# Patient Record
Sex: Male | Born: 1940 | Race: White | Hispanic: No | Marital: Married | Smoking: Current every day smoker
Health system: Southern US, Community
[De-identification: ages and names within clinical notes are randomized; demographics above are authoritative.]

## PROBLEM LIST (undated history)

## (undated) DIAGNOSIS — I2510 Atherosclerotic heart disease of native coronary artery without angina pectoris: Secondary | ICD-10-CM

## (undated) DIAGNOSIS — M79606 Pain in leg, unspecified: Secondary | ICD-10-CM

## (undated) DIAGNOSIS — H409 Unspecified glaucoma: Secondary | ICD-10-CM

## (undated) DIAGNOSIS — Z972 Presence of dental prosthetic device (complete) (partial): Secondary | ICD-10-CM

## (undated) DIAGNOSIS — J849 Interstitial pulmonary disease, unspecified: Secondary | ICD-10-CM

## (undated) DIAGNOSIS — M1990 Unspecified osteoarthritis, unspecified site: Secondary | ICD-10-CM

## (undated) DIAGNOSIS — I771 Stricture of artery: Secondary | ICD-10-CM

## (undated) DIAGNOSIS — J189 Pneumonia, unspecified organism: Secondary | ICD-10-CM

## (undated) DIAGNOSIS — H5440 Blindness, one eye, unspecified eye: Secondary | ICD-10-CM

## (undated) DIAGNOSIS — M5126 Other intervertebral disc displacement, lumbar region: Secondary | ICD-10-CM

## (undated) DIAGNOSIS — M51369 Other intervertebral disc degeneration, lumbar region without mention of lumbar back pain or lower extremity pain: Secondary | ICD-10-CM

## (undated) DIAGNOSIS — E785 Hyperlipidemia, unspecified: Secondary | ICD-10-CM

## (undated) DIAGNOSIS — I714 Abdominal aortic aneurysm, without rupture: Secondary | ICD-10-CM

## (undated) DIAGNOSIS — R0601 Orthopnea: Secondary | ICD-10-CM

## (undated) DIAGNOSIS — J449 Chronic obstructive pulmonary disease, unspecified: Secondary | ICD-10-CM

## (undated) DIAGNOSIS — C4490 Unspecified malignant neoplasm of skin, unspecified: Secondary | ICD-10-CM

## (undated) DIAGNOSIS — F419 Anxiety disorder, unspecified: Secondary | ICD-10-CM

## (undated) DIAGNOSIS — J302 Other seasonal allergic rhinitis: Secondary | ICD-10-CM

## (undated) DIAGNOSIS — N179 Acute kidney failure, unspecified: Secondary | ICD-10-CM

## (undated) DIAGNOSIS — R0600 Dyspnea, unspecified: Secondary | ICD-10-CM

## (undated) DIAGNOSIS — Z87442 Personal history of urinary calculi: Secondary | ICD-10-CM

## (undated) DIAGNOSIS — Z8719 Personal history of other diseases of the digestive system: Secondary | ICD-10-CM

## (undated) DIAGNOSIS — K7689 Other specified diseases of liver: Secondary | ICD-10-CM

## (undated) DIAGNOSIS — N201 Calculus of ureter: Secondary | ICD-10-CM

## (undated) DIAGNOSIS — Z720 Tobacco use: Secondary | ICD-10-CM

## (undated) DIAGNOSIS — R911 Solitary pulmonary nodule: Secondary | ICD-10-CM

## (undated) DIAGNOSIS — N3289 Other specified disorders of bladder: Secondary | ICD-10-CM

## (undated) DIAGNOSIS — M5136 Other intervertebral disc degeneration, lumbar region: Secondary | ICD-10-CM

## (undated) DIAGNOSIS — Z973 Presence of spectacles and contact lenses: Secondary | ICD-10-CM

## (undated) HISTORY — DX: Pain in leg, unspecified: M79.606

## (undated) HISTORY — PX: TONSILLECTOMY: SUR1361

## (undated) HISTORY — PX: CATARACT EXTRACTION W/ INTRAOCULAR LENS  IMPLANT, BILATERAL: SHX1307

## (undated) HISTORY — DX: Stricture of artery: I77.1

## (undated) HISTORY — DX: Chronic obstructive pulmonary disease, unspecified: J44.9

## (undated) HISTORY — DX: Anxiety disorder, unspecified: F41.9

## (undated) HISTORY — PX: HERNIA REPAIR: SHX51

## (undated) HISTORY — DX: Hyperlipidemia, unspecified: E78.5

---

## 1997-10-28 ENCOUNTER — Ambulatory Visit (HOSPITAL_COMMUNITY): Admission: RE | Admit: 1997-10-28 | Discharge: 1997-10-28 | Payer: Self-pay | Admitting: *Deleted

## 1997-11-10 ENCOUNTER — Ambulatory Visit (HOSPITAL_COMMUNITY): Admission: RE | Admit: 1997-11-10 | Discharge: 1997-11-10 | Payer: Self-pay | Admitting: *Deleted

## 2000-11-08 ENCOUNTER — Encounter: Payer: Self-pay | Admitting: Family Medicine

## 2000-11-08 ENCOUNTER — Ambulatory Visit (HOSPITAL_COMMUNITY): Admission: RE | Admit: 2000-11-08 | Discharge: 2000-11-08 | Payer: Self-pay | Admitting: Family Medicine

## 2000-11-09 ENCOUNTER — Ambulatory Visit (HOSPITAL_COMMUNITY): Admission: EM | Admit: 2000-11-09 | Discharge: 2000-11-09 | Payer: Self-pay | Admitting: Emergency Medicine

## 2000-11-09 ENCOUNTER — Encounter: Payer: Self-pay | Admitting: Emergency Medicine

## 2000-11-15 ENCOUNTER — Ambulatory Visit (HOSPITAL_COMMUNITY): Admission: RE | Admit: 2000-11-15 | Discharge: 2000-11-15 | Payer: Self-pay | Admitting: Urology

## 2000-11-15 ENCOUNTER — Encounter: Payer: Self-pay | Admitting: Urology

## 2000-11-20 ENCOUNTER — Encounter: Payer: Self-pay | Admitting: Family Medicine

## 2000-11-20 ENCOUNTER — Ambulatory Visit (HOSPITAL_COMMUNITY): Admission: RE | Admit: 2000-11-20 | Discharge: 2000-11-20 | Payer: Self-pay | Admitting: Family Medicine

## 2000-11-30 ENCOUNTER — Ambulatory Visit (HOSPITAL_COMMUNITY): Admission: RE | Admit: 2000-11-30 | Discharge: 2000-11-30 | Payer: Self-pay | Admitting: Urology

## 2000-11-30 ENCOUNTER — Encounter: Payer: Self-pay | Admitting: Urology

## 2000-11-30 HISTORY — PX: OTHER SURGICAL HISTORY: SHX169

## 2000-12-06 ENCOUNTER — Ambulatory Visit (HOSPITAL_COMMUNITY): Admission: RE | Admit: 2000-12-06 | Discharge: 2000-12-06 | Payer: Self-pay | Admitting: Urology

## 2000-12-06 ENCOUNTER — Encounter: Payer: Self-pay | Admitting: Urology

## 2000-12-10 ENCOUNTER — Inpatient Hospital Stay (HOSPITAL_COMMUNITY): Admission: AD | Admit: 2000-12-10 | Discharge: 2000-12-12 | Payer: Self-pay | Admitting: Urology

## 2000-12-31 ENCOUNTER — Ambulatory Visit (HOSPITAL_BASED_OUTPATIENT_CLINIC_OR_DEPARTMENT_OTHER): Admission: RE | Admit: 2000-12-31 | Discharge: 2000-12-31 | Payer: Self-pay | Admitting: *Deleted

## 2000-12-31 HISTORY — PX: OTHER SURGICAL HISTORY: SHX169

## 2007-04-04 HISTORY — PX: URETEROLITHOTOMY: SHX71

## 2007-07-29 IMAGING — CT CT HEAD W/O CM - CT MAXILLOFACIAL W/O CM
3 series · 17 of 47 positions shown, 20 images · non-contrast
Comparison: NONE

CLINICAL DATA: Congestion.  Evaluate for sinusitis. 

CT PARANASAL SINUSES
TECHNIQUE: Axial, sagittal, and coronal images were obtained.

[Series 3: 3x3 · axial · 0.35mm/px · z∈[+664,+778]mm · 11 of 46 slices shown, 14 images]
[im 4/46  brain]
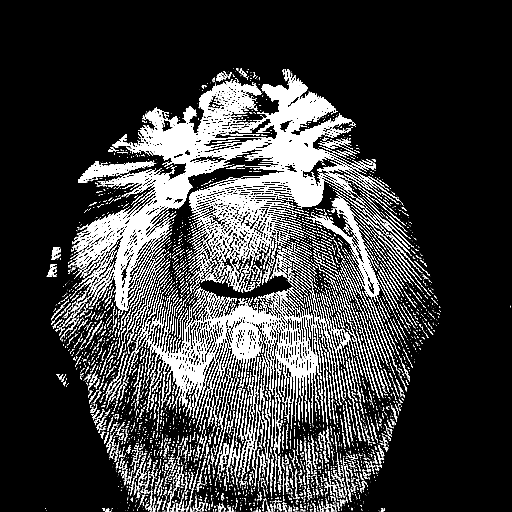
[im 4/46  bone]
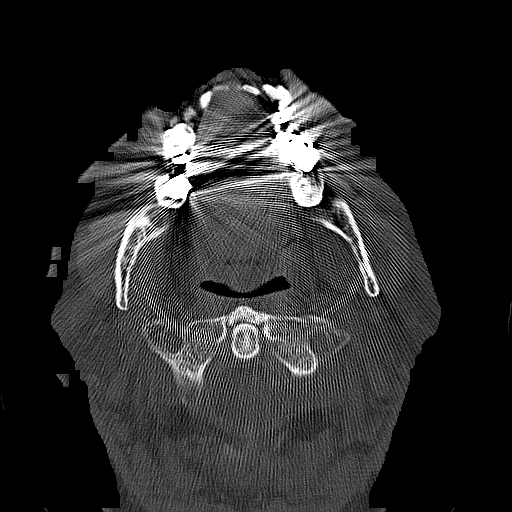
[im 7/46  bone]
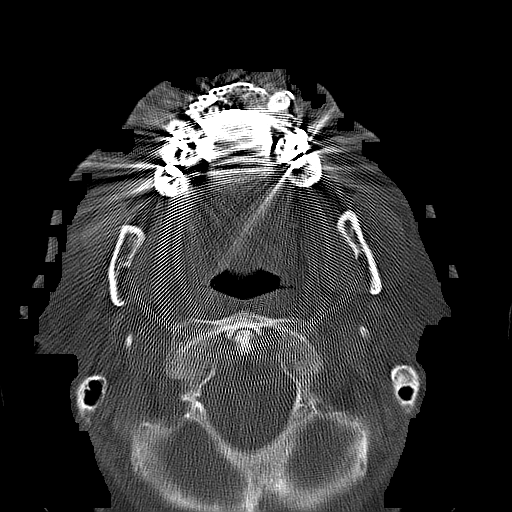
[im 11/46  bone]
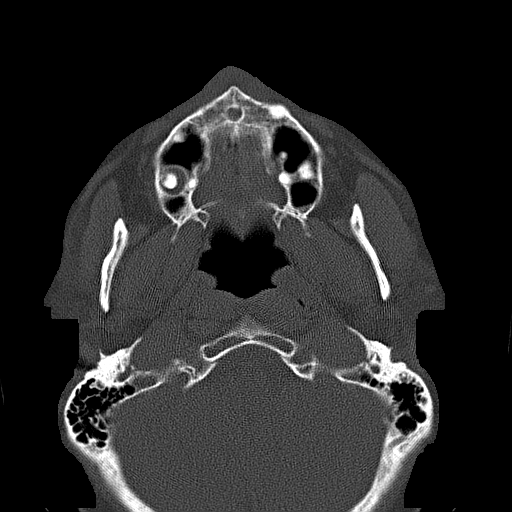
[im 14/46  bone]
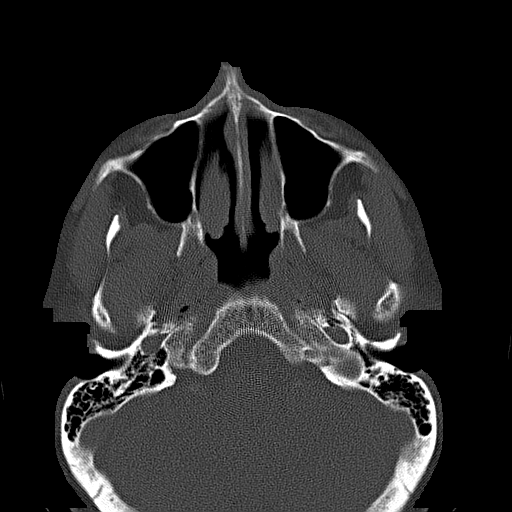
[im 19/46  brain]
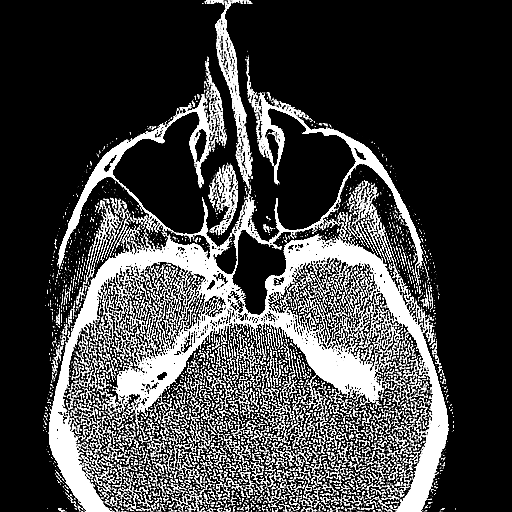
[im 19/46  bone]
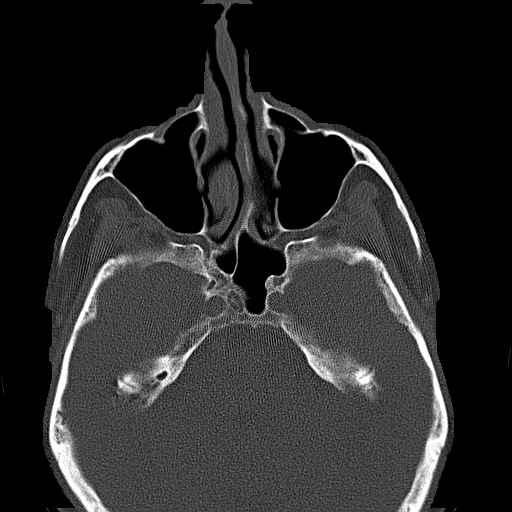
[im 22/46  bone]
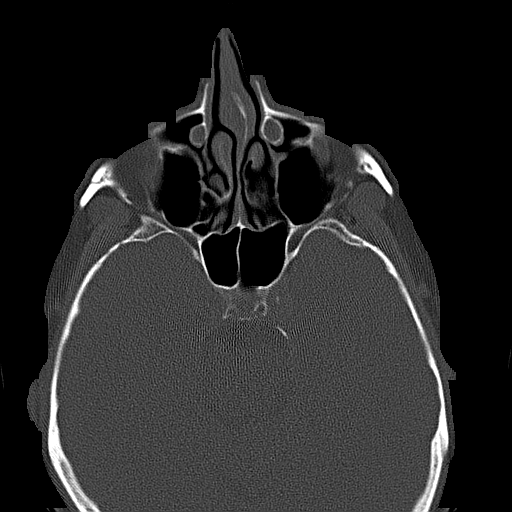
[im 25/46  bone]
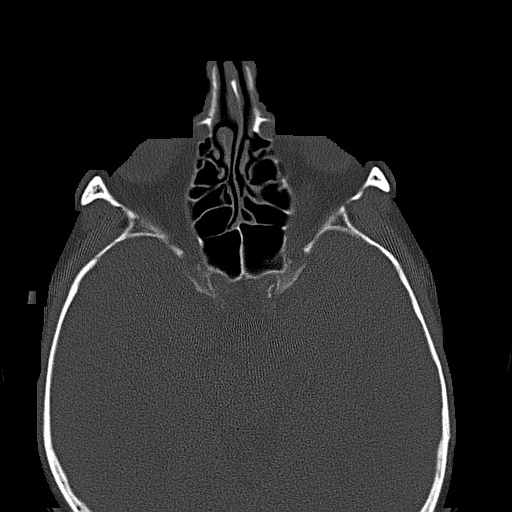
[im 30/46  bone]
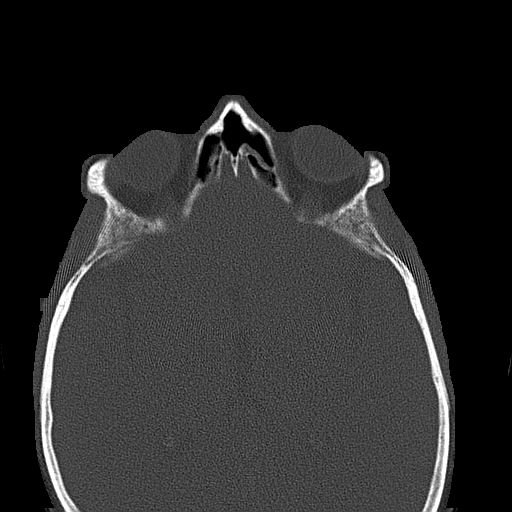
[im 33/46  brain]
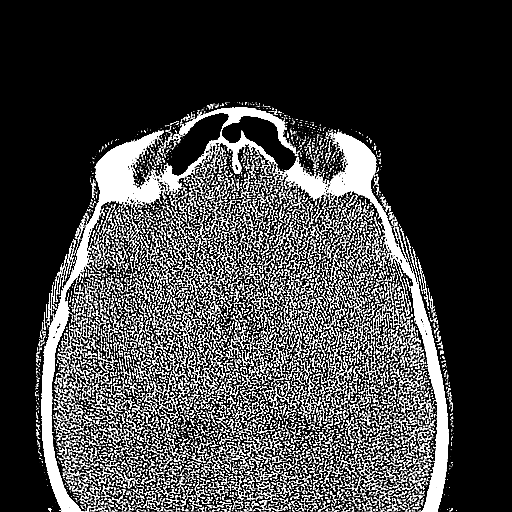
[im 33/46  bone]
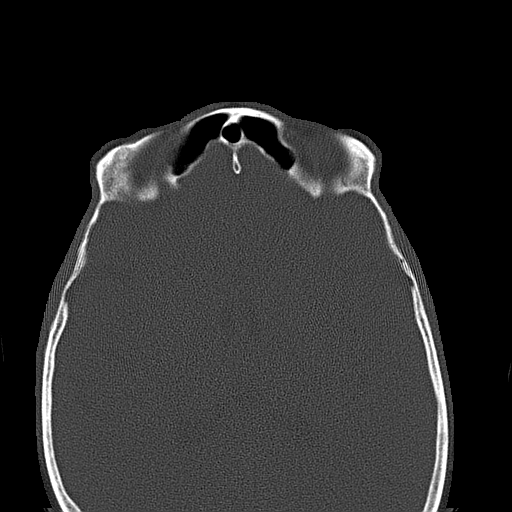
[im 38/46  bone]
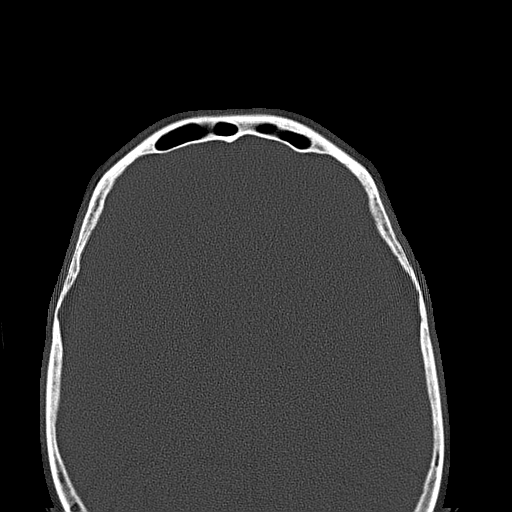
[im 42/46  bone]
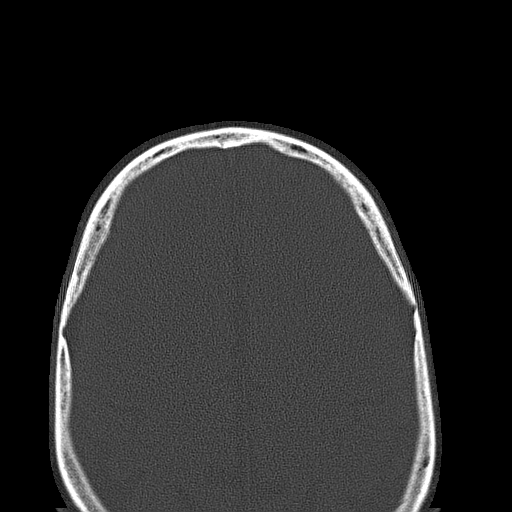

[coronals · coronal · 0.35mm/px · 3 of 28 slices shown]
[im 10/28  bone]
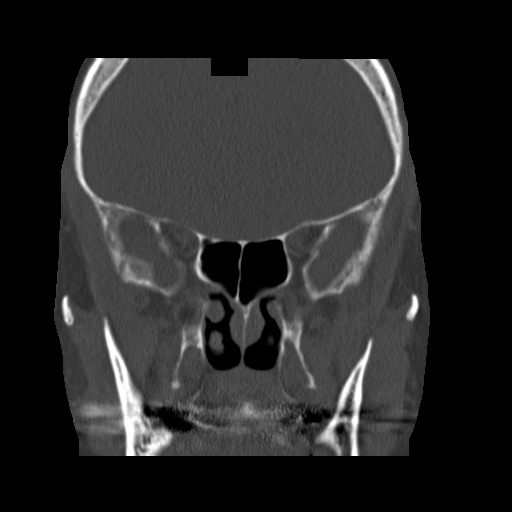
[im 13/28  bone]
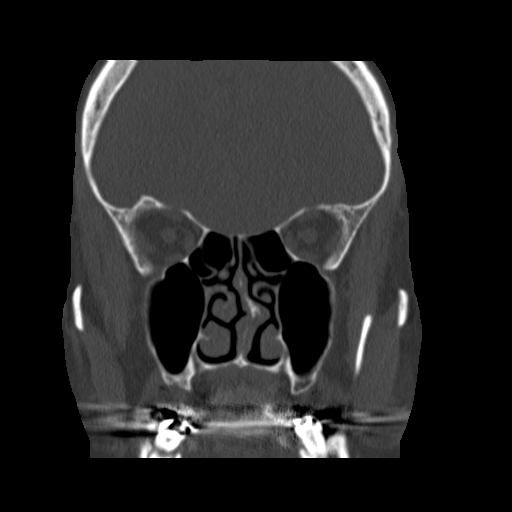
[im 16/28  bone]
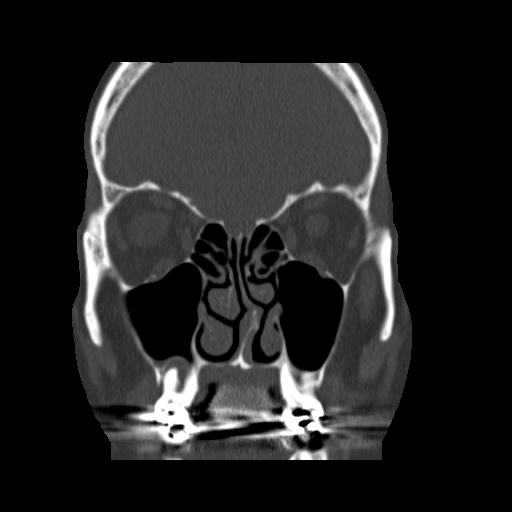

[sagittals · sagittal · 0.35mm/px · 3 of 33 slices shown]
[im 11/33  bone]
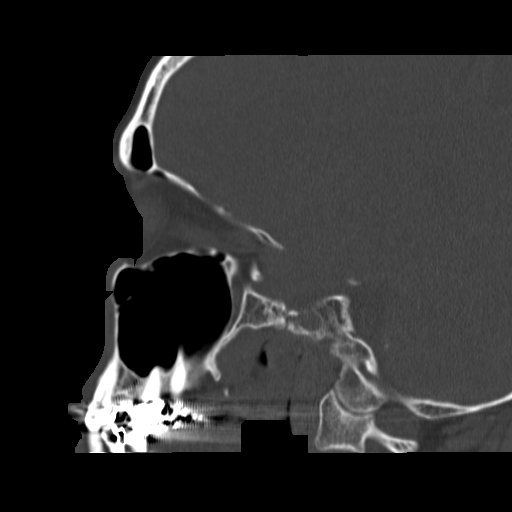
[im 17/33  bone]
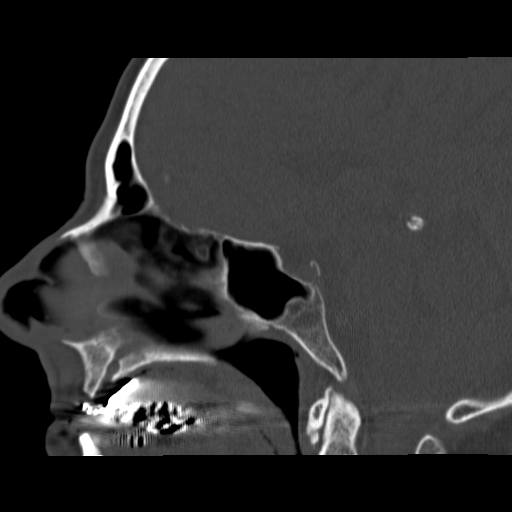
[im 22/33  bone]
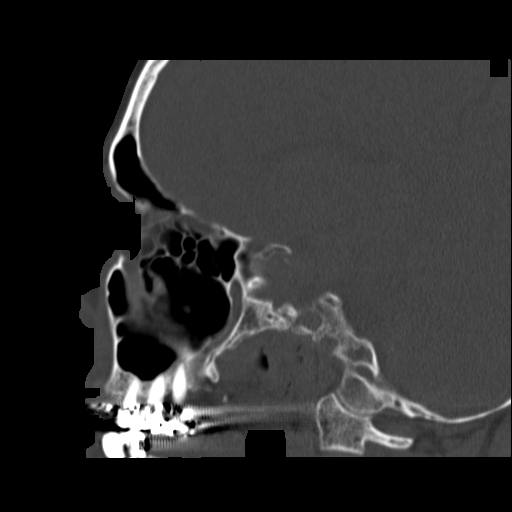

[17 of 47 positions shown; findings below may reference images not displayed]

FINDINGS: There is a small dentigerous cyst in the right 
maxillary sinus arising at the level of the 3rd molar.  This 
measures approximately 8-9 mm in size. There is no evidence of 
mucosal thickening or air-fluid level in the paranasal sinuses.  
The orbits and mastoids are unremarkable.  There is no evidence of 
nasopharyngeal mass. There is leftward nasal septal deviation 
without occlusion or polyposis.
IMPRESSION: No evidence of sinusitis.  Small dentigerous cyst on 
the right at the level of the 3rd molar. ANICA 
electronically reviewed on [DATE] Dict Date: [DATE]  Tran 
Date:  [DATE] DAS  JLM

## 2008-06-23 ENCOUNTER — Ambulatory Visit: Payer: Self-pay | Admitting: Urology

## 2008-06-23 IMAGING — CT CT ABD-PELV W/O CM
1 of 2 series · 14 of 32 positions shown, 18 images · non-contrast
Comparison: none

REASON FOR EXAM: hematuria
COMMENTS:

[Series 2: soft tissue · axial · 0.67mm/px · z∈[-1285,-898]mm · 14 of 141 slices shown, 18 images]
[im 6/141  soft-tissue]
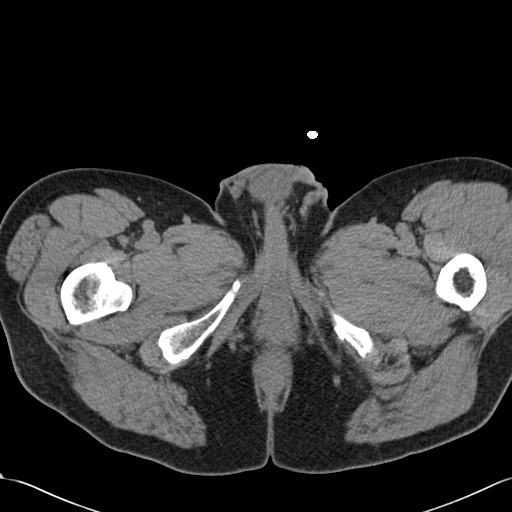
[im 6/141  bone]
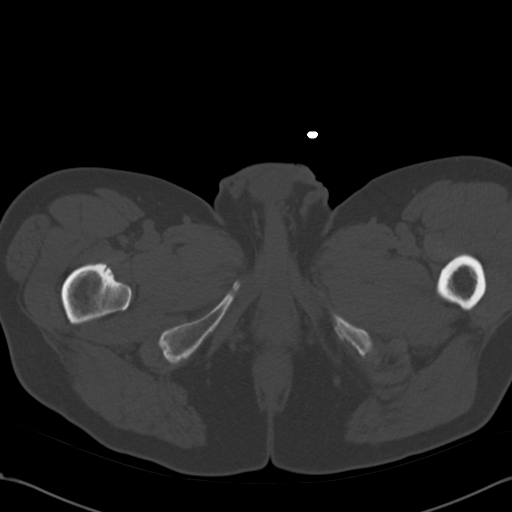
[im 18/141  soft-tissue]
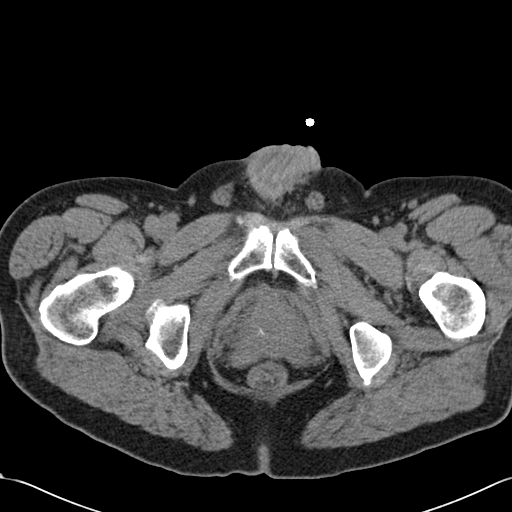
[im 30/141  soft-tissue]
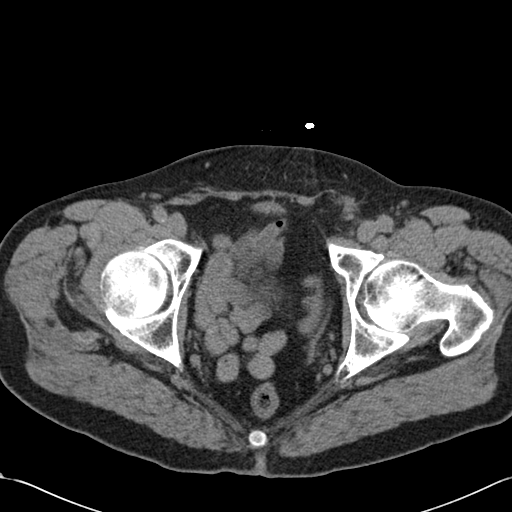
[im 41/141  soft-tissue]
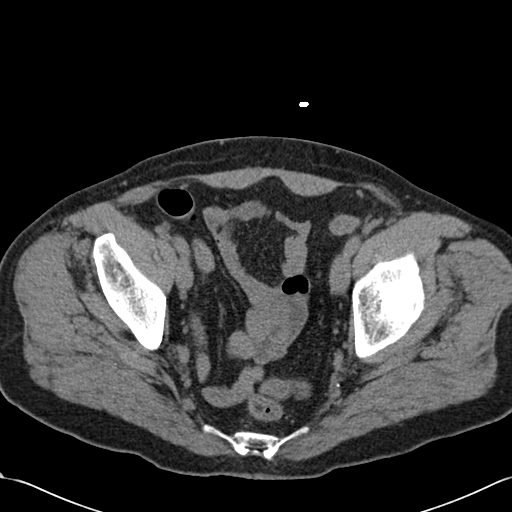
[im 53/141  soft-tissue]
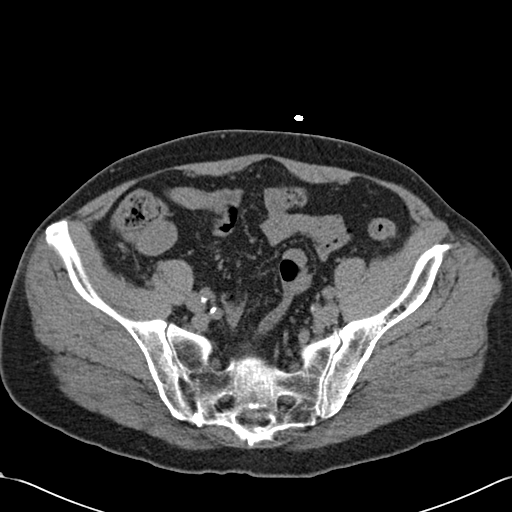
[im 65/141  soft-tissue]
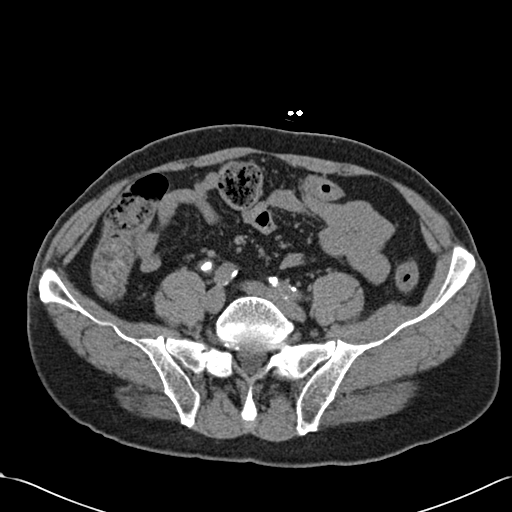
[im 76/141  soft-tissue]
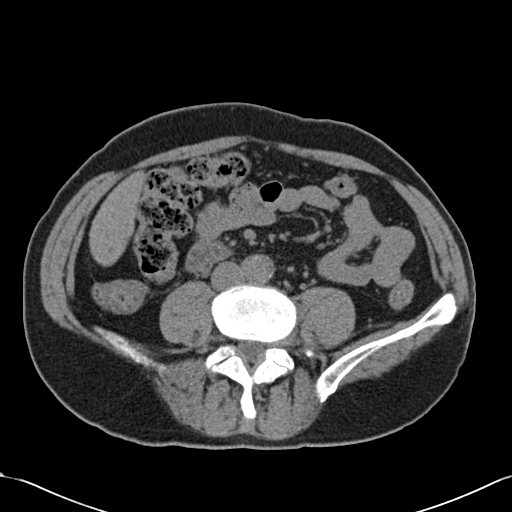
[im 88/141  soft-tissue]
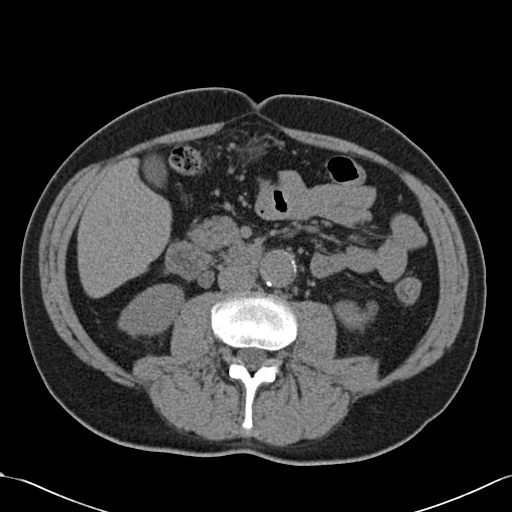
[im 100/141  soft-tissue]
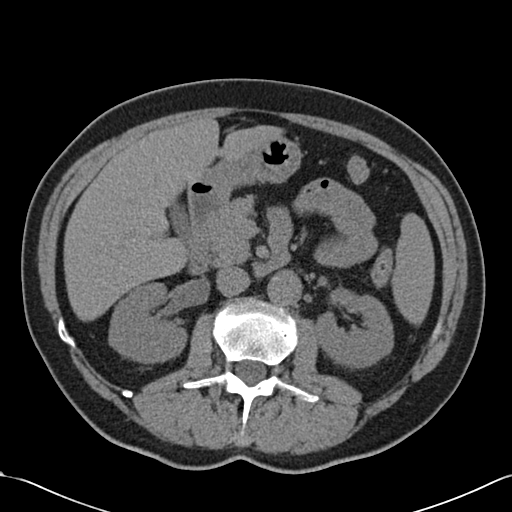
[im 100/141  bone]
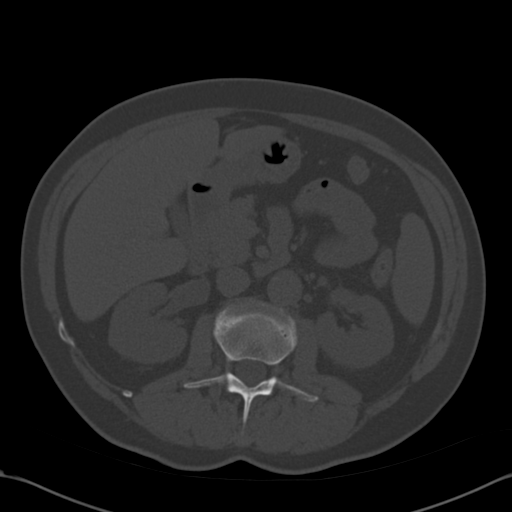
[im 111/141  soft-tissue]
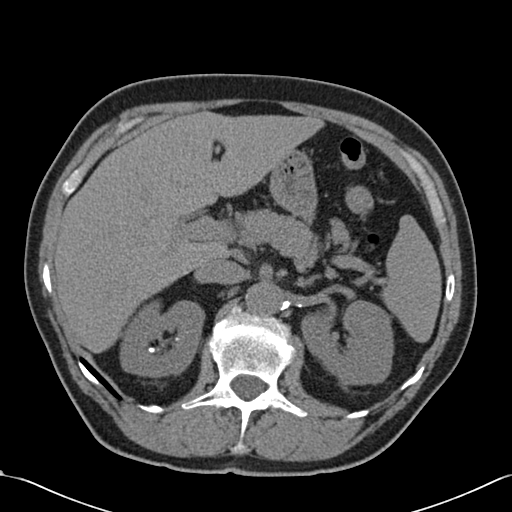
[im 117/141  lung]
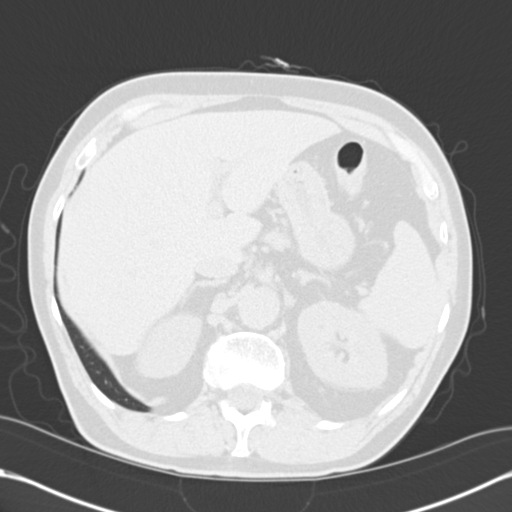
[im 123/141  soft-tissue]
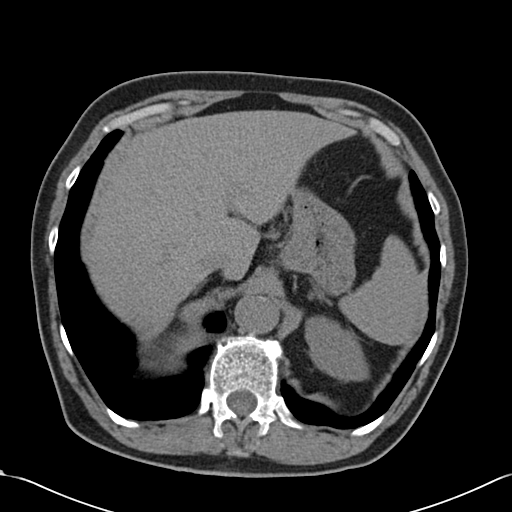
[im 123/141  lung]
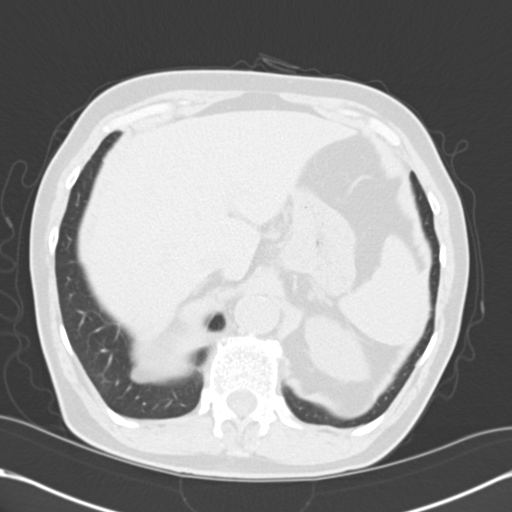
[im 129/141  lung]
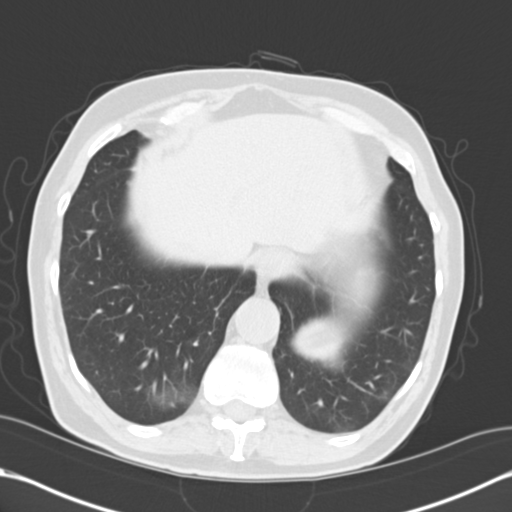
[im 135/141  soft-tissue]
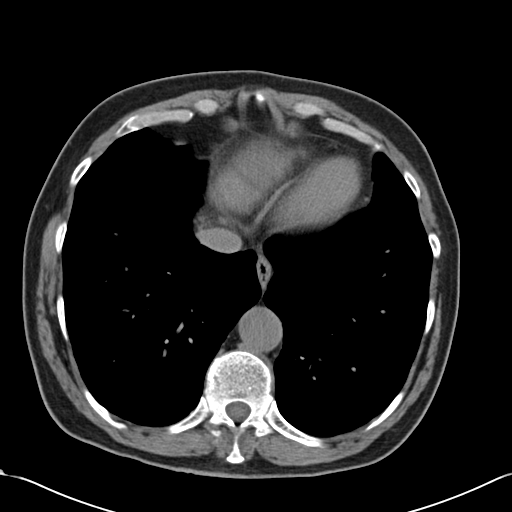
[im 135/141  lung]
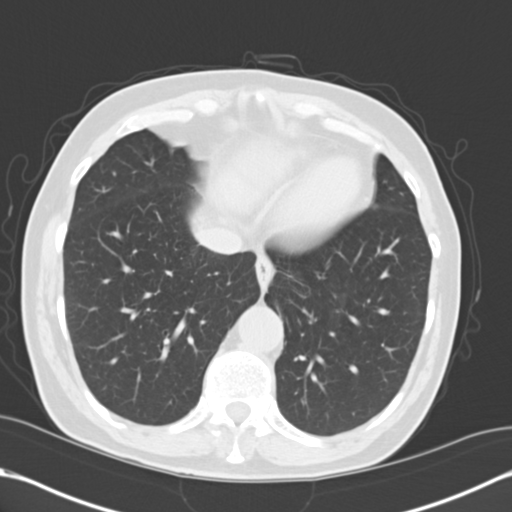

[14 of 32 positions shown; findings below may reference images not displayed]

PROCEDURE:     CT  - CT ABDOMEN AND PELVIS W[DATE] [DATE]

RESULT:     Axial CT scanning was performed through the abdomen and pelvis
at 3 mm intervals and slice thicknesses without contrast. Review of
3-dimensional reconstructed images was performed separately on the [employer]
Server monitor.

There are calcified stones within both kidneys. On the right there are 3
stones in an upper pole calyx measuring between 2 and 3 mm in diameter. In a
lower pole calyx there is an approximately 1 mm diameter stone. On the left
there are 2 calcifications in a posterior medial mid pole calyx with the
largest measuring approximately 3 mm in diameter and the smaller measuring 1
mm in diameter or less. In a lower pole calyx there are 2 stones measuring
less than 1 mm in diameter.The perinephric fat is normal in appearance.

Along the expected course of the ureters I do not see abnormal
calcifications on the left but on the right there is a midureteral stone
measuring as much is 8 mm in diameter demonstrated best on image 79. This is
not producing high-grade obstruction. I cannot absolutely exclude additional
smaller stones more inferiorly positioned but there are nearby arterial
calcifications in the internal and external iliac branches. The prostate
gland is mildly enlarged and produces a mild impression upon the urinary
bladder base.

There is a small left inguinal hernia containing only fat. The unopacified
loops of small and large bowel are normal in appearance. The liver,
gallbladder, pancreas, spleen, and adrenal glands exhibit no acute
abnormality. There is failure to taper of the caliber of the abdominal aorta
with maximal infrarenal diameter of 2.6 cm. The stomach is nondistended. The
lung bases are clear. The lumbar vertebral bodies are preserved in height.
IMPRESSION: 1. There is an 8 mm diameter stone in the mid ureter on the right not
producing significant obstruction. There are smaller stones present in both
kidneys. There is mild enlargement of the prostate gland.
2. There is mild failure to taper of the caliber of the abdominal aorta with
maximal measured infrarenal diameter of 2.6 centimeters.
3. I do not see evidence of acute hepatobiliary abnormality nor acute bowel
abnormality.

## 2008-08-12 ENCOUNTER — Ambulatory Visit: Payer: Self-pay | Admitting: Urology

## 2008-08-12 IMAGING — CR DG ABDOMEN 1V
1 series · 1 of 1 positions shown · non-contrast
Comparison: none

REASON FOR EXAM: NEPHROLITHIAISIS PT NEED FILMS
COMMENTS:

[view not recorded]
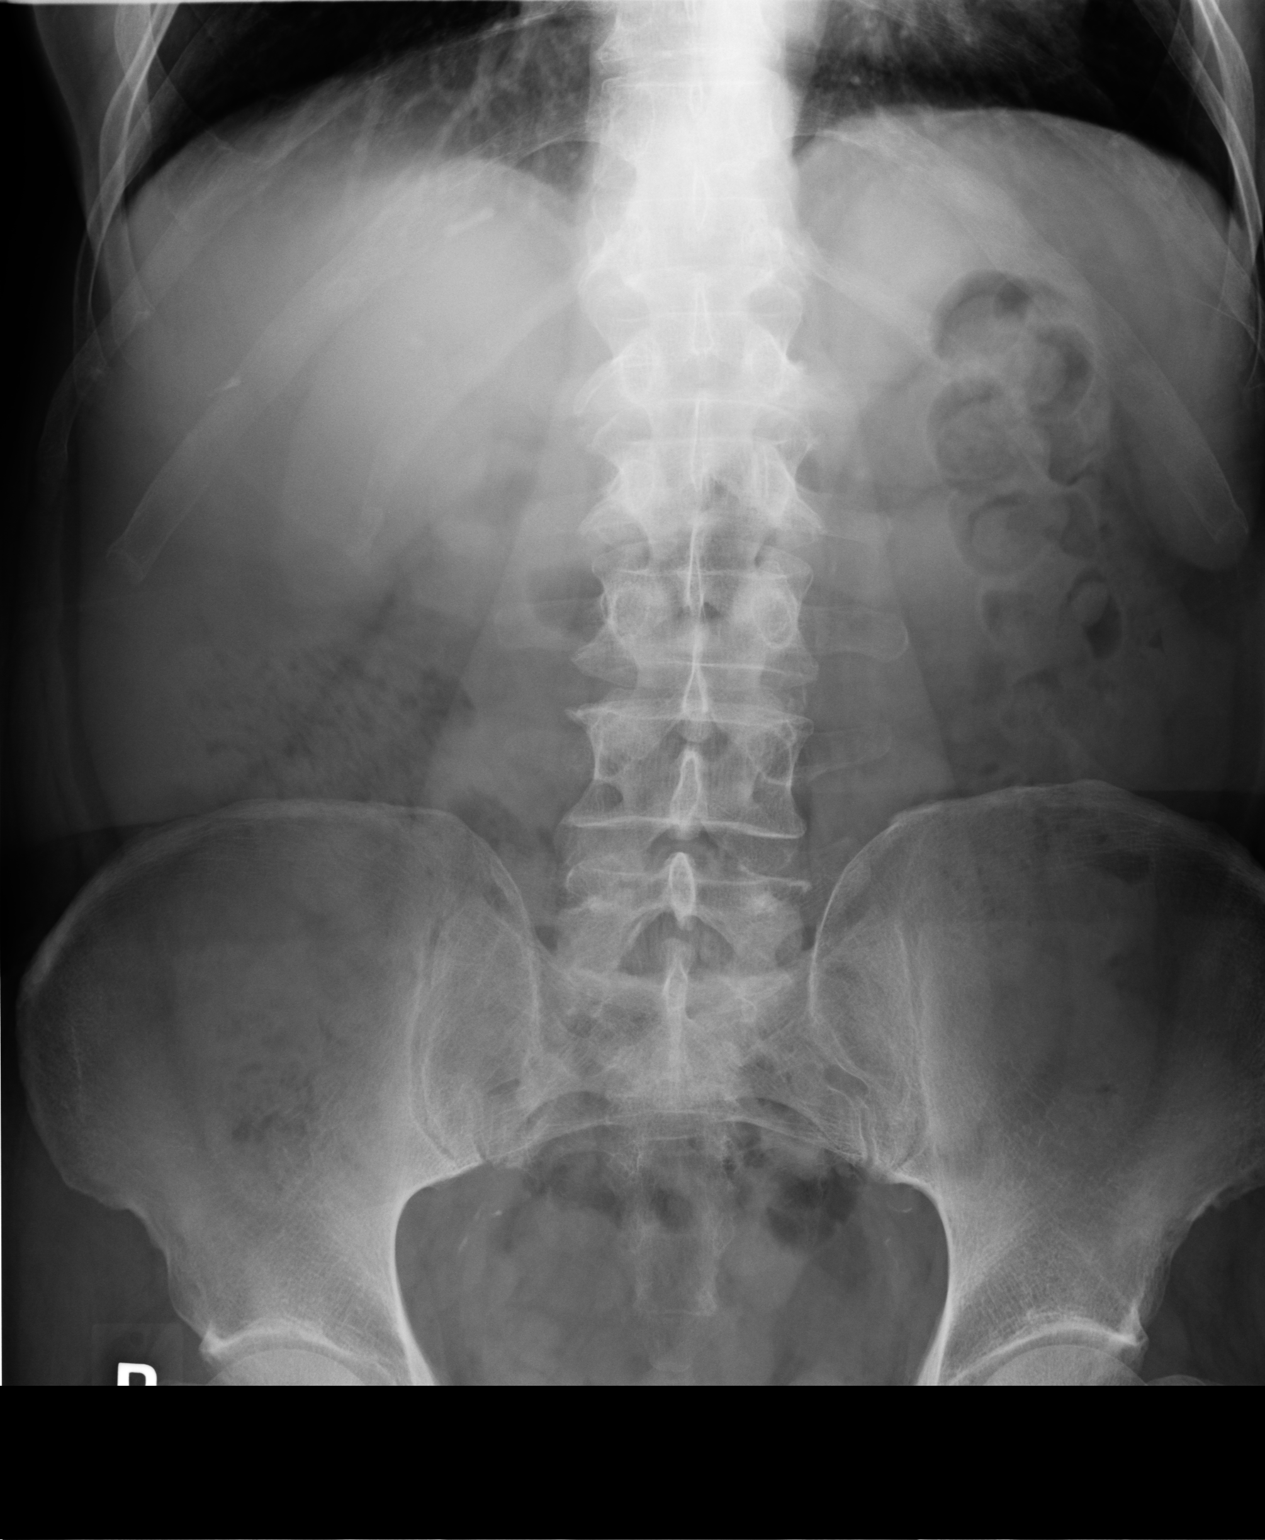

[1 of 1 positions shown; findings below may reference images not displayed]

PROCEDURE:     DXR - DXR KIDNEY URETER BLADDER  - [DATE] [DATE]

RESULT:     Frontal view of the abdomen and pelvis is obtained.

A moderate amount of stool is appreciated within the colon. A vague,
calcified density projects in the region of the mid to upper pole area of
the right renal fossa measuring approximately 4 mm in diameter. A second
area of calcified density projects within the right base of the true pelvis
laterally. No further calcified densities project in the expected course of
the right and left urinary collecting system. The visualized bony skeleton
demonstrates no evidence of fracture or dislocation.
IMPRESSION: 1.     Non-obstructive bowel gas pattern with a moderate amount of stool.
2.     Findings suspicious for right renal calculus.
3.     Phlebolith versus distal ureteral calculus on the right as described
above.

## 2008-08-19 ENCOUNTER — Ambulatory Visit: Payer: Self-pay | Admitting: Urology

## 2008-08-25 ENCOUNTER — Ambulatory Visit: Payer: Self-pay | Admitting: Urology

## 2008-08-25 IMAGING — CR DG ABDOMEN 1V
1 series · 1 of 1 positions shown · non-contrast
Comparison: none

REASON FOR EXAM: Nephrolithiasis
COMMENTS:

[view not recorded]
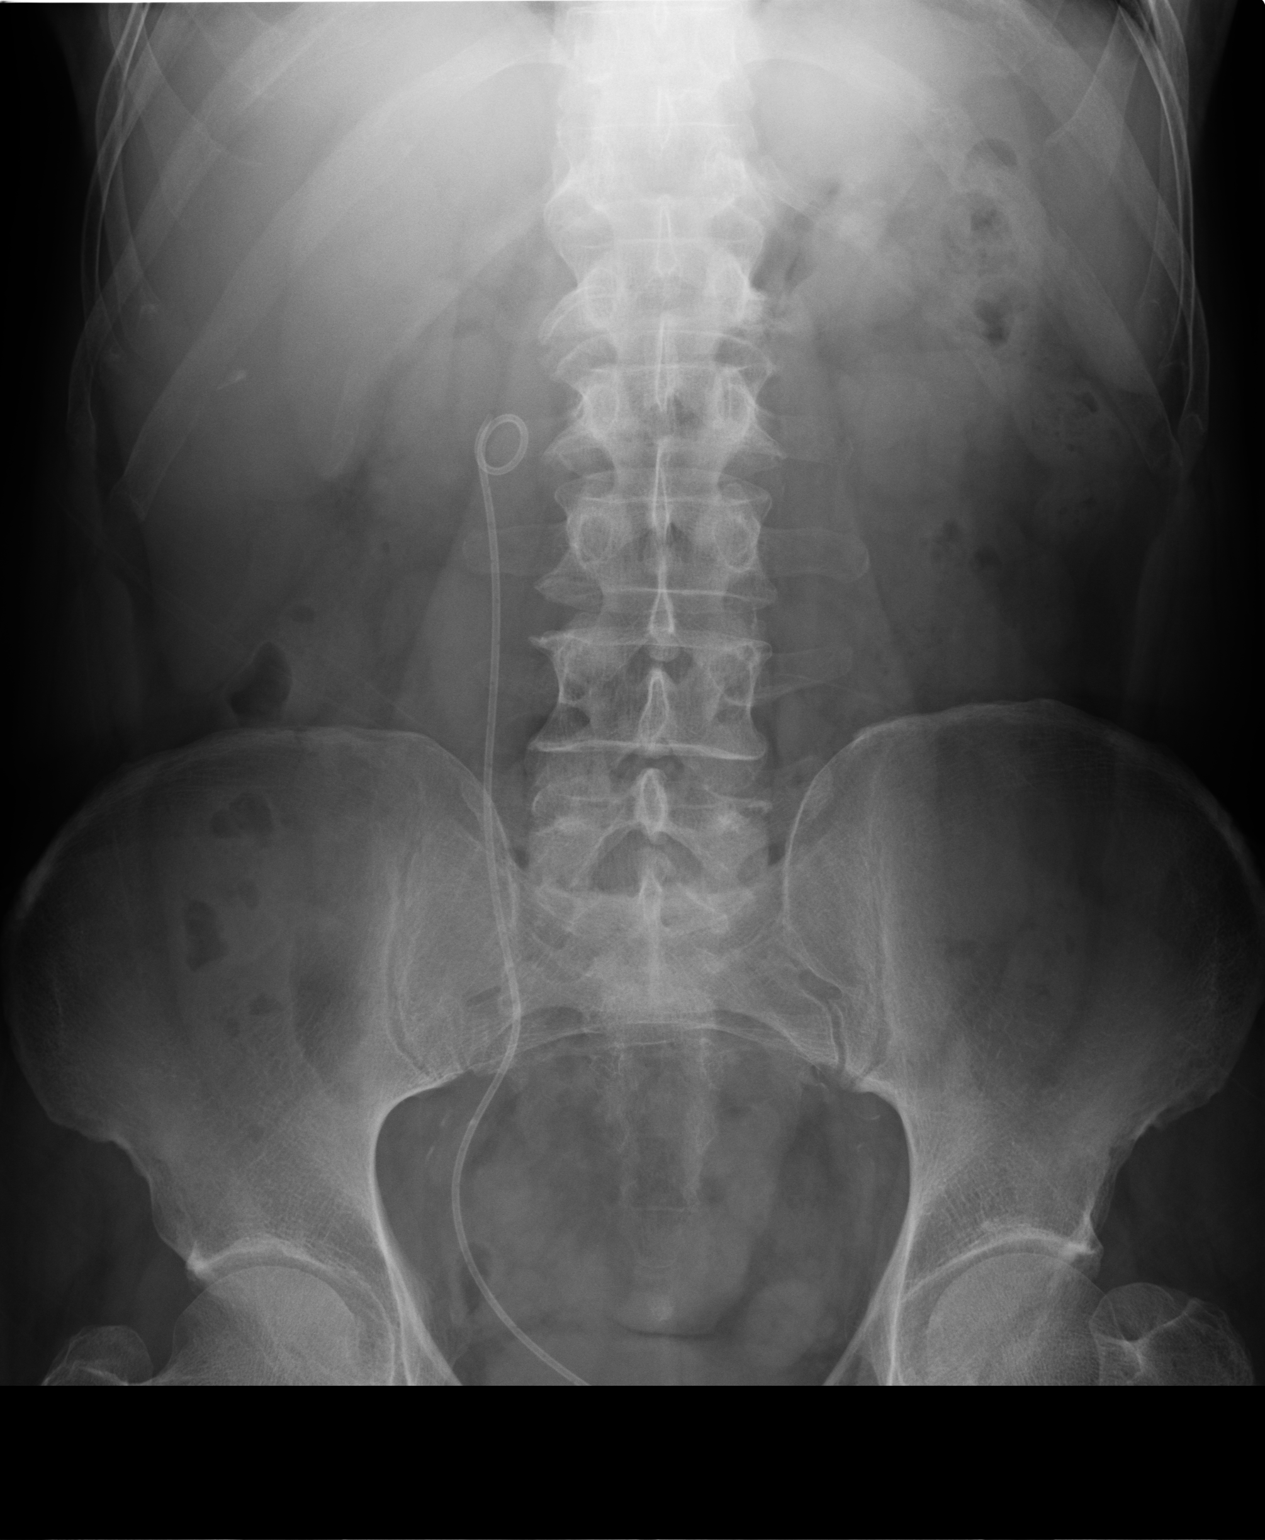

[1 of 1 positions shown; findings below may reference images not displayed]

PROCEDURE:     DXR - DXR KIDNEY URETER BLADDER  - [DATE]  [DATE]

RESULT:     Comparison is made to the prior exam of [DATE].

There is again observed a 5 mm calcification projected over the upper pole
region of the right kidney. A right ureteral stent is present. The upper
coil is projected over the region of the upper portion of the right ureter
and the distal coil is in the urinary bladder. No stone along course of the
stent is seen. The previously present calcification at the right
ureterovesical junction is no longer seen.

On the left there are a few tiny densities visualized through stool which
overlies the kidney. The findings are not definite but could represent tiny,
left renal stones.
IMPRESSION: 1.  Right nephrolithiasis.
2.  A right ureteral stent is present. As noted above, the upper loop is
somewhat low in position and is projected over the upper right ureter. No
stone along the course of the stent is identified.
3.  Possible left nephrolithiasis.

## 2008-08-26 ENCOUNTER — Inpatient Hospital Stay (HOSPITAL_COMMUNITY): Admission: EM | Admit: 2008-08-26 | Discharge: 2008-08-30 | Payer: Self-pay | Admitting: Emergency Medicine

## 2008-08-26 IMAGING — CT CT NECK W/ CM
1 series · 12 of 14 positions shown, 15 images · IV contrast (omnipaque)
Comparison: Chest x-ray of same date.

CLINICAL DATA: Shortness of breath.  Difficulty swallowing and
breathing.

CT NECK WITH CONTRAST
TECHNIQUE: Multidetector CT imaging of the neck was performed with
intravenous contrast.
Contrast: 100 Omnipaque 300

[Series 2: neck rtn st · axial · 0.39mm/px · z∈[-333,-96]mm · 12 of 95 slices shown, 15 images]
[im 8/95  soft-tissue]
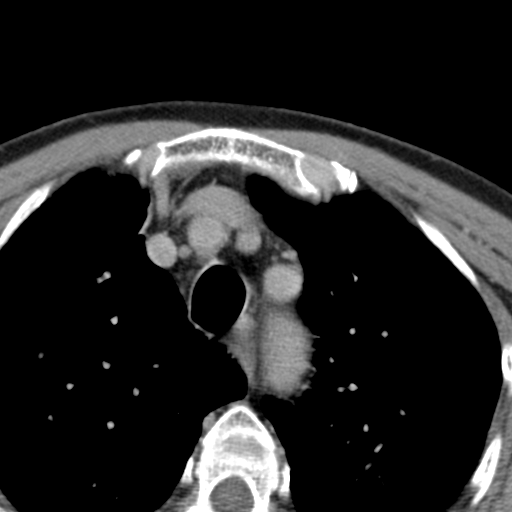
[im 8/95  bone]
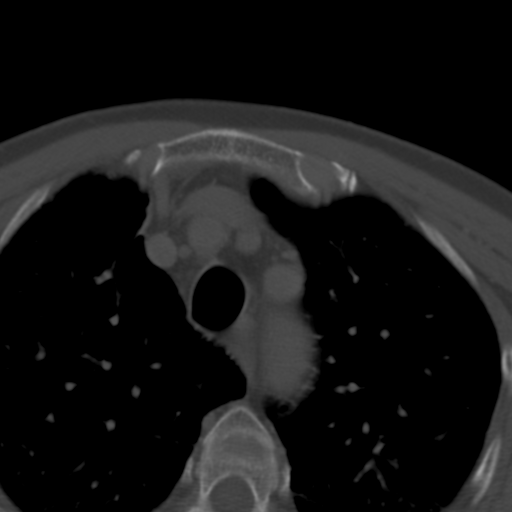
[im 15/95  bone]
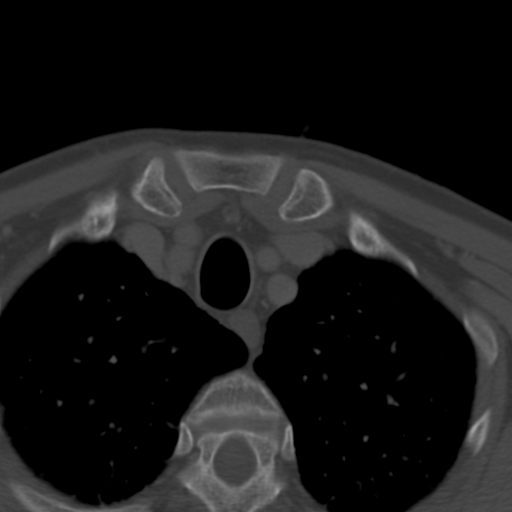
[im 22/95  bone]
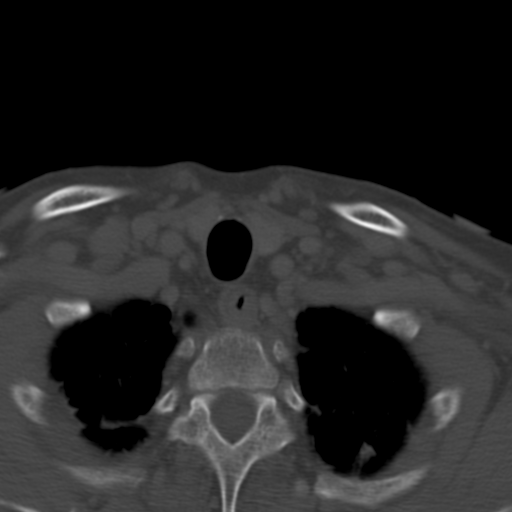
[im 29/95  bone]
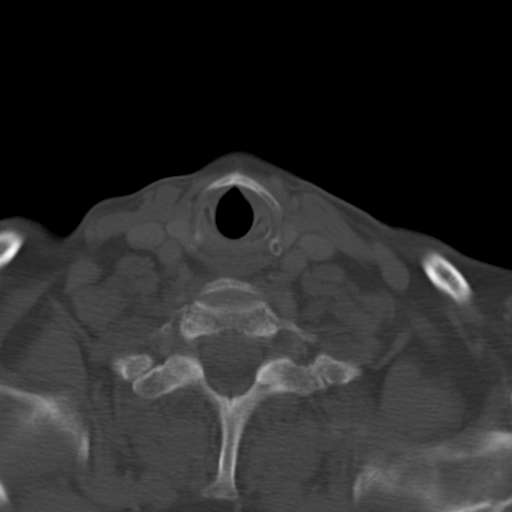
[im 37/95  soft-tissue]
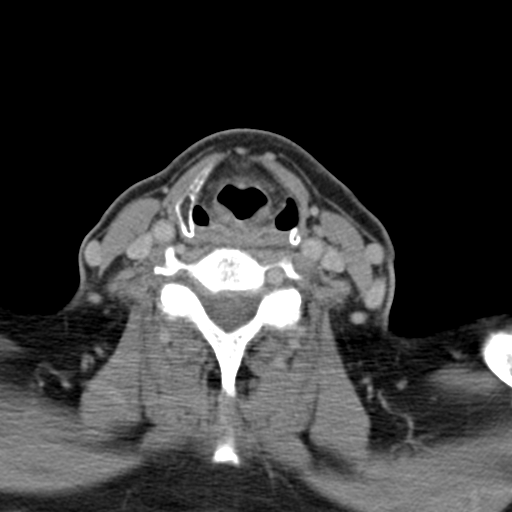
[im 37/95  bone]
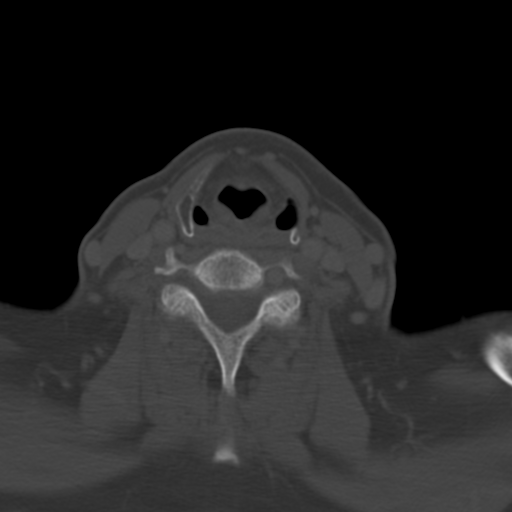
[im 44/95  bone]
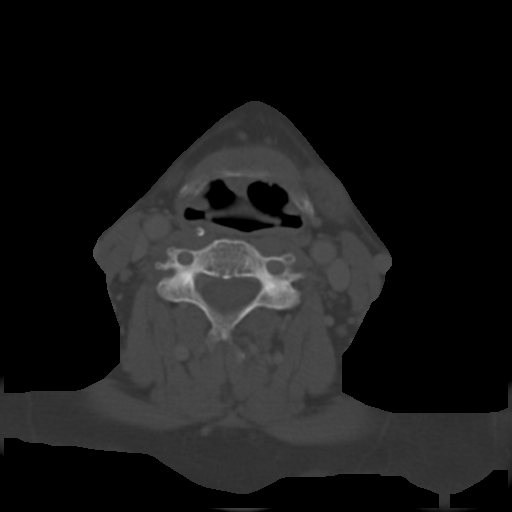
[im 51/95  bone]
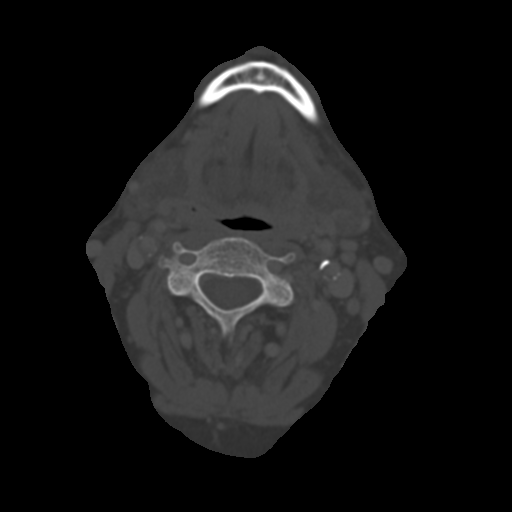
[im 58/95  bone]
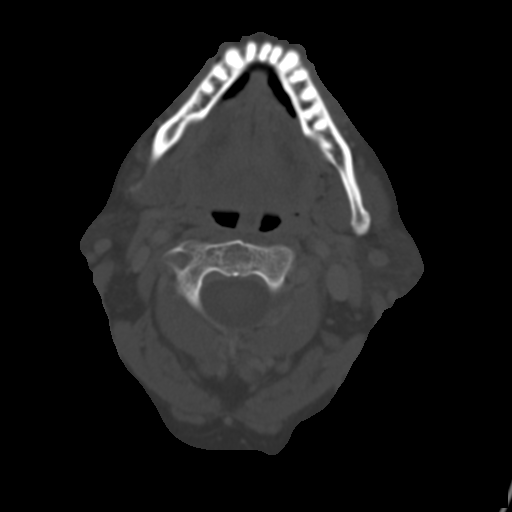
[im 66/95  soft-tissue]
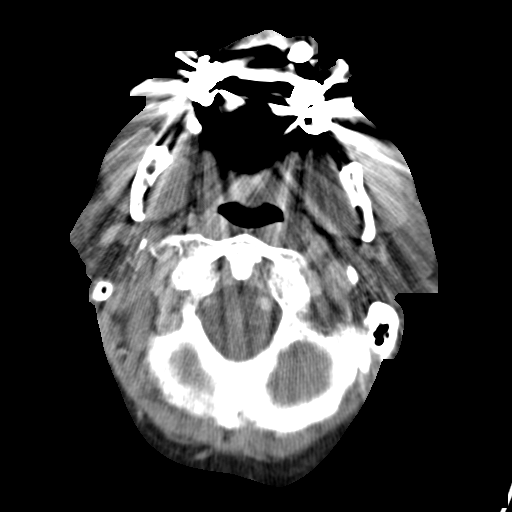
[im 66/95  bone]
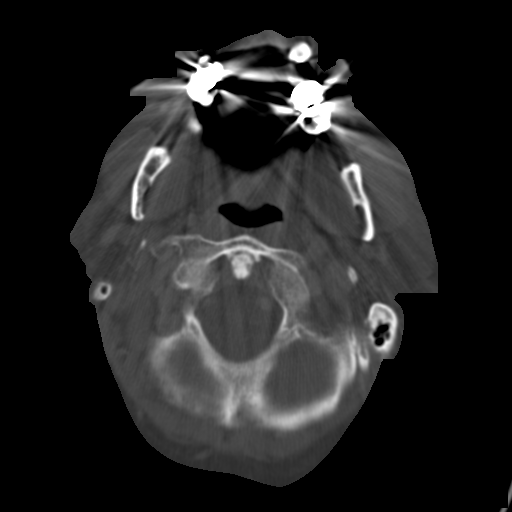
[im 73/95  bone]
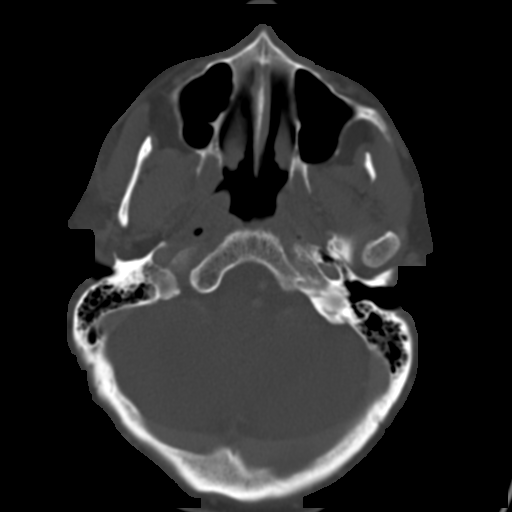
[im 80/95  bone]
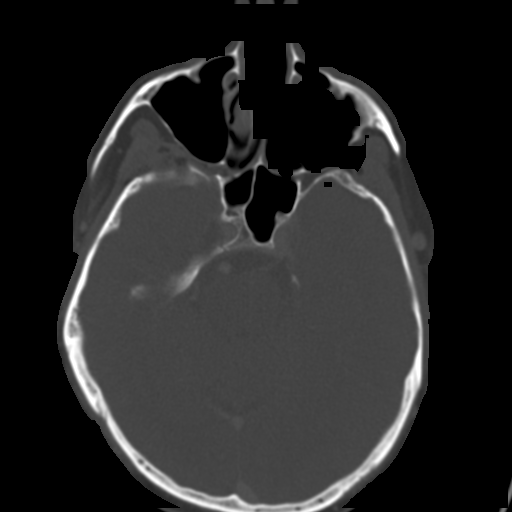
[im 87/95  bone]
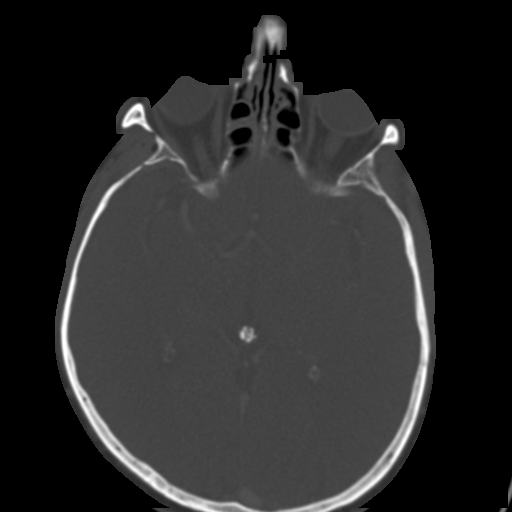

[12 of 14 positions shown; findings below may reference images not displayed]

FINDINGS: Limited imaging of the brain is unremarkable.  There are
no significant mucosal or submucosal lesions.  There is slight
asymmetry of enhancement in the left tongue base extending to the
vallecula and along the glossotonsillar sulcus.  No definite mass
is seen.  The parapharyngeal fat is clean.  This may represent
coapted mucosal tissue which is better aerated on the right side.
No other intrinsic lesions are present within the tongue.  The
vocal cords are within normal limits bilaterally.

Emphysematous changes are noted at the lung apices bilaterally.  An
11 mm nodule is present in the right upper lobe.  There is minimal
scarring at the apices bilaterally.

A right level II lymph node measures 18 x 9 mm with benign
features.  No other enlarged nodes are present.  Atherosclerotic
calcifications are noted at the carotid bifurcations bilaterally
without focal stenosis.  Bone windows are unremarkable.
IMPRESSION: 1.  No focal mucosal or submucosal lesion to explain the patient's
symptoms.
2.  Slight asymmetric enhancement at the left tongue base and
glossotonsillar sulcus is likely within normal limits.  This area
should be amendable to direct visualization.
3.  11 mm right apical lung nodule is worrisome for neoplasm.

Critical test results telephoned to NALA, PA at the time of

## 2008-08-26 IMAGING — CR DG CHEST 2V
2 series · 2 of 2 positions shown · non-contrast
Comparison: None

CLINICAL DATA: Shortness of breath, fever.

CHEST - 2 VIEW

[w chest pa]
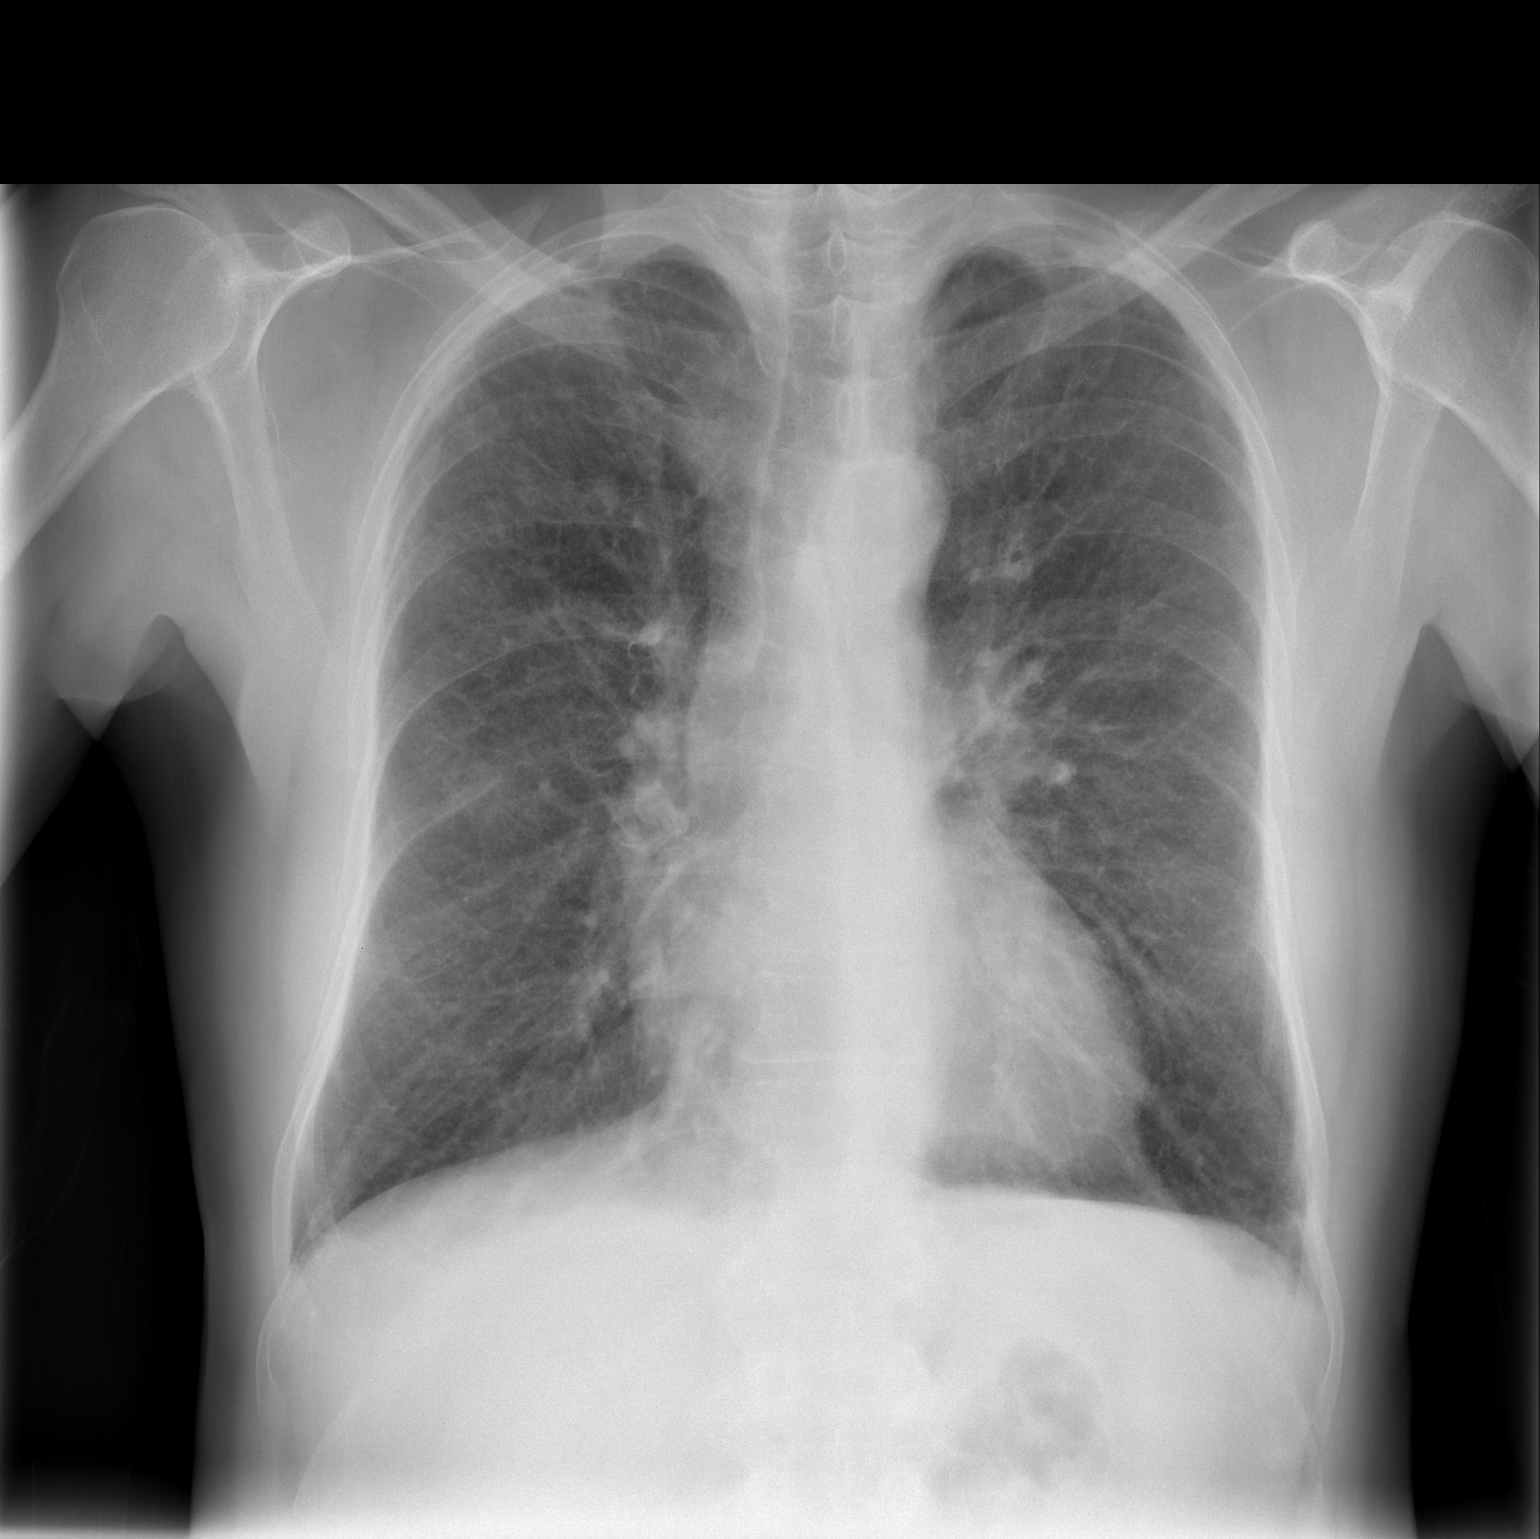

[w chest lat]
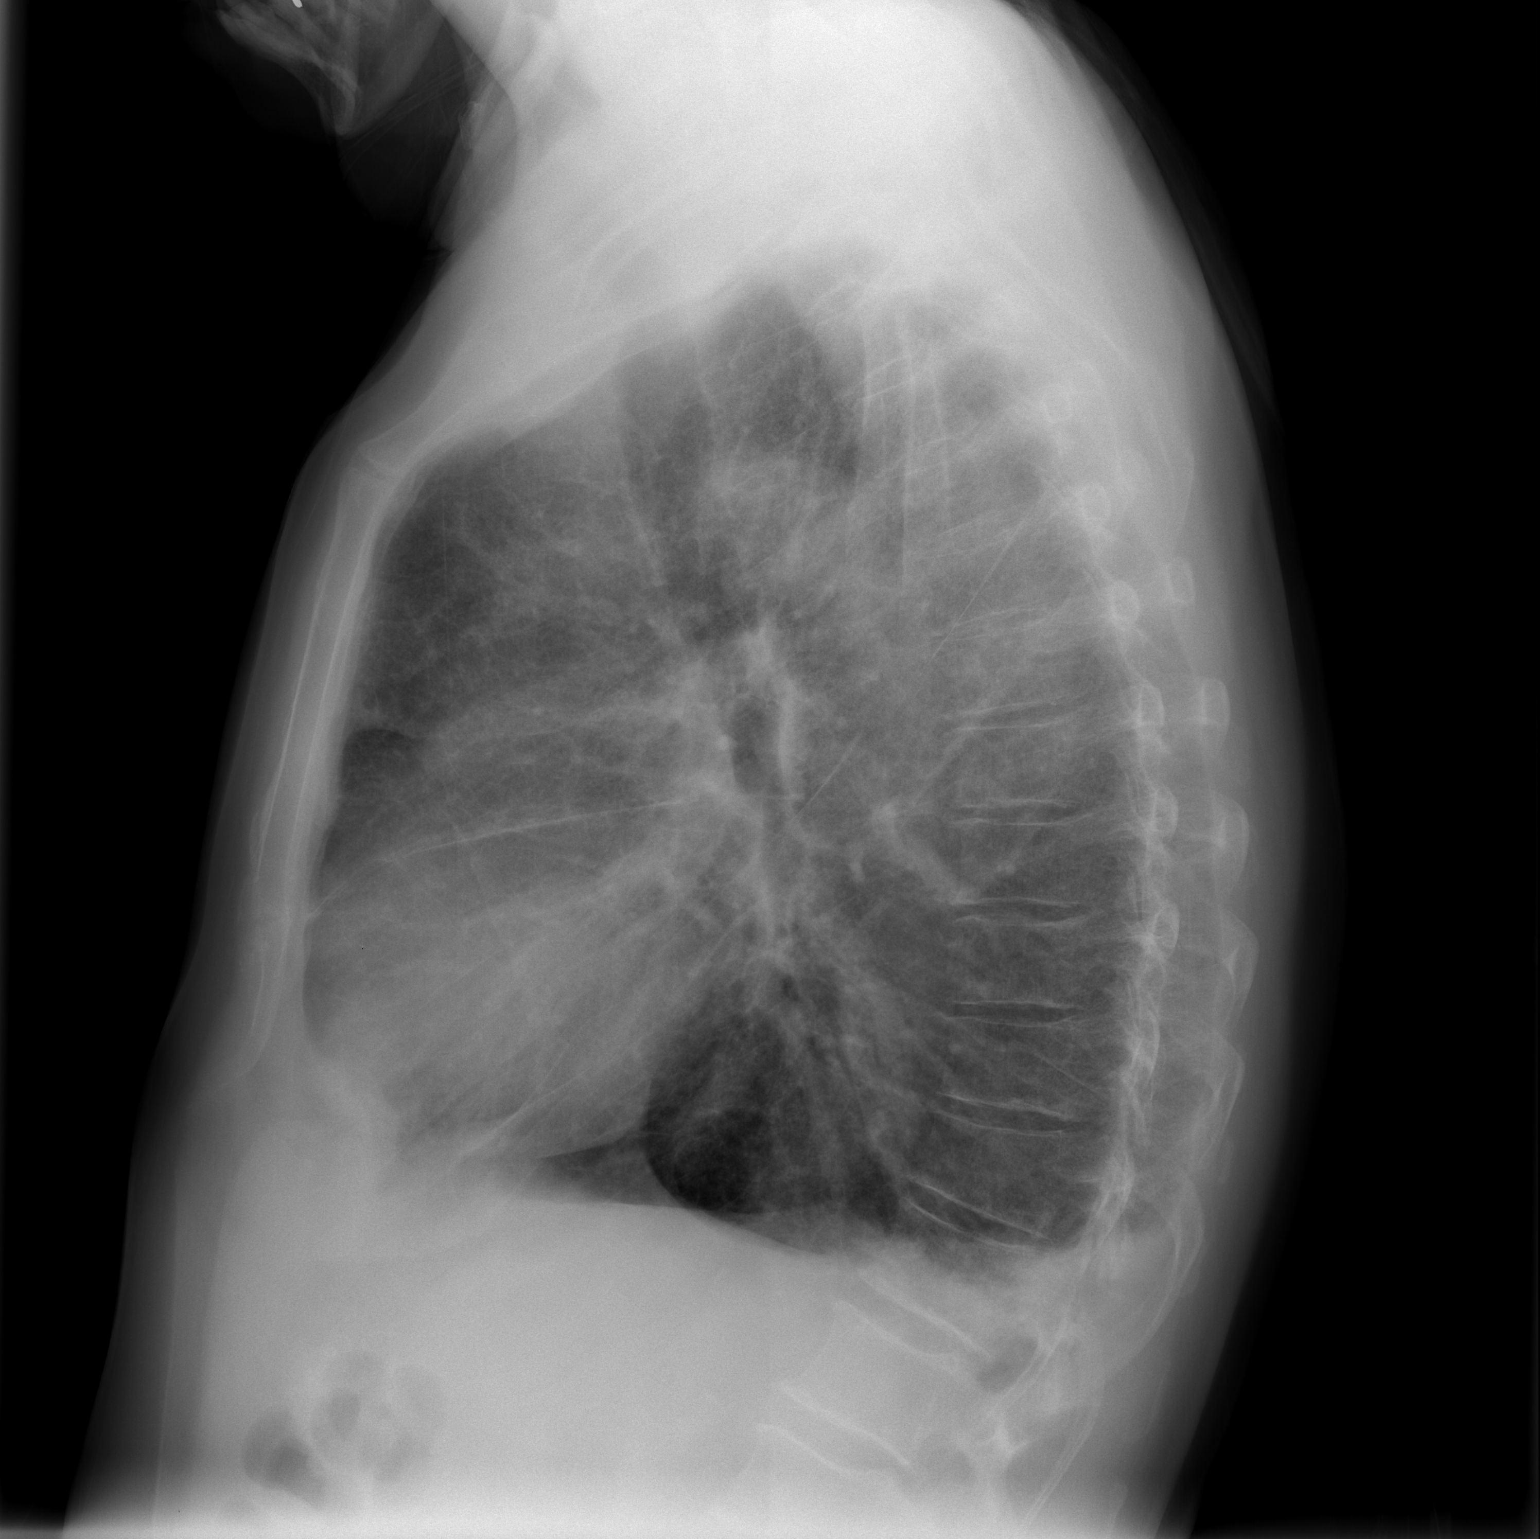

[2 of 2 positions shown; findings below may reference images not displayed]

FINDINGS: There is hyperinflation of the lungs compatible with
COPD.  Scarring or atelectasis in the bases.  There is
peribronchial thickening.  No acute infiltrates or effusions.
Heart is normal size.
IMPRESSION: COPD/chronic changes.

No evidence of pneumonia.

## 2008-08-27 IMAGING — CT CT ANGIO CHEST
3 of 7 series · 18 of 36 positions shown · IV contrast (APPLIED)
Comparison: No comparison chest CT.  Plain film examination
[DATE].

CLINICAL DATA: Lung nodule.  Elevated D-dimer.  Cough.  Shortness
breath.

CT ANGIOGRAPHY CHEST WITH CONTRAST
TECHNIQUE: Multidetector CT imaging of the chest was performed
using the standard protocol during bolus administration of
intravenous contrast. Multiplanar CT image reconstructions
including MIPs were obtained to evaluate the vascular anatomy.
Contrast: 80 ml [8T].

[Series 8: pe thins @ 1mm · axial · 0.68mm/px · z∈[-346,-81]mm · 15 of 303 slices shown]
[im 19/303  lung]
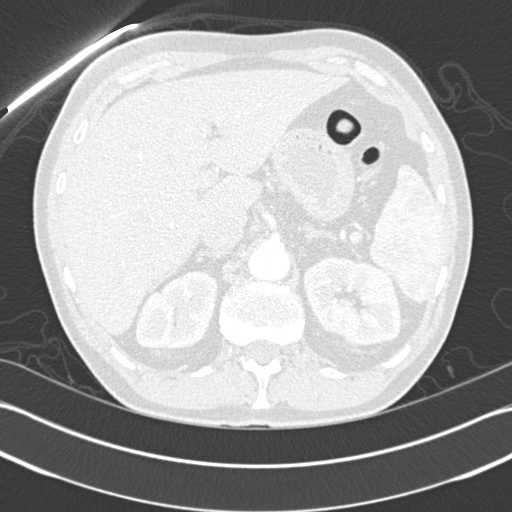
[im 38/303  mediastinal]
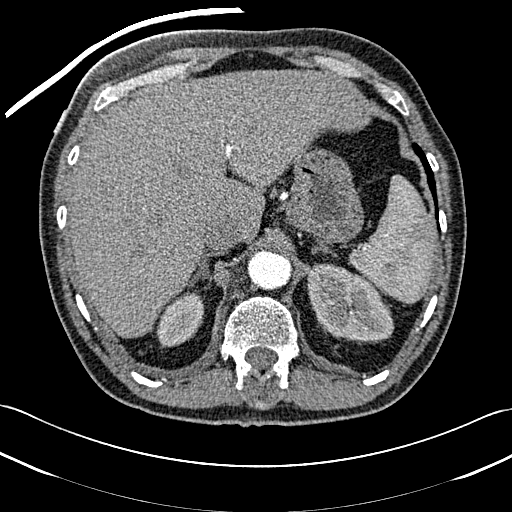
[im 57/303  lung]
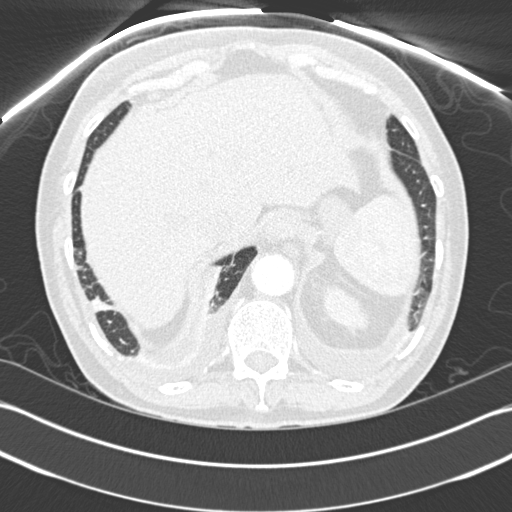
[im 76/303  mediastinal]
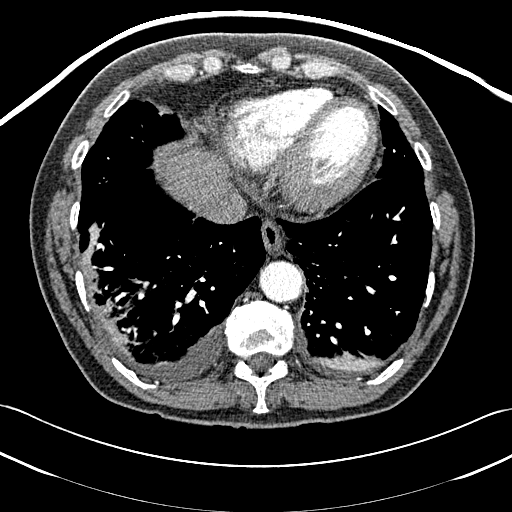
[im 95/303  lung]
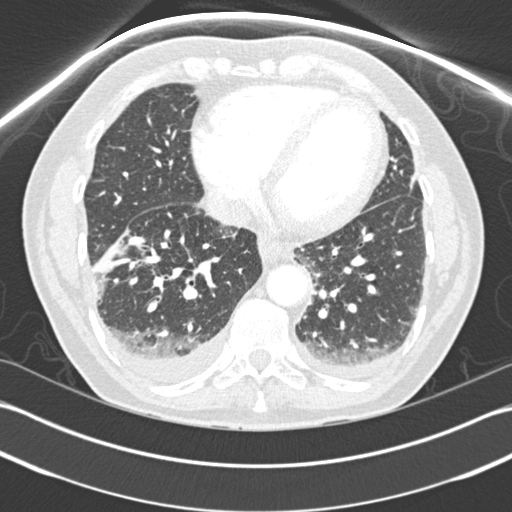
[im 114/303  mediastinal]
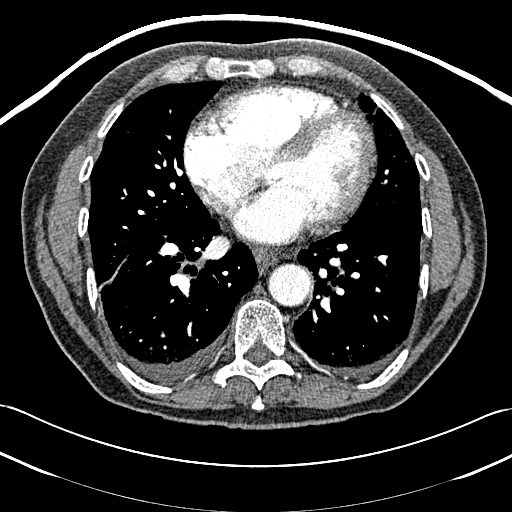
[im 133/303  lung]
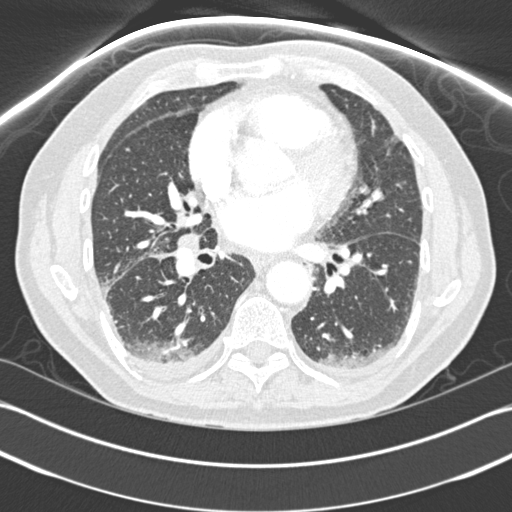
[im 152/303  mediastinal]
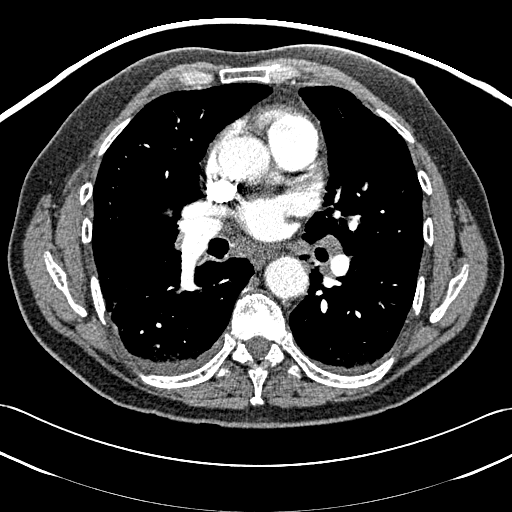
[im 170/303  lung]
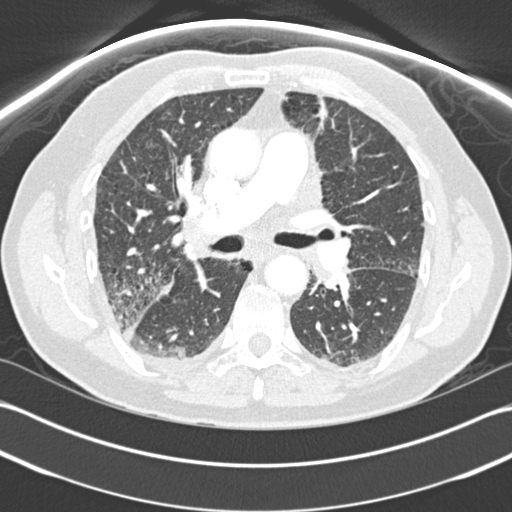
[im 189/303  mediastinal]
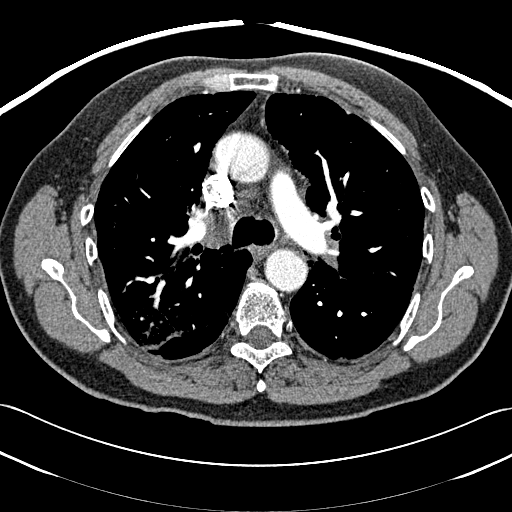
[im 208/303  lung]
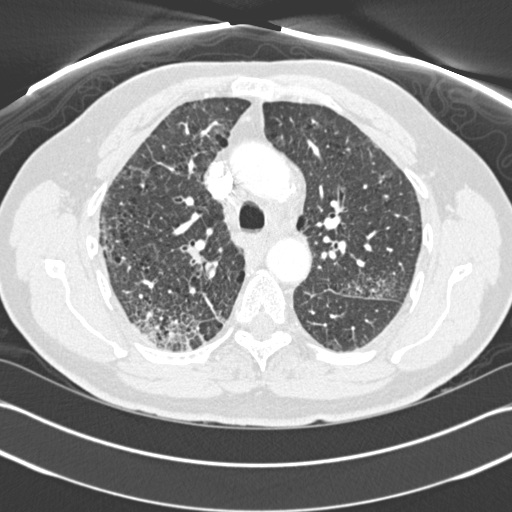
[im 227/303  mediastinal]
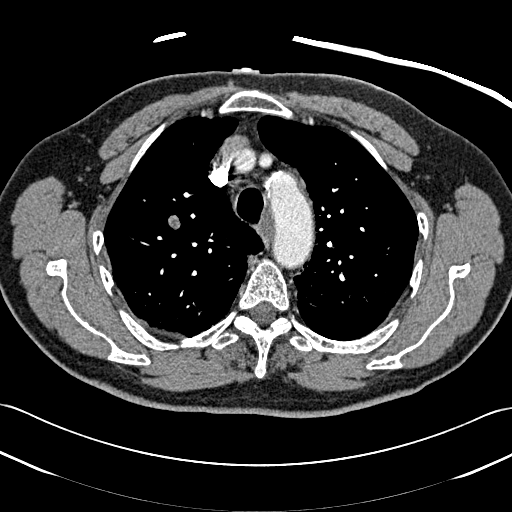
[im 246/303  lung]
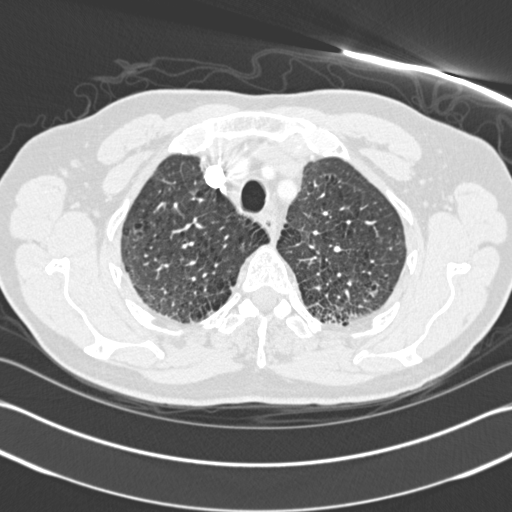
[im 265/303  mediastinal]
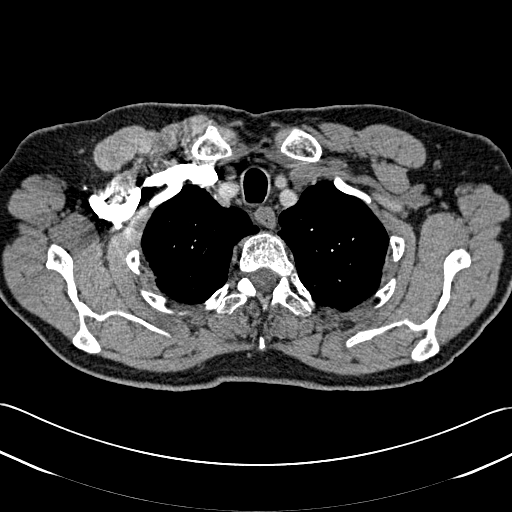
[im 284/303  lung]
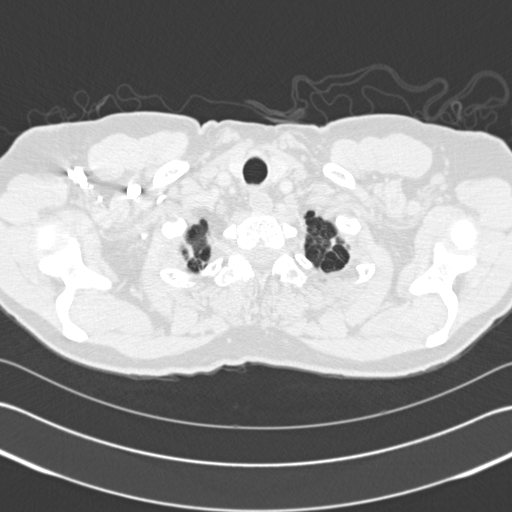

[Series 9: lung windows · axial · 0.68mm/px · z∈[-263,-197]mm · 2 of 90 slices shown]
[im 23/90  mediastinal]
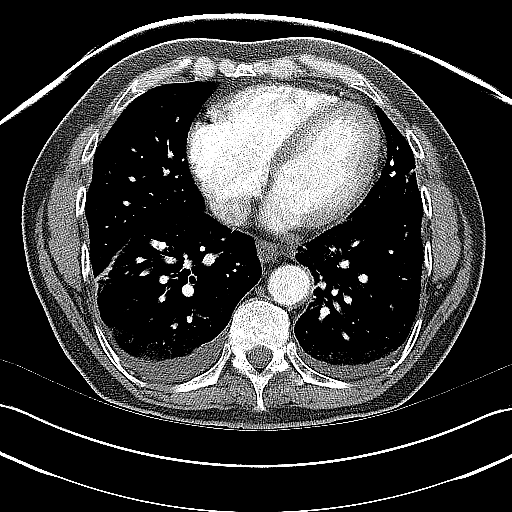
[im 45/90  mediastinal]
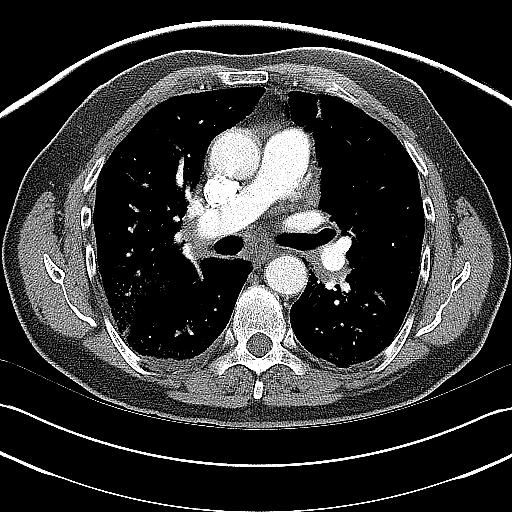

[Series 602: mpr coronal · coronal · 0.68mm/px · 1 of 121 slices shown]
[im 61/121  mediastinal]
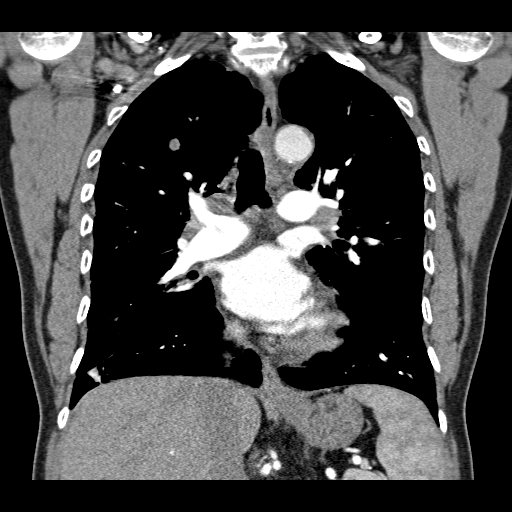

[18 of 36 positions shown; findings below may reference images not displayed]

FINDINGS: No pulmonary embolus or aortic dissection.
Atherosclerotic type changes proximal great vessels and aorta.
There may also be coronary artery calcifications.  No pericardial
effusion.  Heart top normal in size.

Right upper lobe 11.2 x 9.6 x 11.5 mm nodule (series 9 image 26 and
series 603 image 46).  Mediastinal and bilateral hilar adenopathy.
Index AP window lymph nodes versus single lymph node measures 3.8 x
1.2 cm (series 6 image 36). Lungs scarring with superimposed
pulmonary parenchymal changes most notable posterior aspect upper
lobe bilaterally and lower lobes bilaterally with small pleural
effusion greater on the right.  These findings may represent
combination of tumor and / or infectious infiltrate.  Inflammatory
process such as sarcoidosis cannot be excluded.  Clinical
correlation and follow-up PET CT recommended for further
evaluation.

Degenerative changes thoracic spine with mild kyphosis lower
thoracic region.  No bony destructive lesion.  Visualized upper
abdominal structures unremarkable.

Review of the MIP images confirms the above findings.
IMPRESSION: No pulmonary embolus or aortic dissection.

Right upper lobe 11.2 x 9.6 x 11.5 mm nodule (series 9 image 26 and
series 603 image 46).  Mediastinal and bilateral hilar adenopathy.
Index AP window lymph nodes versus single lymph node measures 3.8 x
1.2 cm (series 6 image 36). Lungs scarring with superimposed
pulmonary parenchymal changes most notable posterior aspect upper
lobe bilaterally and lower lobes bilaterally with small pleural
effusion greater on the right.  These findings may represent
combination of tumor and / or infectious infiltrate.  Inflammatory
process such as sarcoidosis cannot be excluded.  Clinical
correlation and follow-up PET CT recommended for further
evaluation.

This has been made a call report.

## 2008-09-14 ENCOUNTER — Ambulatory Visit (HOSPITAL_COMMUNITY): Admission: RE | Admit: 2008-09-14 | Discharge: 2008-09-14 | Payer: Self-pay | Admitting: Family Medicine

## 2008-09-14 IMAGING — CT NM PET TUM IMG INITIAL (PI) SKULL BASE T - THIGH
6 series · 25 of 25 positions shown · IV contrast ([ID])
Comparison: [DATE] chest CT.

CLINICAL DATA: Recent imaging demonstrates a solitary pulmonary
nodule.  PET CT requested to evaluate for possible malignancy.

NUCLEAR MEDICINE PET CT SKULL BASE TO THIGH
TECHNIQUE: 18.1 mCi F-18 FDG was injected intravenously via the
right wrist vein.  Full-ring PET imaging was performed from the
skull base through the mid-thighs 72  minutes after injection.  CT
data was obtained and used for attenuation correction and anatomic
localization only.  (This was not acquired as a diagnostic CT
examination.)
Fasting Blood Glucose:  92

[Series 1: pet ac · axial · 3.3mm · 4.69mm/px · z∈[-884,-14]mm · 5 of 267 slices shown]
[im 1/267]
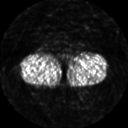
[im 67/267]
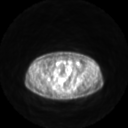
[im 134/267]
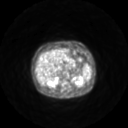
[im 200/267]
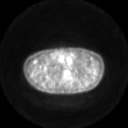
[im 267/267]
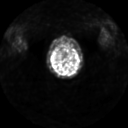

[Series 2: ct images · axial · 3.8mm · 0.98mm/px · z∈[-884,-14]mm · 5 of 267 slices shown]
[im 1/267]
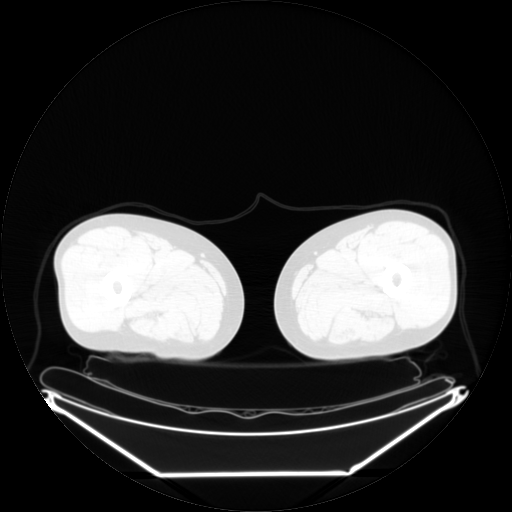
[im 67/267]
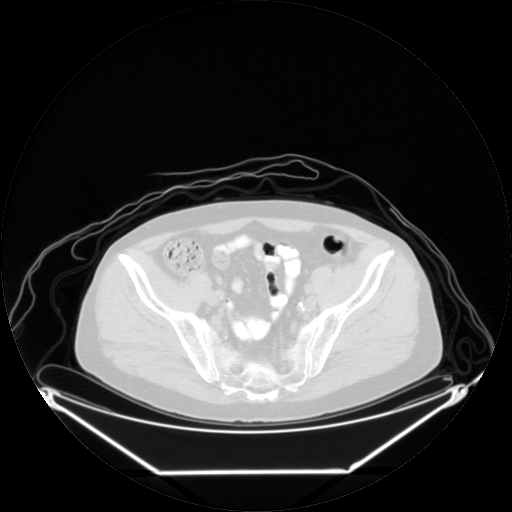
[im 134/267]
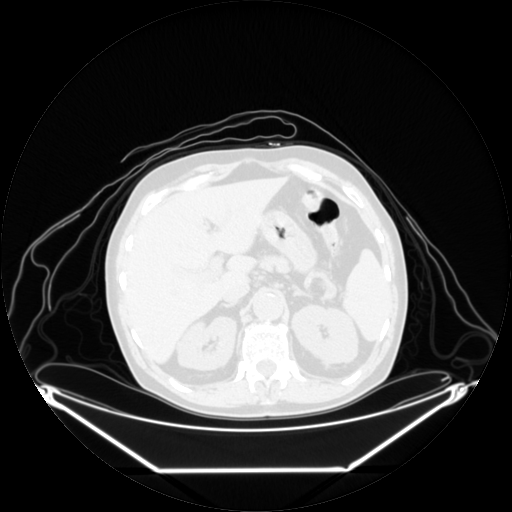
[im 200/267]
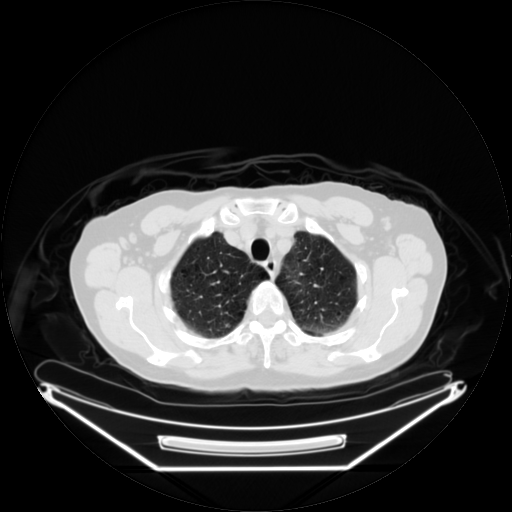
[im 267/267  brain]
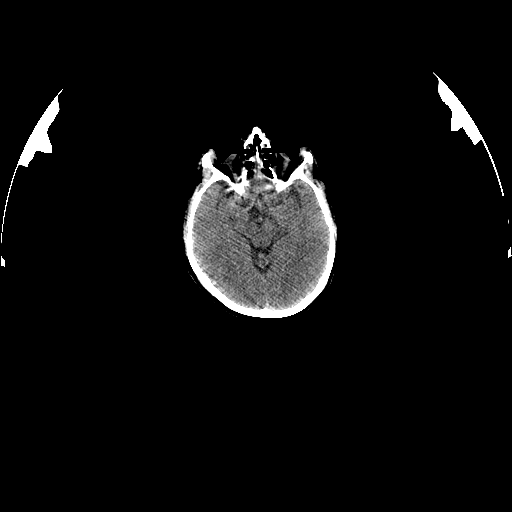

[Series 2: pet nac · axial · 3.3mm · 4.69mm/px · z∈[-884,-14]mm · 6 of 267 slices shown]
[im 1/267]
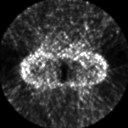
[im 54/267]
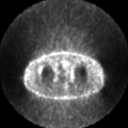
[im 107/267]
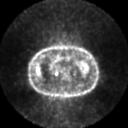
[im 160/267]
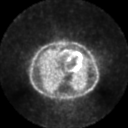
[im 213/267]
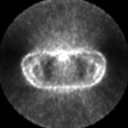
[im 267/267]
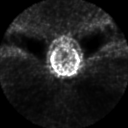

[Series 123: mip · coronal · 3.3mm · 4.69mm/px · 1 of 30 slices shown]
[im 1/30]
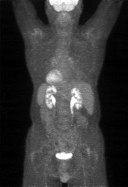

[Series 151: reformatted · axial · 3.3mm · 3.91mm/px · z∈[-884,-14]mm · 6 of 267 slices shown (1 of 2)]
[im 1/267]
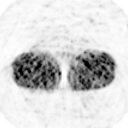
[im 54/267]
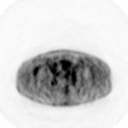
[im 107/267]
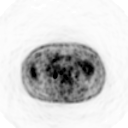
[im 160/267]
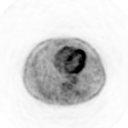
[im 213/267]
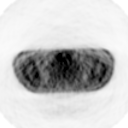
[im 267/267]
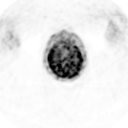

[Series 153: reformatted · coronal · 4.7mm · 6.98mm/px · 2 of 72 slices shown (2 of 2)]
[im 1/72]
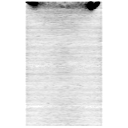
[im 72/72]
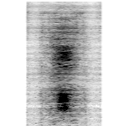

[25 of 25 positions shown; findings below may reference images not displayed]

FINDINGS: The 11 mm right upper lobe pulmonary nodule does not
exhibit increased F D G activity and has a maximum S U V of 0.9.
Shotty AP window and bilateral hilar lymph nodes exhibit mildly
increased metabolic activity with a maximum SUV of 3.6.
No other abnormal areas of increased F D G activity are identified
within the neck, chest, abdomen, or pelvis.

Review of the CT images demonstrates emphysema, interstitial
opacities which may represent fibrosis, and nonobstructing
bilateral renal calculi.
IMPRESSION: 11 mm right upper lobe pulmonary nodule which does not exhibit
increased metabolic activity.  Although this may represent a benign
nodule, a low grade neoplasm is not excluded in this patient with
emphysema and 6-month CT follow-up or sampling should be
considered.

Shotty mediastinal and bilateral hilar lymph nodes with mild
increased metabolic activity.  These are more likely to be reactive
but consider 6-month CT follow-up to ensure stability.

Emphysema and interstitial lung disease.

## 2008-09-25 DIAGNOSIS — I359 Nonrheumatic aortic valve disorder, unspecified: Secondary | ICD-10-CM

## 2008-09-25 DIAGNOSIS — F411 Generalized anxiety disorder: Secondary | ICD-10-CM | POA: Insufficient documentation

## 2008-09-28 ENCOUNTER — Ambulatory Visit: Payer: Self-pay | Admitting: Internal Medicine

## 2008-09-28 DIAGNOSIS — H409 Unspecified glaucoma: Secondary | ICD-10-CM | POA: Insufficient documentation

## 2008-09-28 DIAGNOSIS — R911 Solitary pulmonary nodule: Secondary | ICD-10-CM | POA: Insufficient documentation

## 2008-10-14 ENCOUNTER — Ambulatory Visit: Payer: Self-pay | Admitting: Urology

## 2008-10-14 IMAGING — CR DG ABDOMEN 1V
1 series · 2 of 2 positions shown · non-contrast
Comparison: none

REASON FOR EXAM: Nephrolithiasis
COMMENTS:

[Series 1: view not recorded · 0.17mm/px · 2 of 2 slices shown]
[im 1/2]
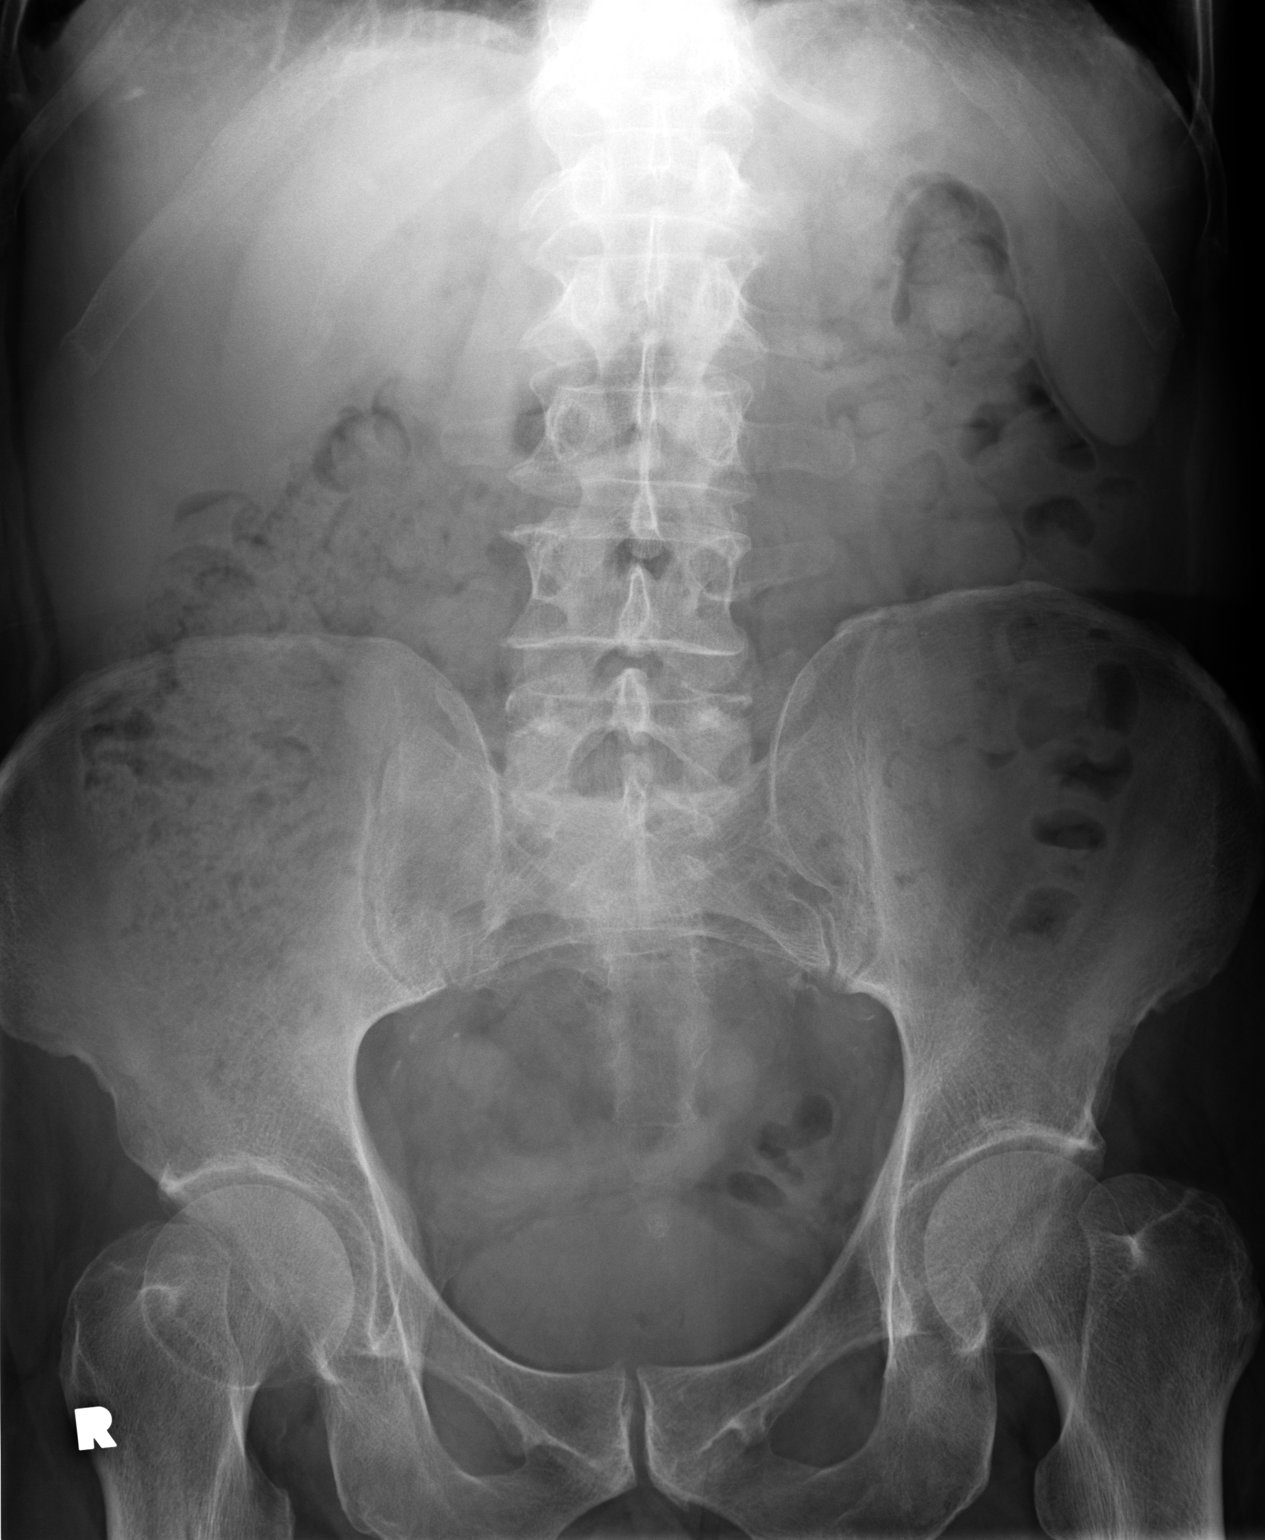
[im 2/2]
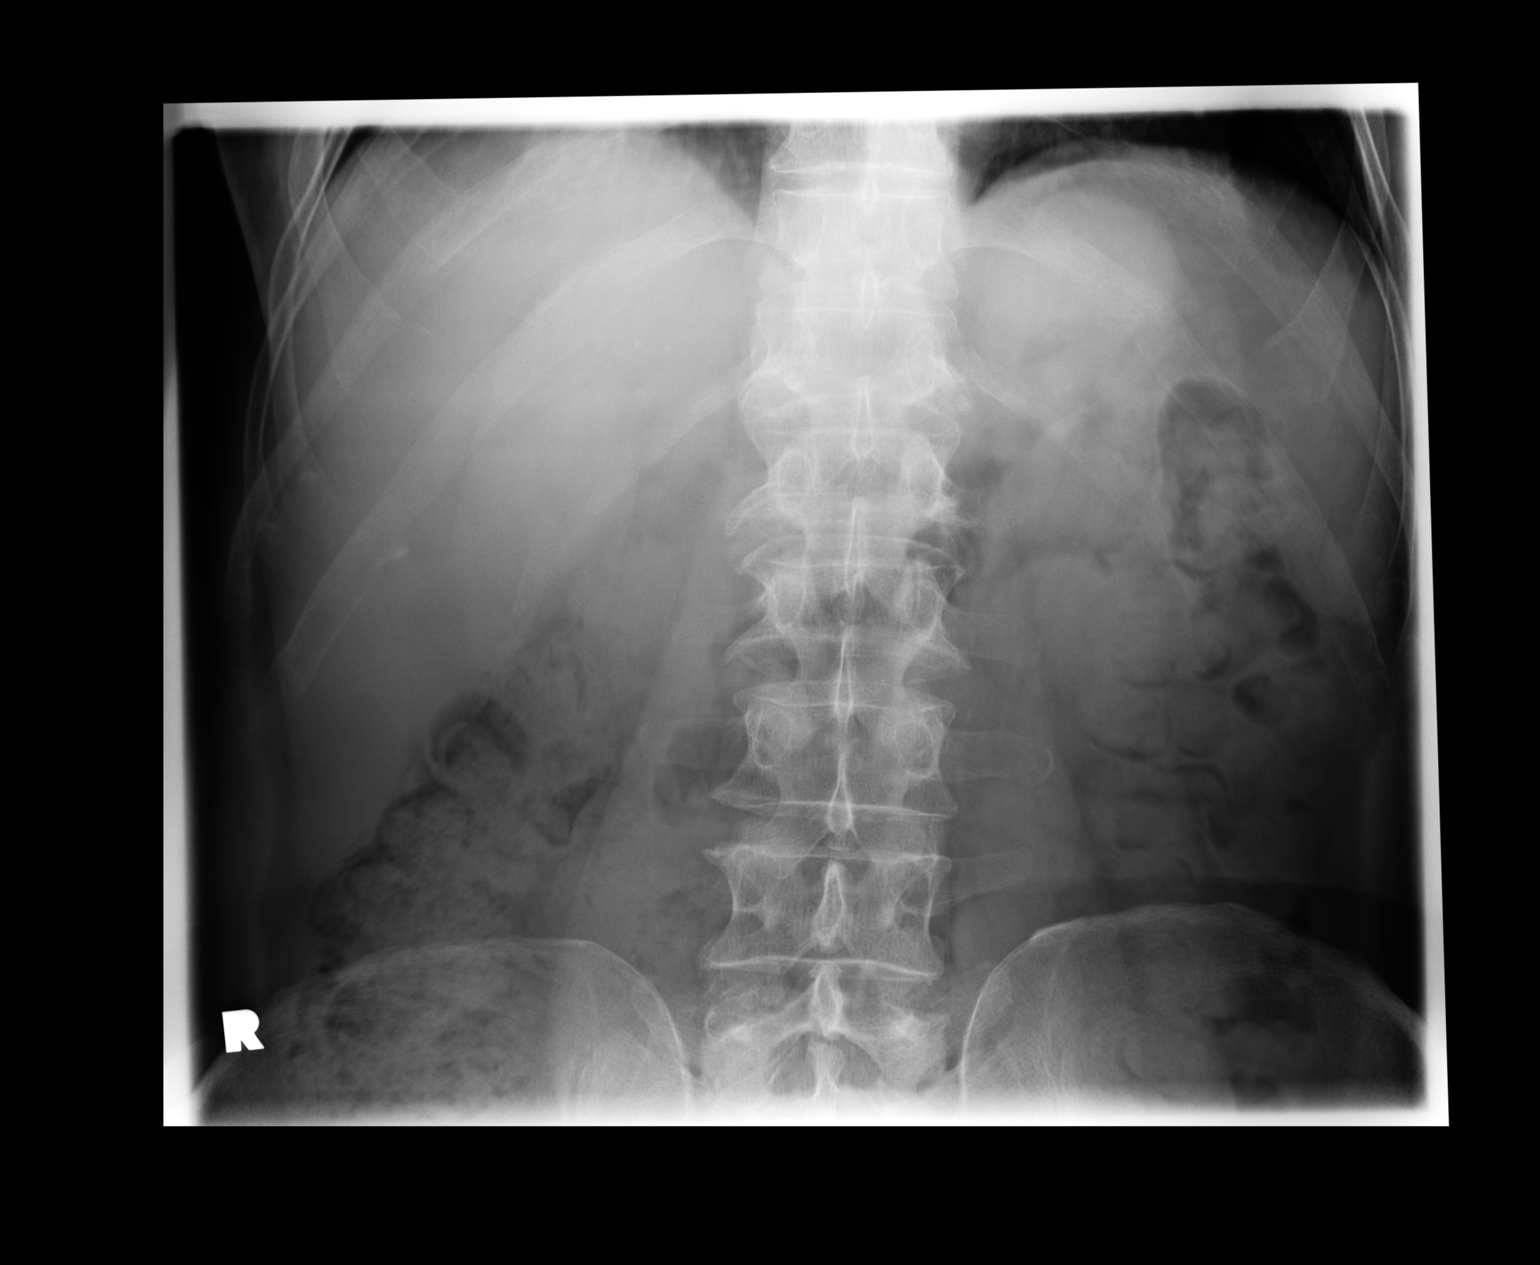

[2 of 2 positions shown; findings below may reference images not displayed]

PROCEDURE:     DXR - DXR KIDNEY URETER BLADDER  - [DATE]  [DATE]

RESULT:     Comparison is made to the exam dated [DATE]. The right
double-J ureteral stent has been removed. Tiny upper pole right renal
calculi persist.  No atherosclerotic calcification is present. No definite
radiopaque stones are appreciated. The bowel gas pattern is unremarkable.
IMPRESSION: 1.     Interval removal of the double-J ureteral stent on the right.
2.     No definite residual nephrolithiasis of significance. Some
nonobstructing upper pole stones are present.

## 2008-12-03 ENCOUNTER — Ambulatory Visit: Payer: Self-pay | Admitting: Internal Medicine

## 2009-02-15 ENCOUNTER — Telehealth (INDEPENDENT_AMBULATORY_CARE_PROVIDER_SITE_OTHER): Payer: Self-pay | Admitting: *Deleted

## 2009-02-17 ENCOUNTER — Ambulatory Visit: Payer: Self-pay | Admitting: Cardiovascular Disease

## 2009-02-17 IMAGING — CT CT CHEST W/O CM
3 of 7 series · 14 of 36 positions shown, 16 images · non-contrast
Comparison: [DATE] PET CT scan and [DATE] chest CT scan.

CLINICAL DATA: Follow-up pulmonary nodule.  Smoker for 51 years.
History of COPD.

CT CHEST WITHOUT CONTRAST
TECHNIQUE: Multidetector CT imaging of the chest was performed
following the standard protocol without IV contrast.

[Series 5: lung · axial · 0.66mm/px · z∈[-444,-204]mm · 8 of 62 slices shown, 10 images]
[im 7/62  mediastinal]
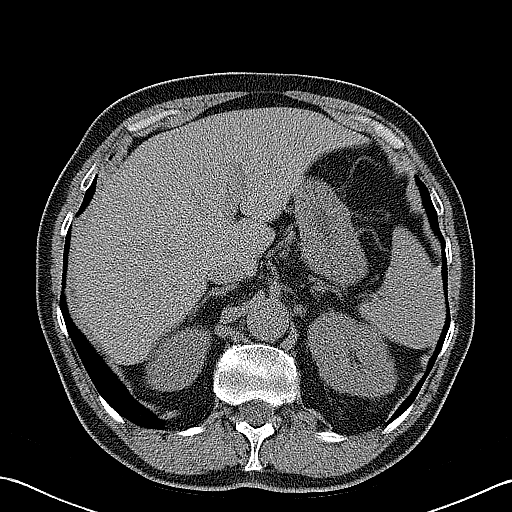
[im 7/62  lung]
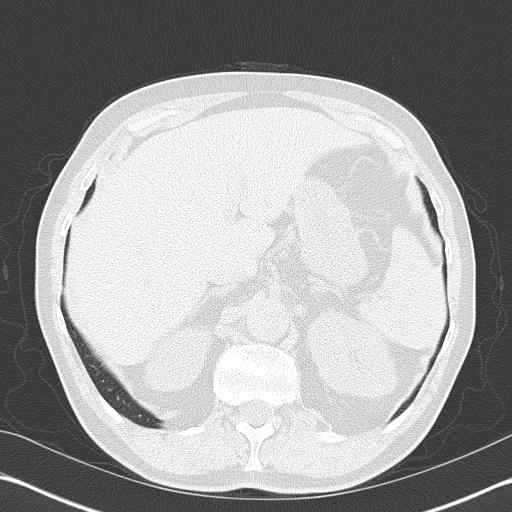
[im 14/62  lung]
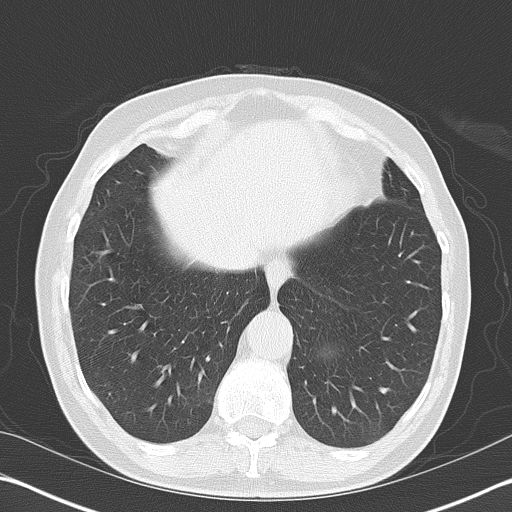
[im 21/62  lung]
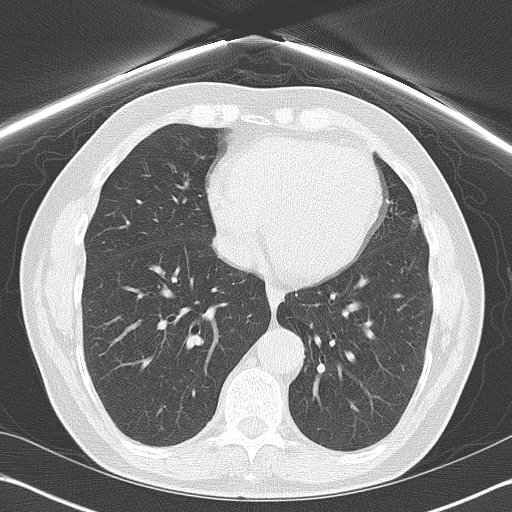
[im 28/62  lung]
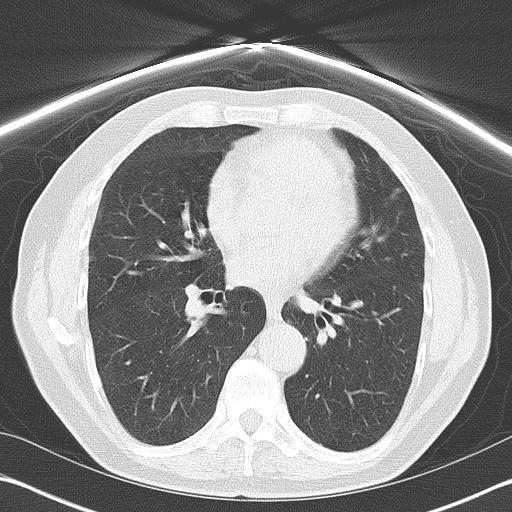
[im 34/62  mediastinal]
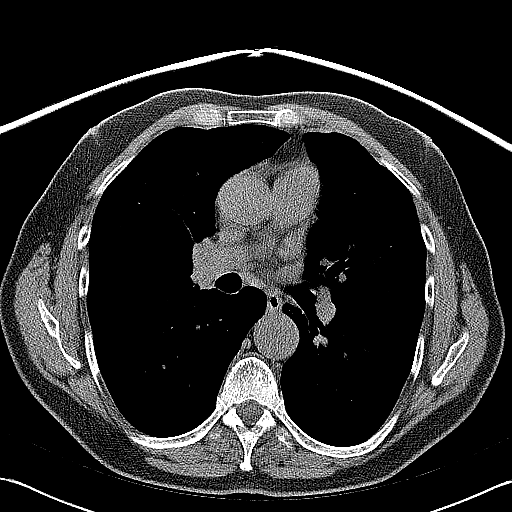
[im 34/62  lung]
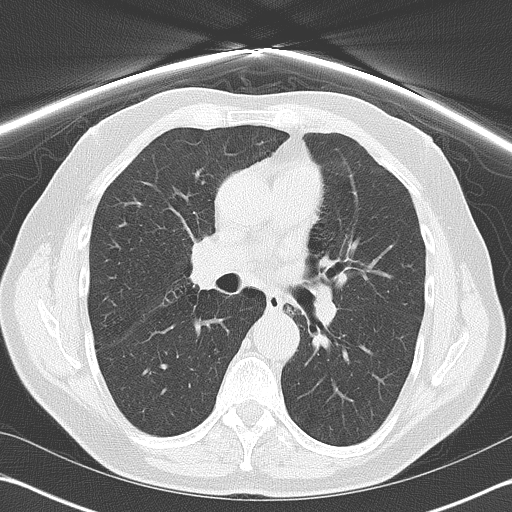
[im 41/62  lung]
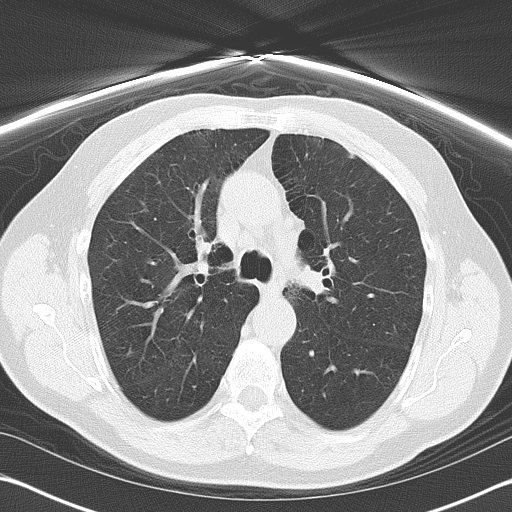
[im 48/62  lung]
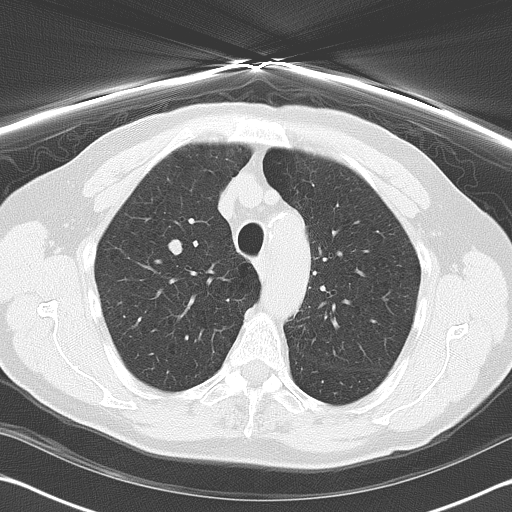
[im 55/62  lung]
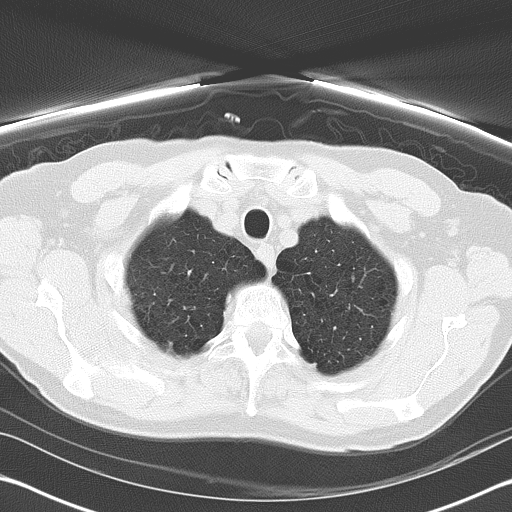

[Series 6: high res lung 1x5 · axial · 0.66mm/px · z∈[-404,-244]mm · 3 of 32 slices shown]
[im 8/32  lung]
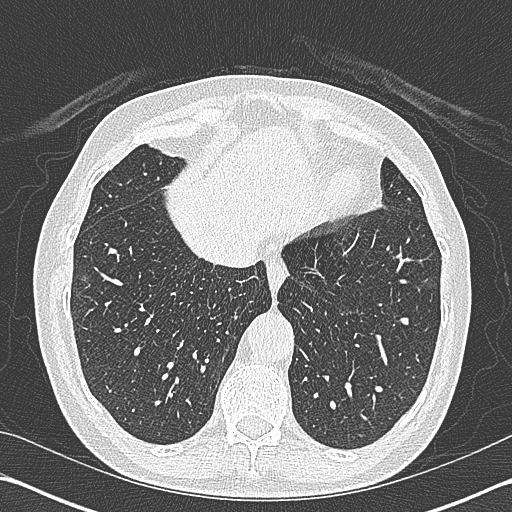
[im 16/32  lung]
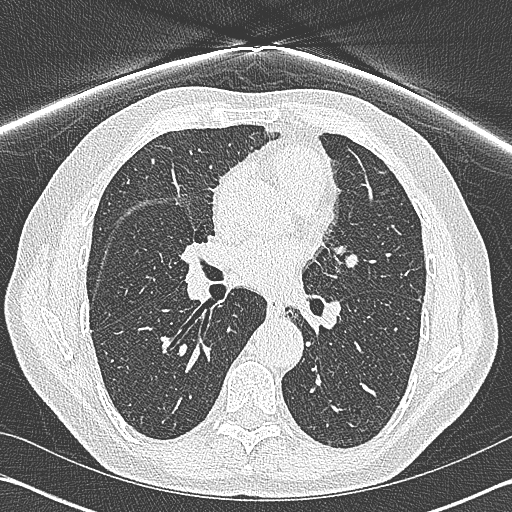
[im 24/32  lung]
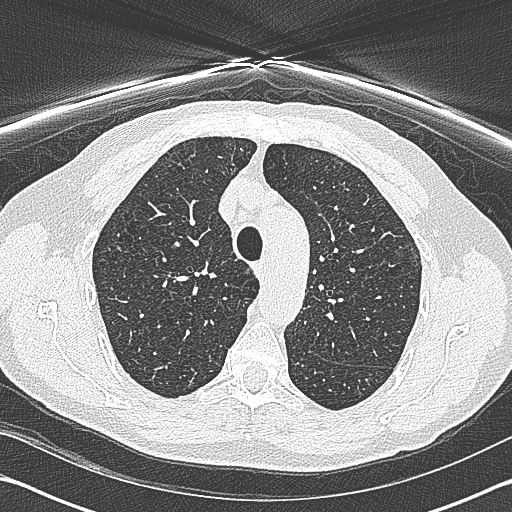

[Series 602: <mpr thick range> · coronal · 0.66mm/px · 3 of 128 slices shown]
[im 26/128  lung]
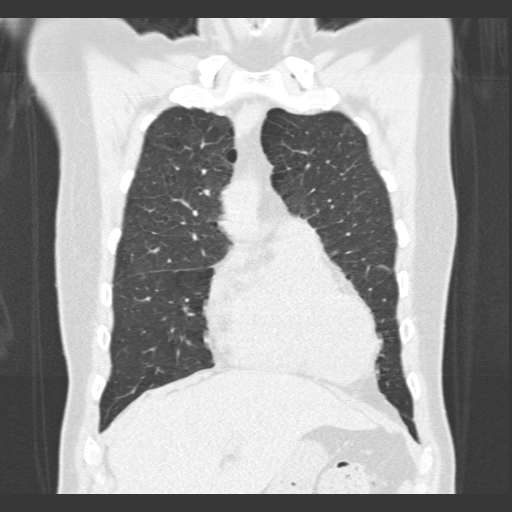
[im 51/128  lung]
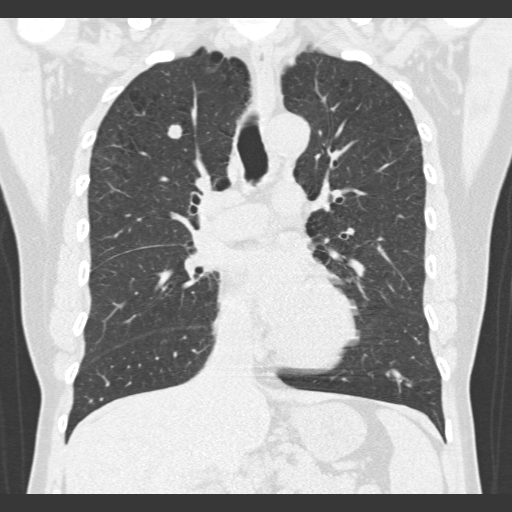
[im 77/128  lung]
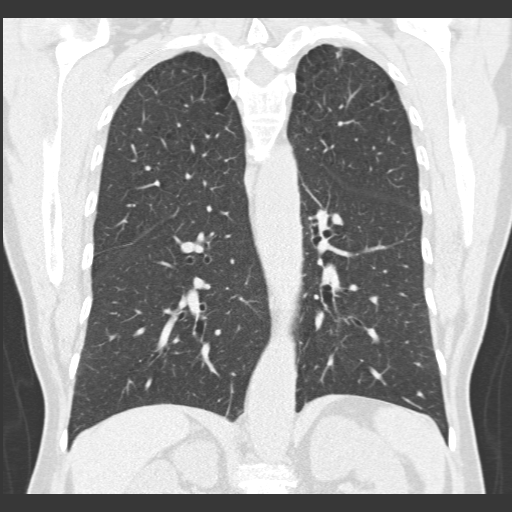

[14 of 36 positions shown; findings below may reference images not displayed]

FINDINGS: The right upper lobe pulmonary nodule is stable in size
and configuration measuring 9 x 10 x 11 mm in size (coronal image
number 50 and axial image #15). The previously seen interstitial
lung disease has significantly improved.  Also, the bilateral
pleural effusions have resolved.  In addition, the mildly enlarged
mediastinal lymph nodes appear less prominent on today's study.
The index node previously measured in the region of the
aorticopulmonary window now measures 7 x 26 mm in size (axial image
#21 series 2).  Previously this measured 12 x 38 mm in size.  There
is no new adenopathy.  The central airways are patent.
IMPRESSION: 1.  Stable right upper lobe pulmonary nodule measuring 9 x 10 x 11
mm.  Recommend follow-up chest CT scan is 6 months for a longer
period of stability.
2.  Interval improvement in the previously seen interstitial
changes and interval resolution of the bilateral pleural effusions.
3.  Interval decrease in size of previously seen enlarged
mediastinal lymph nodes.

## 2009-03-03 ENCOUNTER — Telehealth (INDEPENDENT_AMBULATORY_CARE_PROVIDER_SITE_OTHER): Payer: Self-pay | Admitting: *Deleted

## 2010-07-11 LAB — GLUCOSE, CAPILLARY: Glucose-Capillary: 92 mg/dL (ref 70–99)

## 2010-07-12 LAB — COMPREHENSIVE METABOLIC PANEL
ALT: 13 U/L (ref 0–53)
AST: 18 U/L (ref 0–37)
Albumin: 3 g/dL — ABNORMAL LOW (ref 3.5–5.2)
Alkaline Phosphatase: 50 U/L (ref 39–117)
BUN: 10 mg/dL (ref 6–23)
CO2: 25 mEq/L (ref 19–32)
Calcium: 8.4 mg/dL (ref 8.4–10.5)
Creatinine, Ser: 0.72 mg/dL (ref 0.4–1.5)
GFR calc Af Amer: >60 mL/min (ref >60)
GFR calc non Af Amer: >60 mL/min (ref >60)
Glucose, Bld: 104 mg/dL — ABNORMAL HIGH (ref 70–99)
Potassium: 4.1 mEq/L (ref 3.5–5.1)
Sodium: 138 mEq/L (ref 135–145)

## 2010-07-12 LAB — DIFFERENTIAL
Basophils Relative: 0 % (ref 0–1)
Basophils Relative: 1 % (ref 0–1)
Eosinophils Absolute: 0.4 10*3/uL (ref 0.0–0.7)
Eosinophils Absolute: 0.6 10*3/uL (ref 0.0–0.7)
Monocytes Relative: 4 % (ref 3–12)
Neutrophils Relative %: 59 % (ref 43–77)
Neutrophils Relative %: 85 % — ABNORMAL HIGH (ref 43–77)

## 2010-07-12 LAB — URINE CULTURE
Colony Count: NO GROWTH
Culture: NO GROWTH
Special Requests: NEGATIVE

## 2010-07-12 LAB — CULTURE, BLOOD (ROUTINE X 2): Culture: NO GROWTH

## 2010-07-12 LAB — CBC
HCT: 38.6 % — ABNORMAL LOW (ref 39.0–52.0)
Hemoglobin: 13.8 g/dL (ref 13.0–17.0)
Hemoglobin: 14.5 g/dL (ref 13.0–17.0)
Hemoglobin: 15 g/dL (ref 13.0–17.0)
MCHC: 32.4 g/dL (ref 30.0–36.0)
MCHC: 32.6 g/dL (ref 30.0–36.0)
MCHC: 34 g/dL (ref 30.0–36.0)
MCV: 89.4 fL (ref 78.0–100.0)
MCV: 91.1 fL (ref 78.0–100.0)
Platelets: 227 10*3/uL (ref 150–400)
Platelets: 274 10*3/uL (ref 150–400)
RBC: 4.6 MIL/uL (ref 4.22–5.81)
RBC: 4.72 MIL/uL (ref 4.22–5.81)
RBC: 4.91 MIL/uL (ref 4.22–5.81)
RBC: 4.99 MIL/uL (ref 4.22–5.81)
RDW: 13.8 % (ref 11.5–15.5)
RDW: 13.8 % (ref 11.5–15.5)
WBC: 11.1 10*3/uL — ABNORMAL HIGH (ref 4.0–10.5)
WBC: 12.4 10*3/uL — ABNORMAL HIGH (ref 4.0–10.5)

## 2010-07-12 LAB — PROTIME-INR
INR: 1.3 (ref 0.00–1.49)
Prothrombin Time: 16.2 seconds — ABNORMAL HIGH (ref 11.6–15.2)

## 2010-07-12 LAB — URINALYSIS, ROUTINE W REFLEX MICROSCOPIC
Glucose, UA: NEGATIVE mg/dL
Hgb urine dipstick: NEGATIVE
Protein, ur: NEGATIVE mg/dL
pH: 6.5 (ref 5.0–8.0)

## 2010-07-12 LAB — CK TOTAL AND CKMB (NOT AT ARMC): CK, MB: 1.5 ng/mL (ref 0.3–4.0)

## 2010-07-12 LAB — APTT: aPTT: 45 seconds — ABNORMAL HIGH (ref 24–37)

## 2010-07-12 LAB — POCT CARDIAC MARKERS: Myoglobin, poc: 65.9 ng/mL (ref 12–200)

## 2010-07-12 LAB — D-DIMER, QUANTITATIVE: D-Dimer, Quant: 1.11 ug/mL-FEU — ABNORMAL HIGH (ref 0.00–0.48)

## 2010-08-16 NOTE — Discharge Summary (Signed)
Connor Cuevas, MUTSCHLER                ACCOUNT NO.:  [id number]   MEDICAL RECORD NO.:  [id number]          PATIENT TYPE:  INP   LOCATION:  1427                         FACILITY:  [facility]   PHYSICIAN:  Hollice Espy, M.D.DATE OF BIRTH:  February 19, 1941   DATE OF ADMISSION:  08/26/2008  DATE OF DISCHARGE:                               DISCHARGE SUMMARY   PCP:  Tally Joe, MD.   DISCHARGE DIAGNOSES:  1. Chronic obstructive pulmonary disease exacerbation.  2. Lung mass.  3. History of glaucoma.  4. Recent cystoscopy with ureteral stent placement.   DISCHARGE MEDICATIONS:  For this patient, are as follows:  1. Avelox 400 mg p.o. daily x3 days.  2. Albuterol inhaler 2 puffs 4 times a day for 3 days then p.r.n.   The patient will continue all previous medicines as follows:  1. Detrol 4 mg p.o. daily.  2. Spiriva 18 mcg inhaled daily.  3. Vicodin p.r.n.  4. He previously had been on Macrodantin, this has been stopped.  5. Xalatan 0.005% one drop both eyes nightly.   HOSPITAL COURSE:  The patient is a 70 year old white male with a past  medical history of COPD and recent ureteral stent placement who  presented with a 1-day history of some shortness of breath, cough and a  temperature of 102.  He had been put on Macrodantin after his procedure.  When he came into the emergency room, he was noted to have a white blood  cell count of 21.3.  He initially was complaining of some right sided  ear pain and a CT of the neck was done because of the complaints of  shortness of breath as well.  The CT of the neck just showed some slight  asymmetry of the left tongue, but essentially within normal limits, but  concerning was CT of the neck noted an 11-mm right apical nodule.  A  chest x-ray was done which showed COPD and chronic changes but no signs  of pneumonia.  The patient was given a fluid bolus and monitored, his  lungs showed some bibasilar crackles, a D-dimer was ordered and the  hospitalists were called for admission of the patient.  When I saw the  patient the following day he was much improved after some nebulizer  treatments.  He was also put on empiric Levaquin.  His blood cultures  ended up being negative and by hospital day #2 the patient's white blood  cell count had come down from 21 down to 15.  In regards to his  shortness of breath, a CT scan of the chest was done which noted no  evidence of any pulmonary embolus.  It did comment on an 11-mm x 9-mm x  11-mm nodule in the right upper lobe with some scattered mediastinal  bilateral hilar adenopathy, lung scarring was noted as well; this was  felt to be a tumor vs. infectious infiltrate vs. combination, an  inflammatory process such as sarcoid could not be excluded.  A PET scan  was recommended after patient's acute issues had clinically resolved.  Over the next several days  the patient received IV fluids, IV  antibiotics and he continued to do well.  His white blood cell count  became slow to respond, however by Aug 30, 2008, the patient was off of  O2.  He would get winded easily but he had no decrease in his O2  saturations, while ambulating on room air, and his white count was down  to 11 at this point.  He was felt to be medically stable for discharge  and I had a discussion with him and the family.  The plan will be for  patient to go home on Avelox 400 mg x3 days to complete a total of a 7-  day course, continue albuterol inhaler schedule for the next few days  and then p.r.n., continuous Spiriva inhaler, for him to increase his  activities slowly and then to follow up with his PCP,  Dr. Tally Joe,  in 1 week's time.  At that time Dr. Azucena Cecil can evaluate the patient and  if his symptoms are improved set him up for a PET scan.  His overall  disposition is improved.  Activity will be slow to increase.  Discharge  diet will be a regular diet.  He is being discharged to home.      Hollice Espy, M.D.  Electronically Signed     SKK/MEDQ  D:  08/30/2008  T:  08/30/2008  Job:  [id number]   cc:   Tally Joe, M.D.  Fax: [phone]

## 2010-08-16 NOTE — H&P (Signed)
NAMEALBA, KRIESEL                ACCOUNT NO.:  [id number]   MEDICAL RECORD NO.:  [id number]          PATIENT TYPE:  INP   LOCATION:  1427                         FACILITY:  [facility]   PHYSICIAN:  Corinna L. Lendell Caprice, MDDATE OF BIRTH:  Sep 29, 1940   DATE OF ADMISSION:  08/26/2008  DATE OF DISCHARGE:                              HISTORY & PHYSICAL   CHIEF COMPLAINT:  Difficulty inhaling, cough, temperature 102, and  swollen glands.   HISTORY OF PRESENT ILLNESS:  Mr. Gibeault is a 70 year old male patient  who presents to the emergency department with complaint of cough as well  as difficulty inhaling.  He did note, however, that he is able to exhale  without difficulty.  He also reported right-sided ear pain and fever up  to 102 last night, which was accompanied by sweats and chills.  He also  notes last Wednesday on May 19, he underwent cystoscopy with stent  placement.  This stent was removed on Aug 25, 2008, now he is currently  on Macrodantin.  The cough and difficulty breathing, however, started on  Aug 25, 2008, in the evening.   ALLERGIES:  No known drug allergies.   PAST MEDICAL HISTORY:  1. Kidney stones.  2. COPD.  3. Glaucoma.   PAST SURGICAL HISTORY:  Right inguinal hernia repair in 2002 and  cystoscopy on November 03, 2000.   SOCIAL HISTORY:  He is married.  He is a retired Film/video editor.  He reports 1-pack per day smoking.  Denies alcohol.  Denies  drug use.   FAMILY HISTORY:  His mother died at age 28 of Alzheimer's and father  died at age 34 from black lung, he was a Teacher, adult education.   MEDICATIONS:  The patient is on Detrol, Vicodin, Macrodantin, Spiriva,  and Xalatan drops.   REVIEW OF SYSTEMS:  GENERAL:  He notes positive fevers, as well as  sweats and chills.  ENT:  He denies dysphagia.  He has had right ear  pain on May 25, which has resolved.  NECK:  Denies neck masses or  swelling.  CARDIOVASCULAR:  Mild chest tightness in the upper chest,  which he  describes more as a respiratory tightness.  RESPIRATORY:  Positive for shortness of breath, described as difficulty inhaling.  GI:  No nausea, vomiting, or diarrhea.  GU:  Denies dysuria or hematuria.  MUSCULOSKELETAL:  He did note some joint pain last night during fevers,  which has resolved.  SKIN:  No rashes.  PSYCHIATRIC:  Denies depression  or anxiety.   LABORATORY DATA/RADIOLOGY:  Urinalysis unremarkable.  CK-MB less than 1,  troponin less than 0.05, myoglobin 65.9.  AST and ALT normal.  Sodium  138, potassium 4.1, chloride 108, bicarb 25, BUN 14, creatinine 0.8, and  glucose 104.  Hemoglobin 15, hematocrit 44.7, white blood cell count  21.3, and platelets 250.   CT of the neck with contrast showed slight asymmetry of left tongue base  and glossotonsillar sulcus likely within normal limits, also notes an 11-  mm right apical lung nodule worsening for neoplasm.   Chest x-ray:  COPD/chronic changes.  No pneumonia.   PHYSICAL EXAMINATION:  VITAL SIGNS:  BP 119/50 up from 93/64 with saline  bolus, heart rate 95, respiratory rate 18, temp 98, T-max 99.8, and O2  sat 97% on 2 L with 93% on room air at admission.  GENERAL:  White male.  He is well nourished.  He is awake and alert in  no acute distress.  ENT:  Epiglottis is within normal limits.  No swelling.  No mouth  lesions.  No palpable lesions beneath the tongue.  No significant  erythema.  The tympanic membranes normal.  NECK:  Supple.  No palpable masses.  CARDIOVASCULAR:  S1 and S2.  Regular rate and rhythm.  No increased  lower extremity edema.  RESPIRATORY:  Bibasilar crackles.  No increased work of breathing.  ABDOMEN:  Soft, nontender, and nondistended.  Positive bowel sounds are  noted.   ASSESSMENT AND PLAN:  1. Shortness of breath/leukocytosis/fever.  Question developing      pneumonia.  We will also check CT angiogram of chest to rule out      PE.  We will send blood cultures x2 and initiate empiric Levaquin.       Second set of cardiac enzymes pending at the time of this      dictation.  We will place him on a tele bed and check his vital      signs every 4 hours.  Check BNP. Get recent stress cardiolyte and      echocardiogram from Dr. Anne Fu' office.  2. Recent cystoscopy stenting, May 19 with stent removal on May 25.      Urinalysis is clean, bute has been taking Macrodantin.  We will      cover him with Levaquin for genitourinary coverage empirically.      Bacteremia from urinary source is within the differential  3. Lung nodule.  CT chest may clarify.  May need further outpatient      workup.  4. Chronic obstructive pulmonary disease.  Stable.  Continue Spiriva.      Sandford Craze, NP      Corinna L. Lendell Caprice, MD  Electronically Signed    MO/MEDQ  D:  08/26/2008  T:  08/27/2008  Job:  [id number]

## 2010-08-19 NOTE — Op Note (Signed)
[facility]  Patient:    CORNELIS, Connor Cuevas Visit Number: [id number] MRN: [id number]          Service Type: DSU Location: DAY Attending Physician:  Ellwood Handler Proc. Date: 11/30/00 Admit Date:  11/30/2000                             Operative Report  DATE OF BIRTH:  [birth year]  PREOPERATIVE DIAGNOSES:  8 mm calcium oscillate monohydrate left UPJ stone now status post ESWL with retained fragments in the left mid ureter.  POSTOPERATIVE DIAGNOSES:  8 mm calcium oscillate monohydrate left UPJ stone now status post ESWL with retained fragments in the left mid ureter.  PROCEDURE:  Cystoscopy, ureteroscopy, and holmium laser treatment of stone.  COMPLICATIONS:  Migration of stony fragments into the left renal pelvis.  DRAINS:  6 French 24 cm double J stent.  ANESTHESIA:  General.  ESTIMATED BLOOD LOSS:  Minimal.  DESCRIPTION OF PROCEDURE:  The patient was prepped and draped in the dorsal lithotomy position after institution of an adequate level of general anesthesia. Indwelling double J stent was grasped with a flexible grasping forceps and withdrawn per urethral meatus. Glidewire was inserted through the double J stent, passed 6 x 8 stony fragment in the proximal portion of the left ureter, coiled nicely in the upper pole calices. A 6 French end-hole catheter was advanced over the guidewire, position confirmed within the left renal pelvis. Glidewire was withdrawn and a guidewire was inserted through the end-hole catheter. The end-hole catheter was withdrawn and then the short 6 French ureteroscope was inserted along side the guidewire. Stone was encountered in the left proximal ureter. A 365 holmium laser fiber was then inserted through the scope and holmium laser fragmentation of the stone was begun.  The stone quickly split into two fragments. The smaller of the two fragments was then completely fragment with fragments migrating  distally within the left ureter. A second fragment, however, continued to slowly migrate proximally beyond the reach of the short 6 French ureteroscope. The short ureteroscope was withdrawn and the long ureteroscope was inserted; however, a tiresome pattern of migration of the stony fragment continued until it was in the mid pole calices beyond the grasp of the ureteroscope. The ureteroscope was then withdrawn. Guidewire was used as a guide for placement of a 6 French 24 cm double J stent. The string was left on, bladder was drained, cystoscope was removed. The patient was returned to recovery in satisfactory condition. Attending Physician:  Ellwood Handler DD:  11/30/00 TD:  11/30/00 Job: [phone] [id number]

## 2010-08-19 NOTE — Op Note (Signed)
[place]  Patient:    Connor Cuevas, Connor Cuevas Visit Number: [id number] MRN: [id number]          Service Type: MED Location: 5500 [id number] Attending Physician:  Ellwood Handler Dictated by:   Donnie Coffin Samuella Cota, M.D. Proc. Date: 12/31/00 Admit Date:  12/10/2000 Discharge Date: 12/12/2000   CC:         Verl Dicker, M.D.  Robert A. Eliezer Lofts., M.D.   Operative Report  CCS NUMBER:  [id number]  PREOPERATIVE DIAGNOSIS:  Right inguinal hernia.  POSTOPERATIVE DIAGNOSIS:  Right inguinal hernia.  OPERATION:  Right inguinal herniorrhaphy with mesh.  SURGEON:  Maisie Fus B. Samuella Cota, M.D.  ANESTHESIA:  Local (0.25% Marcaine without epinephrine, 1% Xylocaine without epinephrine, and sodium bicarbonate) with anesthesia monitoring.  ANESTHESIOLOGIST:  CRNA.  DESCRIPTION OF PROCEDURE:  The patient was taken to the operating room and placed on the table in the supine position.  The right lower quadrant of the abdomen was prepped and draped as a sterile field.  An oblique incision was outlined with the skin marker and then the local anesthetic was used to block the ilioinguinal nerve and the area for the incision.  The local was then used throughout as necessary.  Once the ilioinguinal nerve was exposed, the ilioinguinal nerve was blocked at the internal ring.  The incision was taken through the skin and subcutaneous tissue with subcutaneous bleeders being cauterized with the Bovie.  The external oblique aponeurosis was identified and divided in the line of its fibers to and through the external ring.  The ilioinguinal nerve was dissected free and retracted laterally.  The cord structures were isolated with the Penrose drain.  Dissection back to the recurrent epigastric vessels revealed that the patient had a direct inguinal hernia with no evidence of an indirect hernia.  The cord structures were fairly thin, and there was no evidence of a lipoma  coming off laterally.  The direct hernia sac was then imbricated using a 0 chromic catgut as a running suture over the entire inguinal floor.  A piece of 3 inch x 6 inch Atrium mesh was then fashioned to cover the entire inguinal floor, to extend around the cord structures superior and laterally. The mesh was anchored inferiorly with a running suture of 2-0 Novofil with the first suture being placed in the pubic tubercle.  The other suture was placed in the shelving edge of Pouparts ligament.  The mesh was anchored superiorly and medially with interrupted sutures of 0 Novofil.  A slit was made into the mesh to accommodate the cord structures at the internal ring.  The mesh was then sutured to itself superior and lateral to the internal ring.  The mesh seemed to be lying nicely over the entire inguinal floor.  The cord structures and ilioinguinal nerve were then returned to their normal anatomical position, and the external oblique aponeurosis was reapproximated with interrupted sutures of 3-0 Vicryl.  The subcutaneous tissue was irrigated and closed with 3-0 Vicryl and the skin was closed with a running subcuticular suture of 4-0 Vicryl.  Benzoin and 1/2 inch Steri-Strips were used to reinforce the skin closure.  A dry sterile dressing was applied.  The patient seemed to tolerate the procedure well and was taken to the PACU in satisfactory condition. Dictated by:   Donnie Coffin Samuella Cota, M.D. Attending Physician:  Ellwood Handler DD:  12/31/00 TD:  12/31/00 Job: [phone] [id number]

## 2010-08-19 NOTE — Op Note (Signed)
[facility]  Patient:    Connor Cuevas, Connor Cuevas Visit Number: [id number] MRN: [id number]          Service Type: DSU Location: [facility] [id number] Attending Physician:  Ellwood Handler Proc. Date: 11/09/00 Admit Date:  12/06/2000 Discharge Date: 12/06/2000                             Operative Report  DATE OF BIRTH:  18-Apr-1940  PREOPERATIVE DIAGNOSIS:  Densely calcified 8 mm calculus left ureteropelvic junction.  PROCEDURE:  Cystoscopy, retrograde, left double-J stent.  ANESTHESIA:  General.  DRAINS:  6 French 26 cm left double-J.  DESCRIPTION OF PROCEDURE:  The patient was prepped and draped in the dorsal lithotomy position after institution of an adequate level of general anesthesia.  Preoperative history is somewhat confusing due to recent episode of contusion of the right testis, November 02, 2000, with ultrasound showing left hydrocele and normal left testis with contused right testis.  The patient presented with right flank pain but on CT was found to have stone at the left UPJ.  For that reason, the cystoscope was gently inserted at the urethral meatus, normal urethra and sphincter, nonobstructive prostate, normal trigone and orifices.  Right retrograde was performed which showed normal course and caliber of the ureter, pelvis, and calices with prompt drainage at 3-5 minutes.  Retrograde was repeated to confirm lack of filling defect or obstruction on the right side.  Retrograde was then performed on the left side and showed a densely calcified 8 mm calculus impacted at the UPJ.  Some difficulty was encountered in negotiating the guidewire around the stone.  For that reason, a Glidewire was selected which passed the stone.  Attempts made with injection of contrast to displace the stone into the left renal pelvis were unsuccessful.  The 6 French end-hole catheter was advanced over the Glidewire; Glidewire was withdrawn.  A guidewire was then  inserted, and a 6 French 26 cm double-J stent was passed over the wire with good pigtail formation in the bladder and the left renal pelvis.  The bladder was drained; cystoscope was removed.  The patient was returned to recovery in satisfactory condition. Attending Physician:  Ellwood Handler DD:  12/07/00 TD:  12/07/00 Job: [id number]

## 2010-12-13 ENCOUNTER — Encounter (INDEPENDENT_AMBULATORY_CARE_PROVIDER_SITE_OTHER): Payer: Self-pay | Admitting: Surgery

## 2010-12-14 ENCOUNTER — Ambulatory Visit (INDEPENDENT_AMBULATORY_CARE_PROVIDER_SITE_OTHER): Payer: Medicare Other | Admitting: Surgery

## 2010-12-14 ENCOUNTER — Encounter (INDEPENDENT_AMBULATORY_CARE_PROVIDER_SITE_OTHER): Payer: Self-pay | Admitting: Surgery

## 2010-12-14 VITALS — BP 132/94 | HR 60 | Temp 97.2°F | Ht 69.5 in | Wt 153.1 lb

## 2010-12-14 DIAGNOSIS — K4090 Unilateral inguinal hernia, without obstruction or gangrene, not specified as recurrent: Secondary | ICD-10-CM

## 2010-12-14 DIAGNOSIS — K429 Umbilical hernia without obstruction or gangrene: Secondary | ICD-10-CM

## 2010-12-14 NOTE — Patient Instructions (Addendum)
Hernia A hernia occurs when an internal organ pushes out through a weak spot in the belly (abdominal) wall. Hernias most commonly occur in the groin and around the navel. Hernias also can occur through by cut (incision) made by the surgeon after an abdominal operation. Hernias often can be pushed back into place (reduced). Most hernias tend to get worse over time. Problems occur when abdominal contents get stuck in the opening (incarcerated hernia). The blood supply becomes blocked or impaired (strangulated hernia). Because of these risks, you may require surgery to repair the hernia. CAUSES  Heavy lifting.   Prolonged coughing.   Straining to move your bowels.   Hernias can also occur through a cut (incision) by a surgeon after an abdominal operation.  HOME CARE INSTRUCTIONS  Bed rest is not required. You may continue your normal activities. Avoid heavy lifting (more than 10 pounds) or straining. Cough gently. If you are a smoker it is best to stop. Even the best hernia repair can break down with the continual strain of coughing. Even if you do not have your hernia repaired, a cough will continue to aggravate the problem.   Do not wear anything tight over your hernia. Do not try to keep it in with an outside bandage or truss. These can damage abdominal contents if they are trapped within the hernia sac.   Eat a normal diet. Avoid constipation. Straining over long periods of time will increase hernia size and encourage breakdown of repairs. If you cannot do this with diet alone, stool softeners may be used.  SEEK IMMEDIATE MEDICAL CARE IF: You have problems (symptoms) of a trapped (incarcerated) hernia:  You develop an oral temperature above 102F, or as your caregiver suggests.   You develop increasing abdominal pain.   You feel sick to your stomach (nausea) and vomiting.   The hernia is stuck outside the abdomen, looks discolored, feels hard, or is tender.   You have any changes in your  bowel habits or in the hernia that is unusual for you.   You have increased pain or swelling around the hernia.   You cannot push the hernia back in place by applying gentle pressure while lying down.  MAKE SURE YOU:   Understand these instructions.   Will watch your condition.   Will get help right away if you are not doing well or get worse.  Document Released: 03/20/2005 Document Re-Released: 01/15/2009 [facility] Patient Information 2011 [place], [place].  Smoking Cessation, Tips for Success YOU CAN QUIT SMOKING If you are ready to quit smoking, congratulations! You have chosen to help yourself be healthier. Cigarettes bring nicotine, tar, carbon monoxide, and other irritants into your body. Your lungs, heart, and blood vessels will be able to work better without these poisons. There are lots of different ways to quit smoking. Nicotine gum, nicotine patches, a nicotine inhaler, or nicotine nasal spray help with physical craving. Hypnosis, support groups, and medicines help break the habit of smoking. Here are some tips to help you quit for good. 1. Throw away all cigarettes.  2. Clean and remove all ashtrays from your home, work, and car.  3. On a card, write down your reasons for quitting. Carry the card with you and read it when you get the urge to smoke.  4. Cleanse your body of nicotine. Drink enough water and fluids to keep your urine clear or pale yellow. Do this after quitting to flush the nicotine from your body.  5. Learn to predict  your moods. Do not let a bad situation be your excuse to have a cigarette. Some situations in your life might tempt you into wanting a cigarette.  6. Never have "just one" cigarette. It leads to wanting another and another. Remind yourself of your decision to quit.  7. Change habits associated with smoking. If you smoked while driving or when feeling stressed, try other activities to replace smoking. Stand up when drinking your coffee. Brush your teeth  after eating. Sit in a different chair when you read the paper. Avoid alcohol while trying to quit, and try to drink fewer caffeinated beverages. Alcohol and caffeine may urge you to smoke.  8. Avoid foods and drinks that can trigger a desire to smoke, such as sugary or spicy foods and alcohol.  9. Ask people who smoke not to smoke around you.  10. Have something planned to do right after eating or having a cup of coffee. Take a walk or exercise to perk you up. This will help to keep you from overeating.  11. Try a relaxation exercise to calm you down and decrease your stress. Remember, you may be tense and nervous in the first 2 weeks after you quit, but this will pass.  12. Find new activities to keep your hands busy. Play with a pen, coin, or rubber band. Doodle or draw things on paper.  13. Brush your teeth right after eating. This will help cut down on the craving for the taste of tobacco after meals. You can try mouthwash, too.  14. Use oral substitutes, such as lemon drops, carrots, a cinnamon stick, or chewing gum, in place of cigarettes. Keep them handy so they are available when you have the urge to smoke.  15. When you have the urge to smoke, try deep breathing.  16. Designate your home as a nonsmoking area.  17. If you are a heavy smoker, ask your caregiver about a prescription for nicotine chewing gum. It can ease your withdrawal from nicotine.  18. Reward yourself. Set aside the cigarette money you save and buy yourself something nice.  19. Look for support from others. Join a support group or smoking cessation program. Ask someone at home or at work to help you with your plan to quit smoking.  20. Always ask yourself, "Do I need this cigarette or is this just a reflex?" Tell yourself, "Today, I choose not to smoke," or "I do not want to smoke." You are reminding yourself of your decision to quit, even if you do smoke a cigarette.  HOW WILL I FEEL WHEN I QUIT SMOKING?  The benefits of  not smoking start within days of quitting.   You may have symptoms of withdrawal because your body is used to nicotine (the addictive substance in cigarettes). You may crave cigarettes, be irritable, feel very hungry, cough often, get headaches, or have difficulty concentrating.   The withdrawal symptoms are only temporary. They are strongest when you first quit but will go away within 10 to 14 days.   When withdrawal symptoms occur, stay in control. Think about your reasons for quitting. Remind yourself that these are signs that your body is healing and getting used to being without cigarettes.   Remember that withdrawal symptoms are easier to treat than the major diseases that smoking can cause.   Even after the withdrawal is over, expect periodic urges to smoke. However, these cravings are generally short-lived and will go away whether you smoke or not. Do not  smoke!   If you relapse and smoke again, do not lose hope. Of the people who quit, 75% relapse. Most smokers quit 3 times before they are successful.   If you relapse, do not give up! Plan ahead and think about what you will do the next time you get the urge to smoke.  LIFE AS A NONSMOKER: MAKE IT FOR A MONTH, MAKE IT FOR LIFE Day 1 Hang this page where you will see it every day. Day 2 Get rid of all ashtrays, matches, and lighters. Day 3 Drink water. Breathe deeply between sips. Day 4 Avoid places with smoke-filled air, such as bars, clubs, or the smoking section of restaurants. Day 5 Keep track of how much money you save by not smoking. Day 6 Avoid boredom. Keep a good book with you or go to the movies. Day 7 Reward yourself! One week without smoking! Day 8 Make a dental appointment to get your teeth cleaned. Day 9 Decide how you will turn down a cigarette before it is offered to you. Day 10 Review your reasons for quitting. Day 11 Distract yourself. Stay active to keep your mind off smoking and to relieve tension. Take a walk,  exercise, read a book, do a crossword puzzle, or try a new hobby. Day 12 Exercise. Get off the bus before your stop or use stairs instead of escalators. Day 13 Call on friends for support and encouragement. Day 14 Reward yourself! Two weeks without smoking! Day 15 Practice deep breathing exercises. Day 16 Bet a friend that you can stay a nonsmoker. Day 17 Ask to sit in nonsmoking sections of restaurants. Day 18 Hang up "No Smoking" signs. Day 19 Think of yourself as a nonsmoker. Day 20 Each morning, tell yourself you will not smoke. Day 21 Reward yourself! Three weeks without smoking! Day 22 Think of smoking in negative ways.  Remember how it stains your teeth, gives you bad breath, and shortens your breath. Day 23 Eat a nutritious breakfast. Day 24 Do not relive your days as a smoker. Day 25 Hold a pencil in your hand when talking on the telephone. Day 26 Tell all your friends you do not smoke. Day 27 Think about how much better food tastes. Day 28 Remember, one cigarette is one too many. Day 29 Take up a hobby that will keep your hands busy. Day 30 Congratulations! One month without smoking! Give yourself a big reward. Your caregiver can direct you to community resources or hospitals for support, which may include:  Group support.   Education.   Hypnosis.   Subliminal therapy.  Document Released: 12/17/2003 Document Re-Released: 06/14/2009 [facility] Patient Information 2011 [place], [place].

## 2010-12-14 NOTE — Progress Notes (Signed)
Subjective:     Patient ID: Connor Cuevas, male   DOB: 1940-08-14, 70 y.o.   MRN: [id number]  HPI  A pleasant male with a left groin swelling for several years. He had a right inguinal hernia repaired open with mesh in 2002. He feels the same as happening on the left side. He saw his primary care physician. Because his gotten slightly larger and more sensitive, it was recommended he consider surgery repair. His wife leaned on him to do that as well.  The patient does have problems there was mild constipation. He does smoke. He's try to quit several times. He claims he can walk several miles a week. He does have COPD deplaned it is rather stable. Colonoscopy 2009 showing polyps only. No history of MRSA with skin infections.  Review of Systems  Constitutional: Negative for fever, chills and diaphoresis.  HENT: Negative for nosebleeds, sore throat, facial swelling, mouth sores, trouble swallowing and ear discharge.   Eyes: Negative for photophobia, discharge and visual disturbance.  Respiratory: Negative for choking, chest tightness, shortness of breath and stridor.   Cardiovascular: Negative for chest pain and palpitations.  Gastrointestinal: Negative for nausea, vomiting, abdominal pain, diarrhea, constipation, blood in stool, abdominal distention, anal bleeding and rectal pain.  Genitourinary: Negative for dysuria, urgency, frequency, flank pain, penile swelling, scrotal swelling, difficulty urinating, penile pain and testicular pain.  Musculoskeletal: Negative for myalgias, back pain, arthralgias and gait problem.  Skin: Negative for color change, pallor, rash and wound.  Neurological: Negative for dizziness, speech difficulty, weakness, numbness and headaches.  Hematological: Negative for adenopathy. Does not bruise/bleed easily.  Psychiatric/Behavioral: Negative for hallucinations, confusion and agitation.       Objective:   Physical Exam  Constitutional: He is oriented to person,  place, and time. He appears well-developed and well-nourished. No distress.  HENT:  Head: Normocephalic.  Mouth/Throat: Oropharynx is clear and moist. No oropharyngeal exudate.  Eyes: Conjunctivae and EOM are normal. Pupils are equal, round, and reactive to light. No scleral icterus.  Neck: Normal range of motion. Neck supple. No tracheal deviation present.  Cardiovascular: Normal rate, regular rhythm and intact distal pulses.   Pulmonary/Chest: Effort normal and breath sounds normal. No respiratory distress.  Abdominal: Soft. He exhibits no distension. There is no tenderness. Hernia confirmed negative in the right inguinal area and confirmed negative in the left inguinal area.       Small umb hernia in stalk on Valsalva  Genitourinary: Penis normal. No penile tenderness.       LIH reducible in groin only.  R groin no hernia.  Musculoskeletal: Normal range of motion. He exhibits no tenderness.  Lymphadenopathy:    He has no cervical adenopathy.       Right: No inguinal adenopathy present.       Left: No inguinal adenopathy present.  Neurological: He is alert and oriented to person, place, and time. No cranial nerve deficit. He exhibits normal muscle tone. Coordination normal.  Skin: Skin is warm and dry. No rash noted. He is not diaphoretic. No erythema. No pallor.  Psychiatric: He has a normal mood and affect. His behavior is normal. Judgment and thought content normal.       Assessment:     Left inguinal & umb herniae.    Plan:     Consider surgery to repair her hernias. The umbilical one is small & should be able to be done primarily.  The anatomy & physiology of the abdominal wall was discussed.  The pathophysiology of hernias was discussed.  Natural history risks without surgery of enlargement, pain, incarceration & strangulation was discussed.   Contributors to complications such as smoking, obesity, diabetes, prior surgery, etc were discussed.  I feel the risks of no  intervention will lead to serious problems that outweigh the operative risks; therefore, I recommended surgery to reduce and repair the hernia.  I explained laparoscopic techniques with possible need for an open approach.  I noted the probable use of mesh to patch and/or buttress hernia repair  Risks such as bleeding, infection, abscess, need for further treatment, heart attack, death, and other risks were discussed.  Goals of post-operative recovery were discussed as well.  Possibility that this will not correct all symptoms was explained.  I stressed the importance of low-impact activity, aggressive pain control, avoiding constipation, & not pushing through pain to minimize risk of post-operative chronic pain or injury. Possibility of reherniation was discussed.  We will work to minimize complications.   An educational handout further explaining the pathology & treatment options was given as well.  Questions were answered.  The patient expresses understanding & wishes to proceed with surgery.

## 2011-01-13 DIAGNOSIS — K4090 Unilateral inguinal hernia, without obstruction or gangrene, not specified as recurrent: Secondary | ICD-10-CM

## 2011-01-13 DIAGNOSIS — K429 Umbilical hernia without obstruction or gangrene: Secondary | ICD-10-CM

## 2011-01-13 HISTORY — PX: OTHER SURGICAL HISTORY: SHX169

## 2011-01-31 ENCOUNTER — Encounter (INDEPENDENT_AMBULATORY_CARE_PROVIDER_SITE_OTHER): Payer: Self-pay | Admitting: Surgery

## 2011-01-31 ENCOUNTER — Ambulatory Visit (INDEPENDENT_AMBULATORY_CARE_PROVIDER_SITE_OTHER): Payer: Medicare Other | Admitting: Surgery

## 2011-01-31 VITALS — BP 124/84 | HR 60 | Temp 97.1°F | Resp 20 | Ht 69.5 in | Wt 150.1 lb

## 2011-01-31 DIAGNOSIS — K458 Other specified abdominal hernia without obstruction or gangrene: Secondary | ICD-10-CM

## 2011-01-31 DIAGNOSIS — K4090 Unilateral inguinal hernia, without obstruction or gangrene, not specified as recurrent: Secondary | ICD-10-CM

## 2011-01-31 DIAGNOSIS — K429 Umbilical hernia without obstruction or gangrene: Secondary | ICD-10-CM

## 2011-01-31 NOTE — Progress Notes (Signed)
Subjective:     Patient ID: Connor Cuevas, male   DOB: 07/14/1940, 70 y.o.   MRN: [id number]  HPI  Patient Care Team: Sissy Hoff as PCP - General (Family Medicine)  This patient is a 70 y.o.male who presents today for surgical evaluation.   Procedure: Laparoscopic left inguinal an obturator hernia and umbilical hernia repairs. 01/13/2011  Patient comes today feeling well. Minimal pain. Off all pain medications. He is urinating fine. No fevers chills or sweats.  Having regular bowel movements. Back to regular activity  Past Medical History  Diagnosis Date  . Anxiety   . COPD (chronic obstructive pulmonary disease)   . Hyperlipidemia   . Stenosis of iliac artery     Aortosclerosis, bilateral - mild with calcification. Per Dr. Azucena Cecil at [facility]   . Nasal congestion     Past Surgical History  Procedure Date  . Eye surgery     cataract  . Ureteroscopy 2010 and 2004  . Hernia repair 2002    Dr. Samuella Cota.  open w mesh  . Inguinal hernia repair 01/13/11    left inguinal & obturator hernia repairs   . Hernia repair 01/13/2011    primary umbilical hernia repair    History   Social History  . Marital Status: Married    Spouse Name: N/A    Number of Children: N/A  . Years of Education: N/A   Occupational History  . Not on file.   Social History Main Topics  . Smoking status: Current Everyday Smoker -- 1.0 packs/day for 52 years    Types: Cigarettes  . Smokeless tobacco: Never Used  . Alcohol Use: No  . Drug Use: No  . Sexually Active:    Other Topics Concern  . Not on file   Social History Narrative  . No narrative on file    History reviewed. No pertinent family history.  Current outpatient prescriptions:aspirin 81 MG tablet, Take 81 mg by mouth daily.  , Disp: , Rfl: ;  cefdinir (OMNICEF) 300 MG capsule, Take 300 mg by mouth every 12 (twelve) hours.  , Disp: , Rfl: ;  latanoprost (XALATAN) 0.005 % ophthalmic solution, 1 drop at bedtime.  , Disp: ,  Rfl: ;  LORazepam (ATIVAN) 0.5 MG tablet, Take 0.5 mg by mouth as needed.  , Disp: , Rfl:  Omega-3 Fatty Acids (FISH OIL) 1000 MG CAPS, Take by mouth daily.  , Disp: , Rfl: ;  pseudoephedrine (SUDAFED) 30 MG tablet, Take 30 mg by mouth as needed.  , Disp: , Rfl: ;  rosuvastatin (CRESTOR) 20 MG tablet, Take 20 mg by mouth daily.  , Disp: , Rfl: ;  tiotropium (SPIRIVA) 18 MCG inhalation capsule, Place 18 mcg into inhaler and inhale daily.  , Disp: , Rfl: ;  traZODone (DESYREL) 50 MG tablet, , Disp: , Rfl:   Allergies  Allergen Reactions  . Macrobid Anaphylaxis       Review of Systems  Constitutional: Negative for fever, chills and diaphoresis.  HENT: Negative for sore throat, trouble swallowing and neck pain.   Eyes: Negative for photophobia and visual disturbance.  Respiratory: Negative for choking and shortness of breath.   Cardiovascular: Negative for chest pain and palpitations.  Gastrointestinal: Negative for nausea, vomiting, abdominal distention, anal bleeding and rectal pain.  Genitourinary: Negative for dysuria, urgency, difficulty urinating and testicular pain.  Musculoskeletal: Negative for myalgias, arthralgias and gait problem.  Skin: Negative for color change and rash.  Neurological: Negative for dizziness,  speech difficulty, weakness and numbness.  Hematological: Negative for adenopathy.  Psychiatric/Behavioral: Negative for hallucinations, confusion and agitation.       Objective:   Physical Exam  Constitutional: He is oriented to person, place, and time. He appears well-developed and well-nourished. No distress.  HENT:  Head: Normocephalic.  Mouth/Throat: Oropharynx is clear and moist. No oropharyngeal exudate.  Eyes: Conjunctivae and EOM are normal. Pupils are equal, round, and reactive to light. No scleral icterus.  Neck: Normal range of motion. No tracheal deviation present.  Cardiovascular: Normal rate, normal heart sounds and intact distal pulses.     Pulmonary/Chest: Effort normal. No respiratory distress.  Abdominal: Soft. He exhibits no distension. There is no tenderness. Hernia confirmed negative in the right inguinal area and confirmed negative in the left inguinal area.       Incisions clean with normal healing ridges.  No hernias  Genitourinary: Penis normal. No penile tenderness.       2x2cm seroma left groin  Musculoskeletal: Normal range of motion. He exhibits no tenderness.  Neurological: He is alert and oriented to person, place, and time. No cranial nerve deficit. He exhibits normal muscle tone. Coordination normal.  Skin: Skin is warm and dry. No rash noted. He is not diaphoretic.  Psychiatric: He has a normal mood and affect. His behavior is normal.       Assessment:     ~ 2weeks s/p Lap ingunial & umb & obturator hernia repairs, recovering well    Plan:     Increase activity as tolerated.  Do not push through pain.  Advanced on diet as tolerated. Bowel regimen to avoid problems.  Return to clinic p.r.n. The patient expressed understanding and appreciation

## 2011-04-04 DIAGNOSIS — K7689 Other specified diseases of liver: Secondary | ICD-10-CM

## 2011-04-04 HISTORY — DX: Other specified diseases of liver: K76.89

## 2011-08-14 ENCOUNTER — Other Ambulatory Visit: Payer: Self-pay | Admitting: Family Medicine

## 2011-08-14 DIAGNOSIS — R911 Solitary pulmonary nodule: Secondary | ICD-10-CM

## 2011-08-17 ENCOUNTER — Ambulatory Visit
Admission: RE | Admit: 2011-08-17 | Discharge: 2011-08-17 | Disposition: A | Discharge status: Home / Self Care | Payer: Medicare Other | Source: Ambulatory Visit | Attending: Family Medicine | Admitting: Family Medicine

## 2011-08-17 DIAGNOSIS — R911 Solitary pulmonary nodule: Secondary | ICD-10-CM

## 2011-08-17 MED ORDER — IOHEXOL 300 MG/ML  SOLN
75.0000 mL | Freq: Once | INTRAMUSCULAR | Status: AC | PRN
  Administered 2011-08-17: 75 mL via INTRAVENOUS

## 2011-08-18 ENCOUNTER — Other Ambulatory Visit: Payer: Self-pay | Admitting: Family Medicine

## 2011-08-18 DIAGNOSIS — I714 Abdominal aortic aneurysm, without rupture: Secondary | ICD-10-CM

## 2011-08-21 ENCOUNTER — Ambulatory Visit
Admission: RE | Admit: 2011-08-21 | Discharge: 2011-08-21 | Disposition: A | Discharge status: Home / Self Care | Payer: Medicare Other | Source: Ambulatory Visit | Attending: Family Medicine | Admitting: Family Medicine

## 2011-08-21 DIAGNOSIS — I714 Abdominal aortic aneurysm, without rupture: Secondary | ICD-10-CM

## 2011-08-21 IMAGING — CT CT ABD-PELV W/ CM
2 of 5 series · 16 of 46 positions shown, 18 images · IV contrast (READICAT/WATER & [ID] OMNI 300)
Comparison: Nuclear medicine PET CT from [DATE]; CT chest from
[DATE]

CLINICAL DATA: Prominent abdominal aorta noted on CT chest.

CT ABDOMEN AND PELVIS WITH CONTRAST
TECHNIQUE: Multidetector CT imaging of the abdomen and pelvis was
performed following the standard protocol during bolus
administration of intravenous contrast.
Contrast: 100mL OMNIPAQUE IOHEXOL 300 MG/ML  SOLN

[Series 2: abd/pelvis with · axial · 0.70mm/px · z∈[-386,-0]mm · 13 of 86 slices shown, 15 images]
[im 5/86  soft-tissue]
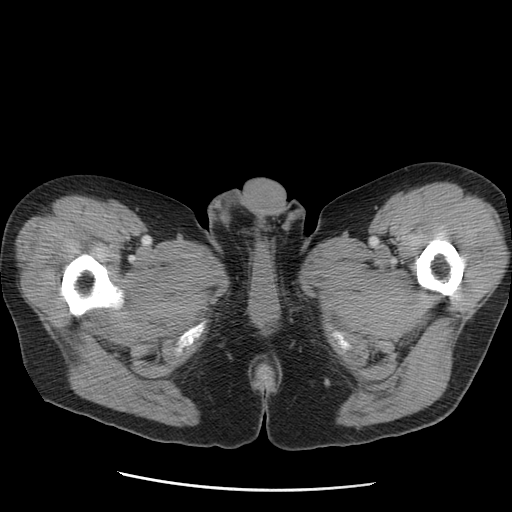
[im 5/86  bone]
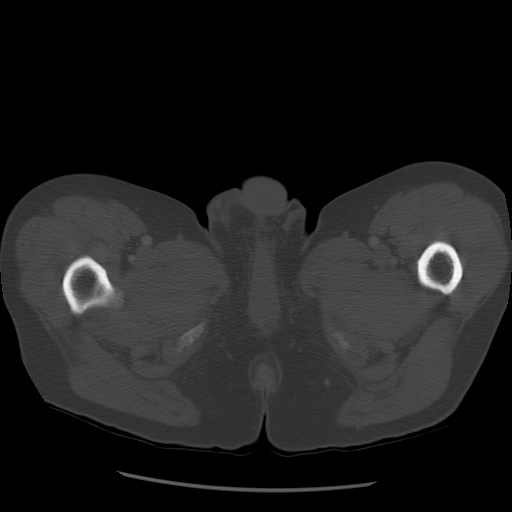
[im 13/86  soft-tissue]
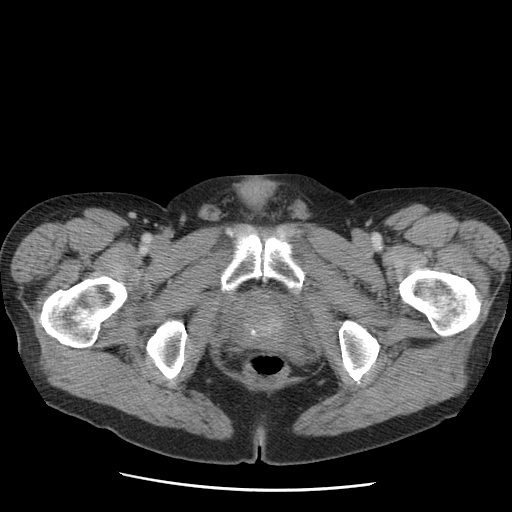
[im 18/86  soft-tissue]
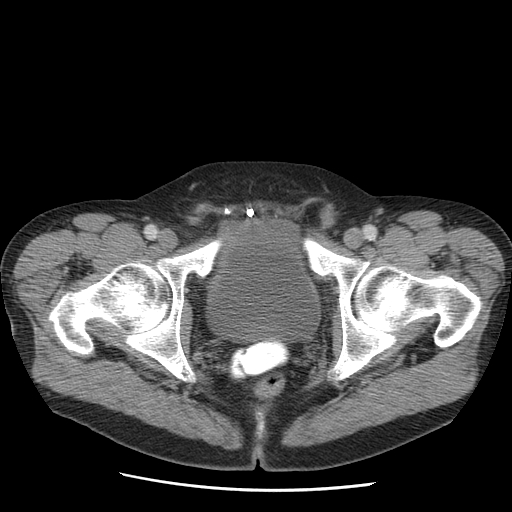
[im 26/86  soft-tissue]
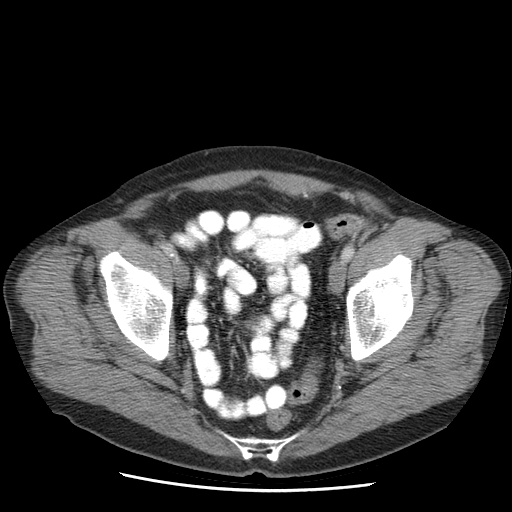
[im 30/86  soft-tissue]
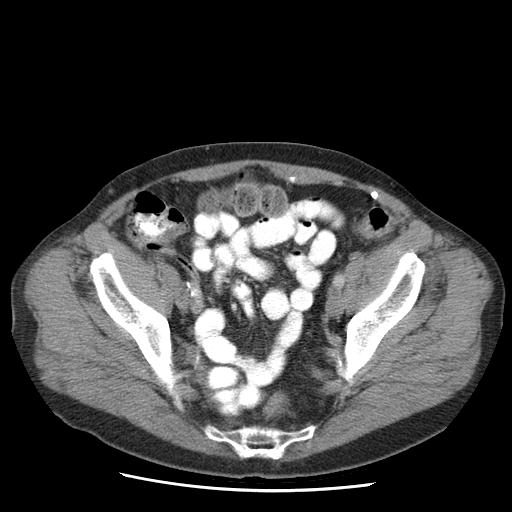
[im 39/86  soft-tissue]
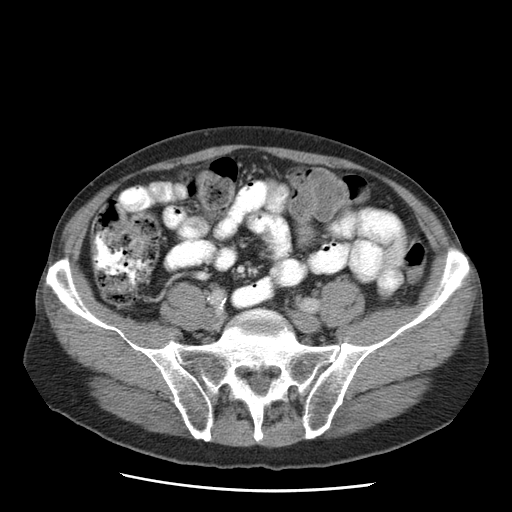
[im 43/86  soft-tissue]
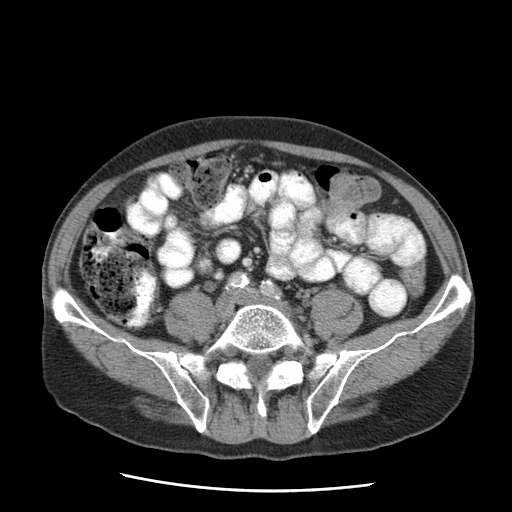
[im 47/86  soft-tissue]
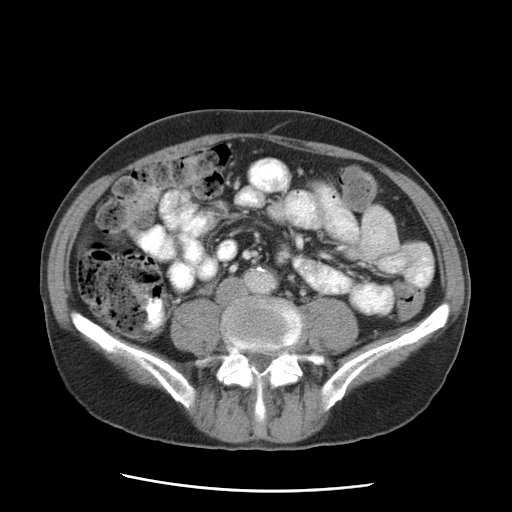
[im 56/86  soft-tissue]
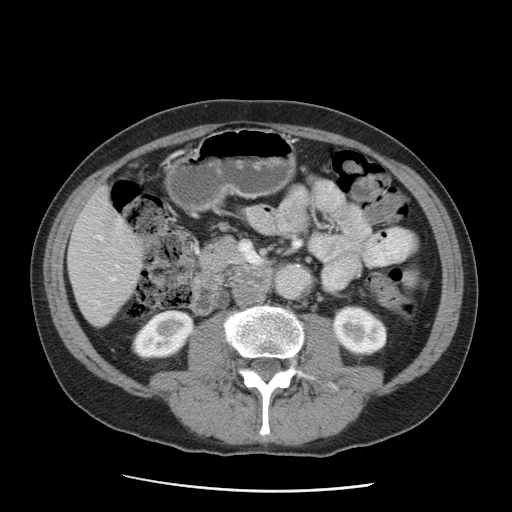
[im 56/86  bone]
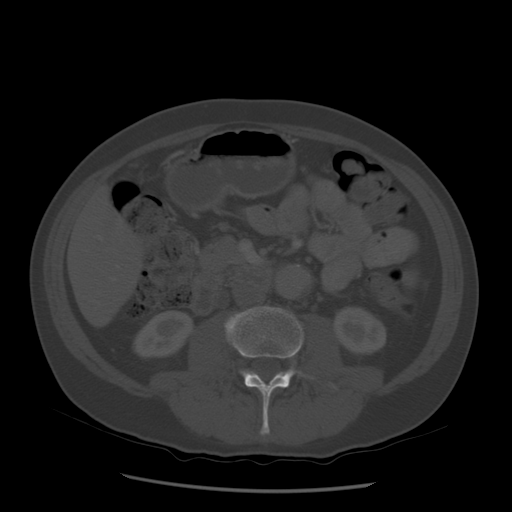
[im 60/86  soft-tissue]
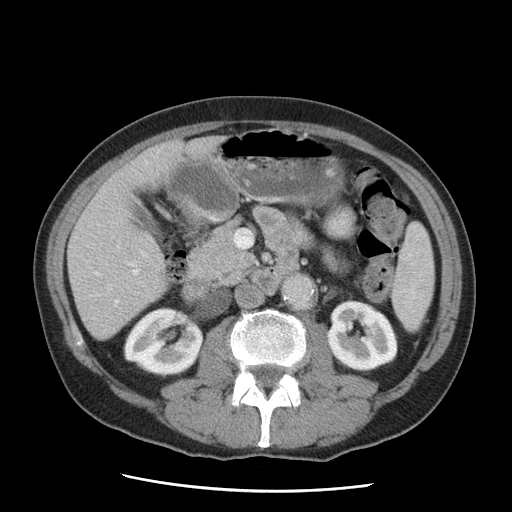
[im 69/86  soft-tissue]
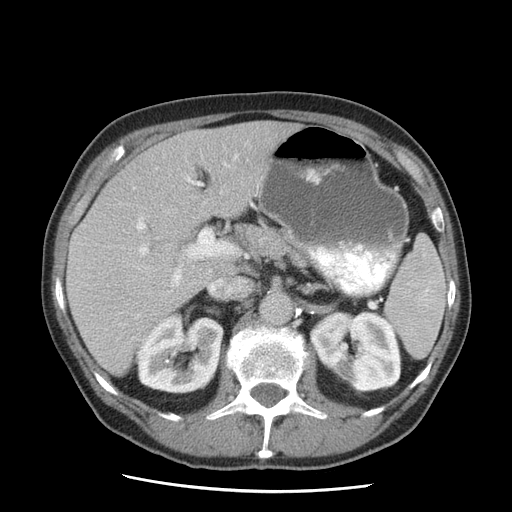
[im 73/86  soft-tissue]
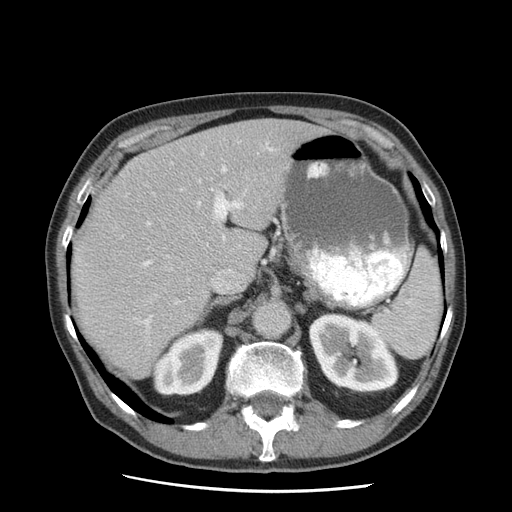
[im 81/86  soft-tissue]
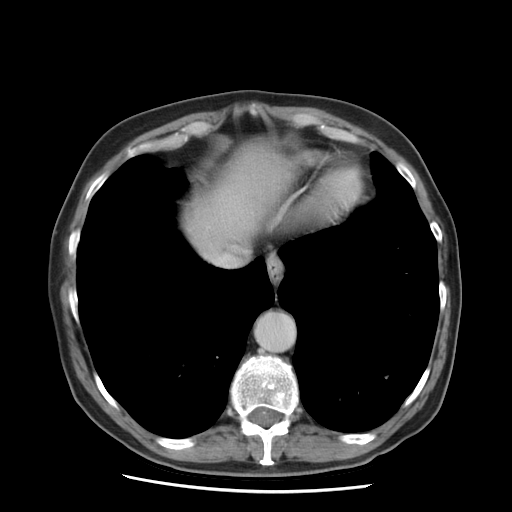

[Series 400: coronal · coronal · 0.86mm/px · 3 of 112 slices shown]
[im 38/112  soft-tissue]
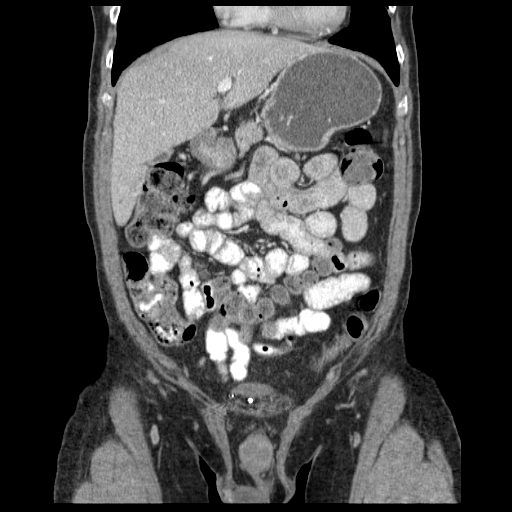
[im 50/112  soft-tissue]
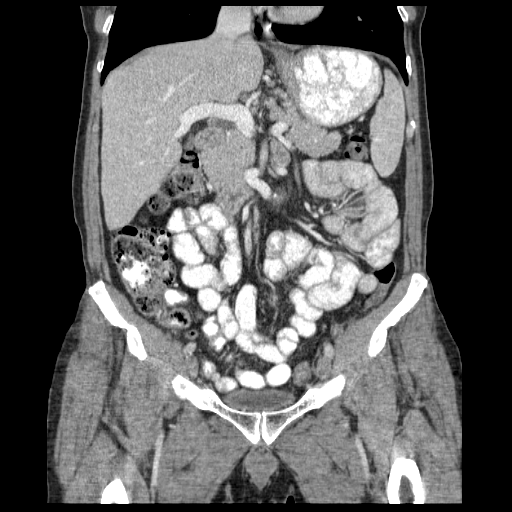
[im 62/112  soft-tissue]
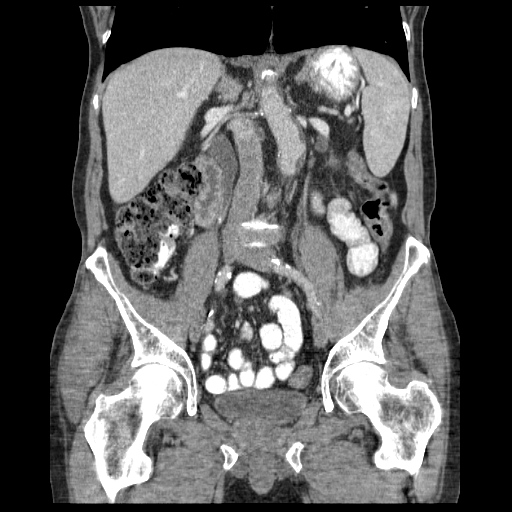

[16 of 46 positions shown; findings below may reference images not displayed]

FINDINGS: Mild scarring noted in the right lower lobe.

A 4 mm hypodense lesion in the dome of the left hepatic lobe on
image 8 of series 2 is stable in [YM] and likely a cyst or similar
benign lesion.  Several tiny hypodense lesions inferiorly in the
right hepatic lobe are likewise likely benign.

The spleen, pancreas, and adrenal glands appear normal.  The
gallbladder is contracted but otherwise unremarkable.

Left mid kidney scarring appears similar to [YM], and is associated
with a central calcification which may be in a calyceal
diverticulum.  Bilateral nonobstructive nephrolithiasis is again
noted, with approximately five right-sided calculi measuring up to
5 mm in size, and with [DATE] left-sided calculi.

There is also a 4 mm right proximal ureteral calculus on image 38
of series 2 associated with mild right hydronephrosis.  The right
ureter distal to this stone is normal in caliber, no left ureteral
calculus is observed. The left a small left kidney lower pole cyst
is noted.

Fusiform dilation of the infrarenal abdominal aorta to 3 cm is
compatible with a minimal abdominal aortic aneurysm.  Chronic
atherosclerotic calcification of the aortoiliac tree is noted.

The appendix appears normal.  No dilated small bowel is observed.

Mesh markers noted along the inferior aspect of the anterior pelvic
wall.  No free pelvic fluid is observed.  Central prostate
calcifications are noted.  The urinary bladder appears
unremarkable.

Disc bulges noted at L4-5 and L5-S1.  There is also a disc
osteophyte complex at the L3-4 level.
IMPRESSION: 1.  Small (3.0 cm) fusiform infrarenal abdominal aortic aneurysm.
2.  Bilateral nephrolithiasis, with a 4 mm right proximal ureteral
calculus causing minimal right hydronephrosis.
3.  Mild scarring in the right lower lobe.
4.  Several tiny hepatic cysts are noted.
5.  Lower lumbar disc bulges, without definite impingement.

## 2011-08-21 MED ORDER — IOHEXOL 300 MG/ML  SOLN
100.0000 mL | Freq: Once | INTRAMUSCULAR | Status: AC | PRN
  Administered 2011-08-21: 100 mL via INTRAVENOUS

## 2011-08-24 ENCOUNTER — Emergency Department (HOSPITAL_COMMUNITY)
Admission: EM | Admit: 2011-08-24 | Discharge: 2011-08-24 | Disposition: A | Discharge status: Home / Self Care | Payer: Medicare Other | Attending: Emergency Medicine | Admitting: Emergency Medicine

## 2011-08-24 ENCOUNTER — Emergency Department (HOSPITAL_COMMUNITY): Payer: Medicare Other

## 2011-08-24 ENCOUNTER — Encounter (HOSPITAL_COMMUNITY): Payer: Self-pay

## 2011-08-24 DIAGNOSIS — E785 Hyperlipidemia, unspecified: Secondary | ICD-10-CM | POA: Insufficient documentation

## 2011-08-24 DIAGNOSIS — Z7982 Long term (current) use of aspirin: Secondary | ICD-10-CM | POA: Insufficient documentation

## 2011-08-24 DIAGNOSIS — N1330 Unspecified hydronephrosis: Secondary | ICD-10-CM | POA: Insufficient documentation

## 2011-08-24 DIAGNOSIS — Z79899 Other long term (current) drug therapy: Secondary | ICD-10-CM | POA: Insufficient documentation

## 2011-08-24 DIAGNOSIS — M25559 Pain in unspecified hip: Secondary | ICD-10-CM | POA: Insufficient documentation

## 2011-08-24 DIAGNOSIS — N200 Calculus of kidney: Secondary | ICD-10-CM

## 2011-08-24 DIAGNOSIS — M545 Low back pain, unspecified: Secondary | ICD-10-CM | POA: Insufficient documentation

## 2011-08-24 DIAGNOSIS — N201 Calculus of ureter: Secondary | ICD-10-CM | POA: Insufficient documentation

## 2011-08-24 DIAGNOSIS — J4489 Other specified chronic obstructive pulmonary disease: Secondary | ICD-10-CM | POA: Insufficient documentation

## 2011-08-24 DIAGNOSIS — R109 Unspecified abdominal pain: Secondary | ICD-10-CM | POA: Insufficient documentation

## 2011-08-24 DIAGNOSIS — N509 Disorder of male genital organs, unspecified: Secondary | ICD-10-CM | POA: Insufficient documentation

## 2011-08-24 DIAGNOSIS — J449 Chronic obstructive pulmonary disease, unspecified: Secondary | ICD-10-CM | POA: Insufficient documentation

## 2011-08-24 LAB — POCT I-STAT, CHEM 8
Calcium, Ion: 1.17 mmol/L (ref 1.12–1.32)
Chloride: 108 mEq/L (ref 96–112)
Creatinine, Ser: 0.7 mg/dL (ref 0.50–1.35)
Glucose, Bld: 95 mg/dL (ref 70–99)
Hemoglobin: 17 g/dL (ref 13.0–17.0)
Potassium: 4.1 mEq/L (ref 3.5–5.1)
Sodium: 139 mEq/L (ref 135–145)
TCO2: 22 mmol/L (ref 0–100)

## 2011-08-24 LAB — CBC
MCV: 90.2 fL (ref 78.0–100.0)
Platelets: 249 10*3/uL (ref 150–400)
RBC: 5.31 MIL/uL (ref 4.22–5.81)
WBC: 11.4 10*3/uL — ABNORMAL HIGH (ref 4.0–10.5)

## 2011-08-24 LAB — DIFFERENTIAL
Basophils Absolute: 0.1 10*3/uL (ref 0.0–0.1)
Eosinophils Absolute: 0.3 10*3/uL (ref 0.0–0.7)
Lymphs Abs: 4 10*3/uL (ref 0.7–4.0)
Monocytes Absolute: 0.8 10*3/uL (ref 0.1–1.0)
Neutro Abs: 6.2 10*3/uL (ref 1.7–7.7)

## 2011-08-24 LAB — URINALYSIS, ROUTINE W REFLEX MICROSCOPIC
Glucose, UA: NEGATIVE mg/dL
Ketones, ur: NEGATIVE mg/dL
Nitrite: NEGATIVE
Specific Gravity, Urine: 1.018 (ref 1.005–1.030)
pH: 6.5 (ref 5.0–8.0)

## 2011-08-24 LAB — BASIC METABOLIC PANEL
CO2: 21 mEq/L (ref 19–32)
Chloride: 101 mEq/L (ref 96–112)
Creatinine, Ser: 0.67 mg/dL (ref 0.50–1.35)
Potassium: 4.2 mEq/L (ref 3.5–5.1)

## 2011-08-24 LAB — URINE MICROSCOPIC-ADD ON

## 2011-08-24 IMAGING — CT CT ABD-PELV W/O CM
1 series · 13 of 15 positions shown, 19 images · non-contrast
Comparison: [DATE]

CLINICAL DATA: Flank pain.

CT ABDOMEN AND PELVIS WITHOUT CONTRAST
TECHNIQUE: Multidetector CT imaging of the abdomen and pelvis was
performed following the standard protocol without intravenous
contrast.

[Series 4: lung · axial · 0.74mm/px · z∈[+1367,+1427]mm · 13 of 15 slices shown, 19 images]
[im 2/15  soft-tissue]
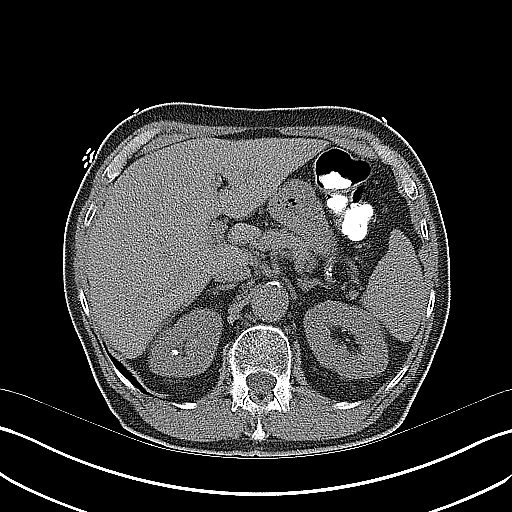
[im 2/15  bone]
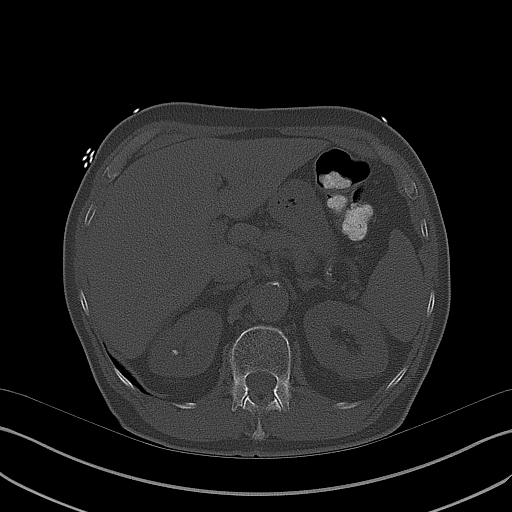
[im 3/15  soft-tissue]
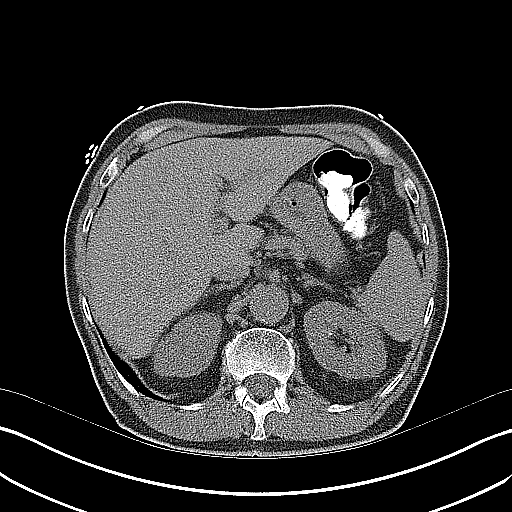
[im 4/15  soft-tissue]
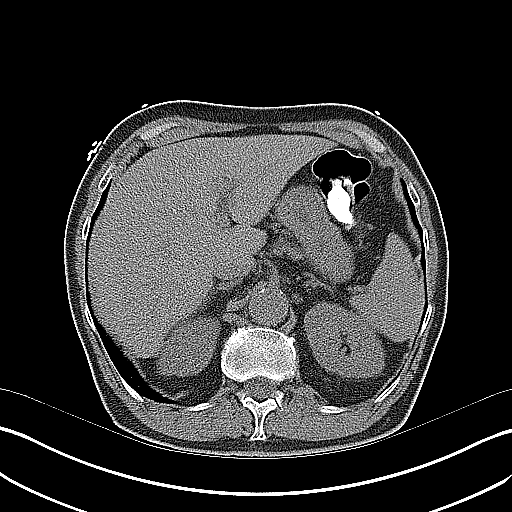
[im 5/15  soft-tissue]
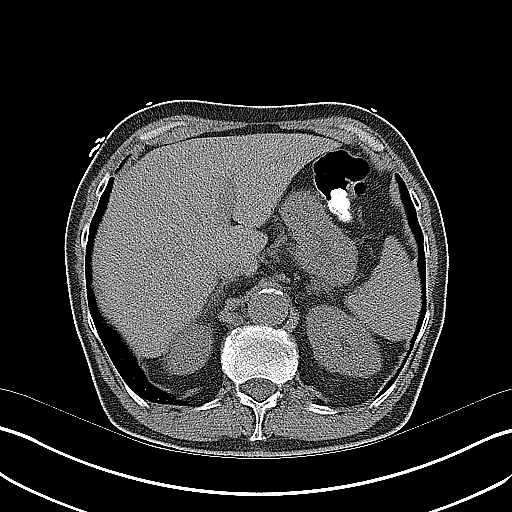
[im 6/15  soft-tissue]
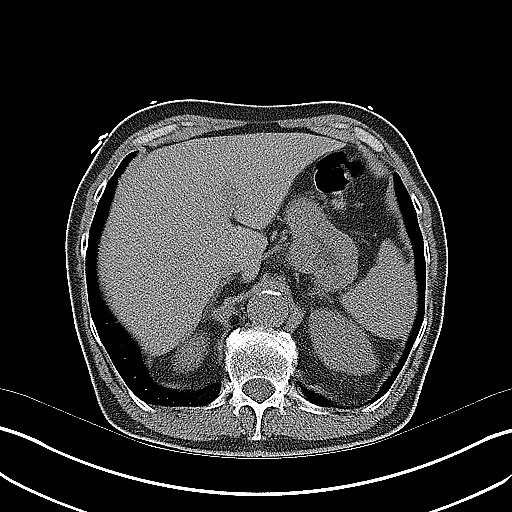
[im 7/15  soft-tissue]
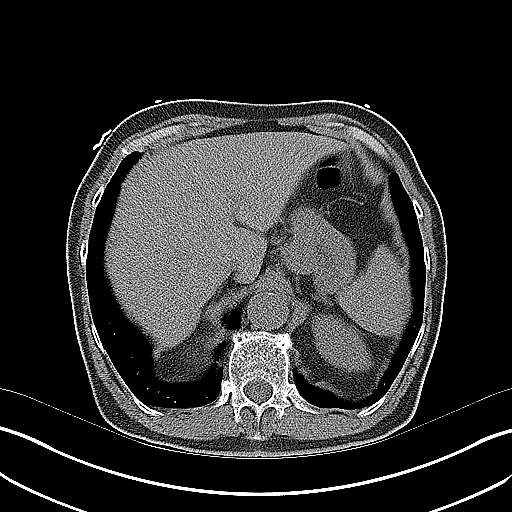
[im 8/15  soft-tissue]
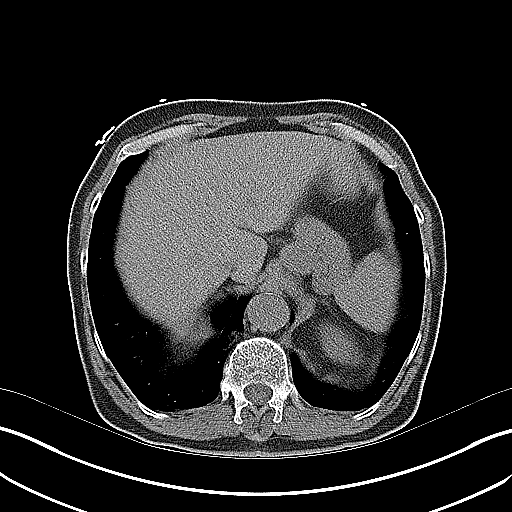
[im 9/15  soft-tissue]
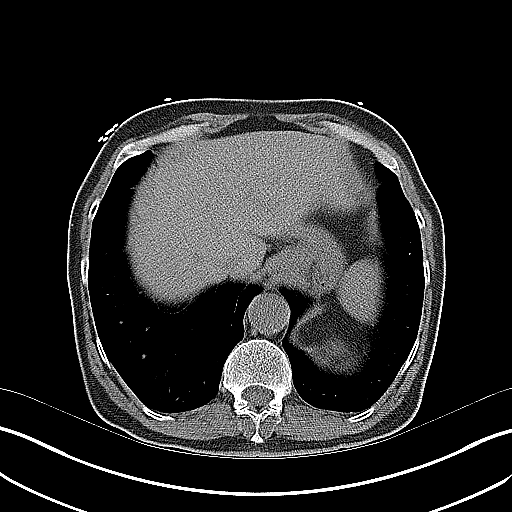
[im 10/15  soft-tissue]
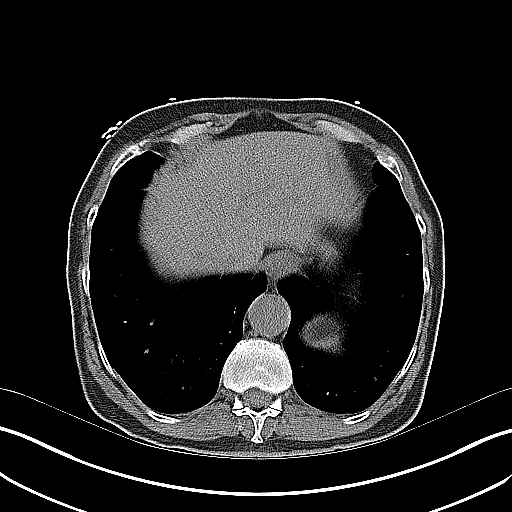
[im 10/15  bone]
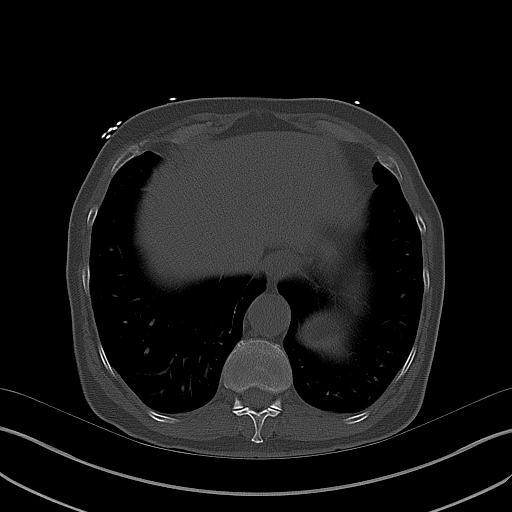
[im 11/15  soft-tissue]
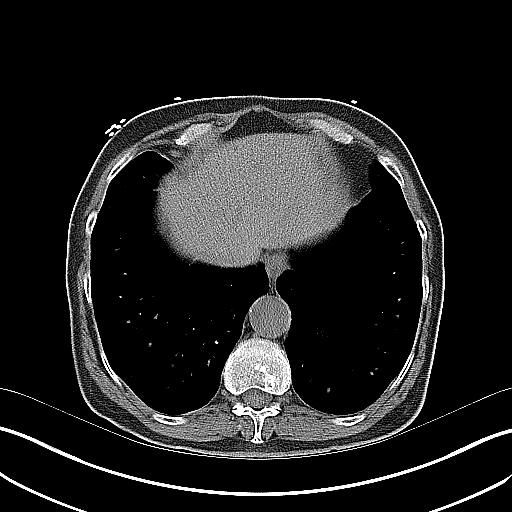
[im 11/15  lung]
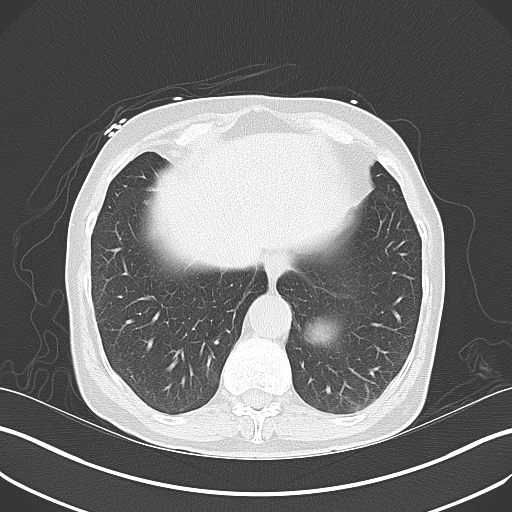
[im 12/15  soft-tissue]
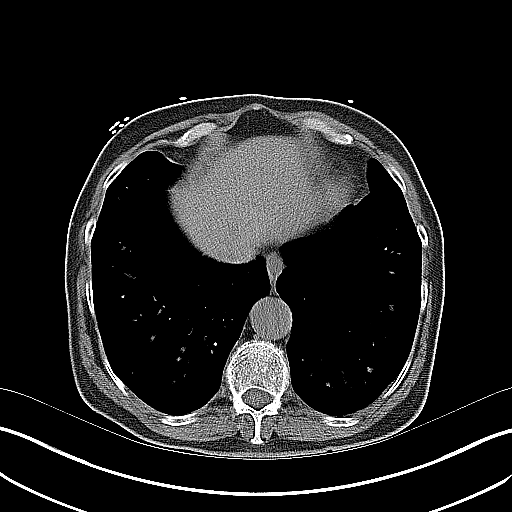
[im 12/15  lung]
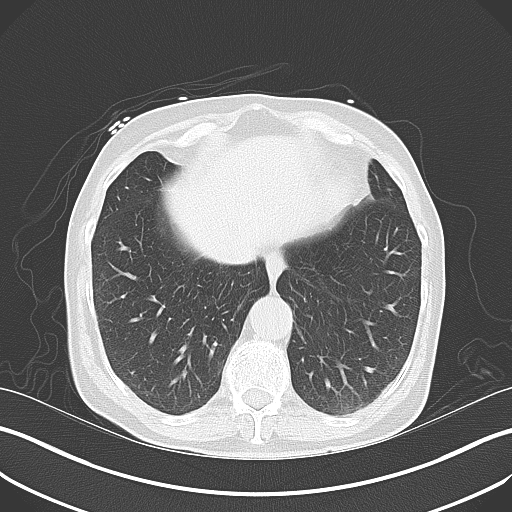
[im 13/15  soft-tissue]
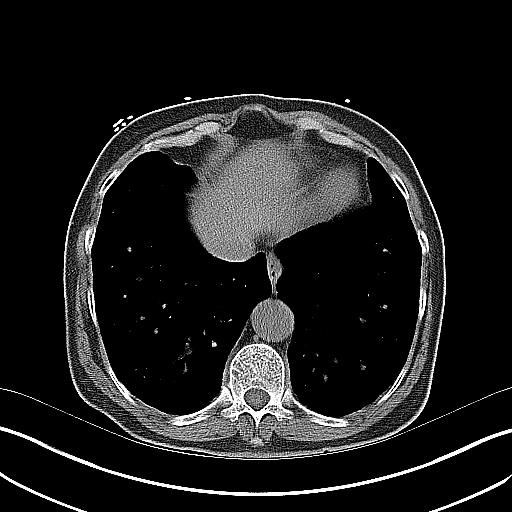
[im 13/15  lung]
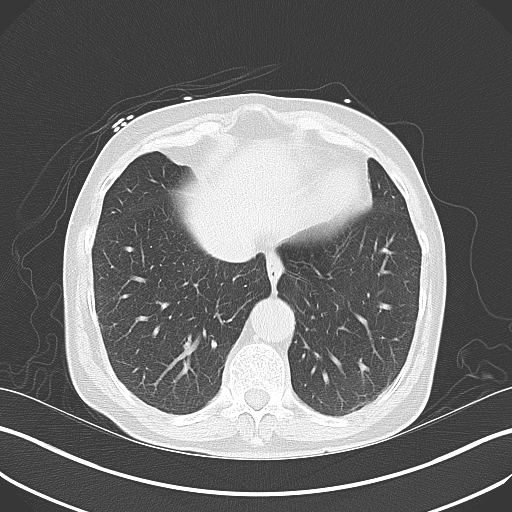
[im 14/15  soft-tissue]
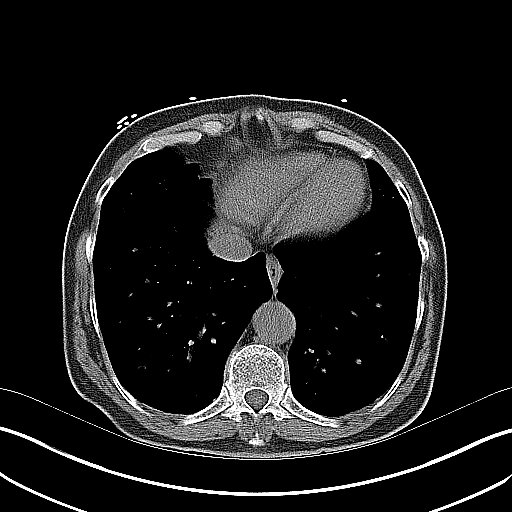
[im 14/15  lung]
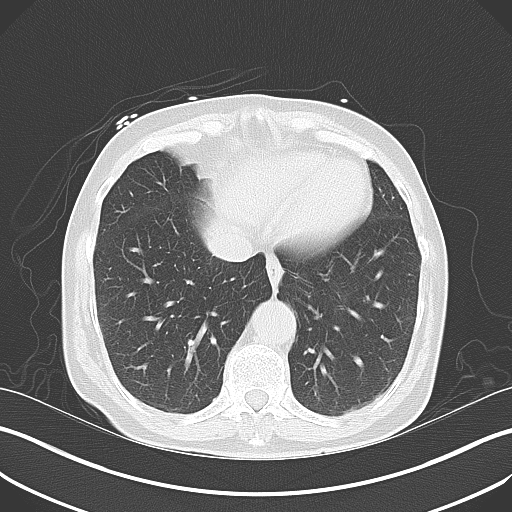

[13 of 15 positions shown; findings below may reference images not displayed]

FINDINGS: Again noted is a 4 mm right ureteral stone causing mild
right hydronephrosis.  There are multiple bilateral renal calculi.

The unenhanced liver, spleen, pancreas, and adrenal glands are
normal.  The bowel appears normal including the terminal ileum and
appendix.  Calcification is present in the slightly enlarged
prostate gland.

Mid abdominal aorta is dilated to an AP dimension of 3 cm, stable.
IMPRESSION: 1.  4 mm right ureteral stone causing mild right hydronephrosis,
unchanged since [DATE].
[DATE].  [DATE] cm abdominal aortic aneurysm, unchanged.

## 2011-08-24 MED ORDER — OXYCODONE-ACETAMINOPHEN 5-325 MG PO TABS: 2.0000 | ORAL_TABLET | Freq: Once | ORAL | Status: DC

## 2011-08-24 MED ORDER — KETOROLAC TROMETHAMINE 30 MG/ML IJ SOLN
30.0000 mg | Freq: Once | INTRAMUSCULAR | Status: AC
  Administered 2011-08-24: 30 mg via INTRAVENOUS
  Filled 2011-08-24: qty 1

## 2011-08-24 MED ORDER — ONDANSETRON HCL 4 MG/2ML IJ SOLN
4.0000 mg | Freq: Once | INTRAMUSCULAR | Status: AC
  Administered 2011-08-24: 4 mg via INTRAVENOUS
  Filled 2011-08-24: qty 2

## 2011-08-24 MED ORDER — SODIUM CHLORIDE 0.9 % IV BOLUS (SEPSIS)
500.0000 mL | Freq: Once | INTRAVENOUS | Status: AC
  Administered 2011-08-24: 500 mL via INTRAVENOUS

## 2011-08-24 MED ORDER — HYDROMORPHONE HCL PF 1 MG/ML IJ SOLN
1.0000 mg | Freq: Once | INTRAMUSCULAR | Status: AC
  Administered 2011-08-24: 1 mg via INTRAVENOUS
  Filled 2011-08-24: qty 1

## 2011-08-24 MED ORDER — IBUPROFEN 600 MG PO TABS: ORAL_TABLET | ORAL | Status: DC

## 2011-08-24 MED ORDER — ONDANSETRON HCL 4 MG PO TABS: 4.0000 mg | ORAL_TABLET | Freq: Four times a day (QID) | ORAL | Status: AC

## 2011-08-24 MED ORDER — SODIUM CHLORIDE 0.9 % IV SOLN
INTRAVENOUS | Status: DC
  Administered 2011-08-24: 08:00:00 via INTRAVENOUS

## 2011-08-24 MED ORDER — ONDANSETRON HCL 4 MG/2ML IJ SOLN: 4.0000 mg | Freq: Once | INTRAMUSCULAR | Status: DC

## 2011-08-24 MED ORDER — FENTANYL CITRATE 0.05 MG/ML IJ SOLN: 50.0000 ug | Freq: Once | INTRAMUSCULAR | Status: DC

## 2011-08-24 MED ORDER — OXYCODONE-ACETAMINOPHEN 5-325 MG PO TABS: ORAL_TABLET | ORAL | Status: AC

## 2011-08-24 NOTE — ED Notes (Signed)
Pt denies nausea and need for pain med.

## 2011-08-24 NOTE — ED Notes (Signed)
Pt complains of right flank pain radiating to his groin, he was dx Tuesday with a Abdominal Aneurysm at a size 3, and small stones in his right kidney

## 2011-08-24 NOTE — Discharge Instructions (Signed)
Take percocet as needed for pain but do not drive or operate machinery with percocet use. Alternate with ibuprofen. Use zofran as needed for nausea. Follow up with urology, Dr. Laverle Patter, in the next few days for further evaluation of ongoing flank/back pain but return to [facility] ER for emergent changing or worsening of symptoms.   Kidney Stones Kidney stones (ureteral lithiasis) are deposits that form inside your kidneys. The intense pain is caused by the stone moving through the urinary tract. When the stone moves, the ureter goes into spasm around the stone. The stone is usually passed in the urine.  CAUSES   A disorder that makes certain neck glands produce too much parathyroid hormone (primary hyperparathyroidism).   A buildup of uric acid crystals.   Narrowing (stricture) of the ureter.   A kidney obstruction present at birth (congenital obstruction).   Previous surgery on the kidney or ureters.   Numerous kidney infections.  SYMPTOMS   Feeling sick to your stomach (nauseous).   Throwing up (vomiting).   Blood in the urine (hematuria).   Pain that usually spreads (radiates) to the groin.   Frequency or urgency of urination.  DIAGNOSIS   Taking a history and physical exam.   Blood or urine tests.   Computerized X-ray scan (CT scan).   Occasionally, an examination of the inside of the urinary bladder (cystoscopy) is performed.  TREATMENT   Observation.   Increasing your fluid intake.   Surgery may be needed if you have severe pain or persistent obstruction.  The size, location, and chemical composition are all important variables that will determine the proper choice of action for you. Talk to your caregiver to better understand your situation so that you will minimize the risk of injury to yourself and your kidney.  HOME CARE INSTRUCTIONS   Drink enough water and fluids to keep your urine clear or pale yellow.   Strain all urine through the provided strainer.  Keep all particulate matter and stones for your caregiver to see. The stone causing the pain may be as small as a grain of salt. It is very important to use the strainer each and every time you pass your urine. The collection of your stone will allow your caregiver to analyze it and verify that a stone has actually passed.   Only take over-the-counter or prescription medicines for pain, discomfort, or fever as directed by your caregiver.   Make a follow-up appointment with your caregiver as directed.   Get follow-up X-rays if required. The absence of pain does not always mean that the stone has passed. It may have only stopped moving. If the urine remains completely obstructed, it can cause loss of kidney function or even complete destruction of the kidney. It is your responsibility to make sure X-rays and follow-ups are completed. Ultrasounds of the kidney can show blockages and the status of the kidney. Ultrasounds are not associated with any radiation and can be performed easily in a matter of minutes.  SEEK IMMEDIATE MEDICAL CARE IF:   Pain cannot be controlled with the prescribed medicine.   You have a fever.   The severity or intensity of pain increases over 18 hours and is not relieved by pain medicine.   You develop a new onset of abdominal pain.   You feel faint or pass out.  MAKE SURE YOU:   Understand these instructions.   Will watch your condition.   Will get help right away if you are not doing  well or get worse.  Document Released: 03/20/2005 Document Revised: 03/09/2011 Document Reviewed: 07/16/2009 [facility] Patient Information 2012 [place].

## 2011-08-24 NOTE — ED Provider Notes (Signed)
Medical screening examination/treatment/procedure(s) were performed by non-physician practitioner and as supervising physician I was immediately available for consultation/collaboration.  Sunnie Nielsen, MD 08/24/11 [phone]

## 2011-08-24 NOTE — ED Notes (Signed)
Family at bedside. 

## 2011-08-24 NOTE — ED Provider Notes (Signed)
History     CSN: [id number]  Arrival date & time 08/24/11  4098   First MD Initiated Contact with Patient 08/24/11 [phone]      Chief Complaint  Patient presents with  . Flank Pain    (Consider location/radiation/quality/duration/timing/severity/associated sxs/prior treatment) HPI  Patient presents to emergency department complaining of gradual onset right lower back/right hip/right lower quadrant pain with radiation of pain towards his right scrotum that began on Tuesday evening, 2 days ago. Patient states the pain started off as a mild pain that he took 1 Vicodin for with good relief of pain however states that throughout the day yesterday he had increasing pain stating that last night he could not get comfortable lying in bed and therefore took another Vicodin at midnight as well as a half of a Vicodin at 2 AM due to discomfort. Patient states that he went to see his primary care physician, Dr. Azucena Cecil, last week for routine physical exam and was sent to have a CT scan of his chest to followup a chest nodule. Incidentally the chest CT showed widening of his aorta and therefore the next day was sent for a CT scan of his abdomen. Patient states that Tuesday morning Dr. Azucena Cecil called to inform him that he had a 3 cm aortic aneurysm however that no intervention would be needed at this time also he informed him that he had a few "tiny" stones in his right kidney. Patient states that he has a history of kidney stones and both 2003 and 2008 however states that his current pain is "a little different" than the past pain associated with stones.Initially pain was alleviated with Vicodin however he now has ongoing pain despite Vicodin use. Denies aggravating factors. Denies fevers, chills, chest pain, shortness of breath, n/v/d, dysuria, hematuria or blood in his stool.  Past Medical History  Diagnosis Date  . Anxiety   . COPD (chronic obstructive pulmonary disease)   . Hyperlipidemia   . Stenosis of  iliac artery     Aortosclerosis, bilateral - mild with calcification. Per Dr. Azucena Cecil at [facility] [place]   . Nasal congestion     Past Surgical History  Procedure Date  . Eye surgery     cataract  . Ureteroscopy 2010 and 2004  . Hernia repair 2002    Dr. Samuella Cota.  open w mesh  . Inguinal hernia repair 01/13/11    left inguinal & obturator hernia repairs   . Hernia repair 01/13/2011    primary umbilical hernia repair    History reviewed. No pertinent family history.  History  Substance Use Topics  . Smoking status: Current Everyday Smoker -- 1.0 packs/day for 52 years    Types: Cigarettes  . Smokeless tobacco: Never Used  . Alcohol Use: No      Review of Systems  All other systems reviewed and are negative.    Allergies  Nitrofurantoin monohyd macro  Home Medications   Current Outpatient Rx  Name Route Sig Dispense Refill  . ASPIRIN 81 MG PO TABS Oral Take 81 mg by mouth daily.      Marland Kitchen HYDROCODONE-ACETAMINOPHEN 5-325 MG PO TABS Oral Take 1 tablet by mouth every 6 (six) hours as needed. For pain    . LATANOPROST 0.005 % OP SOLN Both Eyes Place 1 drop into both eyes at bedtime.     Marland Kitchen LORAZEPAM 0.5 MG PO TABS Oral Take 0.5 mg by mouth daily as needed. For anxiety    . FISH OIL 1000 MG  PO CAPS Oral Take by mouth daily.      Marland Kitchen ROSUVASTATIN CALCIUM 20 MG PO TABS Oral Take 20 mg by mouth daily.      Marland Kitchen TIOTROPIUM BROMIDE MONOHYDRATE 18 MCG IN CAPS Inhalation Place 18 mcg into inhaler and inhale daily.     . TRAZODONE HCL 150 MG PO TABS Oral Take 150 mg by mouth at bedtime.      BP 123/80  Pulse 83  Temp(Src) 97.7 F (36.5 C) (Oral)  Resp 18  SpO2 99%  Physical Exam  Nursing note and vitals reviewed. Constitutional: He is oriented to person, place, and time. He appears well-developed and well-nourished. No distress.  HENT:  Head: Normocephalic and atraumatic.  Eyes: Conjunctivae are normal.  Neck: Normal range of motion. Neck supple.  Cardiovascular: Normal rate,  regular rhythm, normal heart sounds and intact distal pulses.  Exam reveals no gallop and no friction rub.   No murmur heard. Pulmonary/Chest: Effort normal and breath sounds normal. No respiratory distress. He has no wheezes. He has no rales. He exhibits no tenderness.  Abdominal: Soft. Bowel sounds are normal. He exhibits no distension and no mass. There is tenderness. There is no rebound and no guarding.       No CVA TTP. Mild RLQ TTP but no guarding, rigidity or peritoneal signs.   Genitourinary:       Testes descended bilaterally without scrotal swelling and normal penile exam but mild TTP of bilateral testes. No palpable inguinal hernia.   Musculoskeletal: Normal range of motion. He exhibits no edema and no tenderness.       No palpable tenderness of entire back.   Neurological: He is alert and oriented to person, place, and time.  Skin: Skin is warm and dry. No rash noted. He is not diaphoretic. No erythema.  Psychiatric: He has a normal mood and affect.    ED Course  Procedures (including critical care time)  IV dilaudid and zofran.   Labs Reviewed  CBC - Abnormal; Notable for the following:    WBC 11.4 (*)    All other components within normal limits  BASIC METABOLIC PANEL - Abnormal; Notable for the following:    Sodium 134 (*)    All other components within normal limits  URINALYSIS, ROUTINE W REFLEX MICROSCOPIC - Abnormal; Notable for the following:    Leukocytes, UA SMALL (*)    All other components within normal limits  URINE MICROSCOPIC-ADD ON - Abnormal; Notable for the following:    Bacteria, UA FEW (*)    All other components within normal limits  POCT I-STAT, CHEM 8  DIFFERENTIAL   Ct Abdomen Pelvis Wo Contrast  08/24/2011  *RADIOLOGY REPORT*  Clinical Data: Flank pain.  CT ABDOMEN AND PELVIS WITHOUT CONTRAST  Technique:  Multidetector CT imaging of the abdomen and pelvis was performed following the standard protocol without intravenous contrast.  Comparison:  08/21/2011  Findings: Again noted is a 4 mm right ureteral stone causing mild right hydronephrosis.  There are multiple bilateral renal calculi.  The unenhanced liver, spleen, pancreas, and adrenal glands are normal.  The bowel appears normal including the terminal ileum and appendix.  Calcification is present in the slightly enlarged prostate gland.  Mid abdominal aorta is dilated to an AP dimension of 3 cm, stable.  IMPRESSION:  1.  4 mm right ureteral stone causing mild right hydronephrosis, unchanged since 08/21/2011. 2.  3 cm abdominal aortic aneurysm, unchanged.  Original Report Authenticated By: Gwynn Burly, M.D.  1. Kidney stone       MDM  Patient's kidney stone with mild hydronephrosis on CT scan most correlated the patient's current symptoms with no other neurological deficits or reflex for back pain. He is afebrile with a nonacute abdomen. His angulated without difficulty.        [place], [place] 08/24/11 [phone]

## 2011-10-03 ENCOUNTER — Other Ambulatory Visit: Payer: Self-pay | Admitting: Urology

## 2011-10-10 ENCOUNTER — Encounter (HOSPITAL_BASED_OUTPATIENT_CLINIC_OR_DEPARTMENT_OTHER): Payer: Self-pay | Admitting: *Deleted

## 2011-10-10 NOTE — Progress Notes (Signed)
NPO AFTER MN. ARRIVES AT 0730. NEEDS HG AND EKG. CURRENT CHEST CT IN EPIC AND CHART.  WILL DO SPIRIVA AM OF SURG W/ SIPS OF WATER AND MAY TAKE PAIN RX IF NEEDED. WILL ALSO BRING RESCUE INHALER.

## 2011-10-16 NOTE — H&P (Signed)
History of Present Illness  [name] returns for follow-up management of a right ureteral calculus.  He still has moderate right lower quadrant discomfort and takes pain medications daily. He is on Tamsulosin. He has not passed a stone yet.  KUB shows the location of the stone in the distal ureter has remained unchanged.  He has been followed for the past 6 weeks for that calculus.   Past Medical History Problems  1. History of  Anxiety (Symptom) 300.00 2. History of  Chronic Renal Failure 585.9 3. History of  Esophageal Reflux 530.81 4. History of  Glaucoma 365.9 5. History of  Glaucoma 365.9 6. History of  Hypercholesterolemia 272.0 7. History of  Nephrolithiasis V13.01 8. History of  Renal Failure 586  Surgical History Problems  1. History of  Cystoscopy With Ureteroscopy With Manipulation Of Calculus 2. History of  Cystoscopy With Ureteroscopy With Manipulation Of Calculus 3. History of  Hernia Repair 4. History of  Inguinal Hernia Repair Bilateral 5. History of  Lithotripsy 6. History of  Lithotripsy 7. History of  Umbilical Hernia Repair  Current Meds 1. Alphagan P 0.1 % Ophthalmic Solution; Therapy: 15Feb2013 to 2. Fluticasone Propionate 50 MCG/ACT Nasal Suspension; Therapy: 22Dec2007 to 3. LORazepam 0.5 MG Oral Tablet; Therapy: 01Apr2013 to 4. Multi-Vitamin Oral Tablet; Therapy: (Recorded:08Feb2008) to 5. Oxycodone-Acetaminophen 5-325 MG Oral Tablet; TAKE 1 TO 2 TABLETS EVERY 4 HOURS AS  NEEDED FOR PAIN; Therapy: 11Jun2013 to (Last Rx:11Jun2013) 6. Pseudoephedrine HCl CR 120 MG CPCR; Therapy: (Recorded:08Feb2008) to 7. Spiriva HandiHaler 18 MCG Inhalation Capsule; Therapy: (Recorded:08Feb2008) to 8. Tamsulosin HCl 0.4 MG Oral Capsule; TAKE 1 CAPSULE Bedtime; Therapy: 23May2013 to (Last  Rx:11Jun2013)  Requested for: 11Jun2013 9. TraZODone HCl 50 MG Oral Tablet; Therapy: 14Sep2012 to 10. Valium 5 MG Oral Tablet; TAKE 1 TABLET PRN PRN; Therapy: (Recorded:08Feb2008) to 11.  Vitamin C TABS; Therapy: (Recorded:08Feb2008) to 12. Xalatan 0.005 % Ophthalmic Solution; Therapy: (Recorded:08Feb2008) to  Allergies Medication  1. Macrobid CAPS 2. Macrodantin CAPS  Family History Problems  1. Family history of  Family Health Status Number Of Children 1 son / 3 daugters 2. Family history of  Father Deceased At Age 26 black lung 3. Family history of  Heart Disease V17.49 4. Maternal history of  Hypertension V17.49 5. Family history of  Mother Deceased At Age 11 6. Daughter's history of  Urologic Disorder V18.7 kidney stones  Social History Problems  1. Caffeine Use 3 per day 2. Caffeine Use 3. Family history of  Death In The Family Father age 18-black lung 4. Family history of  Death In The Family Mother age 13-natural 5. Marital History - Currently Married 6. Tobacco Use V15.82 1 pack per day for 45 years 7. Tobacco Use 305.1 Denied  8. History of  Alcohol Use  Review of Systems Genitourinary, constitutional, skin, eye, otolaryngeal, hematologic/lymphatic, cardiovascular, pulmonary, endocrine, musculoskeletal, gastrointestinal, neurological and psychiatric system(s) were reviewed and pertinent findings if present are noted.    Physical Exam Constitutional: Well nourished and well developed . No acute distress.  ENT:. The ears and nose are normal in appearance.  Neck: The appearance of the neck is normal and no neck mass is present.  Pulmonary: No respiratory distress and normal respiratory rhythm and effort.  Cardiovascular: Heart rate and rhythm are normal . No peripheral edema.  Abdomen: The abdomen is soft and nontender. No masses are palpated. No CVA tenderness. No hernias are palpable. No hepatosplenomegaly noted.  Rectal: Rectal exam demonstrates no tenderness and no  masses. The left seminal vesicle is nonpalpable. The right seminal vesicle is nonpalpable. The perineum is normal on inspection.  Genitourinary: Examination of the penis  demonstrates no discharge, no masses, no lesions and a normal meatus. The scrotum is without lesions. The right epididymis is palpably normal and non-tender. The left epididymis is palpably normal and non-tender. The right testis is non-tender and without masses. The left testis is non-tender and without masses.  Lymphatics: The femoral and inguinal nodes are not enlarged or tender.  Skin: Normal skin turgor, no visible rash and no visible skin lesions.  Neuro/Psych:. Mood and affect are appropriate.    Results/Data Urine [Data Includes: Last 1 Day]   02Jul2013  COLOR YELLOW   APPEARANCE CLEAR   SPECIFIC GRAVITY 1.025   pH 5.5   GLUCOSE NEG mg/dL  BILIRUBIN NEG   KETONE NEG mg/dL  BLOOD TRACE   PROTEIN NEG mg/dL  UROBILINOGEN 0.2 mg/dL  NITRITE NEG   LEUKOCYTE ESTERASE NEG   SQUAMOUS EPITHELIAL/HPF RARE   WBC 3-6 WBC/hpf  RBC 3-6 RBC/hpf  BACTERIA NONE SEEN   CRYSTALS NONE SEEN   CASTS NONE SEEN   Other MUCUS     KUB INDICATION: Right distal ureteral calculus KUB shows normal bowel gas pattern.  Psoas shadows are normal.  The 4 mm calcification in the distal right ureter has remained the same. IMPRESSION: Right distal ureteral calculus.   Assessment Assessed  1. Distal Ureteral Stone On The Right 592.1  Plan Distal Ureteral Stone On The Right (592.1)  1. KUB  Done: 02Jul2013 12:00AM Health Maintenance (V70.0)  2. UA With REFLEX  Done: 02Jul2013 01:12PM   Since patient remains symptomatic and has not passed a stone he needs stone manipulation.  The procedure, risks, benefits were discussed with the patient.  The risks include but are not limited to hemorrhage, infection, ureteral injury.  He understands and is agreeable.   Signatures Electronically signed by : Su Grand, M.D.; Oct 03 2011  2:23PM

## 2011-10-17 ENCOUNTER — Encounter (HOSPITAL_BASED_OUTPATIENT_CLINIC_OR_DEPARTMENT_OTHER): Payer: Self-pay | Admitting: Anesthesiology

## 2011-10-17 ENCOUNTER — Ambulatory Visit (HOSPITAL_BASED_OUTPATIENT_CLINIC_OR_DEPARTMENT_OTHER)
Admission: RE | Admit: 2011-10-17 | Discharge: 2011-10-17 | Disposition: A | Discharge status: Home / Self Care | Payer: Medicare Other | Source: Ambulatory Visit | Attending: Urology | Admitting: Urology

## 2011-10-17 ENCOUNTER — Encounter (HOSPITAL_BASED_OUTPATIENT_CLINIC_OR_DEPARTMENT_OTHER): Payer: Self-pay | Admitting: *Deleted

## 2011-10-17 ENCOUNTER — Encounter (HOSPITAL_BASED_OUTPATIENT_CLINIC_OR_DEPARTMENT_OTHER)
Admission: RE | Disposition: A | Discharge status: Home / Self Care | Payer: Self-pay | Source: Ambulatory Visit | Attending: Urology

## 2011-10-17 ENCOUNTER — Ambulatory Visit (HOSPITAL_BASED_OUTPATIENT_CLINIC_OR_DEPARTMENT_OTHER): Payer: Medicare Other | Admitting: Anesthesiology

## 2011-10-17 DIAGNOSIS — K219 Gastro-esophageal reflux disease without esophagitis: Secondary | ICD-10-CM | POA: Insufficient documentation

## 2011-10-17 DIAGNOSIS — N201 Calculus of ureter: Secondary | ICD-10-CM | POA: Insufficient documentation

## 2011-10-17 DIAGNOSIS — Z79899 Other long term (current) drug therapy: Secondary | ICD-10-CM | POA: Insufficient documentation

## 2011-10-17 DIAGNOSIS — E7800 Pure hypercholesterolemia, unspecified: Secondary | ICD-10-CM | POA: Insufficient documentation

## 2011-10-17 HISTORY — DX: Interstitial pulmonary disease, unspecified: J84.9

## 2011-10-17 HISTORY — DX: Personal history of urinary calculi: Z87.442

## 2011-10-17 HISTORY — DX: Unspecified glaucoma: H40.9

## 2011-10-17 HISTORY — DX: Solitary pulmonary nodule: R91.1

## 2011-10-17 HISTORY — DX: Calculus of ureter: N20.1

## 2011-10-17 HISTORY — PX: CYSTOSCOPY/RETROGRADE/URETEROSCOPY/STONE EXTRACTION WITH BASKET: SHX5317

## 2011-10-17 LAB — POCT HEMOGLOBIN-HEMACUE: Hemoglobin: 16.7 g/dL (ref 13.0–17.0)

## 2011-10-17 SURGERY — CYSTOSCOPY, WITH CALCULUS REMOVAL USING BASKET
Anesthesia: General | Site: Ureter | Laterality: Right | Wound class: Clean Contaminated

## 2011-10-17 MED ORDER — LACTATED RINGERS IV SOLN: INTRAVENOUS | Status: DC

## 2011-10-17 MED ORDER — FENTANYL CITRATE 0.05 MG/ML IJ SOLN
25.0000 ug | INTRAMUSCULAR | Status: DC | PRN
  Administered 2011-10-17: 25 ug via INTRAVENOUS

## 2011-10-17 MED ORDER — DEXAMETHASONE SODIUM PHOSPHATE 4 MG/ML IJ SOLN
INTRAMUSCULAR | Status: DC | PRN
  Administered 2011-10-17 (×2): 5 mg via INTRAVENOUS

## 2011-10-17 MED ORDER — LACTATED RINGERS IV SOLN
INTRAVENOUS | Status: DC
  Administered 2011-10-17: 08:00:00 via INTRAVENOUS

## 2011-10-17 MED ORDER — PROPOFOL 10 MG/ML IV EMUL
INTRAVENOUS | Status: DC | PRN
  Administered 2011-10-17: 50 mg via INTRAVENOUS
  Administered 2011-10-17: 150 mg via INTRAVENOUS

## 2011-10-17 MED ORDER — SODIUM CHLORIDE 0.9 % IR SOLN
Status: DC | PRN
  Administered 2011-10-17: 6000 mL

## 2011-10-17 MED ORDER — CEFAZOLIN SODIUM 1-5 GM-% IV SOLN
1.0000 g | INTRAVENOUS | Status: AC
  Administered 2011-10-17: 1 g via INTRAVENOUS

## 2011-10-17 MED ORDER — LIDOCAINE HCL (CARDIAC) 20 MG/ML IV SOLN
INTRAVENOUS | Status: DC | PRN
  Administered 2011-10-17: 50 mg via INTRAVENOUS

## 2011-10-17 MED ORDER — MIDAZOLAM HCL 5 MG/5ML IJ SOLN
INTRAMUSCULAR | Status: DC | PRN
  Administered 2011-10-17: 0.5 mg via INTRAVENOUS

## 2011-10-17 MED ORDER — ONDANSETRON HCL 4 MG/2ML IJ SOLN
INTRAMUSCULAR | Status: DC | PRN
  Administered 2011-10-17: 4 mg via INTRAVENOUS

## 2011-10-17 MED ORDER — IOHEXOL 350 MG/ML SOLN
INTRAVENOUS | Status: DC | PRN
  Administered 2011-10-17: 8 mL via INTRAVENOUS

## 2011-10-17 MED ORDER — FENTANYL CITRATE 0.05 MG/ML IJ SOLN
INTRAMUSCULAR | Status: DC | PRN
  Administered 2011-10-17 (×2): 50 ug via INTRAVENOUS

## 2011-10-17 SURGICAL SUPPLY — 34 items
ADAPTER CATH URET PLST 4-6FR (CATHETERS) ×2 IMPLANT
ADPR CATH URET STRL DISP 4-6FR (CATHETERS) ×2
BAG DRAIN URO-CYSTO SKYTR STRL (DRAIN) ×3 IMPLANT
BAG DRN UROCATH (DRAIN) ×2
BASKET LASER NITINOL 1.9FR (BASKET) IMPLANT
BASKET STNLS GEMINI 4WIRE 3FR (BASKET) IMPLANT
BASKET ZERO TIP NITINOL 2.4FR (BASKET) ×2 IMPLANT
BRUSH URET BIOPSY 3F (UROLOGICAL SUPPLIES) IMPLANT
BSKT STON RTRVL 120 1.9FR (BASKET)
BSKT STON RTRVL GEM 120X11 3FR (BASKET)
BSKT STON RTRVL ZERO TP 2.4FR (BASKET) ×2
CANISTER SUCT LVC 12 LTR MEDI- (MISCELLANEOUS) ×2 IMPLANT
CATH INTERMIT  6FR 70CM (CATHETERS) IMPLANT
CATH URET 5FR 28IN CONE TIP (BALLOONS) ×1
CATH URET 5FR 28IN OPEN ENDED (CATHETERS) IMPLANT
CATH URET 5FR 70CM CONE TIP (BALLOONS) ×1 IMPLANT
CLOTH BEACON ORANGE TIMEOUT ST (SAFETY) ×3 IMPLANT
DRAPE CAMERA CLOSED 9X96 (DRAPES) ×2 IMPLANT
GLOVE BIO SURGEON STRL SZ7 (GLOVE) ×3 IMPLANT
GLOVE INDICATOR 6.5 STRL GRN (GLOVE) ×2 IMPLANT
GOWN SURGICAL LARGE (GOWNS) ×4 IMPLANT
GUIDEWIRE 0.038 PTFE COATED (WIRE) ×1 IMPLANT
GUIDEWIRE ANG ZIPWIRE 038X150 (WIRE) IMPLANT
GUIDEWIRE STR DUAL SENSOR (WIRE) ×2 IMPLANT
KIT BALLIN UROMAX 15FX10 (LABEL) IMPLANT
KIT BALLN UROMAX 15FX4 (MISCELLANEOUS) IMPLANT
KIT BALLN UROMAX 26 75X4 (MISCELLANEOUS)
LASER FIBER DISP (UROLOGICAL SUPPLIES) IMPLANT
LASER FIBER DISP 1000U (UROLOGICAL SUPPLIES) IMPLANT
NS IRRIG 500ML POUR BTL (IV SOLUTION) IMPLANT
PACK CYSTOSCOPY (CUSTOM PROCEDURE TRAY) ×3 IMPLANT
SET HIGH PRES BAL DIL (LABEL)
SHEATH URET ACCESS 12FR/35CM (UROLOGICAL SUPPLIES) IMPLANT
SHEATH URET ACCESS 12FR/55CM (UROLOGICAL SUPPLIES) IMPLANT

## 2011-10-17 NOTE — Anesthesia Procedure Notes (Signed)
Procedure Name: LMA Insertion Date/Time: 10/17/2011 9:12 AM Performed by: Norva Pavlov Pre-anesthesia Checklist: Patient identified, Emergency Drugs available, Suction available and Patient being monitored Patient Re-evaluated:Patient Re-evaluated prior to inductionOxygen Delivery Method: Circle System Utilized Preoxygenation: Pre-oxygenation with 100% oxygen Intubation Type: IV induction Ventilation: Mask ventilation without difficulty LMA: LMA inserted LMA Size: 4.0 Number of attempts: 1 Airway Equipment and Method: bite block Placement Confirmation: positive ETCO2 Tube secured with: Tape Dental Injury: Teeth and Oropharynx as per pre-operative assessment

## 2011-10-17 NOTE — Anesthesia Preprocedure Evaluation (Signed)
Anesthesia Evaluation  Patient identified by MRN, date of birth, ID band Patient awake    Reviewed: Allergy & Precautions, H&P , NPO status , Patient's Chart, lab work & pertinent test results  Airway Mallampati: II TM Distance: <3 FB Neck ROM: full    Dental No notable dental hx. (+) Missing and Dental Advisory Given,    Pulmonary neg pulmonary ROS, COPD COPD inhaler,  Mod. COPD breath sounds clear to auscultation  Pulmonary exam normal       Cardiovascular Exercise Tolerance: Good negative cardio ROS  Rhythm:regular Rate:Normal  PVDz. Aortic sclerosis   Neuro/Psych Anxiety glaucoma negative neurological ROS  negative psych ROS   GI/Hepatic negative GI ROS, Neg liver ROS,   Endo/Other  negative endocrine ROS  Renal/GU negative Renal ROS  negative genitourinary   Musculoskeletal   Abdominal   Peds  Hematology negative hematology ROS (+)   Anesthesia Other Findings   Reproductive/Obstetrics negative OB ROS                           Anesthesia Physical Anesthesia Plan  ASA: III  Anesthesia Plan: General   Post-op Pain Management:    Induction: Intravenous  Airway Management Planned: LMA  Additional Equipment:   Intra-op Plan:   Post-operative Plan:   Informed Consent: I have reviewed the patients History and Physical, chart, labs and discussed the procedure including the risks, benefits and alternatives for the proposed anesthesia with the patient or authorized representative who has indicated his/her understanding and acceptance.   Dental Advisory Given  Plan Discussed with: Surgeon  Anesthesia Plan Comments:         Anesthesia Quick Evaluation

## 2011-10-17 NOTE — Op Note (Signed)
Connor Cuevas is a 71 y.o.   10/17/2011  Pre-op diagnosis: Right distal ureteral calculus  Postop diagnosis: Same  Procedure done: Cystoscopy, right retrograde pyelogram, stone extraction  Surgeon: Wendie Simmer. Vincent Ehrler  Anesthesia: General  Indication: Patient is a 71 years old male who was seen in the emergency room about 6 weeks ago for right-sided abdominal pain. CT scan showed a 4 mm proximal ureteral calculus. He was treated with medical expulsive therapy. He has not passed the stone. KUB showed the stone in the right distal ureter. He had severe pain yesterday. He is scheduled now for cystoscopy and stone manipulation.  Procedure: The patient was identified by his wrist band and proper timeout was taken.  Under general anesthesia he was prepped and draped and placed in the dorsolithotomy position. A panendoscope was inserted in the bladder. The anterior urethra is normal. There is a stone at the bladder neck that was pushed him back in the bladder. The bladder is moderately trabeculated. There is no tumor in the bladder. A Nitinol basket was then passed through the cystoscope and the stone was grasped within the wires of the basket and extracted.  Retrograde pyelogram:  A cone-tip catheter was passed through the cystoscope and the right ureteral orifice. Contrast was then injected through the cone-tip catheter. The ureter appears normal. There is no filling defect in the ureter. The renal pelvis and calyces appear normal. There may be 2 small stones in a lower pole calyx.  Cone-tip catheter was then removed. The bladder was emptied and the cystoscope removed.  Patient tolerated the procedure well and left the OR in satisfactory condition to postanesthesia care unit.

## 2011-10-17 NOTE — Transfer of Care (Signed)
Immediate Anesthesia Transfer of Care Note  Patient: Connor Cuevas  Procedure(s) Performed: Procedure(s) (LRB): CYSTOSCOPY/RETROGRADE/URETEROSCOPY/STONE EXTRACTION WITH BASKET (Right)  Patient Location: PACU  Anesthesia Type: General  Level of Consciousness: awake, alert  and oriented  Airway & Oxygen Therapy: Patient Spontanous Breathing and Patient connected to face mask oxygen  Post-op Assessment: Report given to PACU RN and Post -op Vital signs reviewed and stable  Post vital signs: Reviewed and stable  Complications: No apparent anesthesia complications

## 2011-10-17 NOTE — Anesthesia Postprocedure Evaluation (Signed)
  Anesthesia Post-op Note  Patient: Connor Cuevas  Procedure(s) Performed: Procedure(s) (LRB): CYSTOSCOPY/RETROGRADE/URETEROSCOPY/STONE EXTRACTION WITH BASKET (Right)  Patient Location: PACU  Anesthesia Type: General  Level of Consciousness: awake and alert   Airway and Oxygen Therapy: Patient Spontanous Breathing  Post-op Pain: mild  Post-op Assessment: Post-op Vital signs reviewed, Patient's Cardiovascular Status Stable, Respiratory Function Stable, Patent Airway and No signs of Nausea or vomiting  Post-op Vital Signs: stable  Complications: No apparent anesthesia complications

## 2011-10-23 ENCOUNTER — Encounter (HOSPITAL_BASED_OUTPATIENT_CLINIC_OR_DEPARTMENT_OTHER): Payer: Self-pay

## 2011-10-23 ENCOUNTER — Encounter (HOSPITAL_BASED_OUTPATIENT_CLINIC_OR_DEPARTMENT_OTHER): Payer: Self-pay | Admitting: Urology

## 2012-08-06 ENCOUNTER — Other Ambulatory Visit: Payer: Self-pay | Admitting: Family Medicine

## 2012-08-06 DIAGNOSIS — I714 Abdominal aortic aneurysm, without rupture: Secondary | ICD-10-CM

## 2012-08-12 ENCOUNTER — Other Ambulatory Visit: Payer: Self-pay | Admitting: Family Medicine

## 2012-08-12 ENCOUNTER — Other Ambulatory Visit: Payer: Medicare Other

## 2012-08-12 DIAGNOSIS — I714 Abdominal aortic aneurysm, without rupture, unspecified: Secondary | ICD-10-CM

## 2012-08-13 ENCOUNTER — Ambulatory Visit
Admission: RE | Admit: 2012-08-13 | Discharge: 2012-08-13 | Disposition: A | Discharge status: Home / Self Care | Payer: Medicare Other | Source: Ambulatory Visit | Attending: Family Medicine | Admitting: Family Medicine

## 2012-08-13 DIAGNOSIS — I714 Abdominal aortic aneurysm, without rupture: Secondary | ICD-10-CM

## 2012-08-13 IMAGING — US US AORTA
1 series · 14 of 22 positions shown · non-contrast
Comparison: [DATE], CT abdomen

CLINICAL DATA: Follow-up abdominal aortic aneurysm

ULTRASOUND OF ABDOMINAL AORTA
TECHNIQUE: Ultrasound examination of the abdominal aorta was
performed to evaluate for abdominal aortic aneurysm.

[Series 1: us aorta · 0.30mm/px · 14 of 22 slices shown]
[im 1/22]
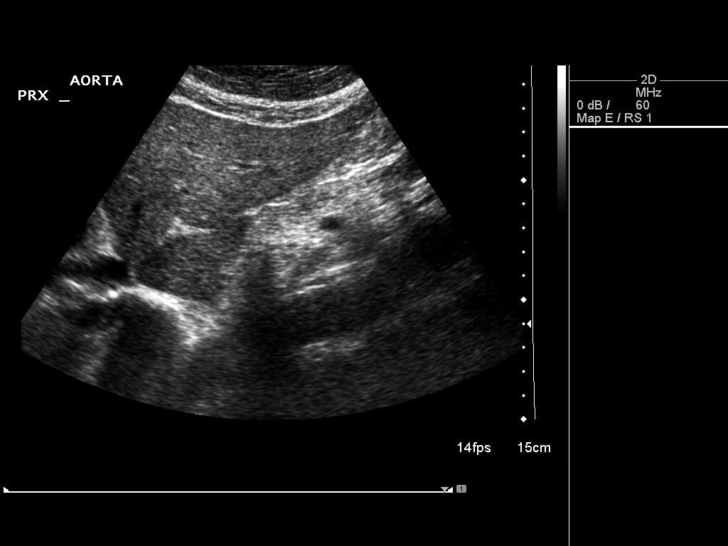
[im 3/22]
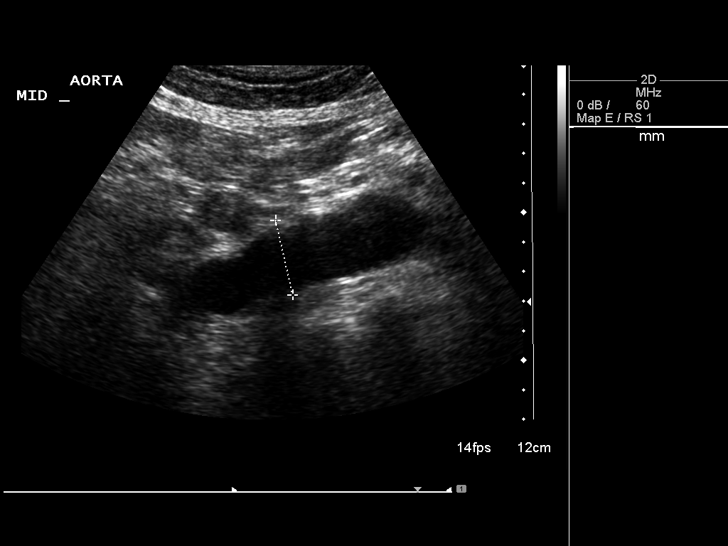
[im 4/22]
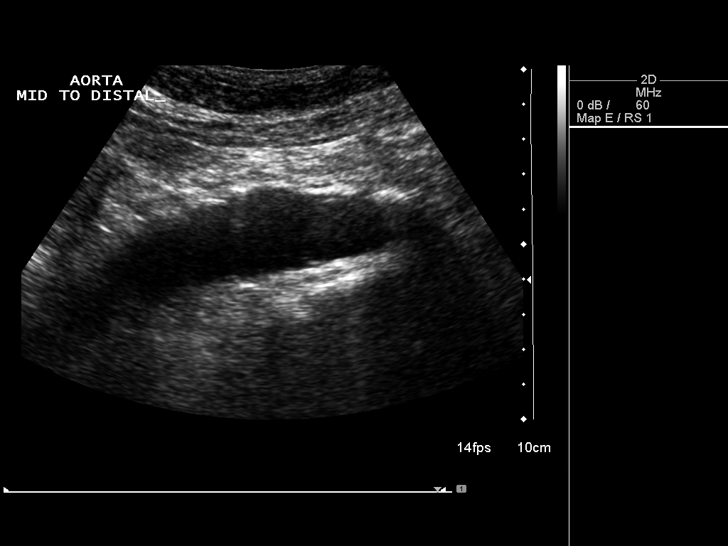
[im 6/22]
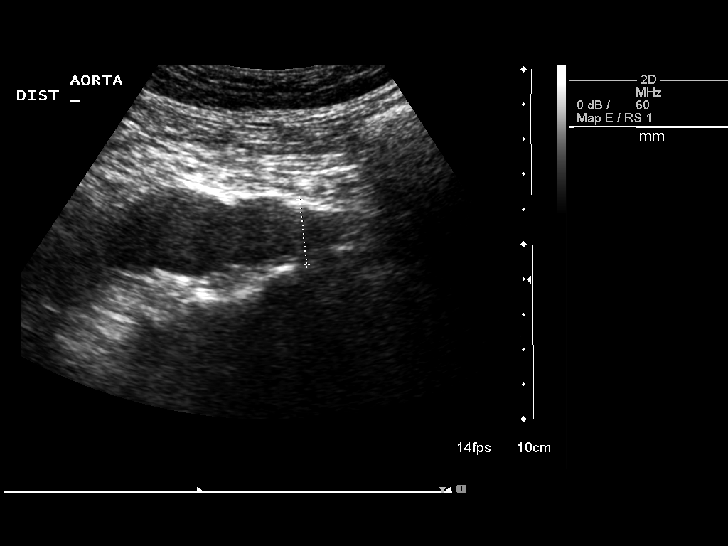
[im 8/22]
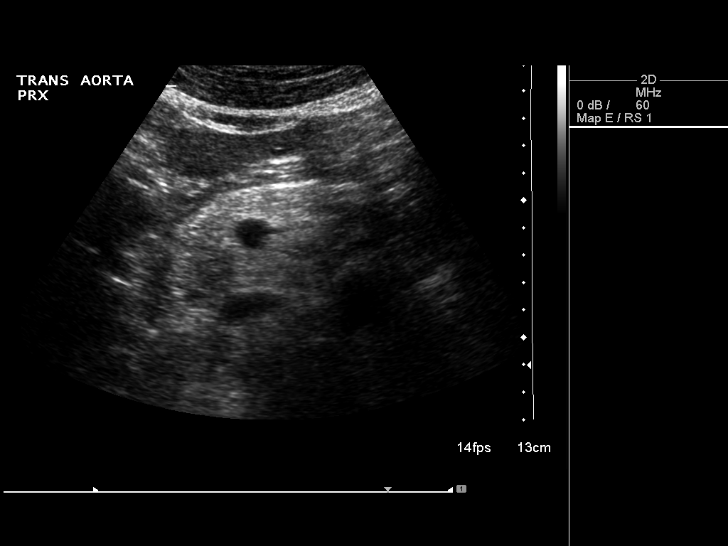
[im 9/22]
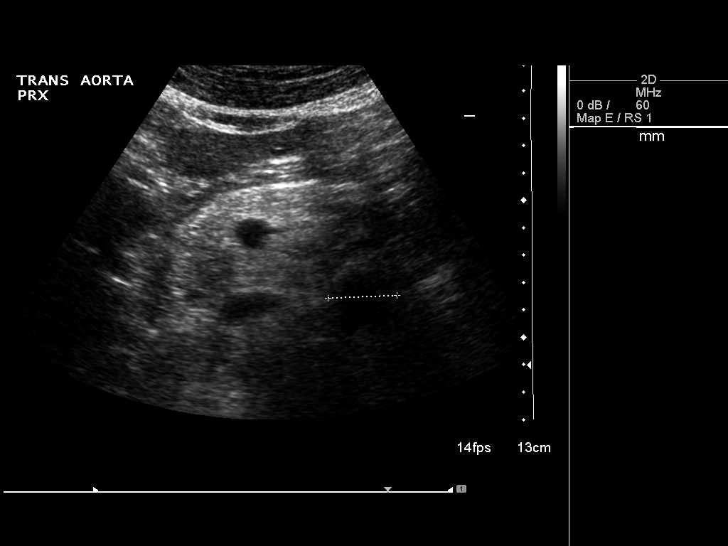
[im 11/22]
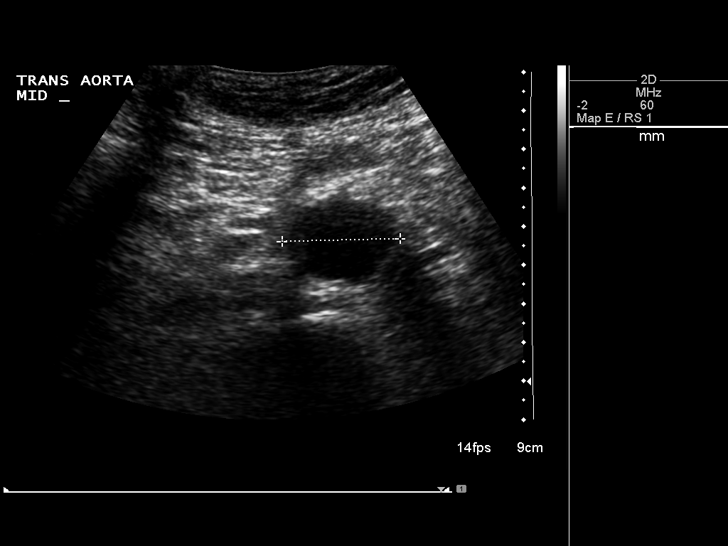
[im 12/22]
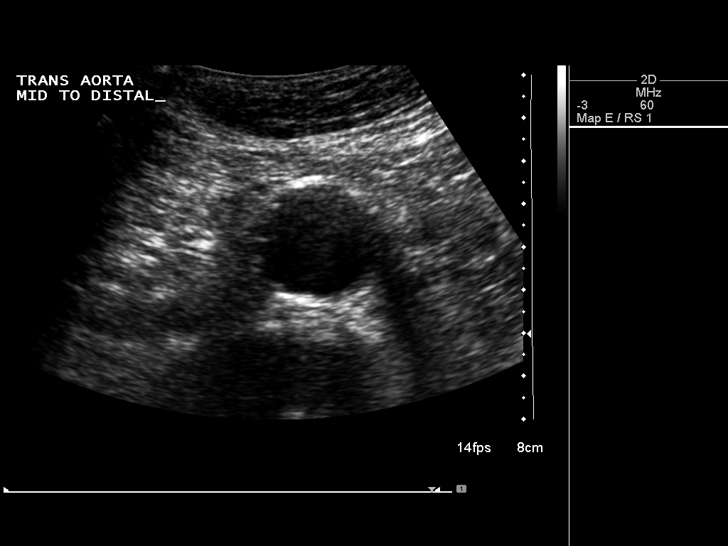
[im 14/22]
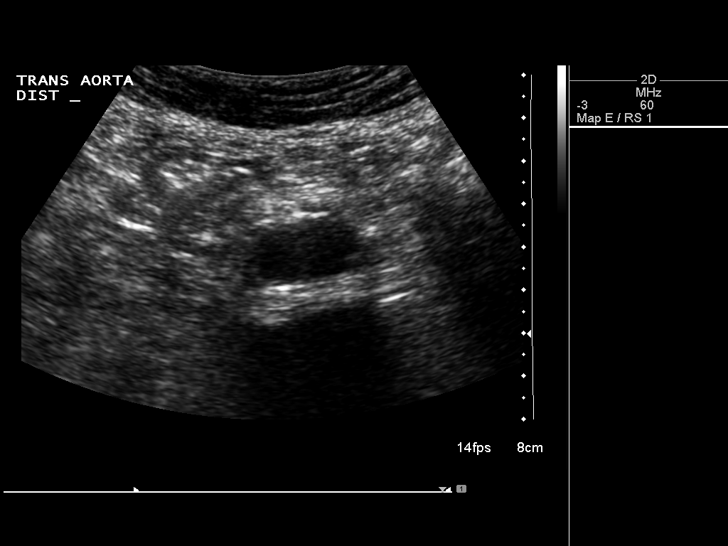
[im 15/22]
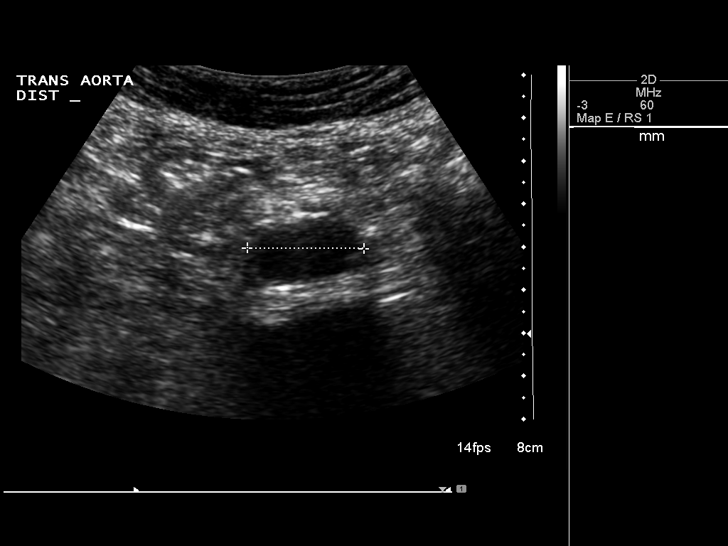
[im 17/22]
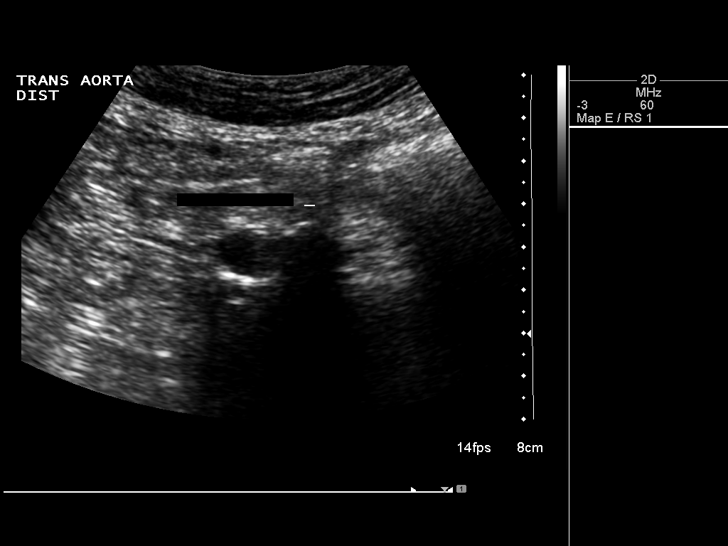
[im 19/22]
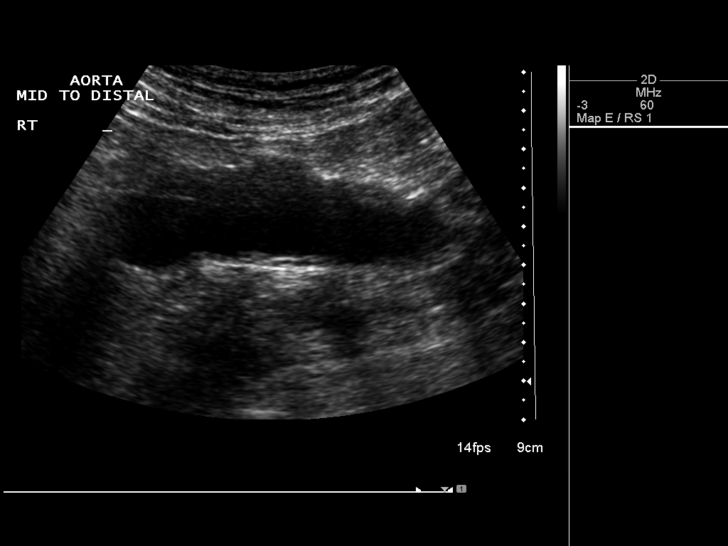
[im 20/22]
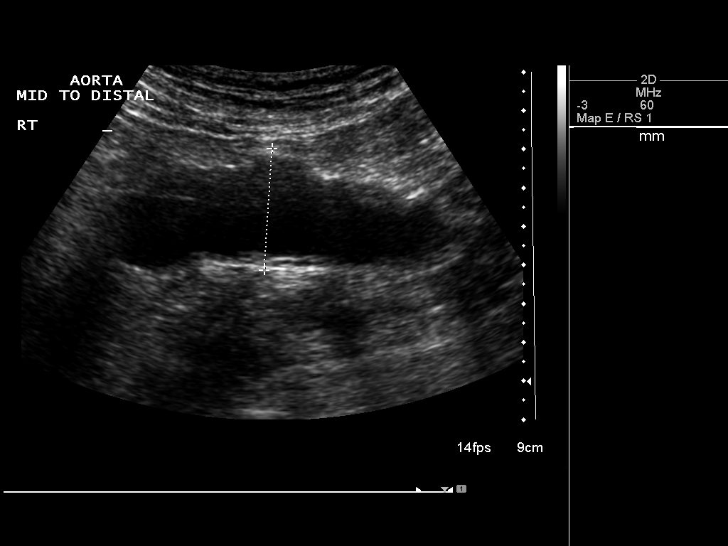
[im 22/22]
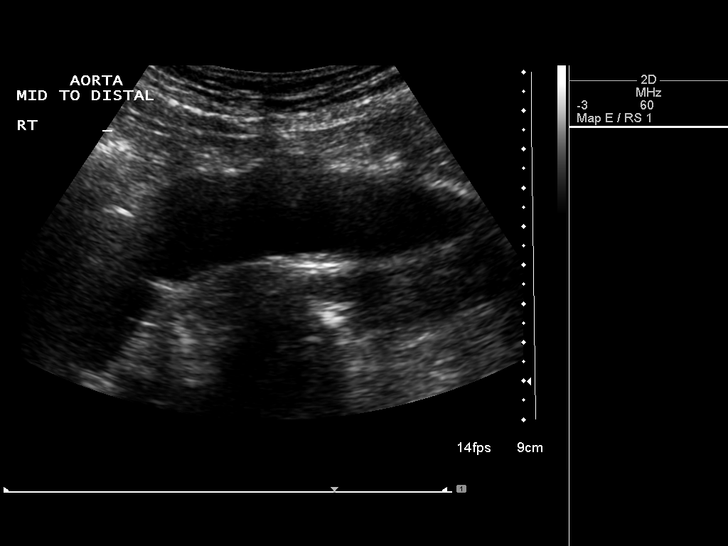

[14 of 22 positions shown; findings below may reference images not displayed]

Abdominal Aorta:  Mild aneurysmal dilatation of the aorta.Maximal
transverse diameter today is 3.3 cm.  Maximal AP diameter is
cm.  Previously, maximal AP diameter was 3.0 cm.  By my
measurements, maximal transverse diameter on the prior study was
3.3 cm.

      Maximum AP diameter:  3.2 cm.
      Maximum TRV diameter:  3.3 cm.
IMPRESSION: Mild aneurysmal dilatation of the aorta.  Previously, maximal
diameter in the transverse dimension was 3.3 cm.  This is stable
today. Recommend followup by US in 3 years.  This recommendation
follows ACR consensus guidelines: White Paper of the ACR Incidental

## 2014-01-08 ENCOUNTER — Emergency Department (HOSPITAL_COMMUNITY): Payer: Medicare Other

## 2014-01-08 ENCOUNTER — Inpatient Hospital Stay (HOSPITAL_COMMUNITY)
Admission: EM | Admit: 2014-01-08 | Discharge: 2014-01-10 | DRG: 247 | Disposition: A | Discharge status: Home / Self Care | Payer: Medicare Other | Attending: Cardiovascular Disease | Admitting: Cardiovascular Disease

## 2014-01-08 ENCOUNTER — Encounter (HOSPITAL_COMMUNITY): Payer: Self-pay | Admitting: Emergency Medicine

## 2014-01-08 DIAGNOSIS — F17210 Nicotine dependence, cigarettes, uncomplicated: Secondary | ICD-10-CM | POA: Diagnosis present

## 2014-01-08 DIAGNOSIS — Z8249 Family history of ischemic heart disease and other diseases of the circulatory system: Secondary | ICD-10-CM

## 2014-01-08 DIAGNOSIS — I2510 Atherosclerotic heart disease of native coronary artery without angina pectoris: Secondary | ICD-10-CM | POA: Diagnosis present

## 2014-01-08 DIAGNOSIS — I771 Stricture of artery: Secondary | ICD-10-CM | POA: Diagnosis present

## 2014-01-08 DIAGNOSIS — Z7982 Long term (current) use of aspirin: Secondary | ICD-10-CM

## 2014-01-08 DIAGNOSIS — R911 Solitary pulmonary nodule: Secondary | ICD-10-CM | POA: Diagnosis present

## 2014-01-08 DIAGNOSIS — I200 Unstable angina: Secondary | ICD-10-CM

## 2014-01-08 DIAGNOSIS — J449 Chronic obstructive pulmonary disease, unspecified: Secondary | ICD-10-CM | POA: Diagnosis present

## 2014-01-08 DIAGNOSIS — Z9861 Coronary angioplasty status: Secondary | ICD-10-CM

## 2014-01-08 DIAGNOSIS — E785 Hyperlipidemia, unspecified: Secondary | ICD-10-CM | POA: Diagnosis not present

## 2014-01-08 DIAGNOSIS — I25110 Atherosclerotic heart disease of native coronary artery with unstable angina pectoris: Principal | ICD-10-CM | POA: Diagnosis present

## 2014-01-08 DIAGNOSIS — I708 Atherosclerosis of other arteries: Secondary | ICD-10-CM | POA: Diagnosis present

## 2014-01-08 DIAGNOSIS — I714 Abdominal aortic aneurysm, without rupture, unspecified: Secondary | ICD-10-CM | POA: Diagnosis present

## 2014-01-08 DIAGNOSIS — H409 Unspecified glaucoma: Secondary | ICD-10-CM | POA: Diagnosis present

## 2014-01-08 DIAGNOSIS — F419 Anxiety disorder, unspecified: Secondary | ICD-10-CM | POA: Diagnosis present

## 2014-01-08 DIAGNOSIS — Z8709 Personal history of other diseases of the respiratory system: Secondary | ICD-10-CM

## 2014-01-08 DIAGNOSIS — R079 Chest pain, unspecified: Secondary | ICD-10-CM

## 2014-01-08 DIAGNOSIS — Z720 Tobacco use: Secondary | ICD-10-CM | POA: Diagnosis present

## 2014-01-08 HISTORY — DX: Atherosclerotic heart disease of native coronary artery without angina pectoris: I25.10

## 2014-01-08 HISTORY — DX: Abdominal aortic aneurysm, without rupture: I71.4

## 2014-01-08 HISTORY — DX: Unspecified osteoarthritis, unspecified site: M19.90

## 2014-01-08 HISTORY — DX: Tobacco use: Z72.0

## 2014-01-08 LAB — CBC WITH DIFFERENTIAL/PLATELET
Basophils Absolute: 0 10*3/uL (ref 0.0–0.1)
Basophils Relative: 0 % (ref 0–1)
Eosinophils Absolute: 0.3 10*3/uL (ref 0.0–0.7)
Eosinophils Relative: 3 % (ref 0–5)
HCT: 44.6 % (ref 39.0–52.0)
Hemoglobin: 15 g/dL (ref 13.0–17.0)
Lymphocytes Relative: 33 % (ref 12–46)
Lymphs Abs: 3 10*3/uL (ref 0.7–4.0)
MCH: 29.9 pg (ref 26.0–34.0)
MCHC: 33.6 g/dL (ref 30.0–36.0)
MCV: 89 fL (ref 78.0–100.0)
Monocytes Absolute: 0.7 10*3/uL (ref 0.1–1.0)
Monocytes Relative: 8 % (ref 3–12)
Neutro Abs: 5.1 10*3/uL (ref 1.7–7.7)
Neutrophils Relative %: 56 % (ref 43–77)
Platelets: 252 10*3/uL (ref 150–400)
RBC: 5.01 MIL/uL (ref 4.22–5.81)
RDW: 13.5 % (ref 11.5–15.5)
WBC: 9.1 10*3/uL (ref 4.0–10.5)

## 2014-01-08 LAB — PROTIME-INR
INR: 1.04 (ref 0.00–1.49)
Prothrombin Time: 13.7 seconds (ref 11.6–15.2)

## 2014-01-08 LAB — BASIC METABOLIC PANEL
Anion gap: 14 (ref 5–15)
BUN: 19 mg/dL (ref 6–23)
CO2: 23 mEq/L (ref 19–32)
Calcium: 9.1 mg/dL (ref 8.4–10.5)
Chloride: 104 mEq/L (ref 96–112)
Creatinine, Ser: 0.81 mg/dL (ref 0.50–1.35)
GFR calc Af Amer: >90 mL/min (ref >90)
GFR calc non Af Amer: 87 mL/min — ABNORMAL LOW (ref >90)
Glucose, Bld: 184 mg/dL — ABNORMAL HIGH (ref 70–99)
Potassium: 4 mEq/L (ref 3.7–5.3)
Sodium: 141 mEq/L (ref 137–147)

## 2014-01-08 LAB — TROPONIN I: Troponin I: <0.3 ng/mL (ref <0.30)

## 2014-01-08 LAB — APTT: APTT: 29 s (ref 24–37)

## 2014-01-08 LAB — I-STAT TROPONIN, ED: TROPONIN I, POC: 0.09 ng/mL — AB (ref 0.00–0.08)

## 2014-01-08 IMAGING — CR DG CHEST 1V PORT
1 series · 2 of 2 positions shown · non-contrast
Comparison: CT chest [DATE].

CLINICAL DATA: Inner min episodes of intense left-sided chest pain
extending to the jaw.

EXAM:
PORTABLE CHEST - 1 VIEW

[Series 1: AP · U · 2 of 2 slices shown]
[im 1/2]
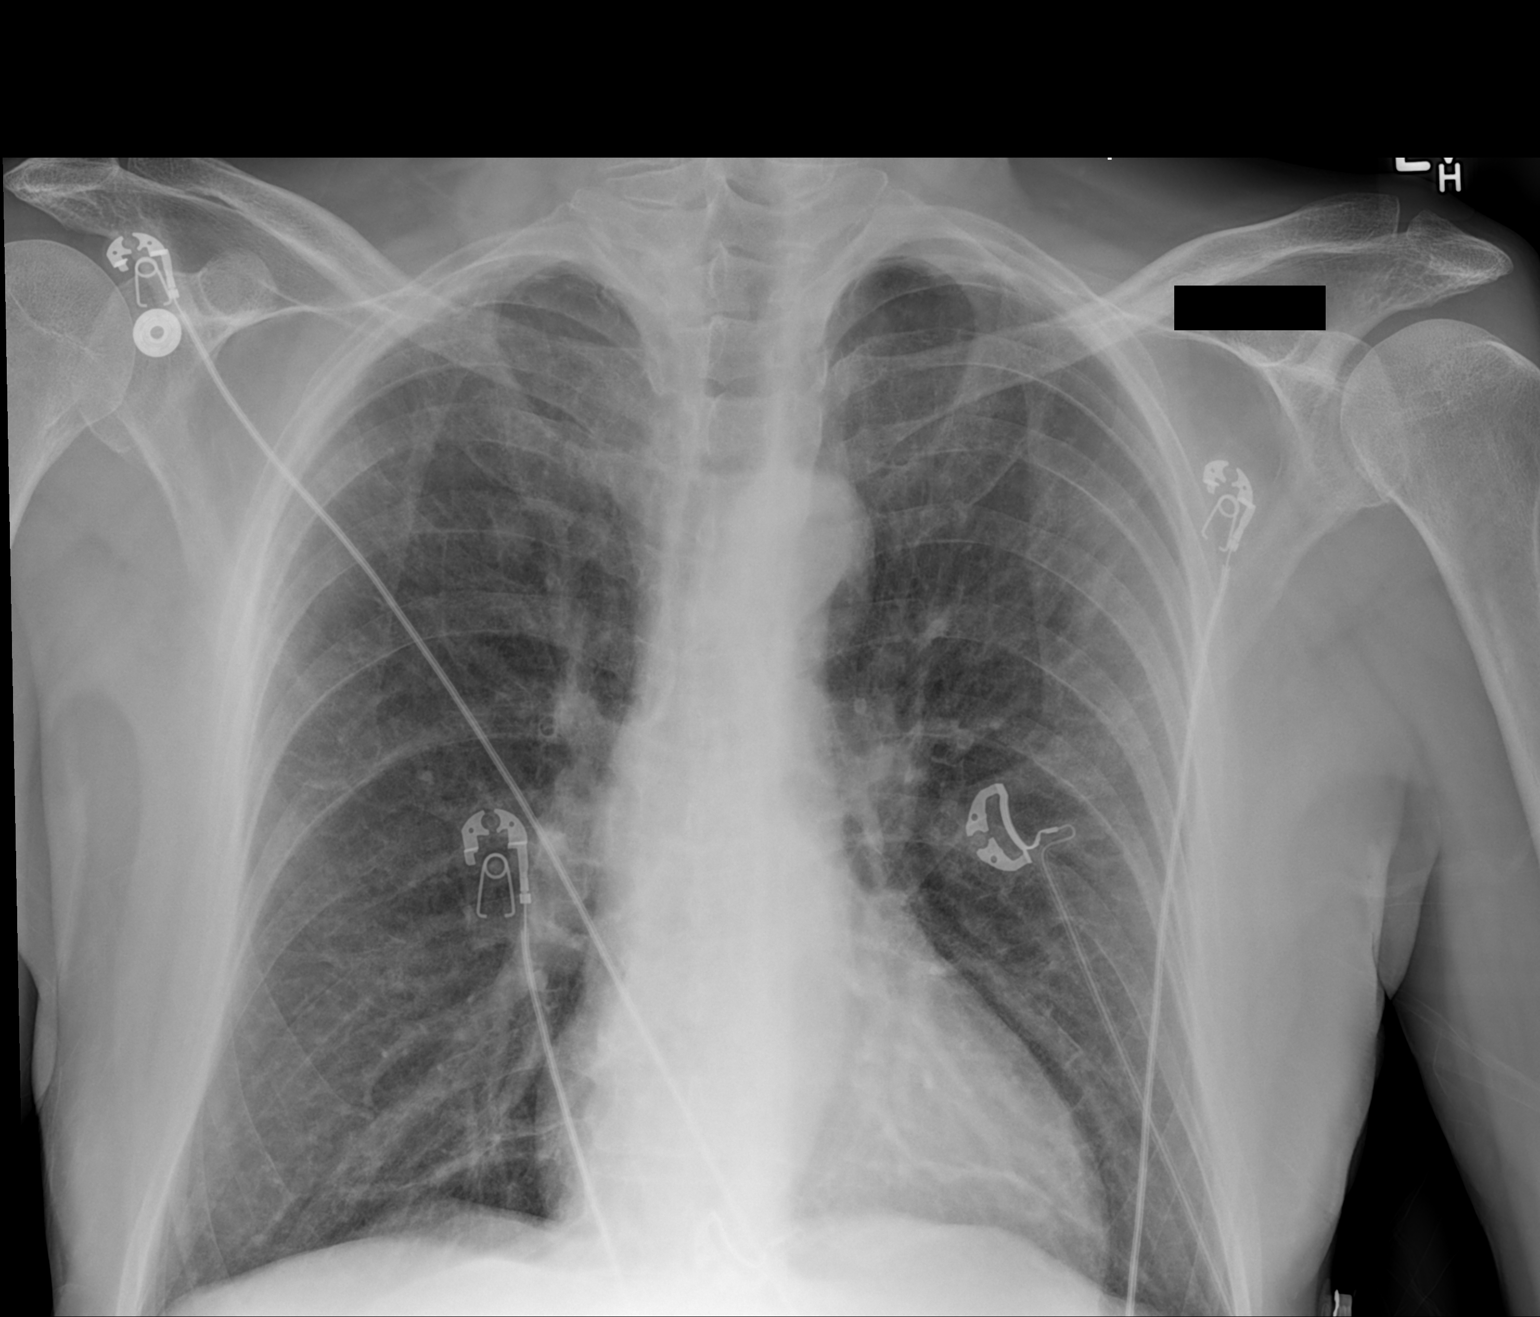
[im 2/2]
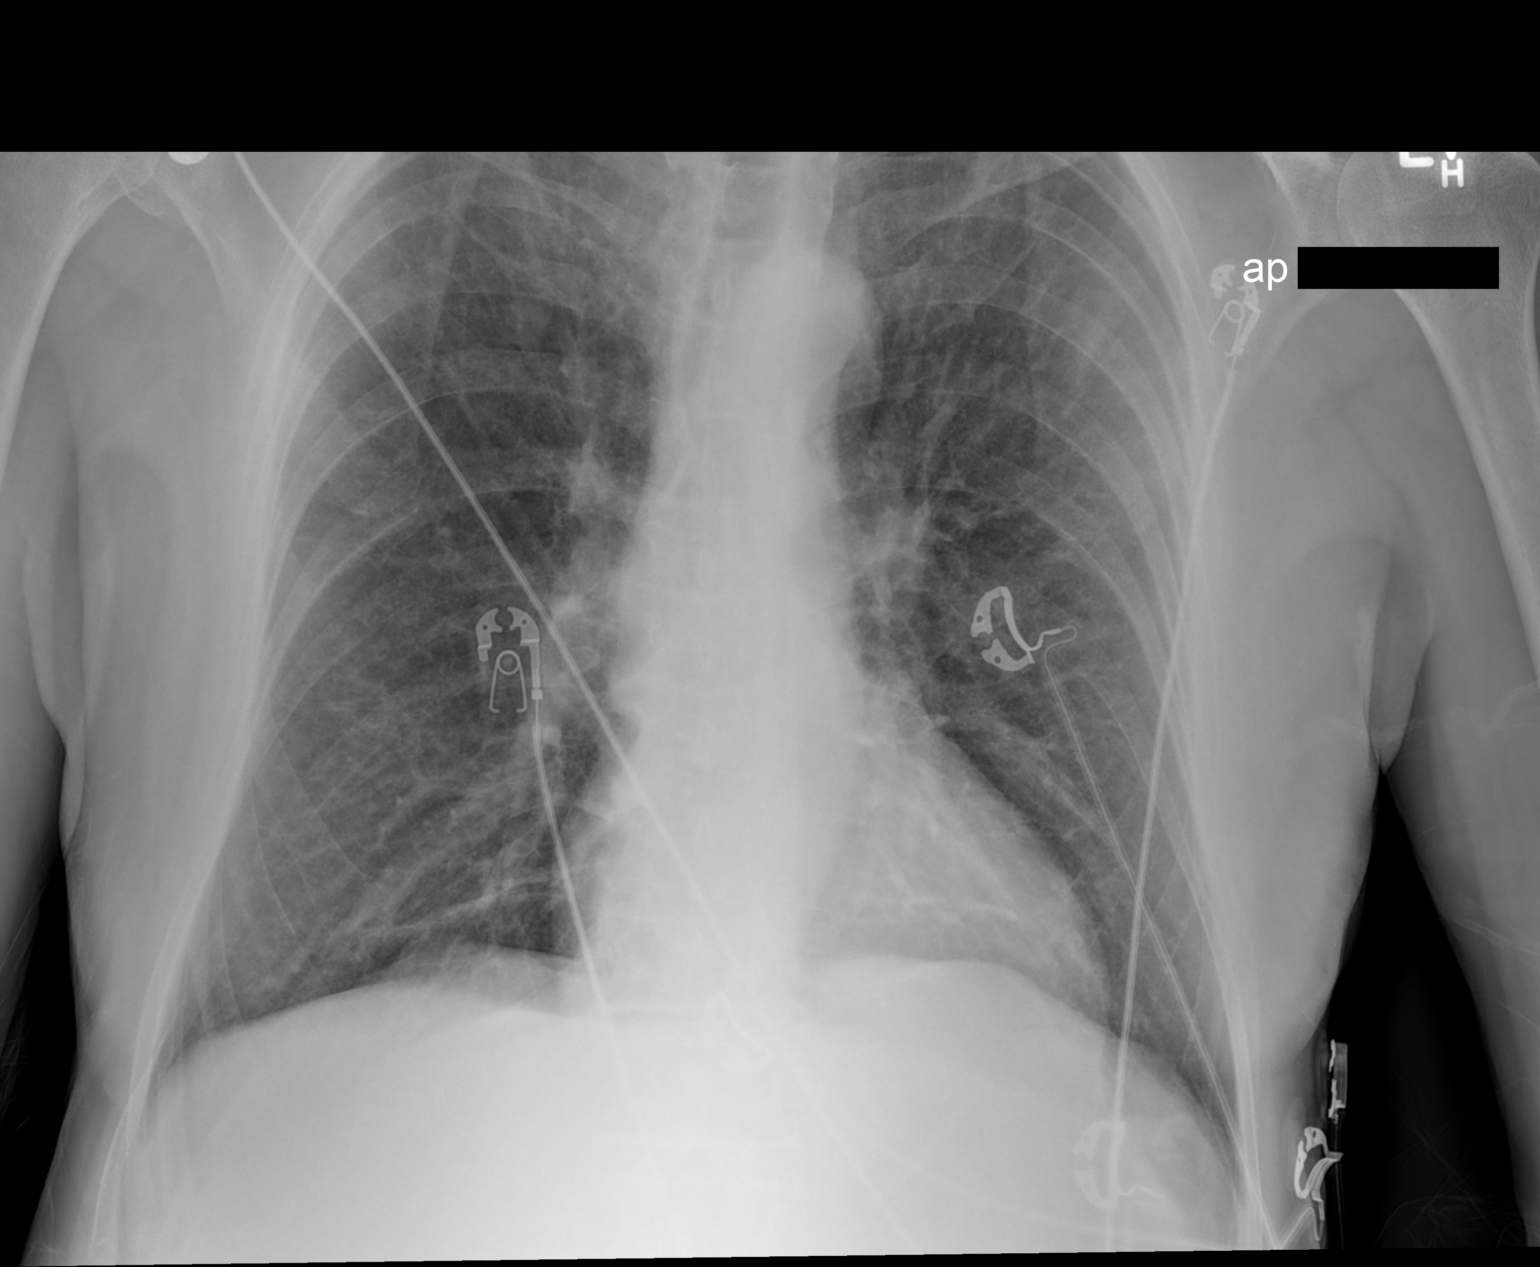

[2 of 2 positions shown; findings below may reference images not displayed]

FINDINGS: The heart size is normal. The right upper lobe pulmonary nodule is
again noted. Mild interstitial coarsening is chronic. There is no
significant superimposed disease. The visualized soft tissues and
bony thorax are unremarkable.
IMPRESSION: 1. No acute cardiopulmonary disease or significant interval change.
2. Chronic interstitial coarsening.
3. Right upper lobe pulmonary nodule.

## 2014-01-08 MED ORDER — ASPIRIN 81 MG PO CHEW
324.0000 mg | CHEWABLE_TABLET | Freq: Once | ORAL | Status: AC
  Administered 2014-01-08: 324 mg via ORAL
  Filled 2014-01-08: qty 4

## 2014-01-08 MED ORDER — SODIUM CHLORIDE 0.9 % IV SOLN
1000.0000 mL | INTRAVENOUS | Status: DC
  Administered 2014-01-08: 1000 mL via INTRAVENOUS

## 2014-01-08 NOTE — ED Notes (Signed)
Dr Tomi Bamberger aware of elevated I stat Troponin.

## 2014-01-08 NOTE — ED Notes (Signed)
Pt states for the past 10 days he has been having chest pain once a day  Pt states the pain radiates up into his neck and into his face and the roof of his mouth  Pt states the pain lasts about 3 minutes and he becomes very short of breath with the pain  Pt states then the pain goes away and comes back the following day  Pt states he has seen his pcp about it and was sent here for further evaluation

## 2014-01-08 NOTE — ED Provider Notes (Addendum)
CSN: [id number]     Arrival date & time 01/08/14  2055 History   First MD Initiated Contact with Patient 01/08/14 2150     Chief Complaint  Patient presents with  . Chest Pain   Patient is a 73 y.o. male presenting with chest pain. The history is provided by the patient.  Chest Pain Pain location:  Substernal area Radiates to: radiates to the jaw. Pain severity:  Severe Onset quality:  Sudden Duration:  3 minutes Timing:  Intermittent Chronicity:  New (Started about 10 days ago) Relieved by:  Rest Associated symptoms: fatigue, shortness of breath and weakness   Associated symptoms: no cough, no diaphoresis, no fever, no nausea and not vomiting   He has had one at least one episode daily.   One time it occurred while cutting the grass but it has happened at night as well.    After the episodes occur his arm feels heavy and he feels very washed out.  He went to his doctor today who sent him to the ED.  Past Medical History  Diagnosis Date  . Anxiety   . Hyperlipidemia   . Stenosis of iliac artery     Aortosclerosis, bilateral - mild with calcification. Per Dr. Moreen Fowler at [facility]   . Interstitial lung disease   . Pulmonary nodule, right RIGHT UPPER LOBE-- STABLE PER CHEST CT 08-17-2011  . Right ureteral stone   . History of kidney stones   . 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 Frequency of urination   . Urgency of urination   . COPD (chronic obstructive pulmonary disease) with emphysema     followed by pcp  dr [name]  . Glaucoma of both eyes   . Arthritis    Past Surgical History  Procedure Laterality Date  . Repair right inguinal hernia w/ mesh  12-31-2000  . Left ureteroscopic stone extraction  11-30-2000    W/ PREVIOUS ESWL AND URETERAL STENT PLACEMENT  . Laparoscopic left inguinal (obturator) hernia and umbilical hernia repair  01-13-2011  . Ureterolithotomy  2009  . Cataract extraction w/ intraocular lens  implant, bilateral   1997 (APPROX)  . Cystoscopy/retrograde/ureteroscopy/stone extraction with basket  10/17/2011    Procedure: CYSTOSCOPY/RETROGRADE/URETEROSCOPY/STONE EXTRACTION WITH BASKET;  Surgeon: Hanley Ben, MD;  Location: [facility];  Service: Urology;  Laterality: Right;   Family History  Problem Relation Age of Onset  . Hypertension Mother   . Heart attack Other   . Stroke Other    History  Substance Use Topics  . Smoking status: Current Every Day Smoker -- 1.00 packs/day for 53 years    Types: Cigarettes  . Smokeless tobacco: Never Used  . Alcohol Use: No    Review of Systems  Constitutional: Positive for fatigue. Negative for fever and diaphoresis.  Respiratory: Positive for shortness of breath. Negative for cough.   Cardiovascular: Positive for chest pain.  Gastrointestinal: Negative for nausea and vomiting.  Neurological: Positive for weakness.  All other systems reviewed and are negative.     Allergies  Macrobid  Home Medications   Prior to Admission medications   Medication Sig Start Date End Date Taking? Authorizing Provider  albuterol (PROVENTIL HFA;VENTOLIN HFA) 108 (90 BASE) MCG/ACT inhaler Inhale 2 puffs into the lungs every 6 (six) hours as needed for wheezing or shortness of breath.   Yes Historical Provider, MD  aspirin 81 MG tablet Take 81 mg by mouth daily.    Yes  Historical Provider, MD  Brimonidine Tartrate (ALPHAGAN P OP) Apply 1 drop to eye 2 (two) times daily.   Yes Historical Provider, MD  dorzolamide-timolol (COSOPT) 22.3-6.8 MG/ML ophthalmic solution Place 1 drop into both eyes 2 (two) times daily.   Yes Historical Provider, MD  HYDROcodone-acetaminophen (NORCO) 5-325 MG per tablet Take 0.5 tablets by mouth every 6 (six) hours as needed for moderate pain. For pain   Yes Historical Provider, MD  LORazepam (ATIVAN) 0.5 MG tablet Take 0.5 mg by mouth daily as needed. For anxiety   Yes Historical Provider, MD  Omega-3 Fatty Acids (FISH OIL) 1000  MG CAPS Take by mouth daily.    Yes Historical Provider, MD  tiotropium (SPIRIVA) 18 MCG inhalation capsule Place 18 mcg into inhaler and inhale every morning.    Yes Historical Provider, MD  traZODone (DESYREL) 150 MG tablet Take 75-150 mg by mouth at bedtime. Starts with half a tab if that doesn't work he takes the other half   Yes Historical Provider, MD  rosuvastatin (CRESTOR) 20 MG tablet Take 20 mg by mouth every evening.     Historical Provider, MD   BP 148/75  Pulse 80  Temp(Src) 98.4 F (36.9 C) (Oral)  Resp 20  SpO2 98% Physical Exam  Nursing note and vitals reviewed. Constitutional: He appears well-developed and well-nourished. No distress.  HENT:  Head: Normocephalic and atraumatic.  Right Ear: External ear normal.  Left Ear: External ear normal.  Eyes: Conjunctivae are normal. Right eye exhibits no discharge. Left eye exhibits no discharge. No scleral icterus.  Neck: Neck supple. No tracheal deviation present.  Cardiovascular: Normal rate, regular rhythm and intact distal pulses.   Pulmonary/Chest: Effort normal and breath sounds normal. No stridor. No respiratory distress. He has no wheezes. He has no rales.  Abdominal: Soft. Bowel sounds are normal. He exhibits no distension. There is no tenderness. There is no rebound and no guarding.  Musculoskeletal: He exhibits no edema and no tenderness.  Neurological: He is alert. He has normal strength. No cranial nerve deficit (no facial droop, extraocular movements intact, no slurred speech) or sensory deficit. He exhibits normal muscle tone. He displays no seizure activity. Coordination normal.  Skin: Skin is warm and dry. No rash noted.  Psychiatric: He has a normal mood and affect.    ED Course  Procedures (including critical care time) Labs Review Labs Reviewed  BASIC METABOLIC PANEL - Abnormal; Notable for the following:    Glucose, Bld 184 (*)    GFR calc non Af Amer 87 (*)    All other components within normal limits   I-STAT TROPOININ, ED - Abnormal; Notable for the following:    Troponin i, poc 0.09 (*)    All other components within normal limits  CBC WITH DIFFERENTIAL  TROPONIN I  APTT  PROTIME-INR    Imaging Review Dg Chest Portable 1 View  01/08/2014   CLINICAL DATA:  Inner min episodes of intense left-sided chest pain extending to the jaw.  EXAM: PORTABLE CHEST - 1 VIEW  COMPARISON:  CT chest 08/17/2011.  FINDINGS: The heart size is normal. The right upper lobe pulmonary nodule is again noted. Mild interstitial coarsening is chronic. There is no significant superimposed disease. The visualized soft tissues and bony thorax are unremarkable.  IMPRESSION: 1. No acute cardiopulmonary disease or significant interval change. 2. Chronic interstitial coarsening. 3. Right upper lobe pulmonary nodule.   Electronically Signed   By: Lawrence Santiago M.D.   On: 01/08/2014 22:52  EKG Interpretation   Date/Time:  Thursday January 08 2014 21:01:08 EDT Ventricular Rate:  89 PR Interval:  168 QRS Duration: 92 QT Interval:  409 QTC Calculation: 498 R Axis:   72 Text Interpretation:  Sinus rhythm Right atrial enlargement Anterior  infarct, old Borderline ST depression, diffuse leads , new since prior  tracing Confirmed by Fritzie Prioleau  MD-J, Sequoia Witz ([id number]) on 01/08/2014 10:13:42 PM      MDM   Final diagnoses:  Chest pain, unspecified chest pain type    The patient's symptoms are concerning for unstable angina. Patient does not have a history of coronary artery disease her he does have several risk factors, continues to smoke cigarettes and also has a history of vascular disease.  The patient's troponins was slightly elevated at 0.09 however the other repeat test was normal.  I will consult with cardiology to see whether the patient should be admitted to the medical service versus directly admitted to the cardiology service. He is pain-free at this time. will continue to monitor closely.   Dorie Rank,  MD 01/08/14 2320  Discussed with Dr Radford Pax.  Recommends medical admission  Dorie Rank, MD 01/09/14 [phone]

## 2014-01-09 ENCOUNTER — Encounter (HOSPITAL_COMMUNITY): Payer: Self-pay | Admitting: Internal Medicine

## 2014-01-09 ENCOUNTER — Encounter (HOSPITAL_COMMUNITY)
Admission: EM | Disposition: A | Discharge status: Home / Self Care | Payer: Medicare Other | Source: Home / Self Care | Attending: Cardiovascular Disease

## 2014-01-09 DIAGNOSIS — Z8249 Family history of ischemic heart disease and other diseases of the circulatory system: Secondary | ICD-10-CM | POA: Diagnosis not present

## 2014-01-09 DIAGNOSIS — E785 Hyperlipidemia, unspecified: Secondary | ICD-10-CM

## 2014-01-09 DIAGNOSIS — H409 Unspecified glaucoma: Secondary | ICD-10-CM | POA: Diagnosis present

## 2014-01-09 DIAGNOSIS — R079 Chest pain, unspecified: Secondary | ICD-10-CM

## 2014-01-09 DIAGNOSIS — F17210 Nicotine dependence, cigarettes, uncomplicated: Secondary | ICD-10-CM | POA: Diagnosis present

## 2014-01-09 DIAGNOSIS — J449 Chronic obstructive pulmonary disease, unspecified: Secondary | ICD-10-CM | POA: Diagnosis present

## 2014-01-09 DIAGNOSIS — I714 Abdominal aortic aneurysm, without rupture: Secondary | ICD-10-CM | POA: Diagnosis present

## 2014-01-09 DIAGNOSIS — R072 Precordial pain: Secondary | ICD-10-CM

## 2014-01-09 DIAGNOSIS — R911 Solitary pulmonary nodule: Secondary | ICD-10-CM | POA: Diagnosis present

## 2014-01-09 DIAGNOSIS — I708 Atherosclerosis of other arteries: Secondary | ICD-10-CM | POA: Diagnosis present

## 2014-01-09 DIAGNOSIS — Z7982 Long term (current) use of aspirin: Secondary | ICD-10-CM | POA: Diagnosis not present

## 2014-01-09 DIAGNOSIS — F419 Anxiety disorder, unspecified: Secondary | ICD-10-CM | POA: Diagnosis present

## 2014-01-09 DIAGNOSIS — I25110 Atherosclerotic heart disease of native coronary artery with unstable angina pectoris: Secondary | ICD-10-CM | POA: Diagnosis present

## 2014-01-09 DIAGNOSIS — Z8709 Personal history of other diseases of the respiratory system: Secondary | ICD-10-CM

## 2014-01-09 HISTORY — PX: LEFT HEART CATHETERIZATION WITH CORONARY ANGIOGRAM: SHX5451

## 2014-01-09 LAB — CBC WITH DIFFERENTIAL/PLATELET
Basophils Absolute: 0 10*3/uL (ref 0.0–0.1)
Basophils Relative: 0 % (ref 0–1)
EOS ABS: 0.3 10*3/uL (ref 0.0–0.7)
Eosinophils Relative: 3 % (ref 0–5)
HCT: 44.7 % (ref 39.0–52.0)
HEMOGLOBIN: 14.6 g/dL (ref 13.0–17.0)
LYMPHS ABS: 3.6 10*3/uL (ref 0.7–4.0)
LYMPHS PCT: 35 % (ref 12–46)
MCH: 29.4 pg (ref 26.0–34.0)
MCHC: 32.7 g/dL (ref 30.0–36.0)
MCV: 89.9 fL (ref 78.0–100.0)
MONOS PCT: 7 % (ref 3–12)
Monocytes Absolute: 0.7 10*3/uL (ref 0.1–1.0)
NEUTROS PCT: 55 % (ref 43–77)
Neutro Abs: 5.7 10*3/uL (ref 1.7–7.7)
Platelets: 267 10*3/uL (ref 150–400)
RBC: 4.97 MIL/uL (ref 4.22–5.81)
RDW: 13.4 % (ref 11.5–15.5)
WBC: 10.3 10*3/uL (ref 4.0–10.5)

## 2014-01-09 LAB — COMPREHENSIVE METABOLIC PANEL
ALT: 10 U/L (ref 0–53)
AST: 15 U/L (ref 0–37)
Albumin: 3.3 g/dL — ABNORMAL LOW (ref 3.5–5.2)
Alkaline Phosphatase: 55 U/L (ref 39–117)
Anion gap: 15 (ref 5–15)
BUN: 20 mg/dL (ref 6–23)
CALCIUM: 8.9 mg/dL (ref 8.4–10.5)
CO2: 21 mEq/L (ref 19–32)
CREATININE: 0.82 mg/dL (ref 0.50–1.35)
Chloride: 105 mEq/L (ref 96–112)
GFR calc non Af Amer: 87 mL/min — ABNORMAL LOW (ref >90)
Glucose, Bld: 161 mg/dL — ABNORMAL HIGH (ref 70–99)
Potassium: 4 mEq/L (ref 3.7–5.3)
Sodium: 141 mEq/L (ref 137–147)
TOTAL PROTEIN: 6.3 g/dL (ref 6.0–8.3)
Total Bilirubin: 0.4 mg/dL (ref 0.3–1.2)

## 2014-01-09 LAB — POCT ACTIVATED CLOTTING TIME: Activated Clotting Time: 360 seconds

## 2014-01-09 LAB — I-STAT TROPONIN, ED: Troponin i, poc: 0.09 ng/mL (ref 0.00–0.08)

## 2014-01-09 LAB — GLUCOSE, CAPILLARY
Glucose-Capillary: 123 mg/dL — ABNORMAL HIGH (ref 70–99)
Glucose-Capillary: 125 mg/dL — ABNORMAL HIGH (ref 70–99)

## 2014-01-09 LAB — TROPONIN I

## 2014-01-09 LAB — HEPARIN LEVEL (UNFRACTIONATED): HEPARIN UNFRACTIONATED: 0.16 [IU]/mL — AB (ref 0.30–0.70)

## 2014-01-09 SURGERY — LEFT HEART CATHETERIZATION WITH CORONARY ANGIOGRAM
Anesthesia: LOCAL

## 2014-01-09 MED ORDER — SODIUM CHLORIDE 0.9 % IJ SOLN: 3.0000 mL | Freq: Two times a day (BID) | INTRAMUSCULAR | Status: DC

## 2014-01-09 MED ORDER — TRAZODONE HCL 150 MG PO TABS
75.0000 mg | ORAL_TABLET | Freq: Every day | ORAL | Status: DC
  Filled 2014-01-09 (×2): qty 1

## 2014-01-09 MED ORDER — ASPIRIN EC 325 MG PO TBEC
325.0000 mg | DELAYED_RELEASE_TABLET | Freq: Every day | ORAL | Status: DC
  Administered 2014-01-09: 325 mg via ORAL
  Filled 2014-01-09: qty 1

## 2014-01-09 MED ORDER — CLOPIDOGREL BISULFATE 75 MG PO TABS
75.0000 mg | ORAL_TABLET | Freq: Every day | ORAL | Status: DC
  Administered 2014-01-10: 09:00:00 75 mg via ORAL
  Filled 2014-01-09: qty 1

## 2014-01-09 MED ORDER — SODIUM CHLORIDE 0.9 % IV SOLN: 250.0000 mL | INTRAVENOUS | Status: DC | PRN

## 2014-01-09 MED ORDER — NITROGLYCERIN 0.4 MG SL SUBL: 0.4000 mg | SUBLINGUAL_TABLET | SUBLINGUAL | Status: DC | PRN

## 2014-01-09 MED ORDER — DORZOLAMIDE HCL-TIMOLOL MAL 2-0.5 % OP SOLN
1.0000 [drp] | Freq: Two times a day (BID) | OPHTHALMIC | Status: DC
  Administered 2014-01-09 – 2014-01-10 (×3): 1 [drp] via OPHTHALMIC
  Filled 2014-01-09 (×2): qty 10

## 2014-01-09 MED ORDER — HEPARIN (PORCINE) IN NACL 100-0.45 UNIT/ML-% IJ SOLN
850.0000 [IU]/h | INTRAMUSCULAR | Status: DC
  Administered 2014-01-09: 850 [IU]/h via INTRAVENOUS
  Filled 2014-01-09 (×2): qty 250

## 2014-01-09 MED ORDER — FENTANYL CITRATE 0.05 MG/ML IJ SOLN
INTRAMUSCULAR | Status: AC
  Filled 2014-01-09: qty 2

## 2014-01-09 MED ORDER — SODIUM CHLORIDE 0.9 % IJ SOLN: 3.0000 mL | INTRAMUSCULAR | Status: DC | PRN

## 2014-01-09 MED ORDER — LORAZEPAM 0.5 MG PO TABS: 0.5000 mg | ORAL_TABLET | Freq: Every day | ORAL | Status: DC | PRN

## 2014-01-09 MED ORDER — HEPARIN BOLUS VIA INFUSION
4000.0000 [IU] | Freq: Once | INTRAVENOUS | Status: AC
  Administered 2014-01-09: 4000 [IU] via INTRAVENOUS
  Filled 2014-01-09: qty 4000

## 2014-01-09 MED ORDER — TRAZODONE HCL 50 MG PO TABS
75.0000 mg | ORAL_TABLET | Freq: Every day | ORAL | Status: DC
  Filled 2014-01-09 (×2): qty 1

## 2014-01-09 MED ORDER — TRAZODONE 25 MG HALF TABLET
75.0000 mg | ORAL_TABLET | Freq: Every evening | ORAL | Status: DC | PRN
  Filled 2014-01-09: qty 1

## 2014-01-09 MED ORDER — INFLUENZA VAC SPLIT QUAD 0.5 ML IM SUSY
0.5000 mL | PREFILLED_SYRINGE | INTRAMUSCULAR | Status: AC
  Administered 2014-01-10: 0.5 mL via INTRAMUSCULAR
  Filled 2014-01-09: qty 0.5

## 2014-01-09 MED ORDER — BRIMONIDINE TARTRATE 0.15 % OP SOLN
1.0000 [drp] | Freq: Three times a day (TID) | OPHTHALMIC | Status: DC
  Administered 2014-01-09: 1 [drp] via OPHTHALMIC
  Filled 2014-01-09 (×2): qty 5

## 2014-01-09 MED ORDER — TIOTROPIUM BROMIDE MONOHYDRATE 18 MCG IN CAPS
18.0000 ug | ORAL_CAPSULE | Freq: Every morning | RESPIRATORY_TRACT | Status: DC
  Filled 2014-01-09 (×2): qty 5

## 2014-01-09 MED ORDER — LIDOCAINE HCL (PF) 1 % IJ SOLN
INTRAMUSCULAR | Status: AC
  Filled 2014-01-09: qty 30

## 2014-01-09 MED ORDER — HEPARIN (PORCINE) IN NACL 100-0.45 UNIT/ML-% IJ SOLN
1050.0000 [IU]/h | INTRAMUSCULAR | Status: DC
  Filled 2014-01-09: qty 250

## 2014-01-09 MED ORDER — VERAPAMIL HCL 2.5 MG/ML IV SOLN
INTRAVENOUS | Status: AC
  Filled 2014-01-09: qty 2

## 2014-01-09 MED ORDER — MIDAZOLAM HCL 2 MG/2ML IJ SOLN
INTRAMUSCULAR | Status: AC
  Filled 2014-01-09: qty 2

## 2014-01-09 MED ORDER — ONDANSETRON HCL 4 MG/2ML IJ SOLN: 4.0000 mg | Freq: Four times a day (QID) | INTRAMUSCULAR | Status: DC | PRN

## 2014-01-09 MED ORDER — SODIUM CHLORIDE 0.9 % IV SOLN
1.0000 mL/kg/h | INTRAVENOUS | Status: AC
  Administered 2014-01-09: 1 mL/kg/h via INTRAVENOUS

## 2014-01-09 MED ORDER — HEPARIN (PORCINE) IN NACL 2-0.9 UNIT/ML-% IJ SOLN
INTRAMUSCULAR | Status: AC
  Filled 2014-01-09: qty 1000

## 2014-01-09 MED ORDER — MORPHINE SULFATE 2 MG/ML IJ SOLN
1.0000 mg | INTRAMUSCULAR | Status: DC | PRN
  Administered 2014-01-09: 16:00:00 1 mg via INTRAVENOUS
  Filled 2014-01-09: qty 1

## 2014-01-09 MED ORDER — ASPIRIN 81 MG PO CHEW
81.0000 mg | CHEWABLE_TABLET | Freq: Every day | ORAL | Status: DC
  Administered 2014-01-10: 11:00:00 81 mg via ORAL
  Filled 2014-01-09: qty 1

## 2014-01-09 MED ORDER — BRIMONIDINE TARTRATE 0.2 % OP SOLN
1.0000 [drp] | Freq: Three times a day (TID) | OPHTHALMIC | Status: DC
  Administered 2014-01-09 – 2014-01-10 (×3): 1 [drp] via OPHTHALMIC
  Filled 2014-01-09: qty 5

## 2014-01-09 MED ORDER — FAMOTIDINE IN NACL 20-0.9 MG/50ML-% IV SOLN
INTRAVENOUS | Status: AC
  Filled 2014-01-09: qty 50

## 2014-01-09 MED ORDER — SODIUM CHLORIDE 0.9 % IV SOLN
250.0000 mL | INTRAVENOUS | Status: DC | PRN
Start: 2014-01-09 — End: 2014-01-09

## 2014-01-09 MED ORDER — ACETAMINOPHEN 325 MG PO TABS
650.0000 mg | ORAL_TABLET | Freq: Four times a day (QID) | ORAL | Status: DC | PRN
Start: 2014-01-09 — End: 2014-01-09

## 2014-01-09 MED ORDER — CLOPIDOGREL BISULFATE 300 MG PO TABS
ORAL_TABLET | ORAL | Status: AC
  Filled 2014-01-09: qty 2

## 2014-01-09 MED ORDER — ACETAMINOPHEN 650 MG RE SUPP: 650.0000 mg | Freq: Four times a day (QID) | RECTAL | Status: DC | PRN

## 2014-01-09 MED ORDER — ALBUTEROL SULFATE (2.5 MG/3ML) 0.083% IN NEBU: 3.0000 mL | INHALATION_SOLUTION | Freq: Four times a day (QID) | RESPIRATORY_TRACT | Status: DC | PRN

## 2014-01-09 MED ORDER — BIVALIRUDIN 250 MG IV SOLR
INTRAVENOUS | Status: AC
  Filled 2014-01-09: qty 250

## 2014-01-09 MED ORDER — SODIUM CHLORIDE 0.9 % IV SOLN
INTRAVENOUS | Status: DC
  Administered 2014-01-09: 07:00:00 via INTRAVENOUS

## 2014-01-09 MED ORDER — ASPIRIN 81 MG PO CHEW: 81.0000 mg | CHEWABLE_TABLET | ORAL | Status: DC

## 2014-01-09 MED ORDER — ONDANSETRON HCL 4 MG PO TABS: 4.0000 mg | ORAL_TABLET | Freq: Four times a day (QID) | ORAL | Status: DC | PRN

## 2014-01-09 MED ORDER — HEPARIN SODIUM (PORCINE) 1000 UNIT/ML IJ SOLN
INTRAMUSCULAR | Status: AC
  Filled 2014-01-09: qty 1

## 2014-01-09 MED ORDER — ROSUVASTATIN CALCIUM 20 MG PO TABS
20.0000 mg | ORAL_TABLET | Freq: Every evening | ORAL | Status: DC
  Administered 2014-01-09: 20 mg via ORAL
  Filled 2014-01-09 (×3): qty 1

## 2014-01-09 MED ORDER — ACETAMINOPHEN 325 MG PO TABS: 650.0000 mg | ORAL_TABLET | ORAL | Status: DC | PRN

## 2014-01-09 MED ORDER — SODIUM CHLORIDE 0.9 % IJ SOLN
3.0000 mL | INTRAMUSCULAR | Status: DC | PRN
Start: 2014-01-09 — End: 2014-01-09

## 2014-01-09 NOTE — Progress Notes (Signed)
ANTICOAGULATION CONSULT NOTE - Initial Consult  Pharmacy Consult for IV Heparn Indication: chest pain/ACS  Allergies  Allergen Reactions  . Macrobid [Nitrofurantoin] Anaphylaxis    Patient Measurements:   Heparin Dosing Weight: 70 kg  Vital Signs: Temp: 98.4 F (36.9 C) (10/08 2109) Temp Source: Oral (10/08 2109) BP: 148/75 mmHg (10/08 2109) Pulse Rate: 80 (10/08 2109)  Labs:  Recent Labs  01/08/14 2130 01/08/14 2153  HGB  --  15.0  HCT  --  44.6  PLT  --  252  APTT 29  --   LABPROT 13.7  --   INR 1.04  --   CREATININE  --  0.81  TROPONINI  --  <0.30    The CrCl is unknown because both a height and weight (above a minimum accepted value) are required for this calculation.   Medical History: Past Medical History  Diagnosis Date  . Anxiety   . Hyperlipidemia   . Stenosis of iliac artery     Aortosclerosis, bilateral - mild with calcification. Per Dr. Moreen Fowler at [facility]   . Interstitial lung disease   . Pulmonary nodule, right RIGHT UPPER LOBE-- STABLE PER CHEST CT 08-17-2011  . Right ureteral stone   . History of kidney stones   . 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 Frequency of urination   . Urgency of urination   . COPD (chronic obstructive pulmonary disease) with emphysema     followed by pcp  dr [name]  . Glaucoma of both eyes   . Arthritis     Medications:  Scheduled:   Infusions:  . sodium chloride 1,000 mL (01/08/14 2234)    Assessment: 67 yoM c/o CP.  POC Troponin = 0.09 >0.30 Goal of Therapy:  Heparin level 0.3-0.7 units/ml Monitor platelets by anticoagulation protocol: Yes   Plan:   Ht/wt Stat  Heparin 4000 units IV x1  Start drip @ 850 units/hr  Daily CBC/HL  Check 1st HL in 8 hours  Lawana Pai R 01/09/2014,12:51 AM

## 2014-01-09 NOTE — Progress Notes (Signed)
Echocardiogram 2D Echocardiogram has been performed.  Connor Cuevas 01/09/2014, 10:44 AM

## 2014-01-09 NOTE — H&P (View-Only) (Signed)
CARDIOLOGY CONSULT NOTE       Patient ID: Connor Cuevas MRN: [id number] DOB/AGE: 1940/04/21 73 y.o.  Admit date: 01/08/2014 Referring Physician:  Darrick Meigs Primary Physician: [name], MD Primary Cardiologist:  New/Vang Kraeger  Reason for Consultation: Chest pain  Principal Problem:   Chest pain Active Problems:   History of COPD   Dyslipidemia   HPI:   73 yo smoker with COPD  6 days or so of SSCP  Exertional worse when moving lawn.  Relieved with rest.  Getting some rest pain now at night.  No history of CAD.  History of small AAA less than 3.5 cm found During GU work up.  In ER istat troponin postitive but regular troponin negative no acute ECG changes.  CRF smoking and elevated cholesterol  Currently pain free Compliant with meds  Activity in general limited by Dyspnea   ROS All other systems reviewed and negative except as noted above  Past Medical History  Diagnosis Date  . Anxiety   . Hyperlipidemia   . Stenosis of iliac artery     Aortosclerosis, bilateral - mild with calcification. Per Dr. Moreen Fowler at [facility]   . Interstitial lung disease   . Pulmonary nodule, right RIGHT UPPER LOBE-- STABLE PER CHEST CT 08-17-2011  . Right ureteral stone   . History of kidney stones   . 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 Frequency of urination   . Urgency of urination   . COPD (chronic obstructive pulmonary disease) with emphysema     followed by pcp  dr [name]  . Glaucoma of both eyes   . Arthritis     Family History  Problem Relation Age of Onset  . Hypertension Mother   . Heart attack Other   . Stroke Other     History   Social History  . Marital Status: Married    Spouse Name: N/A    Number of Children: N/A  . Years of Education: N/A   Occupational History  . Not on file.   Social History Main Topics  . Smoking status: Current Every Day Smoker -- 1.00 packs/day for 53 years    Types: Cigarettes  . Smokeless  tobacco: Never Used  . Alcohol Use: No  . Drug Use: No  . Sexual Activity: Yes   Other Topics Concern  . Not on file   Social History Narrative  . No narrative on file    Past Surgical History  Procedure Laterality Date  . Repair right inguinal hernia w/ mesh  12-31-2000  . Left ureteroscopic stone extraction  11-30-2000    W/ PREVIOUS ESWL AND URETERAL STENT PLACEMENT  . Laparoscopic left inguinal (obturator) hernia and umbilical hernia repair  01-13-2011  . Ureterolithotomy  2009  . Cataract extraction w/ intraocular lens  implant, bilateral  1997 (APPROX)  . Cystoscopy/retrograde/ureteroscopy/stone extraction with basket  10/17/2011    Procedure: CYSTOSCOPY/RETROGRADE/URETEROSCOPY/STONE EXTRACTION WITH BASKET;  Surgeon: Hanley Ben, MD;  Location: [facility];  Service: Urology;  Laterality: Right;     . aspirin EC  325 mg Oral Daily  . brimonidine  1 drop Both Eyes TID  . dorzolamide-timolol  1 drop Both Eyes BID  . [START ON 01/10/2014] Influenza vac split quadrivalent PF  0.5 mL Intramuscular Tomorrow-1000  . rosuvastatin  20 mg Oral QPM  . sodium chloride  3 mL Intravenous Q12H  . tiotropium  18 mcg Inhalation q morning - 10a  .  traZODone  75-150 mg Oral QHS   . sodium chloride 10 mL/hr at 01/09/14 0654  . heparin 850 Units/hr (01/09/14 0118)    Physical Exam: Blood pressure 141/75, pulse 79, temperature 97.9 F (36.6 C), temperature source Oral, resp. rate 18, height 5' 9.5" (1.765 m), weight 151 lb 10.8 oz (68.8 kg), SpO2 95.00%.   Affect appropriate Chronically ill appearing smoker with poor dentition  HEENT: normal Neck supple with no adenopathy JVP normal no bruits no thyromegaly Lungs clear with no wheezing and good diaphragmatic motion Heart:  S1/S2 no murmur, no rub, gallop or click PMI normal Abdomen: benighn, BS positve, no tenderness, no AAA no bruit.  No HSM or HJR Distal pulses intact with no bruits No edema Neuro  non-focal Skin warm and dry No muscular weakness   Labs:   Lab Results  Component Value Date   WBC 10.3 01/09/2014   HGB 14.6 01/09/2014   HCT 44.7 01/09/2014   MCV 89.9 01/09/2014   PLT 267 01/09/2014    Recent Labs Lab 01/09/14 0525  NA 141  K 4.0  CL 105  CO2 21  BUN 20  CREATININE 0.82  CALCIUM 8.9  PROT 6.3  BILITOT 0.4  ALKPHOS 55  ALT 10  AST 15  GLUCOSE 161*   Lab Results  Component Value Date   CKTOTAL 80 08/26/2008   CKMB 1.5 08/26/2008   TROPONINI <0.30 01/09/2014     Radiology: Dg Chest Portable 1 View  01/08/2014   CLINICAL DATA:  Inner min episodes of intense left-sided chest pain extending to the jaw.  EXAM: PORTABLE CHEST - 1 VIEW  COMPARISON:  CT chest 08/17/2011.  FINDINGS: The heart size is normal. The right upper lobe pulmonary nodule is again noted. Mild interstitial coarsening is chronic. There is no significant superimposed disease. The visualized soft tissues and bony thorax are unremarkable.  IMPRESSION: 1. No acute cardiopulmonary disease or significant interval change. 2. Chronic interstitial coarsening. 3. Right upper lobe pulmonary nodule.   Electronically Signed   By: Lawrence Santiago M.D.   On: 01/08/2014 22:52    EKG:  SR rate 89 no acute changes   ASSESSMENT AND PLAN:  Chest Pain:  Worrisome for angina  Discussed diagnostic cath willing to proceed.  Spoke with hosptialist and will transfer to my service at [facility].  Start beta blocker   AAA:  3.3 cm  F/u duplex in  A year Chol  Statin COPD:  No active wheezing f/u primary at [facility]  Discussed smoking cessation especially in light of possible CAD  Further recommendations based on results of cath   Signed: [name] 01/09/2014, 9:12 AM

## 2014-01-09 NOTE — CV Procedure (Signed)
       Catheterization   Indication:  SEMI  Procedure: After informed consent and clinical "time out" the right groin was prepped and draped in a sterile fashion.  A 5Fr sheath was placed in the right femoral artery using seldinger technique and local lidocaine.  Standard JL4, JR4 and angled pigtail catheters were used to engage the coronary arteries.  Coronary arteries were visualized in orthogonal views using caudal and cranial angulation. Medications:   Versed: 2 mg's  Fentanyl: 0 ug's  Coronary Arteries: Right dominant with no anomalies  LM:  Normal  LAD:  Normal    D1: Normal  Circumflex:  normal   OM1:  Normal   RCA:  99% mid vessel  With collateral flow to the PLA   PDA: normal  PLA:  Competitive flow  EF 55-60% by echo with no MR  Hemodynamics:  Aortic Pressure:  150 78   mmHg  LV Pressure: 153 3  mmHg  Impression:   Single vessel disease with SEMI  Discussed with Dr Burt Knack will proceed with PCI/Stent RCA  Given dyspnea and COPD will use Plavix  [name] 01/09/2014 1:55 PM

## 2014-01-09 NOTE — Progress Notes (Signed)
Pharmacy -   Assessment: 73yoM on heparin for CP/ACS.  To be transferred to [facility] for heart cath. HL this AM 0.16 (goal 0.3 - 0.7)  Last CBC wnl  Plan: Increase rate by 1.25x to 1050 units/hr before transfer   Reuel Boom, PharmD Pager: [phone] 01/09/2014, 11:23 AM

## 2014-01-09 NOTE — Consult Note (Signed)
CARDIOLOGY CONSULT NOTE       Patient ID: Connor Cuevas MRN: [id number] DOB/AGE: Jan 17, 1941 73 y.o.  Admit date: 01/08/2014 Referring Physician:  Darrick Meigs Primary Physician: [name], MD Primary Cardiologist:  New/Tniya Bowditch  Reason for Consultation: Chest pain  Principal Problem:   Chest pain Active Problems:   History of COPD   Dyslipidemia   HPI:   73 yo smoker with COPD  6 days or so of SSCP  Exertional worse when moving lawn.  Relieved with rest.  Getting some rest pain now at night.  No history of CAD.  History of small AAA less than 3.5 cm found During GU work up.  In ER istat troponin postitive but regular troponin negative no acute ECG changes.  CRF smoking and elevated cholesterol  Currently pain free Compliant with meds  Activity in general limited by Dyspnea   ROS All other systems reviewed and negative except as noted above  Past Medical History  Diagnosis Date  . Anxiety   . Hyperlipidemia   . Stenosis of iliac artery     Aortosclerosis, bilateral - mild with calcification. Per Dr. Moreen Fowler at [facility]   . Interstitial lung disease   . Pulmonary nodule, right RIGHT UPPER LOBE-- STABLE PER CHEST CT 08-17-2011  . Right ureteral stone   . History of kidney stones   . 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 Frequency of urination   . Urgency of urination   . COPD (chronic obstructive pulmonary disease) with emphysema     followed by pcp  dr [name]  . Glaucoma of both eyes   . Arthritis     Family History  Problem Relation Age of Onset  . Hypertension Mother   . Heart attack Other   . Stroke Other     History   Social History  . Marital Status: Married    Spouse Name: N/A    Number of Children: N/A  . Years of Education: N/A   Occupational History  . Not on file.   Social History Main Topics  . Smoking status: Current Every Day Smoker -- 1.00 packs/day for 53 years    Types: Cigarettes  . Smokeless  tobacco: Never Used  . Alcohol Use: No  . Drug Use: No  . Sexual Activity: Yes   Other Topics Concern  . Not on file   Social History Narrative  . No narrative on file    Past Surgical History  Procedure Laterality Date  . Repair right inguinal hernia w/ mesh  12-31-2000  . Left ureteroscopic stone extraction  11-30-2000    W/ PREVIOUS ESWL AND URETERAL STENT PLACEMENT  . Laparoscopic left inguinal (obturator) hernia and umbilical hernia repair  01-13-2011  . Ureterolithotomy  2009  . Cataract extraction w/ intraocular lens  implant, bilateral  1997 (APPROX)  . Cystoscopy/retrograde/ureteroscopy/stone extraction with basket  10/17/2011    Procedure: CYSTOSCOPY/RETROGRADE/URETEROSCOPY/STONE EXTRACTION WITH BASKET;  Surgeon: Hanley Ben, MD;  Location: [facility];  Service: Urology;  Laterality: Right;     . aspirin EC  325 mg Oral Daily  . brimonidine  1 drop Both Eyes TID  . dorzolamide-timolol  1 drop Both Eyes BID  . [START ON 01/10/2014] Influenza vac split quadrivalent PF  0.5 mL Intramuscular Tomorrow-1000  . rosuvastatin  20 mg Oral QPM  . sodium chloride  3 mL Intravenous Q12H  . tiotropium  18 mcg Inhalation q morning - 10a  .  traZODone  75-150 mg Oral QHS   . sodium chloride 10 mL/hr at 01/09/14 0654  . heparin 850 Units/hr (01/09/14 0118)    Physical Exam: Blood pressure 141/75, pulse 79, temperature 97.9 F (36.6 C), temperature source Oral, resp. rate 18, height 5' 9.5" (1.765 m), weight 151 lb 10.8 oz (68.8 kg), SpO2 95.00%.   Affect appropriate Chronically ill appearing smoker with poor dentition  HEENT: normal Neck supple with no adenopathy JVP normal no bruits no thyromegaly Lungs clear with no wheezing and good diaphragmatic motion Heart:  S1/S2 no murmur, no rub, gallop or click PMI normal Abdomen: benighn, BS positve, no tenderness, no AAA no bruit.  No HSM or HJR Distal pulses intact with no bruits No edema Neuro  non-focal Skin warm and dry No muscular weakness   Labs:   Lab Results  Component Value Date   WBC 10.3 01/09/2014   HGB 14.6 01/09/2014   HCT 44.7 01/09/2014   MCV 89.9 01/09/2014   PLT 267 01/09/2014    Recent Labs Lab 01/09/14 0525  NA 141  K 4.0  CL 105  CO2 21  BUN 20  CREATININE 0.82  CALCIUM 8.9  PROT 6.3  BILITOT 0.4  ALKPHOS 55  ALT 10  AST 15  GLUCOSE 161*   Lab Results  Component Value Date   CKTOTAL 80 08/26/2008   CKMB 1.5 08/26/2008   TROPONINI <0.30 01/09/2014     Radiology: Dg Chest Portable 1 View  01/08/2014   CLINICAL DATA:  Inner min episodes of intense left-sided chest pain extending to the jaw.  EXAM: PORTABLE CHEST - 1 VIEW  COMPARISON:  CT chest 08/17/2011.  FINDINGS: The heart size is normal. The right upper lobe pulmonary nodule is again noted. Mild interstitial coarsening is chronic. There is no significant superimposed disease. The visualized soft tissues and bony thorax are unremarkable.  IMPRESSION: 1. No acute cardiopulmonary disease or significant interval change. 2. Chronic interstitial coarsening. 3. Right upper lobe pulmonary nodule.   Electronically Signed   By: Lawrence Santiago M.D.   On: 01/08/2014 22:52    EKG:  SR rate 89 no acute changes   ASSESSMENT AND PLAN:  Chest Pain:  Worrisome for angina  Discussed diagnostic cath willing to proceed.  Spoke with hosptialist and will transfer to my service at [facility].  Start beta blocker   AAA:  3.3 cm  F/u duplex in  A year Chol  Statin COPD:  No active wheezing f/u primary at [facility]  Discussed smoking cessation especially in light of possible CAD  Further recommendations based on results of cath   Signed: [name] 01/09/2014, 9:12 AM

## 2014-01-09 NOTE — Progress Notes (Signed)
[facility] PROGRESS NOTE  Connor Cuevas GLO:[id number] DOB: November 21, 1940 DOA: 01/08/2014 PCP: [name], MD  Assessment/Plan: 1. Chest pain- Very typical presentation, concerning for angina. Called cardiology consultation, discussed with Dr [name], who says that the patient needs to be transferred to [facility] hospital for cardiac catheterization. He will take over the service. Patient has been stated on heparin per cardiology. Continue with aspirin, Crestor. 2. Will transfer to [facility] hospital under cardiology service. 3. COPD- Stable, continue on Tiotropium.  Code Status: Full code Family Communication: *No family at bedside Disposition Plan: Tranfers to [facility]   Consultants:  Cardiology  Procedures:  None  Antibiotics:  None  HPI/Subjective: 73 y.o. male with history of COPD, hyperlipidemia and tobacco abuse has been experiencing chest pain off and on for last 10 days. Patient states over the last 10 days has been having retrosternal chest pressure which radiates to his neck and jaw even at rest. Patient's chest pain lasts around 3 minutes and resolves spontaneously. Has mild associated shortness of breath. Denies any lower extremity swelling no recent travel. In the ER point of care troponins were positive but regular troponin was negative. EKG was showing nonspecific findings and presently chest pain-free. On-call cardiologist Dr. Radford Pax was consulted. At this time cardiologist as recommended admission with setting of cardiac markers. Patient denies any nausea vomiting abdominal pain. Patient has history of hyperlipidemia and had stopped taking Crestor 3 months ago by patient's own decision.  He denies pain at this time.     Objective: Filed Vitals:   01/09/14 0644  BP: 141/75  Pulse: 79  Temp: 97.9 F (36.6 C)  Resp: 18    Intake/Output Summary (Last 24 hours) at 01/09/14 0945 Last data filed at 01/09/14 0700  Gross per 24 hour  Intake    360 ml  Output       0 ml  Net    360 ml   Filed Weights   01/09/14 0054 01/09/14 0145 01/09/14 0350  Weight: 70.308 kg (155 lb) 70.308 kg (155 lb) 68.8 kg (151 lb 10.8 oz)    Exam:  Physical Exam: Head: Normocephalic, atraumatic.  Eyes: No signs of jaundice, EOMI Nose: Mucous membranes dry.  Throat: Oropharynx nonerythematous, no exudate appreciated.  Neck: supple,No deformities, masses, or tenderness noted. Lungs: Normal respiratory effort. B/L Clear to auscultation, no crackles or wheezes.  Heart: Regular RR. S1 and S2 normal  Abdomen: BS normoactive. Soft, Nondistended, non-tender.  Extremities: No pretibial edema, no erythema   Data Reviewed: Basic Metabolic Panel:  Recent Labs Lab 01/08/14 2153 01/09/14 0525  NA 141 141  K 4.0 4.0  CL 104 105  CO2 23 21  GLUCOSE 184* 161*  BUN 19 20  CREATININE 0.81 0.82  CALCIUM 9.1 8.9   Liver Function Tests:  Recent Labs Lab 01/09/14 0525  AST 15  ALT 10  ALKPHOS 55  BILITOT 0.4  PROT 6.3  ALBUMIN 3.3*   No results found for this basename: LIPASE, AMYLASE,  in the last 168 hours No results found for this basename: AMMONIA,  in the last 168 hours CBC:  Recent Labs Lab 01/08/14 2153 01/09/14 0525  WBC 9.1 10.3  NEUTROABS 5.1 5.7  HGB 15.0 14.6  HCT 44.6 44.7  MCV 89.0 89.9  PLT 252 267   Cardiac Enzymes:  Recent Labs Lab 01/08/14 2153 01/09/14 0525  TROPONINI <0.30 <0.30   BNP (last 3 results) No results found for this basename: PROBNP,  in the last 8760 hours  CBG:  Recent Labs Lab 01/09/14 0639  GLUCAP 123*    No results found for this or any previous visit (from the past 240 hour(s)).   Studies: Dg Chest Portable 1 View  01/08/2014   CLINICAL DATA:  Inner min episodes of intense left-sided chest pain extending to the jaw.  EXAM: PORTABLE CHEST - 1 VIEW  COMPARISON:  CT chest 08/17/2011.  FINDINGS: The heart size is normal. The right upper lobe pulmonary nodule is again noted. Mild interstitial coarsening  is chronic. There is no significant superimposed disease. The visualized soft tissues and bony thorax are unremarkable.  IMPRESSION: 1. No acute cardiopulmonary disease or significant interval change. 2. Chronic interstitial coarsening. 3. Right upper lobe pulmonary nodule.   Electronically Signed   By: Lawrence Santiago M.D.   On: 01/08/2014 22:52    Scheduled Meds: . aspirin EC  325 mg Oral Daily  . brimonidine  1 drop Both Eyes TID  . dorzolamide-timolol  1 drop Both Eyes BID  . [START ON 01/10/2014] Influenza vac split quadrivalent PF  0.5 mL Intramuscular Tomorrow-1000  . rosuvastatin  20 mg Oral QPM  . sodium chloride  3 mL Intravenous Q12H  . tiotropium  18 mcg Inhalation q morning - 10a  . traZODone  75-150 mg Oral QHS   Continuous Infusions: . sodium chloride 10 mL/hr at 01/09/14 0654  . heparin 850 Units/hr (01/09/14 0118)    Principal Problem:   Chest pain Active Problems:   History of COPD   Dyslipidemia    Time spent: *25 min    [facility] Pager [phone]. If 7PM-7AM, please contact night-coverage at [email or web address], password [facility] 01/09/2014, 9:45 AM  LOS: 1 day

## 2014-01-09 NOTE — H&P (Signed)
[facility] History and Physical  Connor Cuevas XNA:[id number] DOB: Jul 27, 1940 DOA: 01/08/2014  Referring physician: ER physician. PCP: [name], MD  Chief Complaint: Chest pain.  HPI: Connor Cuevas is a 73 y.o. male with history of COPD, hyperlipidemia and tobacco abuse has been experiencing chest pain off and on for last 10 days. Patient states over the last 10 days has been having retrosternal chest pressure which radiates to his neck and jaw even at rest. Patient's chest pain lasts around 3 minutes and resolves spontaneously. Has mild associated shortness of breath. Denies any lower extremity swelling no recent travel. In the ER point of care troponins were positive but regular troponin was negative. EKG was showing nonspecific findings and presently chest pain-free. On-call cardiologist Dr. Radford Pax was consulted. At this time cardiologist as recommended admission with setting of cardiac markers. Patient denies any nausea vomiting abdominal pain. Patient has history of hyperlipidemia and had stopped taking Crestor 3 months ago by patient's own decision.  Review of Systems: As presented in the history of presenting illness, rest negative.  Past Medical History  Diagnosis Date  . Anxiety   . Hyperlipidemia   . Stenosis of iliac artery     Aortosclerosis, bilateral - mild with calcification. Per Dr. Moreen Fowler at [facility]   . Interstitial lung disease   . Pulmonary nodule, right RIGHT UPPER LOBE-- STABLE PER CHEST CT 08-17-2011  . Right ureteral stone   . History of kidney stones   . 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 Frequency of urination   . Urgency of urination   . COPD (chronic obstructive pulmonary disease) with emphysema     followed by pcp  dr [name]  . Glaucoma of both eyes   . Arthritis    Past Surgical History  Procedure Laterality Date  . Repair right inguinal hernia w/ mesh  12-31-2000  . Left ureteroscopic stone  extraction  11-30-2000    W/ PREVIOUS ESWL AND URETERAL STENT PLACEMENT  . Laparoscopic left inguinal (obturator) hernia and umbilical hernia repair  01-13-2011  . Ureterolithotomy  2009  . Cataract extraction w/ intraocular lens  implant, bilateral  1997 (APPROX)  . Cystoscopy/retrograde/ureteroscopy/stone extraction with basket  10/17/2011    Procedure: CYSTOSCOPY/RETROGRADE/URETEROSCOPY/STONE EXTRACTION WITH BASKET;  Surgeon: Hanley Ben, MD;  Location: [facility];  Service: Urology;  Laterality: Right;   Social History:  reports that he has been smoking Cigarettes.  He has a 53 pack-year smoking history. He has never used smokeless tobacco. He reports that he does not drink alcohol or use illicit drugs. Where does patient live home. Can patient participate in ADLs? Yes.  Allergies  Allergen Reactions  . Macrobid [Nitrofurantoin] Anaphylaxis    Family History:  Family History  Problem Relation Age of Onset  . Hypertension Mother   . Heart attack Other   . Stroke Other       Prior to Admission medications   Medication Sig Start Date End Date Taking? Authorizing Provider  albuterol (PROVENTIL HFA;VENTOLIN HFA) 108 (90 BASE) MCG/ACT inhaler Inhale 2 puffs into the lungs every 6 (six) hours as needed for wheezing or shortness of breath.   Yes Historical Provider, MD  aspirin 81 MG tablet Take 81 mg by mouth daily.    Yes Historical Provider, MD  Brimonidine Tartrate (ALPHAGAN P OP) Apply 1 drop to eye 2 (two) times daily.   Yes Historical Provider, MD  dorzolamide-timolol (COSOPT) 22.3-6.8 MG/ML ophthalmic  solution Place 1 drop into both eyes 2 (two) times daily.   Yes Historical Provider, MD  HYDROcodone-acetaminophen (NORCO) 5-325 MG per tablet Take 0.5 tablets by mouth every 6 (six) hours as needed for moderate pain. For pain   Yes Historical Provider, MD  LORazepam (ATIVAN) 0.5 MG tablet Take 0.5 mg by mouth daily as needed. For anxiety   Yes Historical Provider,  MD  Omega-3 Fatty Acids (FISH OIL) 1000 MG CAPS Take by mouth daily.    Yes Historical Provider, MD  tiotropium (SPIRIVA) 18 MCG inhalation capsule Place 18 mcg into inhaler and inhale every morning.    Yes Historical Provider, MD  traZODone (DESYREL) 150 MG tablet Take 75-150 mg by mouth at bedtime. Starts with half a tab if that doesn't work he takes the other half   Yes Historical Provider, MD  rosuvastatin (CRESTOR) 20 MG tablet Take 20 mg by mouth every evening.     Historical Provider, MD    Physical Exam: Filed Vitals:   01/08/14 2109 01/09/14 0054 01/09/14 0145  BP: 148/75    Pulse: 80    Temp: 98.4 F (36.9 C)    TempSrc: Oral    Resp: 20    Height:  5' 9.5" (1.765 m) 5' 9.5" (1.765 m)  Weight:  70.308 kg (155 lb) 70.308 kg (155 lb)  SpO2: 98%       General:  Well-developed and nourished.  Eyes: Anicteric no pallor.  ENT: No discharge from ears eyes nose mouth.  Neck: No mass felt.  Cardiovascular: S1-S2 heard.  Respiratory: No rhonchi or crepitations.  Abdomen: Soft nontender bowel sounds present no guarding rigidity.  Skin: No rash.  Musculoskeletal: No edema.  Psychiatric: Appears normal.  Neurologic: Alert awake oriented to time place and person. Moves all extremities.  Labs on Admission:  Basic Metabolic Panel:  Recent Labs Lab 01/08/14 2153  NA 141  K 4.0  CL 104  CO2 23  GLUCOSE 184*  BUN 19  CREATININE 0.81  CALCIUM 9.1   Liver Function Tests: No results found for this basename: AST, ALT, ALKPHOS, BILITOT, PROT, ALBUMIN,  in the last 168 hours No results found for this basename: LIPASE, AMYLASE,  in the last 168 hours No results found for this basename: AMMONIA,  in the last 168 hours CBC:  Recent Labs Lab 01/08/14 2153  WBC 9.1  NEUTROABS 5.1  HGB 15.0  HCT 44.6  MCV 89.0  PLT 252   Cardiac Enzymes:  Recent Labs Lab 01/08/14 2153  TROPONINI <0.30    BNP (last 3 results) No results found for this basename: PROBNP,   in the last 8760 hours CBG: No results found for this basename: GLUCAP,  in the last 168 hours  Radiological Exams on Admission: Dg Chest Portable 1 View  01/08/2014   CLINICAL DATA:  Inner min episodes of intense left-sided chest pain extending to the jaw.  EXAM: PORTABLE CHEST - 1 VIEW  COMPARISON:  CT chest 08/17/2011.  FINDINGS: The heart size is normal. The right upper lobe pulmonary nodule is again noted. Mild interstitial coarsening is chronic. There is no significant superimposed disease. The visualized soft tissues and bony thorax are unremarkable.  IMPRESSION: 1. No acute cardiopulmonary disease or significant interval change. 2. Chronic interstitial coarsening. 3. Right upper lobe pulmonary nodule.   Electronically Signed   By: Lawrence Santiago M.D.   On: 01/08/2014 22:52    EKG: Independently reviewed. Normal sinus rhythm with poor R-wave progression and old anterior  infarct. Nonspecific ST-T changes.  Assessment/Plan Principal Problem:   Chest pain Active Problems:   History of COPD   Dyslipidemia   1. Chest pain concerning for unstable angina - patient has been started on heparin infusion. Cycle cardiac markers. Aspirin. Check 2-D echo. Keep n.p.o. in anticipation of possible procedure. I did discuss with cardiologist Dr. Stephens Shire on-call cardiologist. 2. COPD - presently not wheezing. Continue home inhalers. 3. Tobacco abuse - strongly advised to quit smoking. 4. Hyperlipidemia - restarted statins.    Code Status: Full code.  Family Communication: None.  Disposition Plan: Admit for observation.    Cristyn Crossno N. [facility] Pager [phone].  If 7PM-7AM, please contact night-coverage [email or web address] Password [email or web address] 01/09/2014, 2:28 AM

## 2014-01-09 NOTE — Care Management Note (Addendum)
  Page 1 of 1   01/09/2014     3:35:14 PM CARE MANAGEMENT NOTE 01/09/2014  Patient:  JAVAUN, DIMPERIO   Account Number:  [id number]  Date Initiated:  01/09/2014  Documentation initiated by:  Mariann Laster  Subjective/Objective Assessment:   CP  Procedure: PTCA and stenting of the mid-RCA     Action/Plan:   CM to follow for disposition needs.   Anticipated DC Date:  01/10/2014   Anticipated DC Plan:  HOME/SELF CARE         Choice offered to / List presented to:             Status of service:  Completed, signed off Medicare Important Message given?  NA - LOS <3 / Initial given by admissions (If response is "NO", the following Medicare IM given date fields will be blank) Date Medicare IM given:   Medicare IM given by:   Date Additional Medicare IM given:   Additional Medicare IM given by:    Discharge Disposition:  HOME/SELF CARE  Per UR Regulation:  Reviewed for med. necessity/level of care/duration of stay  If discussed at [place] of Stay Meetings, dates discussed:    Comments:  Thai Burgueno RN, BSN, MSHL, CCM  Nurse - Case Manager,  (Unit [phone]  01/09/2014 Med Review:  Plavix Dispo: Home self care

## 2014-01-09 NOTE — CV Procedure (Signed)
    CARDIAC CATH NOTE  Name: DEMARQUEZ CIOLEK MRN: [id number] DOB: 03/15/1941  Procedure: PTCA and stenting of the mid-RCA  Indication: Unstable angina  Procedural Details: The patient underwent diagnostic cath by Dr [name]. 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 After review of the films, we elected to proceed with PCI. The patient has no hx of bleeding problems and has no plans for upcoming surgical procedures. The sheath was upsized to a 6 Fr. He was loaded with Plavix 600 mg.  Weight-based bivalirudin was given for anticoagulation. Once a therapeutic ACT was achieved, a 6 [place] JR4 guide catheter was inserted.  A cougar coronary guidewire was used to cross the lesion.  The lesion was predilated with a 2.5 mm balloon.  The lesion was then stented with a 3.25x18 mm Xience Alpine DES stent.  The stent was postdilated with a 3.5 mm noncompliant balloon.  Following PCI, there was 0% residual stenosis and TIMI-3 flow. Final angiography confirmed an excellent result. The patient tolerated the procedure well. There were no immediate procedural complications. A perclose device was used for hemostasis. The patient was transferred to the post catheterization recovery area for further monitoring.  Lesion Data: Vessel: RCA/mid Percent stenosis (pre): 99 TIMI-flow (pre):  3 Stent:  3.25x18 mm Xience Percent stenosis (post): 0 TIMI-flow (post): 3  Estimated Blood Loss: minimal  Conclusions: Successful PCI of the RCA as above (DES)  Recommendations: ASA and plavix x 12 months. Anticipate home tomorrow.  Sherren Mocha MD, [facility] 01/09/2014, 2:43 PM

## 2014-01-09 NOTE — Interval H&P Note (Signed)
History and Physical Interval Note:  01/09/2014 2:47 PM  Connor Cuevas  has presented today for surgery, with the diagnosis of cp  The various methods of treatment have been discussed with the patient and family. After consideration of risks, benefits and other options for treatment, the patient has consented to  Procedure(s): LEFT HEART CATHETERIZATION WITH CORONARY ANGIOGRAM (N/A) as a surgical intervention .  The patient's history has been reviewed, patient examined, no change in status, stable for surgery.  I have reviewed the patient's chart and labs.  Questions were answered to the patient's satisfaction.    Cath Lab Visit (complete for each Cath Lab visit)  Clinical Evaluation Leading to the Procedure:   ACS: Yes.    Non-ACS:    Anginal Classification: CCS IV  Anti-ischemic medical therapy: No Therapy  Non-Invasive Test Results: No non-invasive testing performed  Prior CABG: No previous CABG       Sherren Mocha

## 2014-01-10 ENCOUNTER — Encounter (HOSPITAL_COMMUNITY): Payer: Self-pay | Admitting: Physician Assistant

## 2014-01-10 DIAGNOSIS — I771 Stricture of artery: Secondary | ICD-10-CM | POA: Diagnosis present

## 2014-01-10 DIAGNOSIS — Z720 Tobacco use: Secondary | ICD-10-CM | POA: Diagnosis present

## 2014-01-10 DIAGNOSIS — I200 Unstable angina: Secondary | ICD-10-CM

## 2014-01-10 DIAGNOSIS — J449 Chronic obstructive pulmonary disease, unspecified: Secondary | ICD-10-CM | POA: Diagnosis present

## 2014-01-10 DIAGNOSIS — I2510 Atherosclerotic heart disease of native coronary artery without angina pectoris: Secondary | ICD-10-CM | POA: Diagnosis present

## 2014-01-10 DIAGNOSIS — I25110 Atherosclerotic heart disease of native coronary artery with unstable angina pectoris: Secondary | ICD-10-CM | POA: Diagnosis not present

## 2014-01-10 DIAGNOSIS — I714 Abdominal aortic aneurysm, without rupture, unspecified: Secondary | ICD-10-CM | POA: Diagnosis present

## 2014-01-10 DIAGNOSIS — E785 Hyperlipidemia, unspecified: Secondary | ICD-10-CM | POA: Diagnosis present

## 2014-01-10 LAB — BASIC METABOLIC PANEL
Anion gap: 15 (ref 5–15)
BUN: 18 mg/dL (ref 6–23)
CO2: 21 mEq/L (ref 19–32)
CREATININE: 0.79 mg/dL (ref 0.50–1.35)
Calcium: 9.2 mg/dL (ref 8.4–10.5)
Chloride: 105 mEq/L (ref 96–112)
GFR calc Af Amer: >90 mL/min (ref >90)
GFR calc non Af Amer: 88 mL/min — ABNORMAL LOW (ref >90)
GLUCOSE: 89 mg/dL (ref 70–99)
Potassium: 4.5 mEq/L (ref 3.7–5.3)
Sodium: 141 mEq/L (ref 137–147)

## 2014-01-10 LAB — CBC
HEMATOCRIT: 44.8 % (ref 39.0–52.0)
HEMOGLOBIN: 14.7 g/dL (ref 13.0–17.0)
MCH: 29.5 pg (ref 26.0–34.0)
MCHC: 32.8 g/dL (ref 30.0–36.0)
MCV: 90 fL (ref 78.0–100.0)
Platelets: 253 10*3/uL (ref 150–400)
RBC: 4.98 MIL/uL (ref 4.22–5.81)
RDW: 13.7 % (ref 11.5–15.5)
WBC: 10.2 10*3/uL (ref 4.0–10.5)

## 2014-01-10 LAB — GLUCOSE, CAPILLARY: GLUCOSE-CAPILLARY: 97 mg/dL (ref 70–99)

## 2014-01-10 MED ORDER — CLOPIDOGREL BISULFATE 75 MG PO TABS: 75.0000 mg | ORAL_TABLET | Freq: Every day | ORAL | Status: DC

## 2014-01-10 MED ORDER — NITROGLYCERIN 0.4 MG SL SUBL: 0.4000 mg | SUBLINGUAL_TABLET | SUBLINGUAL | Status: DC | PRN

## 2014-01-10 MED ORDER — NICOTINE 14 MG/24HR TD PT24: 14.0000 mg | MEDICATED_PATCH | Freq: Every day | TRANSDERMAL | Status: DC

## 2014-01-10 NOTE — Discharge Summary (Signed)
Discharge Summary   Patient ID: CARLISLE ENKE MRN: [id number], DOB/AGE: 1940-06-08 73 y.o. Admit date: 01/08/2014 D/C date:     01/10/2014  Primary Cardiologist: Dr. [name]   Principal Problem:   Unstable angina Active Problems:   Pulmonary nodule   Hyperlipidemia   CAD (coronary artery disease)   COPD (chronic obstructive pulmonary disease)   AAA (abdominal aortic aneurysm)   Stenosis of iliac artery   Tobacco abuse   Admission Dates: 01/08/14-01/10/14 Discharge Diagnosis:  [place] s/p DES to RCA   HPI: Connor Cuevas is a 73 y.o. male with a history of small AAA (<3.5cm), COPD, HLD, tobacco abuse and no prior cardiac disease who presented to [facility] ED on 01/08/14 with chest pain.   Upon admission, the patient reported that over the last 10 days he had been having retrosternal chest pressure which radiated to his neck and jaw even at rest. Patient's chest pain lasted around 3 minutes and resolved spontaneously. Has mild associated shortness of breath.    Hospital Course: His chest pain was worrisome for [place] in light of his many cardiac risk factors and he was admitted for further work up with coronary angiography.  Unstable angina -- POC troponin minimally abnormal, but lab troponin I levels normal and ECG with no acute ST or TW changes.  -- Underwent [facility] which revealed single vessel disease with SEMI. Given his dyspnea and COPD it was decided to proceed with stenting and he is now s/p successful DES to RCA  -- 2D ECHO with normal LV function  -- Continue ASA and plavix x 12 months and statin. No BB due to COPD.  AAA: 3.3 cm F/u duplex in A year   HLD- Continue Crestor 20mg  po qd   COPD: No active wheezing  -- Pulmonary nodule RUL followed by PCP -- F/u primary at [place] abuse- discussed smoking cessation. Would like to try patches. Has had a hard time quitting in the past. Failed Chantix. Wellbutrin an option if fails patches.   The patient has had an uncomplicated  hospital course and is recovering well. The femoral catheter site is stable. He has been seen by Dr. Domenic Polite today and deemed ready for discharge home. A voicemail was left with the office to call and make an appointment in the next 1-2 weeks. Smoking cessation was disscussed in length. Discharge medications are listed below.    Discharge Vitals: Blood pressure 142/80, pulse 78, temperature 97.5 F (36.4 C), temperature source Oral, resp. rate 18, height 5' 9.5" (1.765 m), weight 151 lb 14.4 oz (68.9 kg), SpO2 98.00%.  Labs: Lab Results  Component Value Date   WBC 10.2 01/10/2014   HGB 14.7 01/10/2014   HCT 44.8 01/10/2014   MCV 90.0 01/10/2014   PLT 253 01/10/2014     Recent Labs Lab 01/09/14 0525 01/10/14 0258  NA 141 141  K 4.0 4.5  CL 105 105  CO2 21 21  BUN 20 18  CREATININE 0.82 0.79  CALCIUM 8.9 9.2  PROT 6.3  --   BILITOT 0.4  --   ALKPHOS 55  --   ALT 10  --   AST 15  --   GLUCOSE 161* 89    Recent Labs  01/08/14 2153 01/09/14 0525 01/09/14 0915  TROPONINI <0.30 <0.30 <0.30    Diagnostic Studies/Procedures   Dg Chest Portable 1 View  01/08/2014   CLINICAL DATA:  Inner min episodes of intense left-sided chest pain extending to the  jaw.  EXAM: PORTABLE CHEST - 1 VIEW  COMPARISON:  CT chest 08/17/2011.  FINDINGS: The heart size is normal. The right upper lobe pulmonary nodule is again noted. Mild interstitial coarsening is chronic. There is no significant superimposed disease. The visualized soft tissues and bony thorax are unremarkable.  IMPRESSION: 1. No acute cardiopulmonary disease or significant interval change. 2. Chronic interstitial coarsening. 3. Right upper lobe pulmonary nodule.    2D ECHO: 01/09/2014 LV EF: 55% - 60% Study Conclusions - Left ventricle: The cavity size was normal. Systolic function was normal. The estimated ejection fraction was in the range of 55% to 60%. Wall motion was normal; there were no regional wall motion  abnormalities. - Atrial septum: There was increased thickness of the septum, consistent with lipomatous hypertrophy.         Catheterization  Indication: SEMI  Procedure: After informed consent and clinical "time out" the right groin was prepped and draped in a sterile fashion. A 5Fr sheath was placed in the right femoral artery using seldinger technique and local lidocaine. Standard JL4, JR4 and angled pigtail catheters were used to engage the coronary arteries. Coronary arteries were visualized in orthogonal views using caudal and cranial angulation.  Medications:  Versed: 2 mg's Fentanyl: 0 ug's  Coronary Arteries:  Right dominant with no anomalies  LM: Normal  LAD: Normal  D1: Normal  Circumflex: normal  OM1: Normal  RCA: 99% mid vessel With collateral flow to the PLA  PDA: normal  PLA: Competitive flow  EF 55-60% by echo with no MR  Hemodynamics:  Aortic Pressure: 150 78 mmHg LV Pressure: 153 3 mmHg  Impression: Single vessel disease with SEMI. Discussed with Dr Burt Knack will proceed with PCI/Stent RCA Given dyspnea and COPD will use Plavix     CARDIAC CATH NOTE  Name: Connor Cuevas  MRN: [id number]  DOB: [birth year]  Procedure: PTCA and stenting of the mid-RCA  Indication: Unstable angina  Procedural Details: The patient underwent diagnostic cath by Dr [name]. 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 After review of the films, we elected to proceed with PCI. The patient has no hx of bleeding problems and has no plans for upcoming surgical procedures. The sheath was upsized to a 6 Fr. He was loaded with Plavix 600 mg. Weight-based bivalirudin was given for anticoagulation. Once a therapeutic ACT was achieved, a 6 [place] JR4 guide catheter was inserted. A cougar coronary guidewire was used to cross the lesion. The lesion was predilated with a 2.5 mm balloon. The lesion was then stented with a 3.25x18 mm Xience Alpine DES stent. The stent was  postdilated with a 3.5 mm noncompliant balloon. Following PCI, there was 0% residual stenosis and TIMI-3 flow. Final angiography confirmed an excellent result. The patient tolerated the procedure well. There were no immediate procedural complications. A perclose device was used for hemostasis. The patient was transferred to the post catheterization recovery area for further monitoring.  Lesion Data:  Vessel: RCA/mid  Percent stenosis (pre): 99  TIMI-flow (pre): 3  Stent: 3.25x18 mm Xience  Percent stenosis (post): 0  TIMI-flow (post): 3  Estimated Blood Loss: minimal  Conclusions: Successful PCI of the RCA as above (DES)  Recommendations: ASA and plavix x 12 months. Anticipate home tomorrow.  Discharge Medications     Medication List         albuterol 108 (90 BASE) MCG/ACT inhaler  Commonly known as:  PROVENTIL HFA;VENTOLIN HFA  Inhale 2 puffs into the lungs every  6 (six) hours as needed for wheezing or shortness of breath.     aspirin 81 MG tablet  Take 81 mg by mouth daily.     brimonidine 0.1 % Soln  Commonly known as:  ALPHAGAN P  Place 1 drop into the right eye 2 (two) times daily.     clopidogrel 75 MG tablet  Commonly known as:  PLAVIX  Take 1 tablet (75 mg total) by mouth daily with breakfast.     dorzolamide-timolol 22.3-6.8 MG/ML ophthalmic solution  Commonly known as:  COSOPT  Place 1 drop into both eyes 2 (two) times daily.     Fish Oil 1000 MG Caps  Take by mouth daily.     HYDROcodone-acetaminophen 5-325 MG per tablet  Commonly known as:  NORCO/VICODIN  Take 0.5 tablets by mouth every 6 (six) hours as needed for moderate pain. For pain     LORazepam 0.5 MG tablet  Commonly known as:  ATIVAN  Take 0.5 mg by mouth daily as needed. For anxiety     nicotine 14 mg/24hr patch  Commonly known as:  NICODERM CQ  Place 1 patch (14 mg total) onto the skin daily.     nitroGLYCERIN 0.4 MG SL tablet  Commonly known as:  NITROSTAT  Place 1 tablet (0.4 mg total)  under the tongue every 5 (five) minutes as needed for chest pain.     rosuvastatin 20 MG tablet  Commonly known as:  CRESTOR  Take 20 mg by mouth every evening.     tiotropium 18 MCG inhalation capsule  Commonly known as:  SPIRIVA  Place 18 mcg into inhaler and inhale every morning.     traZODone 150 MG tablet  Commonly known as:  DESYREL  Take 75-150 mg by mouth at bedtime. Starts with half a tab if that doesn't work he takes the other half        Disposition   The patient will be discharged in stable condition to home. Discharge Instructions   Amb Referral to Cardiac Rehabilitation    Complete by:  As directed           Follow-up Information   Follow up with [name], MD. (The office will call you to make an appoinment. If you do not hear from them, please contact them.  You should be seen within 1-2 weeks.)    Specialty:  Cardiology   Contact information:   [address] [address] [phone]         Duration of Discharge Encounter: Greater than 30 minutes including physician and PA time.  Signed, THOMPSON, KATHRYN R PA-C 01/10/2014, 10:20 AM

## 2014-01-10 NOTE — Progress Notes (Addendum)
CARDIAC REHAB PHASE I   PRE:  Rate/Rhythm: 72 sinus rhythm  BP:  Supine:   Sitting: 159/82  Standing:    SaO2: 98% ra  MODE:  Ambulation: 500  ft   POST:  Rate/Rhythem: 78 normal sinus rhythm  BP:  Supine:   Sitting: 178/76 automatic, 154/84 manual recheck  Katie, PA made aware   SaO2: 99% ra 0950-1020 Pt ambulated in hallway x1 assist without difficulty, asymptomatic.  Pt returned to bed, call light in reach. Pt education completed including stents, medication compliance, NTG use and when to call 911, diet and exercise. Pt educated on outpatient cardiac rehab. At pt request, referral will be sent to [facility].  Smoking cessation counseling provided.  Pt instructed to call [phone].  Pt seems committed  to quitting.  Pt offered emotional support and reassurance.  Understanding verbalized  Connor Cuevas

## 2014-01-10 NOTE — Discharge Summary (Signed)
Please see rounding note as well.

## 2014-01-10 NOTE — Discharge Instructions (Signed)

## 2014-01-10 NOTE — Progress Notes (Signed)
    Primary cardiologist: Dr. [name]  Subjective:   No chest pain or shortness of breath, up walking in room without difficulty.   Objective:   Temp:  [97.8 F (36.6 C)-98.6 F (37 C)] 97.8 F (36.6 C) (10/10 0437) Pulse Rate:  [61-74] 67 (10/10 0437) Resp:  [16-20] 18 (10/10 0437) BP: (117-147)/(62-86) 125/68 mmHg (10/10 0437) SpO2:  [96 %-99 %] 96 % (10/10 0437) Weight:  [151 lb 14.4 oz (68.9 kg)] 151 lb 14.4 oz (68.9 kg) (10/10 0006) Last BM Date: 01/08/14  Filed Weights   01/09/14 0145 01/09/14 0350 01/10/14 0006  Weight: 155 lb (70.308 kg) 151 lb 10.8 oz (68.8 kg) 151 lb 14.4 oz (68.9 kg)    Intake/Output Summary (Last 24 hours) at 01/10/14 0740 Last data filed at 01/09/14 2330  Gross per 24 hour  Intake 1166.7 ml  Output    500 ml  Net  666.7 ml    Telemetry: Sinus rhythm.  Exam:  General: Appears comfortable.  Lungs: Clear, nonlabored.  Cardiac: RRR, no gallop.  Extremities: Stable catheterization sites right radial and right femoral.   Lab Results:  Basic Metabolic Panel:  Recent Labs Lab 01/08/14 2153 01/09/14 0525 01/10/14 0258  NA 141 141 141  K 4.0 4.0 4.5  CL 104 105 105  CO2 23 21 21   GLUCOSE 184* 161* 89  BUN 19 20 18   CREATININE 0.81 0.82 0.79  CALCIUM 9.1 8.9 9.2    Liver Function Tests:  Recent Labs Lab 01/09/14 0525  AST 15  ALT 10  ALKPHOS 55  BILITOT 0.4  PROT 6.3  ALBUMIN 3.3*    CBC:  Recent Labs Lab 01/08/14 2153 01/09/14 0525 01/10/14 0258  WBC 9.1 10.3 10.2  HGB 15.0 14.6 14.7  HCT 44.6 44.7 44.8  MCV 89.0 89.9 90.0  PLT 252 267 253    Cardiac Enzymes:  Recent Labs Lab 01/08/14 2153 01/09/14 0525 01/09/14 0915  TROPONINI <0.30 <0.30 <0.30    ECG: Normal sinus rhythm and nonspecific ST changes.   Medications:   Scheduled Medications: . aspirin  81 mg Oral Daily  . brimonidine  1 drop Both Eyes TID  . clopidogrel  75 mg Oral Q breakfast  . dorzolamide-timolol  1 drop Both Eyes  BID  . Influenza vac split quadrivalent PF  0.5 mL Intramuscular Tomorrow-1000  . rosuvastatin  20 mg Oral QPM  . tiotropium  18 mcg Inhalation q morning - 10a  . traZODone  75 mg Oral QHS      PRN Medications:  acetaminophen, albuterol, LORazepam, morphine injection, nitroGLYCERIN, ondansetron (ZOFRAN) IV, traZODone   Assessment:   1. Newly documented CAD, 99% mid RCA stenosis now status post DES by Dr. Burt Knack on October 9. LVEF 55-60% by echocardiogram.  2. Unstable angina. POC troponin I minimally abnormal, but lab troponin I levels normal.  3. COPD.  4. Hyperlipidemia, currently on Crestor.   Plan/Discussion:    Patient ready for discharge home today. Continue current outpatient regimen, Plavix has been added. Anticipate DAPT for one year. He should have followup in the next few weeks with Dr. [name] or associate.   Satira Sark, M.D., F.A.C.C.

## 2014-01-12 MED FILL — Sodium Chloride IV Soln 0.9%: INTRAVENOUS | Qty: 50 | Status: AC

## 2014-01-19 ENCOUNTER — Encounter: Payer: Self-pay | Admitting: Physician Assistant

## 2014-01-19 ENCOUNTER — Ambulatory Visit (INDEPENDENT_AMBULATORY_CARE_PROVIDER_SITE_OTHER): Payer: Medicare Other | Admitting: Physician Assistant

## 2014-01-19 VITALS — BP 120/80 | HR 77 | Ht 69.5 in | Wt 153.0 lb

## 2014-01-19 DIAGNOSIS — R0989 Other specified symptoms and signs involving the circulatory and respiratory systems: Secondary | ICD-10-CM

## 2014-01-19 DIAGNOSIS — I2510 Atherosclerotic heart disease of native coronary artery without angina pectoris: Secondary | ICD-10-CM

## 2014-01-19 DIAGNOSIS — I714 Abdominal aortic aneurysm, without rupture, unspecified: Secondary | ICD-10-CM

## 2014-01-19 DIAGNOSIS — E785 Hyperlipidemia, unspecified: Secondary | ICD-10-CM

## 2014-01-19 DIAGNOSIS — R911 Solitary pulmonary nodule: Secondary | ICD-10-CM

## 2014-01-19 DIAGNOSIS — Z720 Tobacco use: Secondary | ICD-10-CM

## 2014-01-19 DIAGNOSIS — J449 Chronic obstructive pulmonary disease, unspecified: Secondary | ICD-10-CM

## 2014-01-19 DIAGNOSIS — I739 Peripheral vascular disease, unspecified: Secondary | ICD-10-CM

## 2014-01-19 NOTE — Progress Notes (Signed)
Cardiology Office Note   Date:  01/19/2014   ID:  Connor Cuevas, DOB 1941-01-15, MRN [id number]  PCP:  Connor Kroner, MD  Cardiologist:  Dr. [name]      History of Present Illness: Connor Cuevas is a 73 y.o. male with a history of small AAA, COPD, RUL pulmonary nodule, HL, tobacco abuse. He was admitted 10/8-10/10 with chest pain consistent with unstable angina. He had one spurious elevated POC troponin. The rest of his cardiac markers were normal. 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 This was treated with a DES. EF was normal by echocardiogram.  He returns for FU.  He is doing well since discharge from the hospital. He denies chest pain, significant dyspnea, orthopnea, PND or edema. He has noted some fatigue. This seems to be slowly improving.   Studies:  - LHC (10/15):  Mid RCA 99% >> PCI: 3.25x18 mm Xience DES to the mid RCA  - Echo (10/15):  EF 55-60%, normal wall motion, lipomatous hypertrophy  - US Aorta (5/14):   Mild aneurysmal dilatation of the aorta. Previously, max  diameter in the transverse dimension 3.3 cm. This is stable today. Rec FU [place] in 3 years.    Recent Labs/Images:  Recent Labs  01/09/14 0525 01/10/14 0258  NA 141 141  K 4.0 4.5  BUN 20 18  CREATININE 0.82 0.79  ALT 10  --   HGB 14.6 14.7     Dg Chest Portable 1 View  01/08/2014    IMPRESSION: 1. No acute cardiopulmonary disease or significant interval change. 2. Chronic interstitial coarsening. 3. Right upper lobe pulmonary nodule.   Electronically Signed   By: Connor Cuevas M.D.   On: 01/08/2014 22:52     Wt Readings from Last 3 Encounters:  01/10/14 151 lb 14.4 oz (68.9 kg)  01/10/14 151 lb 14.4 oz (68.9 kg)  10/10/11 150 lb (68.04 kg)     Past Medical History  Diagnosis Date  . Anxiety   . Hyperlipidemia   . Stenosis of iliac artery     a. Aortosclerosis, bilateral - mild with calcification. Per Dr. Moreen Fowler at [facility]   . Interstitial  lung disease   . Pulmonary nodule, right     a. Right upper lobe  . Right ureteral stone   . History of kidney stones   . AAA (abdominal aortic aneurysm)     a. 3.0 CM  PER CT 08-21-2011  . Glaucoma of both eyes   . Arthritis   . CAD (coronary artery disease)     a. 01/09/14: [place] s/p DES to RCA   . COPD (chronic obstructive pulmonary disease)   . Tobacco abuse     Current Outpatient Prescriptions  Medication Sig Dispense Refill  . albuterol (PROVENTIL HFA;VENTOLIN HFA) 108 (90 BASE) MCG/ACT inhaler Inhale 2 puffs into the lungs every 6 (six) hours as needed for wheezing or shortness of breath.      Marland Kitchen aspirin 81 MG tablet Take 81 mg by mouth daily.       . brimonidine (ALPHAGAN P) 0.1 % SOLN Place 1 drop into the right eye 2 (two) times daily.      . clopidogrel (PLAVIX) 75 MG tablet Take 1 tablet (75 mg total) by mouth daily with breakfast.  30 tablet  11  . dorzolamide-timolol (COSOPT) 22.3-6.8 MG/ML ophthalmic solution Place 1 drop into both eyes 2 (two) times daily.      Marland Kitchen HYDROcodone-acetaminophen ([place])  5-325 MG per tablet Take 0.5 tablets by mouth every 6 (six) hours as needed for moderate pain. For pain      . LORazepam (ATIVAN) 0.5 MG tablet Take 0.5 mg by mouth daily as needed. For anxiety      . nicotine (NICODERM CQ) 14 mg/24hr patch Place 1 patch (14 mg total) onto the skin daily.  28 patch  12  . nitroGLYCERIN (NITROSTAT) 0.4 MG SL tablet Place 1 tablet (0.4 mg total) under the tongue every 5 (five) minutes as needed for chest pain.  25 tablet  12  . Omega-3 Fatty Acids (FISH OIL) 1000 MG CAPS Take by mouth daily.       . rosuvastatin (CRESTOR) 20 MG tablet Take 20 mg by mouth every evening.       . tiotropium (SPIRIVA) 18 MCG inhalation capsule Place 18 mcg into inhaler and inhale every morning.       . traZODone (DESYREL) 150 MG tablet Take 75-150 mg by mouth at bedtime. Starts with half a tab if that doesn't work he takes the other half       No current  facility-administered medications for this visit.     Allergies:   Macrobid   Social History:  The patient  reports that he has been smoking Cigarettes.  He has a 53 pack-year smoking history. He has never used smokeless tobacco. He reports that he does not drink alcohol or use illicit drugs.   Family History:  The patient's family history includes Heart attack in his other; Hypertension in his mother; Stroke in his other.   ROS:  Please see the history of present illness.   He notes bilateral calf pain with exertion that improves with rest (right greater than left).   All other systems reviewed and negative.    PHYSICAL EXAM: VS:  BP 120/80  Pulse 77  Ht 5' 9.5" (1.765 m)  Wt 153 lb (69.4 kg)  BMI 22.28 kg/m2 Well nourished, well developed, in no acute distress HEENT: normal Neck:  no JVD Cardiac:  normal S1, S2;  RRR; no murmur Lungs:  Decreased breath sounds bilaterally, no wheezing, rhonchi or rales Abd: soft, nontender, no hepatomegaly Ext:  RFA site without pulsatile mass; no edema Vascular: Bilateral femoral artery bruits noted (right greater than left) Skin: warm and dry Neuro:  CNs 2-12 intact, no focal abnormalities noted  EKG:  NSR, HR 77, normal axis, septal Q waves, nonspecific ST-T wave changes      ASSESSMENT AND PLAN:  1.  Coronary artery disease: He is doing well after recent admission to the hospital with unstable angina and PCI with a DES to the RCA. He understands the importance of dual antiplatelet therapy. Continue aspirin, Plavix, statin. He's not interested in formal cardiac rehabilitation. He'll increase his activity on his own. 2.  Hyperlipidemia:  Continue statin. This is managed by primary care. 3.  Chronic obstructive pulmonary disease:  Continue Spiriva. 4.  Tobacco abuse:  He has quit smoking. 5.  Pulmonary nodule:  This is followed by primary care. 6.  AAA (abdominal aortic aneurysm):  This is followed by primary care. Followup ultrasound due in  2017. 7.  Claudication of calf muscles:  Patient has evidence of PAD on exam. He does complain of right greater than left calf pain with exertion. I will obtain Lower Extremity Arterial Doppler Bilateral   Disposition:   FU with Dr. [name] two-months.   Signed, Richardson Dopp, PA-C, MHS 01/19/2014 2:08 PM  [facility] [place], [place],   [address] Phone: [phone]; Fax: [phone]

## 2014-01-19 NOTE — Patient Instructions (Signed)
Your physician recommends that you continue on your current medications as directed. Please refer to the Current Medication list given to you today.  YOU WILL NEED TO SCHEDULE FOR A LOWER EXTREMITY ARTERIAL DOPPLER WITH ABI'S   Your physician recommends that you schedule a follow-up appointment in: [place] DR. [name]

## 2014-01-27 ENCOUNTER — Ambulatory Visit (HOSPITAL_COMMUNITY): Payer: Medicare Other | Attending: Internal Medicine | Admitting: *Deleted

## 2014-01-27 DIAGNOSIS — I739 Peripheral vascular disease, unspecified: Secondary | ICD-10-CM

## 2014-01-27 DIAGNOSIS — Z87891 Personal history of nicotine dependence: Secondary | ICD-10-CM | POA: Insufficient documentation

## 2014-01-27 DIAGNOSIS — R0989 Other specified symptoms and signs involving the circulatory and respiratory systems: Secondary | ICD-10-CM | POA: Diagnosis present

## 2014-01-27 DIAGNOSIS — J449 Chronic obstructive pulmonary disease, unspecified: Secondary | ICD-10-CM | POA: Insufficient documentation

## 2014-01-27 DIAGNOSIS — I2510 Atherosclerotic heart disease of native coronary artery without angina pectoris: Secondary | ICD-10-CM | POA: Insufficient documentation

## 2014-01-27 DIAGNOSIS — E785 Hyperlipidemia, unspecified: Secondary | ICD-10-CM | POA: Insufficient documentation

## 2014-01-27 NOTE — Progress Notes (Signed)
Arterial Doppler Lower Performed

## 2014-01-28 ENCOUNTER — Encounter: Payer: Self-pay | Admitting: Physician Assistant

## 2014-02-02 ENCOUNTER — Telehealth: Payer: Self-pay | Admitting: Cardiovascular Disease

## 2014-02-02 NOTE — Telephone Encounter (Signed)
New message     Pt had stent put in 01-09-14.  He has a loose tooth.  He want to know what precautions he need to take if the dentist needs to pull it.  Please advise

## 2014-02-02 NOTE — Telephone Encounter (Signed)
May need to pack tooth to minimize bleeding / oozing but cannot stop plavix

## 2014-02-02 NOTE — Telephone Encounter (Signed)
WILL FORWARD  MESSAGE TO DR [name]  FOR REVIEW  NOT  SURE  IF  DENTIST  WILLING  TO PULL WHILE  ON PLAVIX   NEEDS  TO BE ON PLAVIX  FOR AT LEAST  A  YEAR PER  GUIDELINES  .Connor Cuevas

## 2014-02-03 NOTE — Telephone Encounter (Signed)
LMTCB ./CY 

## 2014-02-04 NOTE — Telephone Encounter (Signed)
Pt was informed to continue Plavix for at least one year, pt verbalized understanding.

## 2014-02-04 NOTE — Telephone Encounter (Signed)
Follow up  ° ° ° °Returning call back to nurse  °

## 2014-03-12 ENCOUNTER — Encounter (HOSPITAL_COMMUNITY): Payer: Self-pay | Admitting: Cardiovascular Disease

## 2014-03-16 NOTE — Progress Notes (Signed)
Patient ID: Connor Cuevas, male   DOB: April 05, 1940, 73 y.o.   MRN: [id number] Connor Cuevas is a 73 y.o. male with a history of small AAA, COPD, RUL pulmonary nodule, HL, tobacco abuse. He was admitted 10/8-10/10 with chest pain consistent with unstable angina. He had one spurious elevated POC troponin. The rest of his cardiac markers were normal. 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 This was treated with a DES. EF was normal by echocardiogram. He returns for FU. He is doing well since discharge from the hospital. He denies chest pain, significant dyspnea, orthopnea, PND or edema. He has noted some fatigue. This seems to be slowly improving.   Studies: - LHC (10/15): Mid RCA 99% >> PCI: 3.25x18 mm Xience DES to the mid RCA - Echo (10/15): EF 55-60%, normal wall motion, lipomatous hypertrophy - US Aorta (5/14): Mild aneurysmal dilatation of the aorta. Previously, max  diameter in the transverse dimension 3.3 cm. This is stable today. Rec FU [place] in 3 years.    Recent Labs/Images:  Recent Labs (within last 365 days)     Recent Labs  01/09/14 0525 01/10/14 0258  NA 141 141  K 4.0 4.5  BUN 20 18  CREATININE 0.82 0.79  ALT 10 --   HGB 14.6 14.7       Dg Chest Portable 1 View 01/08/2014 IMPRESSION: 1. No acute cardiopulmonary disease or significant interval change. 2. Chronic interstitial coarsening. 3. Right upper lobe pulmonary nodule. Electronically Signed By: Lawrence Santiago M.D. On: 01/08/2014 22:52   F/U ABI's 10/15 reviewed and normal  ? Pain in legs from statin    ROS: Denies fever, malais, weight loss, blurry vision, decreased visual acuity, cough, sputum, SOB, hemoptysis, pleuritic pain, palpitaitons, heartburn, abdominal pain, melena, lower extremity edema, claudication, or rash.  All other systems reviewed and negative  General: Affect appropriate Healthy:  appears stated age 77: normal Neck  supple with no adenopathy JVP normal no bruits no thyromegaly Lungs clear with no wheezing and good diaphragmatic motion Heart:  S1/S2 no murmur, no rub, gallop or click PMI normal Abdomen: benighn, BS positve, no tenderness, no AAA no bruit.  No HSM or HJR Distal pulses intact with no bruits No edema Neuro non-focal Skin warm and dry No muscular weakness   Current Outpatient Prescriptions  Medication Sig Dispense Refill  . aspirin 81 MG tablet Take 81 mg by mouth daily.     . brimonidine (ALPHAGAN P) 0.1 % SOLN Place 1 drop into the right eye 2 (two) times daily.    . clopidogrel (PLAVIX) 75 MG tablet Take 1 tablet (75 mg total) by mouth daily with breakfast. 30 tablet 11  . Coenzyme Q10 (CO Q 10) 100 MG CAPS Take 100 mg by mouth daily.    . dorzolamide-timolol (COSOPT) 22.3-6.8 MG/ML ophthalmic solution Place 1 drop into both eyes 2 (two) times daily.    Marland Kitchen HYDROcodone-acetaminophen (NORCO) 5-325 MG per tablet Take 0.5 tablets by mouth every 6 (six) hours as needed for moderate pain. For pain    . LORazepam (ATIVAN) 0.5 MG tablet Take 0.5 mg by mouth daily as needed. For anxiety    . Multiple Vitamins-Minerals (ICAPS AREDS FORMULA PO) Take 1 tablet by mouth daily.    . nicotine (NICODERM CQ) 14 mg/24hr patch Place 1 patch (14 mg total) onto the skin daily. 28 patch 12  . nitroGLYCERIN (NITROSTAT) 0.4 MG SL tablet Place 1 tablet (0.4 mg total) under the tongue  every 5 (five) minutes as needed for chest pain. 25 tablet 12  . OMEGA-3 KRILL OIL PO Take 200 mg by mouth daily.    . rosuvastatin (CRESTOR) 20 MG tablet Take 20 mg by mouth every evening.     . tiotropium (SPIRIVA) 18 MCG inhalation capsule Place 18 mcg into inhaler and inhale every morning.     . traZODone (DESYREL) 150 MG tablet Take 75-150 mg by mouth at bedtime. Starts with half a tab if that doesn't work he takes the other half     No current facility-administered medications for this visit.     Allergies  Macrobid  Electrocardiogram:  10/19  SR rate 73  Septal infarct LAE otherwise normal   Assessment and Plan

## 2014-03-17 ENCOUNTER — Encounter: Payer: Self-pay | Admitting: Cardiovascular Disease

## 2014-03-17 ENCOUNTER — Ambulatory Visit (INDEPENDENT_AMBULATORY_CARE_PROVIDER_SITE_OTHER): Payer: Medicare Other | Admitting: Cardiovascular Disease

## 2014-03-17 VITALS — BP 134/72 | HR 92 | Ht 69.0 in | Wt 150.4 lb

## 2014-03-17 DIAGNOSIS — E785 Hyperlipidemia, unspecified: Secondary | ICD-10-CM

## 2014-03-17 MED ORDER — ATENOLOL 25 MG PO TABS: 25.0000 mg | ORAL_TABLET | Freq: Every day | ORAL | Status: DC

## 2014-03-17 NOTE — Assessment & Plan Note (Signed)
Stop crestor and see if leg pains improve ABI's normal if they do can try lipitor If he fails multiple statins Due to myalgias will refer to lipid clinic for PSK 9

## 2014-03-17 NOTE — Assessment & Plan Note (Signed)
SEM no change S2 preserved  F/u echo as needed

## 2014-03-17 NOTE — Assessment & Plan Note (Signed)
Stable with no angina and good activity level.  Continue medical Rx Will start atenolol 25 mg given relatively high HR and BP  Wife will check vitals at home

## 2014-03-17 NOTE — Patient Instructions (Addendum)
Your physician recommends that you schedule a follow-up appointment in:   [place] has recommended you make the following change in your medication:  START ATENOLOL 25 MG  EVERY DAY AND   STOP  CRESTOR FOR  1 MONTH SEE IF  PAIN  DISAPPEARS

## 2014-05-22 ENCOUNTER — Encounter: Payer: Self-pay | Admitting: Cardiovascular Disease

## 2014-06-01 NOTE — Progress Notes (Signed)
Patient ID: Connor Cuevas, male   DOB: November 03, 1940, 74 y.o.   MRN: [id number] Connor Cuevas is a 74 y.o. male with a history of small AAA, COPD, RUL pulmonary nodule, HL, tobacco abuse. He was admitted 10/8-10/10/15  with chest pain consistent with unstable angina. He had one spurious elevated POC troponin. The rest of his cardiac markers were normal. 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 This was treated with a DES. EF was normal by echocardiogram. He returns for FU. He is doing well since discharge from the hospital. He denies chest pain, significant dyspnea, orthopnea, PND or edema. He has noted some fatigue. This seems to be slowly improving.  Lipids elevated 2/18  TC 247  And LDL 174  Started back on lipitor by primary   Studies: - LHC (10/15): Mid RCA 99% >> PCI: 3.25x18 mm Xience DES to the mid RCA - Echo (10/15): EF 55-60%, normal wall motion, lipomatous hypertrophy - US Aorta (5/14): Mild aneurysmal dilatation of the aorta. Previously, max  diameter in the transverse dimension 3.3 cm. This is stable today. Rec FU [place] in 3 years.    Recent Labs/Images:  Recent Labs (within last 365 days)     Recent Labs  01/09/14 0525 01/10/14 0258  NA 141 141  K 4.0 4.5  BUN 20 18  CREATININE 0.82 0.79  ALT 10 --   HGB 14.6 14.7       Dg Chest Portable 1 View 01/08/2014 IMPRESSION: 1. No acute cardiopulmonary disease or significant interval change. 2. Chronic interstitial coarsening. 3. Right upper lobe pulmonary nodule. Electronically Signed By: Lawrence Santiago M.D. On: 01/08/2014 22:52   F/U ABI's 10/15 reviewed and normal  ? Pain in legs from statin     ROS: Denies fever, malais, weight loss, blurry vision, decreased visual acuity, cough, sputum, SOB, hemoptysis, pleuritic pain, palpitaitons, heartburn, abdominal pain, melena, lower extremity edema, claudication, or rash.  All other systems reviewed and  negative  General: Affect appropriate Healthy:  appears stated age 40: normal Neck supple with no adenopathy JVP normal no bruits no thyromegaly Lungs clear with no wheezing and good diaphragmatic motion Heart:  S1/S2 no murmur, no rub, gallop or click PMI normal Abdomen: benighn, BS positve, no tenderness, no AAA no bruit.  No HSM or HJR Distal pulses intact with no bruits No edema Neuro non-focal Skin warm and dry No muscular weakness   Current Outpatient Prescriptions  Medication Sig Dispense Refill  . aspirin 81 MG tablet Take 81 mg by mouth daily.     Marland Kitchen atenolol (TENORMIN) 25 MG tablet Take 1 tablet (25 mg total) by mouth daily. 30 tablet 11  . brimonidine (ALPHAGAN P) 0.1 % SOLN Place 1 drop into the right eye 2 (two) times daily.    . clopidogrel (PLAVIX) 75 MG tablet Take 1 tablet (75 mg total) by mouth daily with breakfast. 30 tablet 11  . Coenzyme Q10 (CO Q 10) 100 MG CAPS Take 100 mg by mouth daily.    . dorzolamide-timolol (COSOPT) 22.3-6.8 MG/ML ophthalmic solution Place 1 drop into both eyes 2 (two) times daily.    Marland Kitchen HYDROcodone-acetaminophen (NORCO) 5-325 MG per tablet Take 0.5 tablets by mouth every 6 (six) hours as needed for moderate pain. For pain    . LORazepam (ATIVAN) 0.5 MG tablet Take 0.5 mg by mouth daily as needed. For anxiety    . Multiple Vitamins-Minerals (ICAPS AREDS FORMULA PO) Take 1 tablet by mouth daily.    Marland Kitchen  nitroGLYCERIN (NITROSTAT) 0.4 MG SL tablet Place 1 tablet (0.4 mg total) under the tongue every 5 (five) minutes as needed for chest pain. 25 tablet 12  . OMEGA-3 KRILL OIL PO Take 200 mg by mouth daily.    Marland Kitchen tiotropium (SPIRIVA) 18 MCG inhalation capsule Place 18 mcg into inhaler and inhale every morning.     . traZODone (DESYREL) 150 MG tablet Take 75-150 mg by mouth at bedtime. Starts with half a tab if that doesn't work he takes the other half     No current facility-administered medications for this visit.     Allergies  Macrobid  Electrocardiogram:  10/19  SR rate 73  Septal infarct LAE otherwise normal   Assessment and Plan

## 2014-06-02 ENCOUNTER — Encounter: Payer: Self-pay | Admitting: Cardiovascular Disease

## 2014-06-02 ENCOUNTER — Ambulatory Visit (INDEPENDENT_AMBULATORY_CARE_PROVIDER_SITE_OTHER): Payer: Medicare Other | Admitting: Cardiovascular Disease

## 2014-06-02 VITALS — BP 102/60 | HR 64 | Ht 69.5 in | Wt 150.0 lb

## 2014-06-02 DIAGNOSIS — I2510 Atherosclerotic heart disease of native coronary artery without angina pectoris: Secondary | ICD-10-CM

## 2014-06-02 DIAGNOSIS — E785 Hyperlipidemia, unspecified: Secondary | ICD-10-CM

## 2014-06-02 DIAGNOSIS — I714 Abdominal aortic aneurysm, without rupture, unspecified: Secondary | ICD-10-CM

## 2014-06-02 DIAGNOSIS — I2583 Coronary atherosclerosis due to lipid rich plaque: Principal | ICD-10-CM

## 2014-06-02 NOTE — Patient Instructions (Signed)
Your physician wants you to follow-up in: YEAR WITH DR NISHAN  You will receive a reminder letter in the mail two months in advance. If you don't receive a letter, please call our office to schedule the follow-up appointment.  Your physician recommends that you continue on your current medications as directed. Please refer to the Current Medication list given to you today. 

## 2014-06-02 NOTE — Assessment & Plan Note (Signed)
Stable with no angina and good activity level.  Continue medical Rx Carries nitro with him

## 2014-06-02 NOTE — Assessment & Plan Note (Signed)
Started on lipitor  F/u labs with primary 3 months

## 2014-06-02 NOTE — Assessment & Plan Note (Signed)
<  3.0 cm  Consider f/u duplex in a year no palpable mass on exam

## 2015-01-08 ENCOUNTER — Other Ambulatory Visit: Payer: Self-pay | Admitting: Physician Assistant

## 2015-04-04 HISTORY — PX: CARDIAC CATHETERIZATION: SHX172

## 2015-04-04 HISTORY — PX: COLONOSCOPY: SHX174

## 2015-05-30 NOTE — Progress Notes (Signed)
Patient ID: MCKADE SOOS, male   DOB: 12/24/1940, 75 y.o.   MRN: [id number] CAYNAN BROSIUS is a 75 y.o.  male with a history of small AAA, COPD, RUL pulmonary nodule, HL, tobacco abuse. He was admitted 10/8-10/10/15  with chest pain consistent with unstable angina. He had one spurious elevated POC troponin. The rest of his cardiac markers were normal. 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 This was treated with a DES. EF was normal by echocardiogram. He returns for FU. He is doing well since discharge from the hospital. He denies chest pain, significant dyspnea, orthopnea, PND or edema. He has noted some fatigue. This seems to be slowly improving.  Lipids elevated 2/18  TC 247  And LDL 174  Started back on lipitor by primary   Studies: - LHC (10/15): Mid RCA 99% >> PCI: 3.25x18 mm Xience DES to the mid RCA - Echo (10/15): EF 55-60%, normal wall motion, lipomatous hypertrophy - US Aorta (5/14): Mild aneurysmal dilatation of the aorta. Previously, max  diameter in the transverse dimension 3.3 cm. This is stable today. Rec FU [place] in 3 years.    Recent Labs/Images:  Recent Labs (within last 365 days)     Recent Labs  01/09/14 0525 01/10/14 0258  NA 141 141  K 4.0 4.5  BUN 20 18  CREATININE 0.82 0.79  ALT 10 --   HGB 14.6 14.7       Dg Chest Portable 1 View 01/08/2014 IMPRESSION: 1. No acute cardiopulmonary disease or significant interval change. 2. Chronic interstitial coarsening. 3. Right upper lobe pulmonary nodule. Electronically Signed By: Lawrence Santiago M.D. On: 01/08/2014 22:52   F/U ABI's 10/15 reviewed and normal   Had brown recluse spider bite a week ago.  Bruising and pain in LLE Needs f/u cXR for lung nodule seen 2015    ROS: Denies fever, malais, weight loss, blurry vision, decreased visual acuity, cough, sputum, SOB, hemoptysis, pleuritic pain, palpitaitons, heartburn, abdominal pain, melena,  lower extremity edema, claudication, or rash.  All other systems reviewed and negative  General: Affect appropriate Healthy:  appears stated age 63: normal Neck supple with no adenopathy JVP normal no bruits no thyromegaly Lungs clear with no wheezing and good diaphragmatic motion Heart:  S1/S2 no murmur, no rub, gallop or click PMI normal Abdomen: benighn, BS positve, no tenderness, no AAA no bruit.  No HSM or HJR Distal pulses intact with no bruits No edema Neuro non-focal Skin warm and dry No muscular weakness   Current Outpatient Prescriptions  Medication Sig Dispense Refill  . aspirin 81 MG tablet Take 81 mg by mouth daily.     Marland Kitchen atenolol (TENORMIN) 25 MG tablet Take 1 tablet (25 mg total) by mouth daily. 90 tablet 3  . atorvastatin (LIPITOR) 40 MG tablet Take 40 mg by mouth daily.    . brimonidine (ALPHAGAN P) 0.1 % SOLN Place 1 drop into the right eye 2 (two) times daily.    . clopidogrel (PLAVIX) 75 MG tablet TAKE ONE TABLET BY MOUTH ONCE DAILY WITH  BREAKFAST 90 tablet 3  . Coenzyme Q10 (CO Q 10) 100 MG CAPS Take 100 mg by mouth daily.    . dorzolamide-timolol (COSOPT) 22.3-6.8 MG/ML ophthalmic solution Place 1 drop into both eyes 2 (two) times daily.    Marland Kitchen HYDROcodone-acetaminophen (NORCO) 5-325 MG per tablet Take 0.5 tablets by mouth every 6 (six) hours as needed for moderate pain. For pain    . LORazepam (  ATIVAN) 0.5 MG tablet Take 0.5 mg by mouth daily as needed. For anxiety    . Multiple Vitamins-Minerals (ICAPS AREDS FORMULA PO) Take 1 tablet by mouth daily.    . nitroGLYCERIN (NITROSTAT) 0.4 MG SL tablet Place 1 tablet (0.4 mg total) under the tongue every 5 (five) minutes as needed for chest pain. 25 tablet 12  . OMEGA-3 KRILL OIL PO Take 200 mg by mouth daily.    Marland Kitchen PROAIR HFA 108 (90 BASE) MCG/ACT inhaler Take 2 puffs by mouth daily as needed for wheezing or shortness of breath.     . tiotropium (SPIRIVA) 18 MCG inhalation capsule Place 18 mcg into inhaler and  inhale every morning.     . traZODone (DESYREL) 100 MG tablet Take 100 mg by mouth daily.     No current facility-administered medications for this visit.    Allergies  Macrobid  Electrocardiogram:  01/19/14   SR rate 73  Septal infarct LAE otherwise normal   Assessment and Plan CAD: Stable with no angina and good activity level.  Continue medical Rx AAA:  3.3 cm [place] 2014 f/u duplex no palpable mass on exam will do at our [place] office  Chol:  Cholesterol is at goal.  Continue current dose of statin and diet Rx.  No myalgias or side effects.  F/U  LFT's in 6 months. No results found for: LDLCALC          Spider Bite:  Improving no antibiotics per primary bruising resolving NSAI's for pain  COPD:  With RUL lung nodule F/U CXR today

## 2015-06-02 ENCOUNTER — Ambulatory Visit (INDEPENDENT_AMBULATORY_CARE_PROVIDER_SITE_OTHER): Payer: Medicare Other | Admitting: Cardiovascular Disease

## 2015-06-02 ENCOUNTER — Encounter: Payer: Self-pay | Admitting: Cardiovascular Disease

## 2015-06-02 ENCOUNTER — Other Ambulatory Visit: Payer: Self-pay | Admitting: Family Medicine

## 2015-06-02 VITALS — BP 122/70 | HR 100 | Ht 69.5 in | Wt 147.8 lb

## 2015-06-02 DIAGNOSIS — R911 Solitary pulmonary nodule: Secondary | ICD-10-CM | POA: Diagnosis not present

## 2015-06-02 DIAGNOSIS — I2510 Atherosclerotic heart disease of native coronary artery without angina pectoris: Secondary | ICD-10-CM | POA: Diagnosis not present

## 2015-06-02 DIAGNOSIS — I714 Abdominal aortic aneurysm, without rupture, unspecified: Secondary | ICD-10-CM

## 2015-06-02 DIAGNOSIS — I2583 Coronary atherosclerosis due to lipid rich plaque: Principal | ICD-10-CM

## 2015-06-02 DIAGNOSIS — I10 Essential (primary) hypertension: Secondary | ICD-10-CM

## 2015-06-02 MED ORDER — ATENOLOL 25 MG PO TABS: 25.0000 mg | ORAL_TABLET | Freq: Every day | ORAL | Status: DC

## 2015-06-02 MED ORDER — CLOPIDOGREL BISULFATE 75 MG PO TABS: ORAL_TABLET | ORAL | Status: DC

## 2015-06-02 NOTE — Patient Instructions (Addendum)
Medication Instructions:  Your physician recommends that you continue on your current medications as directed. Please refer to the Current Medication list given to you today.  Labwork: NONE  Testing/Procedures: A chest x-ray takes a picture of the organs and structures inside the chest, including the heart, lungs, and blood vessels. This test can show several things, including, whether the heart is enlarges; whether fluid is building up in the lungs; and whether pacemaker / defibrillator leads are still in place. AT the [address] [facility].  Your physician has requested that you have an abdominal aorta duplex. During this test, an ultrasound is used to evaluate the aorta. Allow 30 minutes for this exam. Do not eat after midnight the day before and avoid carbonated beverages  Follow-Up: Your physician wants you to follow-up in: 12 months with Dr. [name].  You will receive a reminder letter in the mail two months in advance. If you don't receive a letter, please call our office to schedule the follow-up appointment.  If you need a refill on your cardiac medications before your next appointment, please call your pharmacy.

## 2015-06-08 ENCOUNTER — Ambulatory Visit (INDEPENDENT_AMBULATORY_CARE_PROVIDER_SITE_OTHER)
Admission: RE | Admit: 2015-06-08 | Discharge: 2015-06-08 | Disposition: A | Discharge status: Home / Self Care | Payer: Medicare Other | Source: Ambulatory Visit | Attending: Cardiovascular Disease | Admitting: Cardiovascular Disease

## 2015-06-08 DIAGNOSIS — R911 Solitary pulmonary nodule: Secondary | ICD-10-CM | POA: Diagnosis not present

## 2015-06-08 IMAGING — DX DG CHEST 2V
2 series · 2 of 2 positions shown · non-contrast
Comparison: Chest x-ray of [DATE] and CT chest of [DATE]

CLINICAL DATA: History of right upper lobe lung nodule, followup,
former smoking history

EXAM:
CHEST  2 VIEW

[chest lat]
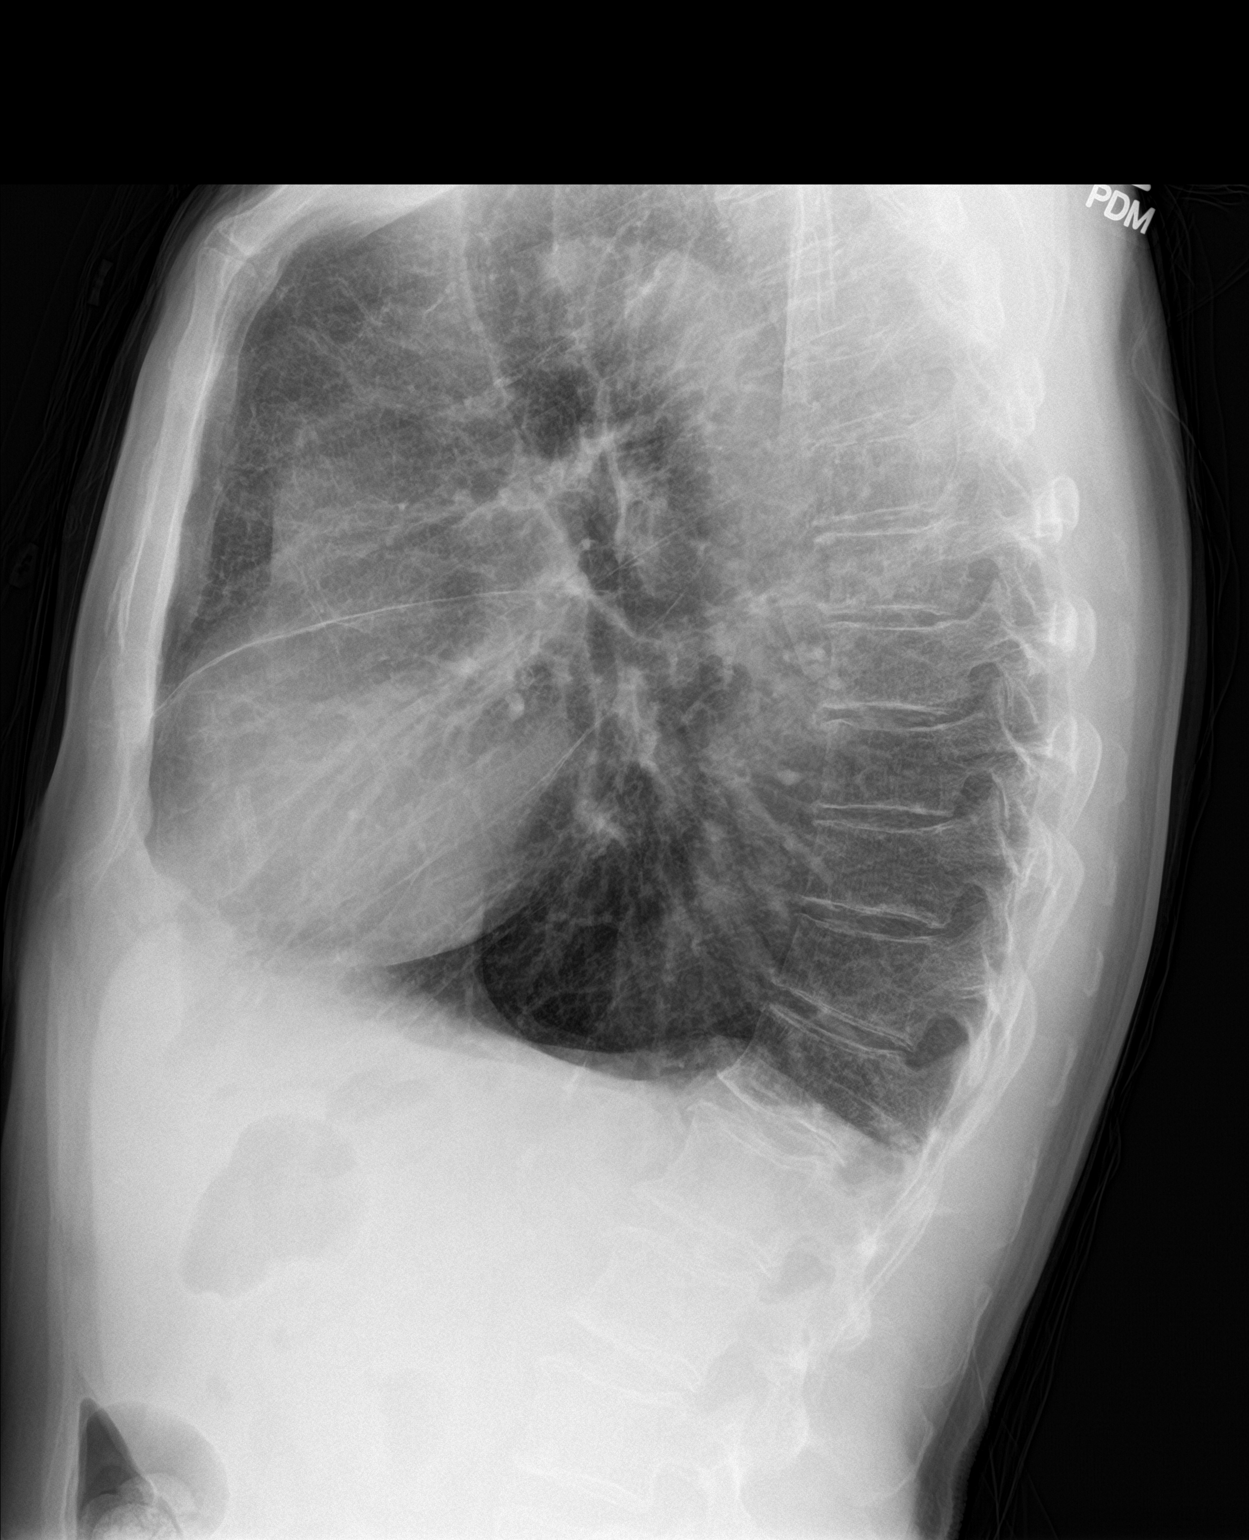

[chest pa]
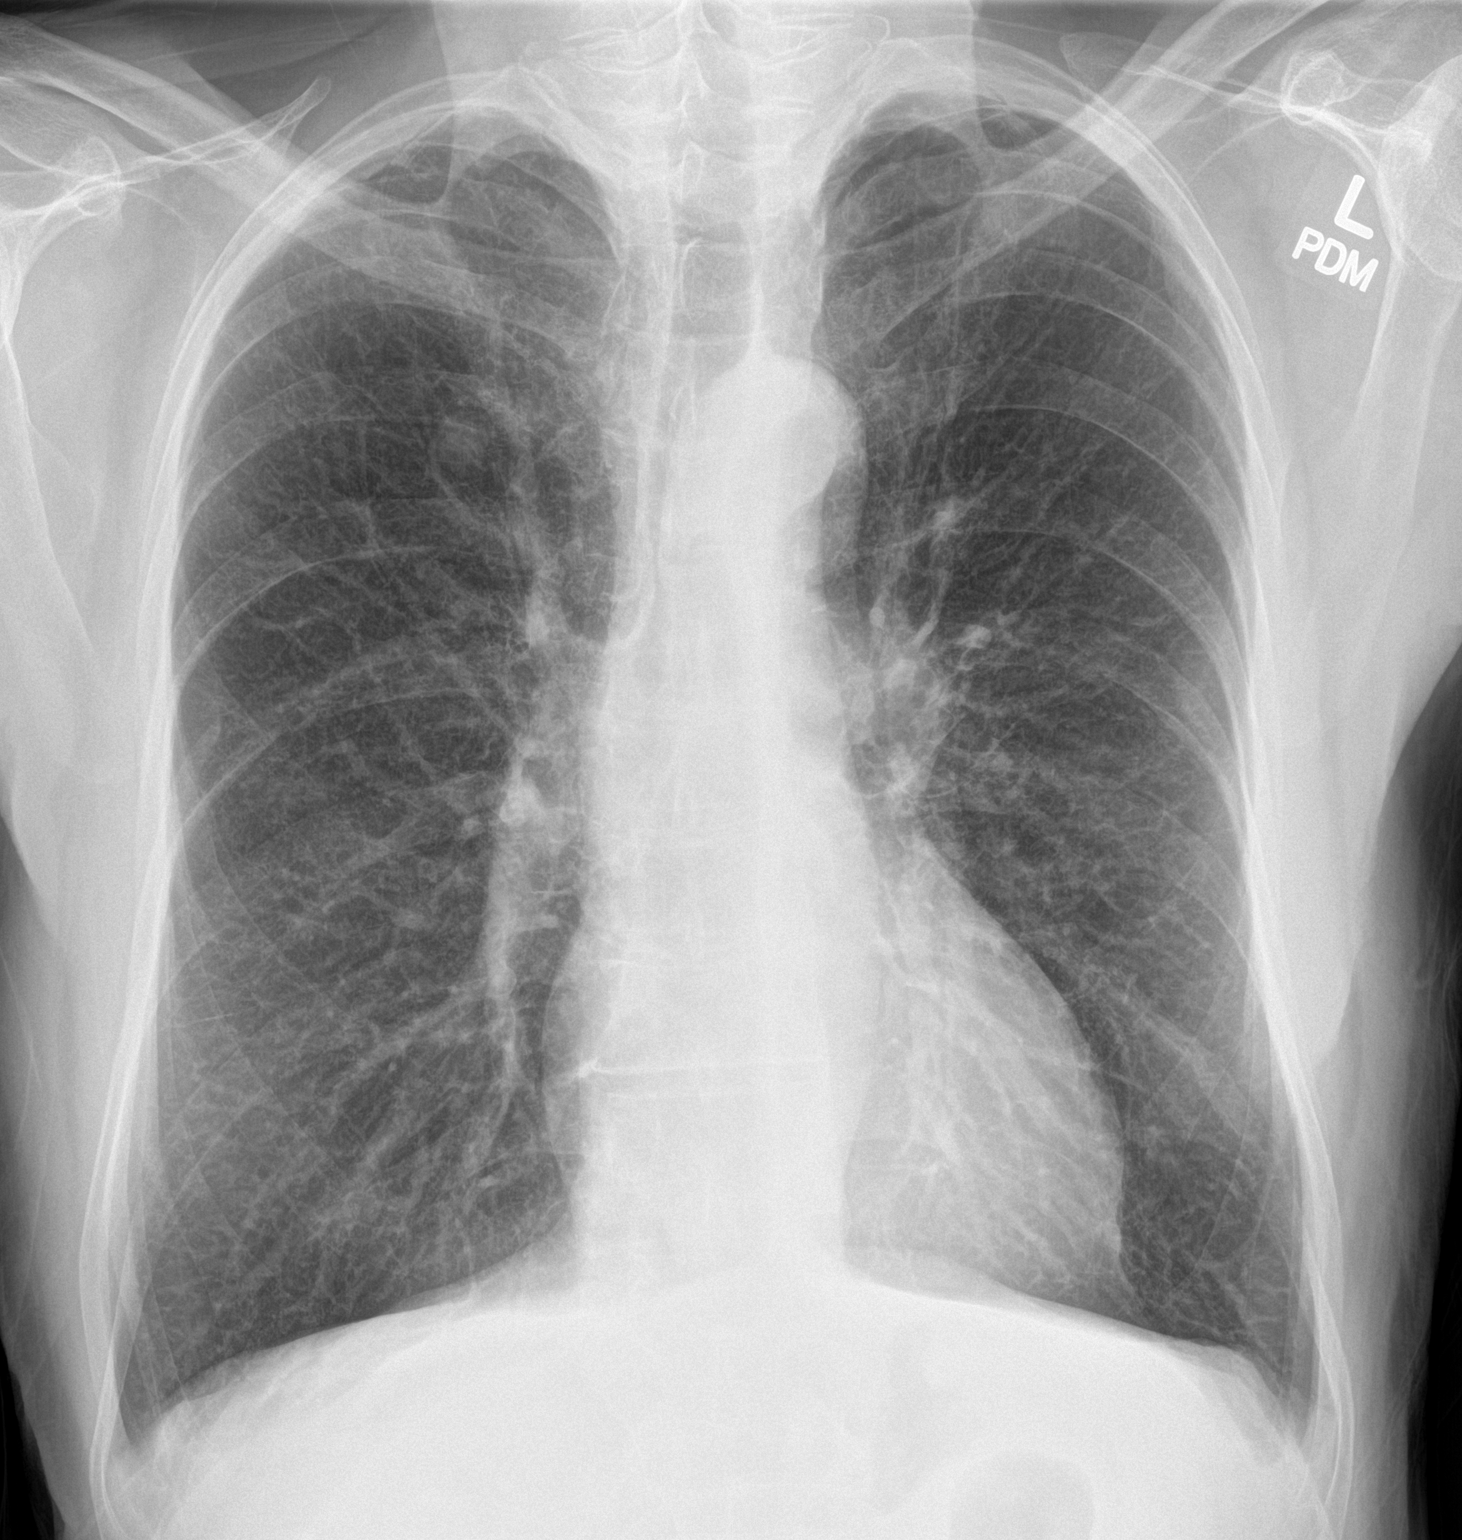

[2 of 2 positions shown; findings below may reference images not displayed]

FINDINGS: The previously noted nodule in the right upper lobe somewhat medial
in position has not changed in size a most likely is benign. The
lungs are hyperaerated with flattened hemidiaphragms and increased
AP diameter consistent with emphysema. No infiltrate or effusion is
seen. Mediastinal and hilar contours are unremarkable. The heart is
within normal limits in size. No acute bony abnormality is seen.
IMPRESSION: 1. Stable right upper lobe noncalcified lung nodule.
2. Emphysema.

## 2015-06-09 ENCOUNTER — Other Ambulatory Visit (HOSPITAL_COMMUNITY): Payer: Medicare Other

## 2015-06-10 ENCOUNTER — Ambulatory Visit (HOSPITAL_COMMUNITY)
Admission: RE | Admit: 2015-06-10 | Discharge: 2015-06-10 | Disposition: A | Discharge status: Home / Self Care | Payer: Medicare Other | Source: Ambulatory Visit | Attending: Cardiovascular Disease | Admitting: Cardiovascular Disease

## 2015-06-10 DIAGNOSIS — I708 Atherosclerosis of other arteries: Secondary | ICD-10-CM | POA: Insufficient documentation

## 2015-06-10 DIAGNOSIS — I714 Abdominal aortic aneurysm, without rupture, unspecified: Secondary | ICD-10-CM

## 2015-06-10 DIAGNOSIS — I700 Atherosclerosis of aorta: Secondary | ICD-10-CM | POA: Diagnosis not present

## 2015-06-10 DIAGNOSIS — E785 Hyperlipidemia, unspecified: Secondary | ICD-10-CM | POA: Diagnosis not present

## 2015-06-10 DIAGNOSIS — Z720 Tobacco use: Secondary | ICD-10-CM | POA: Insufficient documentation

## 2015-10-26 ENCOUNTER — Telehealth: Payer: Self-pay | Admitting: Cardiovascular Disease

## 2015-10-26 NOTE — Telephone Encounter (Signed)
1. What dental office are you calling from? Dr. Jolayne Panther  2. What is your office phone and fax number? [phone]  3. What type of procedure is the patient having performed? Teeth extraction  4. What date is procedure scheduled? Not yet scheduled  5. What is your question (ex. Antibiotics prior to procedure, holding medication-we need to know how long dentist wants pt to hold med)? What to do regarding blood thinner?

## 2015-10-26 NOTE — Telephone Encounter (Signed)
Sent fax to Dr. Jolayne Panther office.

## 2015-10-26 NOTE — Telephone Encounter (Signed)
Ok to hold plavix 5 days before dental extraction  No need for SBE

## 2016-06-09 ENCOUNTER — Other Ambulatory Visit: Payer: Self-pay | Admitting: Cardiovascular Disease

## 2016-06-13 ENCOUNTER — Other Ambulatory Visit: Payer: Self-pay | Admitting: Family Medicine

## 2016-06-13 DIAGNOSIS — I714 Abdominal aortic aneurysm, without rupture, unspecified: Secondary | ICD-10-CM

## 2016-06-16 ENCOUNTER — Ambulatory Visit
Admission: RE | Admit: 2016-06-16 | Discharge: 2016-06-16 | Disposition: A | Discharge status: Home / Self Care | Payer: Medicare Other | Source: Ambulatory Visit | Attending: Family Medicine | Admitting: Family Medicine

## 2016-06-16 DIAGNOSIS — I714 Abdominal aortic aneurysm, without rupture, unspecified: Secondary | ICD-10-CM

## 2016-06-16 IMAGING — US US AORTA
1 series · 14 of 19 positions shown · non-contrast
Comparison: [DATE]

CLINICAL DATA: Abdominal aortic aneurysm

EXAM:
ULTRASOUND OF ABDOMINAL AORTA
TECHNIQUE: Ultrasound examination of the abdominal aorta was performed to
evaluate for abdominal aortic aneurysm.

[Series 1: us aorta · 0.28mm/px · 14 of 19 slices shown]
[im 1/19]
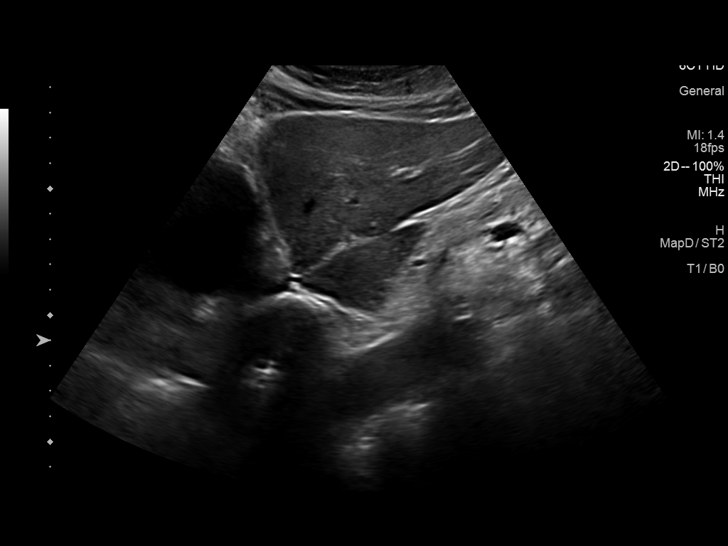
[im 3/19]
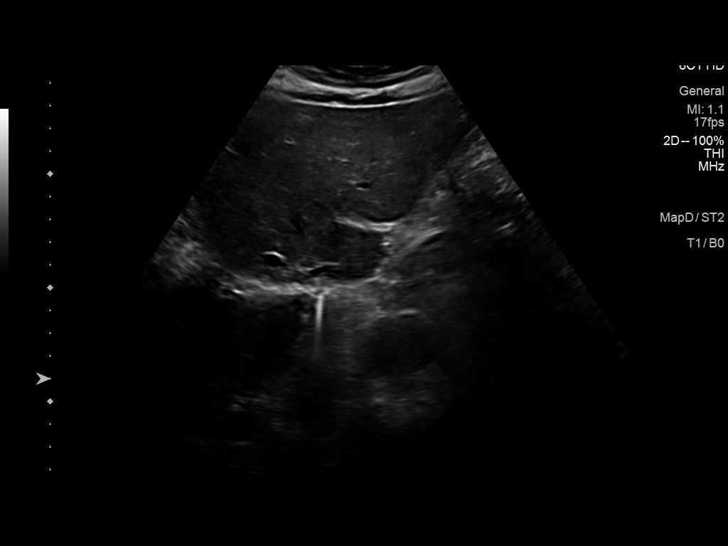
[im 4/19]
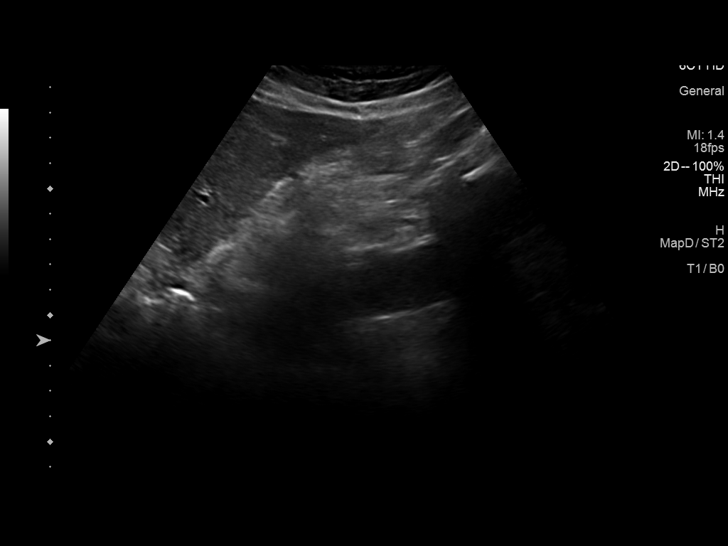
[im 5/19]
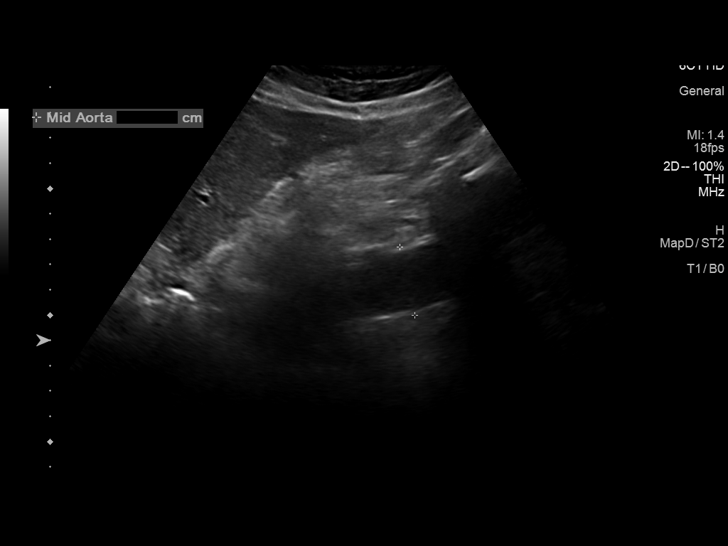
[im 7/19]
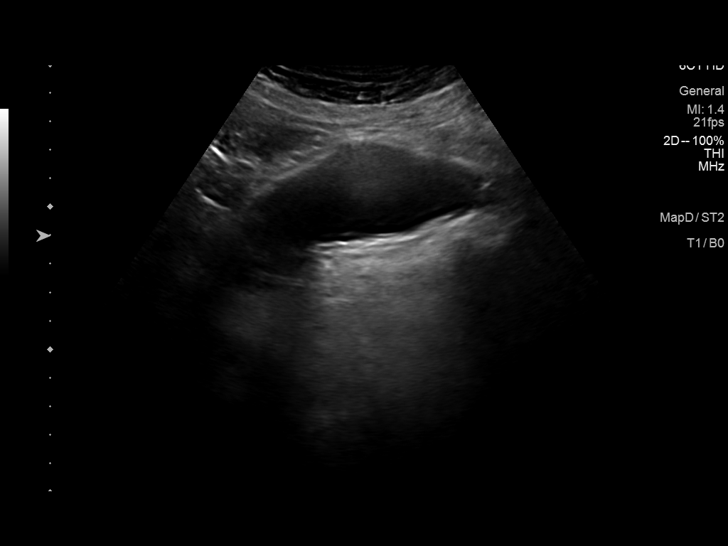
[im 8/19]
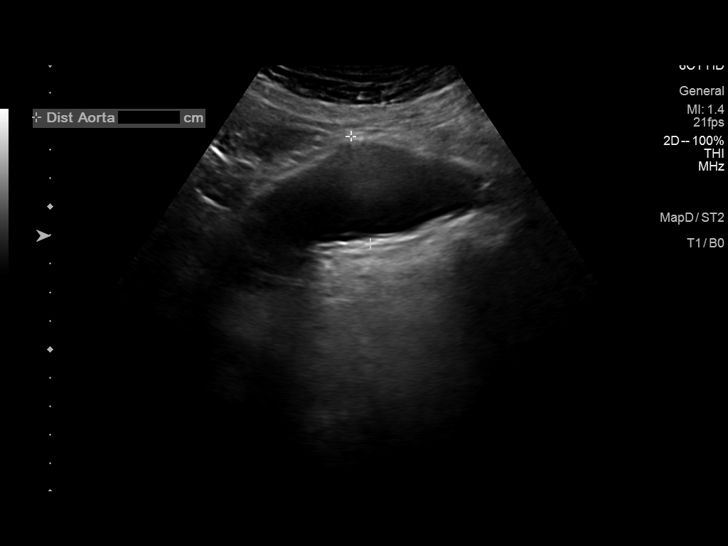
[im 9/19]
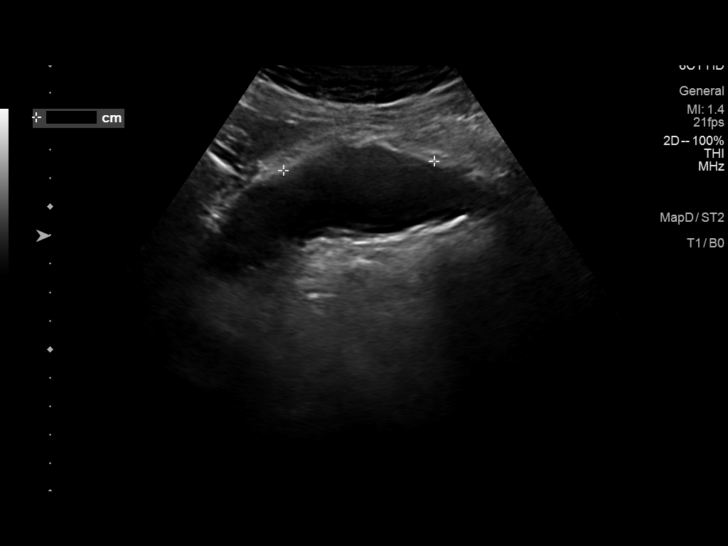
[im 11/19]
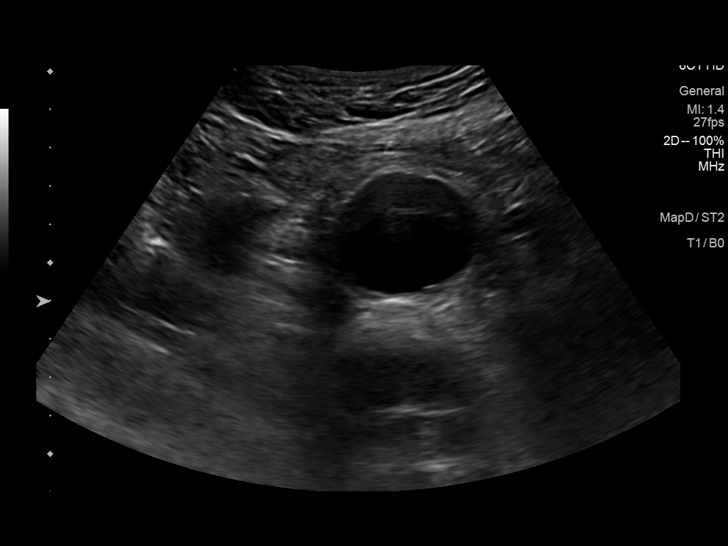
[im 12/19]
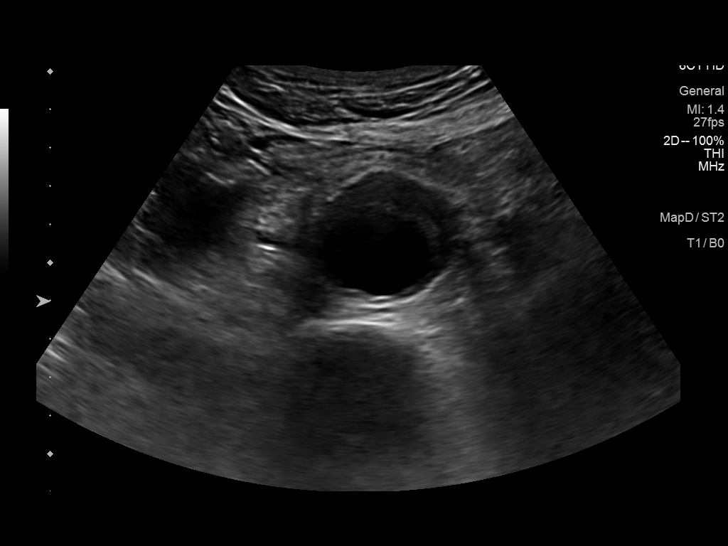
[im 13/19]
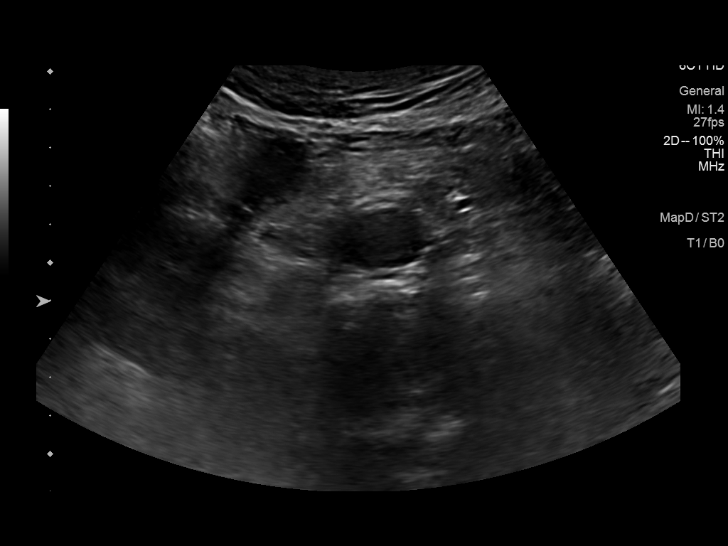
[im 15/19]
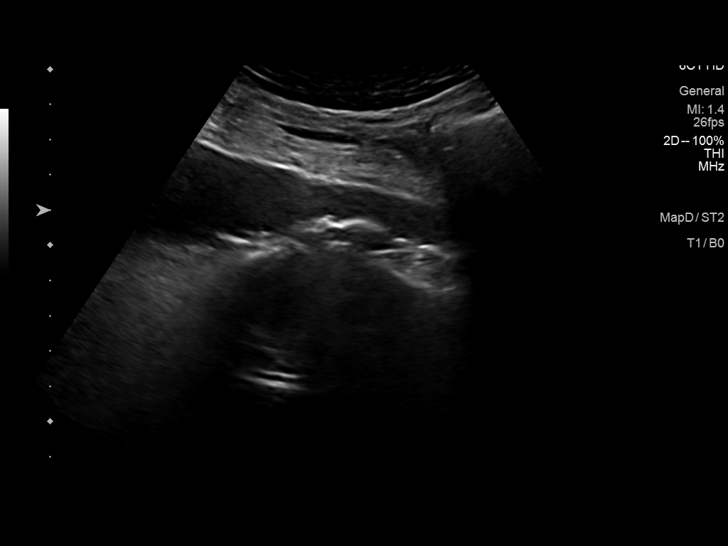
[im 16/19]
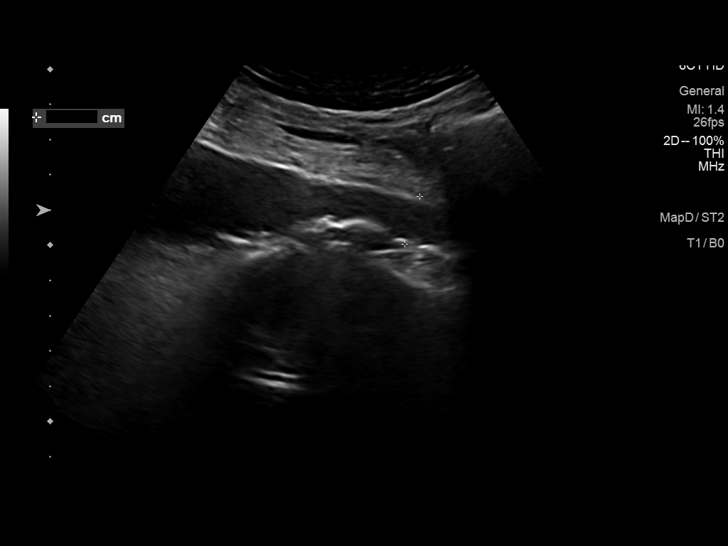
[im 17/19]
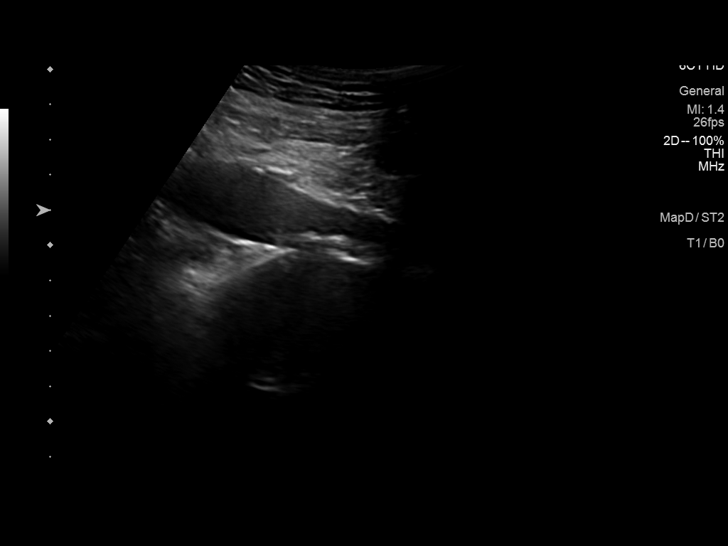
[im 19/19]
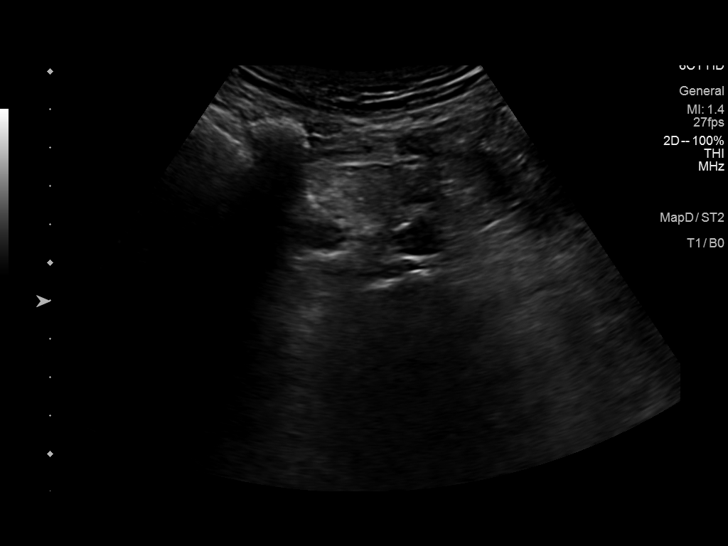

[14 of 19 positions shown; findings below may reference images not displayed]

FINDINGS: Abdominal Aorta

The distal abdominal aorta is focally dilated. Maximal diameter is
3.8 cm. This extends a length of 5.3 cm.

Maximum Diameter: Maximal diameter of the distal aorta is 3.8 cm.
IMPRESSION: Maximal diameter of the distal abdominal aorta is 3.8 cm. This is
stable compared with the prior study from last year. Recommend
followup by ultrasound in 2 years. This recommendation follows ACR
consensus guidelines: White Paper of the ACR Incidental Findings
Committee II on Vascular Findings. [HOSPITAL] [QZ];

## 2016-07-14 ENCOUNTER — Other Ambulatory Visit: Payer: Self-pay | Admitting: Cardiovascular Disease

## 2016-07-14 NOTE — Telephone Encounter (Signed)
clopidogrel (PLAVIX) 75 MG tablet  Medication  Date: 06/09/2016 Department: [address] Office Ordering/Authorizing: Josue Hector, MD  Order Providers   Prescribing Provider Encounter Provider  Josue Hector, MD Josue Hector, MD  Medication Detail    Disp Refills Start End   clopidogrel (PLAVIX) 75 MG tablet 30 tablet 0 06/09/2016    Sig - Route: Take 1 tablet (75 mg total) by mouth daily with breakfast. *Please call and schedule a one year follow up appointment* - Oral   E-Prescribing Status: Receipt confirmed by pharmacy (06/09/2016 12:05 PM EST)   Pharmacy   [facility] ([facility] [place] - [place]

## 2016-07-30 NOTE — Progress Notes (Signed)
Patient ID: Connor Cuevas, male   DOB: 06/03/1940, 76 y.o.   MRN: [id number] Connor Cuevas is a 76 y.o.  male with a history of small AAA, COPD, RUL pulmonary nodule, HL, tobacco abuse. Connor Cuevas was admitted 10/8-10/10/15  with chest pain consistent with unstable angina. Connor Cuevas had one spurious elevated POC troponin. The rest of his cardiac markers were normal. 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 Connor Cuevas was treated with a DES. EF was normal by echocardiogram. Connor Cuevas returns for FU. Connor Cuevas is doing well since discharge from the hospital. Connor Cuevas denies chest pain, significant dyspnea, orthopnea, PND or edema. Connor Cuevas has noted some fatigue. Connor Cuevas seems to be slowly improving.  Lipids elevated 2/18  TC 247  And LDL 174  Started back on lipitor by primary   Studies: - LHC (10/15): Mid RCA 99% >> PCI: 3.25x18 mm Xience DES to the mid RCA - Echo (10/15): EF 55-60%, normal wall motion, lipomatous hypertrophy - US Aorta (5/14): Mild aneurysmal dilatation of the aorta. Previously, max  diameter in the transverse dimension 3.3 cm. Connor Cuevas is stable today. Rec FU [place] in 3 years.    Recent Labs/Images:  Recent Labs (within last 365 days)     Recent Labs  01/09/14 0525 01/10/14 0258  NA 141 141  K 4.0 4.5  BUN 20 18  CREATININE 0.82 0.79  ALT 10 --   HGB 14.6 14.7       Dg Chest Portable 1 View 01/08/2014 IMPRESSION: 1. No acute cardiopulmonary disease or significant interval change. 2. Chronic interstitial coarsening. 3. Right upper lobe pulmonary nodule. Electronically Signed By: Lawrence Santiago M.D. On: 01/08/2014 22:52   F/U ABI's 10/15 reviewed and normal    Needs f/u cXR for lung nodule seen  First in 2015 [place] AAA 3.8 stable reviewed 06/16/16    ROS: Denies fever, malais, weight loss, blurry vision, decreased visual acuity, cough, sputum, SOB, hemoptysis, pleuritic pain, palpitaitons, heartburn, abdominal pain, melena, lower extremity edema,  claudication, or rash.  All other systems reviewed and negative  General: Affect appropriate Healthy:  appears stated age 69: normal Neck supple with no adenopathy JVP normal no bruits no thyromegaly Lungs clear with no wheezing and good diaphragmatic motion Heart:  S1/S2 no murmur, no rub, gallop or click PMI normal Abdomen: benighn, BS positve, no tenderness, no AAA no bruit.  No HSM or HJR Distal pulses intact with no bruits No edema Neuro non-focal Skin warm and dry No muscular weakness   Current Outpatient Prescriptions  Medication Sig Dispense Refill  . aspirin 81 MG tablet Take 81 mg by mouth daily.     Marland Kitchen atenolol (TENORMIN) 25 MG tablet Take 1 tablet (25 mg total) by mouth daily. 90 tablet 3  . atorvastatin (LIPITOR) 40 MG tablet Take 40 mg by mouth daily.    . brimonidine (ALPHAGAN P) 0.1 % SOLN Place 1 drop into the right eye 2 (two) times daily.    . clopidogrel (PLAVIX) 75 MG tablet Take 1 tablet (75 mg total) by mouth daily. 90 tablet 3  . Coenzyme Q10 (CO Q 10) 100 MG CAPS Take 100 mg by mouth daily.    . dorzolamide-timolol (COSOPT) 22.3-6.8 MG/ML ophthalmic solution Place 1 drop into both eyes 2 (two) times daily.    Marland Kitchen HYDROcodone-acetaminophen (NORCO) 5-325 MG per tablet Take 0.5 tablets by mouth every 6 (six) hours as needed for moderate pain. For pain    . LORazepam (ATIVAN) 0.5 MG tablet Take  0.5 mg by mouth daily as needed. For anxiety    . Multiple Vitamins-Minerals (ICAPS AREDS FORMULA PO) Take 1 tablet by mouth daily.    . nitroGLYCERIN (NITROSTAT) 0.4 MG SL tablet Place 1 tablet (0.4 mg total) under the tongue every 5 (five) minutes as needed for chest pain. 25 tablet 12  . OMEGA-3 KRILL OIL PO Take 200 mg by mouth daily.    Marland Kitchen tiotropium (SPIRIVA) 18 MCG inhalation capsule Place 18 mcg into inhaler and inhale every morning.     . traZODone (DESYREL) 100 MG tablet Take 100 mg by mouth daily.     No current facility-administered medications for Connor Cuevas  visit.     Allergies  Macrobid [nitrofurantoin]  Electrocardiogram:  01/19/14   SR rate 73  Septal infarct LAE otherwise normal  08/02/16 SR rate 80 normal   Assessment and Plan CAD: DES to RCA 2015 Stable with no angina and good activity level.  Continue medical Rx AAA:  3.8 cm [place] 06/16/16  f/u duplex  2 years  Chol:  Cholesterol is at goal.  Continue current dose of statin and diet Rx.  No myalgias or side effects.  F/U  LFT's in 6 months. No results found for: LDLCALC           COPD:  With RUL lung nodule  Stable by CXR 06/08/15 f/u CXR ordered

## 2016-08-02 ENCOUNTER — Ambulatory Visit (INDEPENDENT_AMBULATORY_CARE_PROVIDER_SITE_OTHER)
Admission: RE | Admit: 2016-08-02 | Discharge: 2016-08-02 | Disposition: A | Discharge status: Home / Self Care | Payer: Medicare Other | Source: Ambulatory Visit | Attending: Cardiovascular Disease | Admitting: Cardiovascular Disease

## 2016-08-02 ENCOUNTER — Ambulatory Visit (INDEPENDENT_AMBULATORY_CARE_PROVIDER_SITE_OTHER): Payer: Medicare Other | Admitting: Cardiovascular Disease

## 2016-08-02 ENCOUNTER — Encounter: Payer: Self-pay | Admitting: Cardiovascular Disease

## 2016-08-02 ENCOUNTER — Encounter (INDEPENDENT_AMBULATORY_CARE_PROVIDER_SITE_OTHER): Payer: Self-pay

## 2016-08-02 VITALS — BP 120/80 | HR 81 | Ht 69.0 in | Wt 145.0 lb

## 2016-08-02 DIAGNOSIS — I2583 Coronary atherosclerosis due to lipid rich plaque: Secondary | ICD-10-CM | POA: Diagnosis not present

## 2016-08-02 DIAGNOSIS — R911 Solitary pulmonary nodule: Secondary | ICD-10-CM

## 2016-08-02 DIAGNOSIS — I2510 Atherosclerotic heart disease of native coronary artery without angina pectoris: Secondary | ICD-10-CM | POA: Diagnosis not present

## 2016-08-02 HISTORY — DX: Solitary pulmonary nodule: R91.1

## 2016-08-02 IMAGING — DX DG CHEST 2V
2 series · 2 of 2 positions shown · non-contrast
Comparison: Radiographs [DATE] and [DATE].

CLINICAL DATA: Pulmonary nodule.

EXAM:
CHEST  2 VIEW

[chest pa]
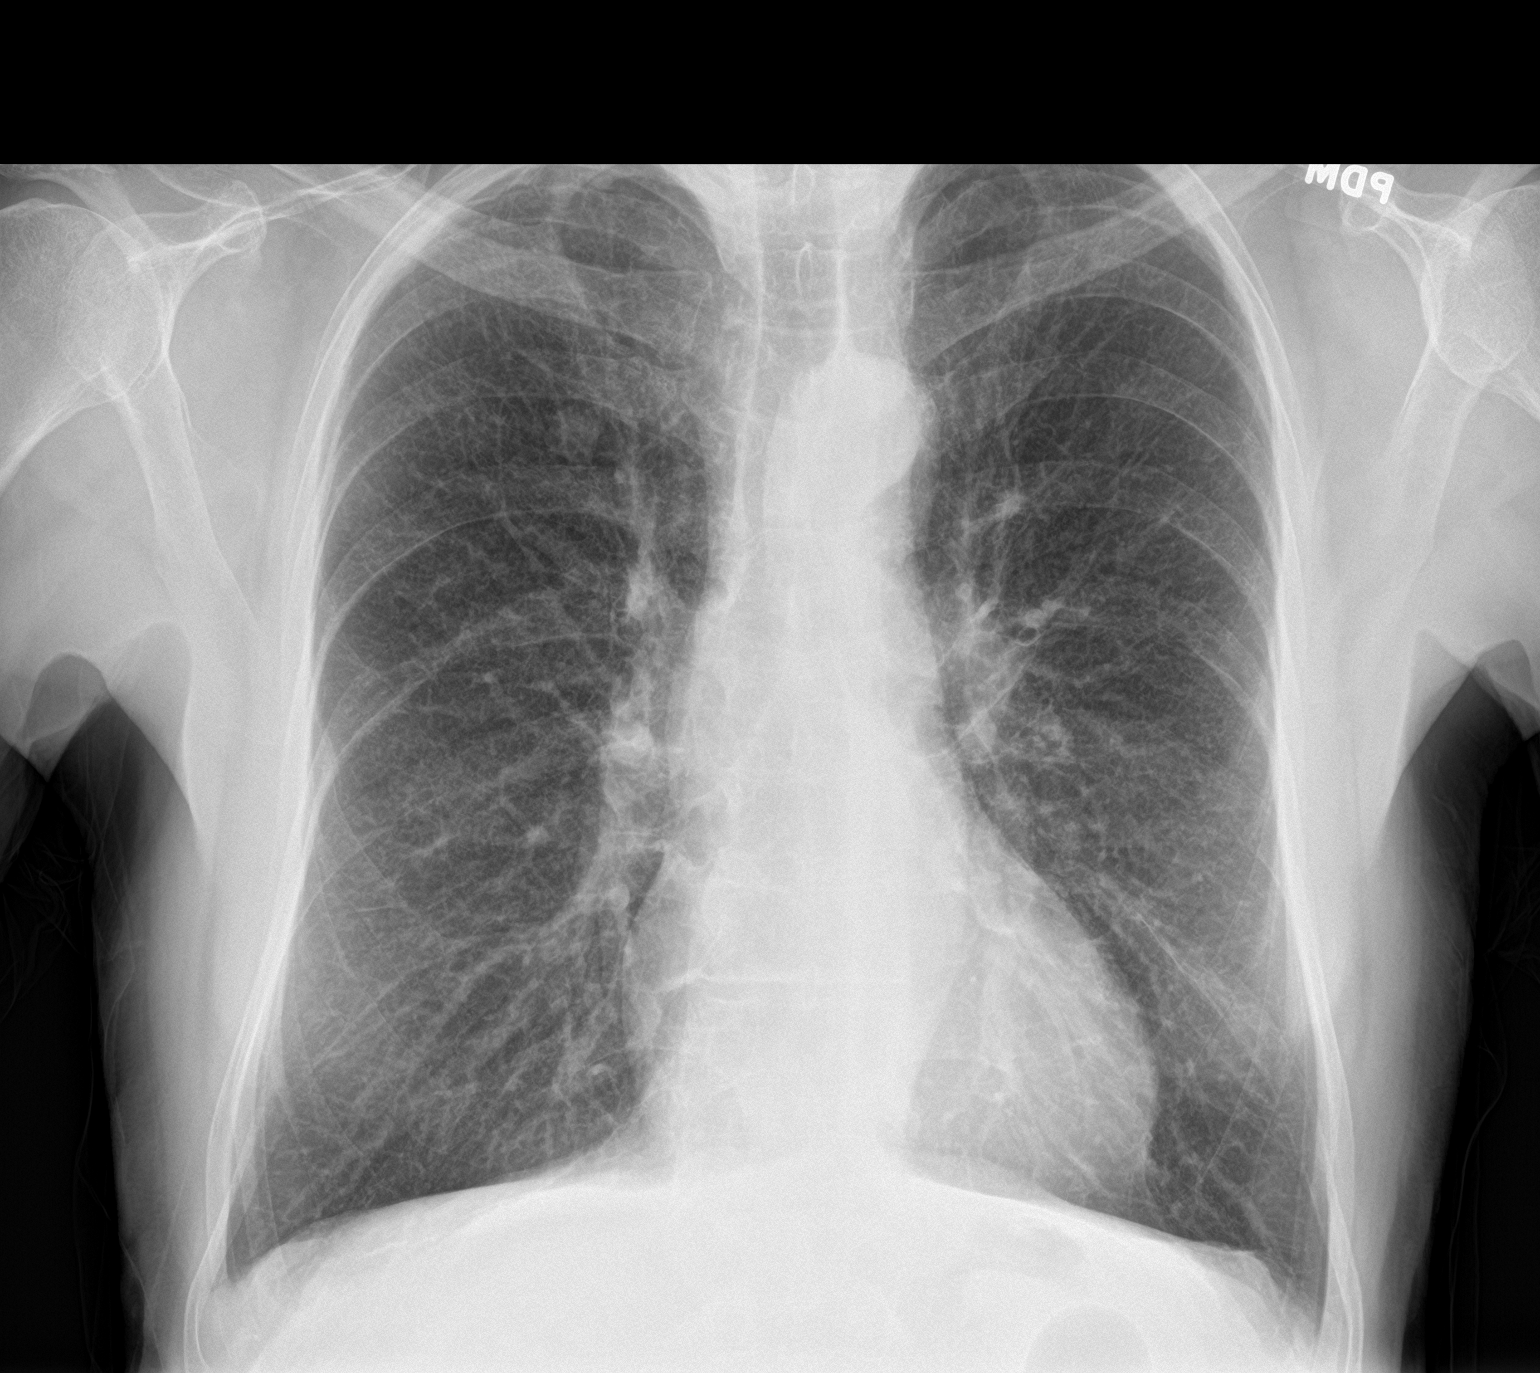

[chest lat]
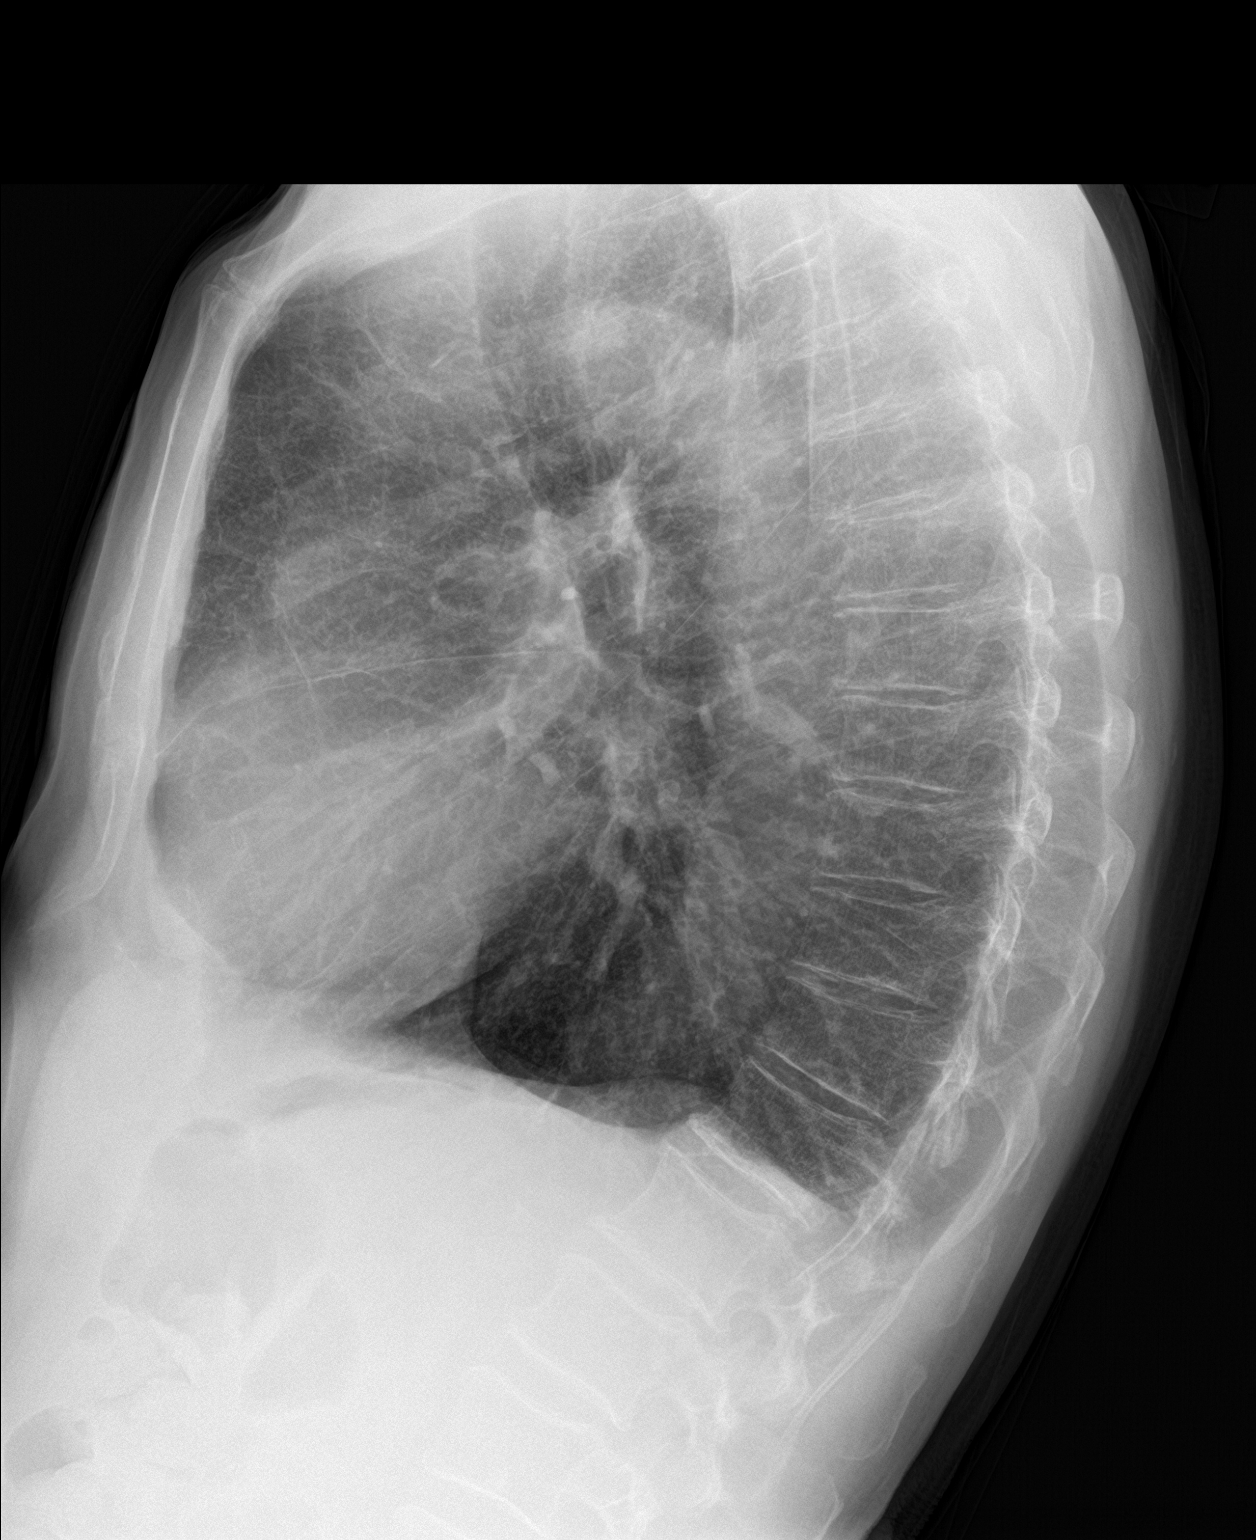

[2 of 2 positions shown; findings below may reference images not displayed]

FINDINGS: The heart size and mediastinal contours are within normal limits. No
pneumothorax or pleural effusion is noted. Left lung is clear.
Stable right upper lobe nodule is noted compared to prior exams. The
visualized skeletal structures are unremarkable.
IMPRESSION: Stable right upper lobe nodule compared to prior exams. No other
abnormality seen in the chest.

## 2016-08-02 MED ORDER — CLOPIDOGREL BISULFATE 75 MG PO TABS: 75.0000 mg | ORAL_TABLET | Freq: Every day | ORAL | 3 refills | Status: DC

## 2016-08-02 MED ORDER — ATENOLOL 25 MG PO TABS: 25.0000 mg | ORAL_TABLET | Freq: Every day | ORAL | 3 refills | Status: DC

## 2016-08-02 NOTE — Patient Instructions (Addendum)
Medication Instructions:  Your physician recommends that you continue on your current medications as directed. Please refer to the Current Medication list given to you today.  Labwork: NONE  Testing/Procedures: A chest x-ray takes a picture of the organs and structures inside the chest, including the heart, lungs, and blood vessels. This test can show several things, including, whether the heart is enlarges; whether fluid is building up in the lungs; and whether pacemaker / defibrillator leads are still in place.   Follow-Up: Your physician wants you to follow-up in: 12 months with Dr. Nishan. You will receive a reminder letter in the mail two months in advance. If you don't receive a letter, please call our office to schedule the follow-up appointment.   If you need a refill on your cardiac medications before your next appointment, please call your pharmacy.    

## 2016-10-02 ENCOUNTER — Telehealth: Payer: Self-pay | Admitting: Emergency Medicine

## 2016-10-02 NOTE — Telephone Encounter (Signed)
Spoke with pt and I noticed we have never seen this pt before nor does he have any upcoming appts for a consult. Pt states that this message was created in error because he was calling for his wife and not him. Will close this message as he does not need anything from [place] in regards to him.

## 2017-04-03 DIAGNOSIS — C4490 Unspecified malignant neoplasm of skin, unspecified: Secondary | ICD-10-CM

## 2017-04-03 HISTORY — DX: Unspecified malignant neoplasm of skin, unspecified: C44.90

## 2017-07-25 ENCOUNTER — Encounter: Payer: Self-pay | Admitting: Cardiovascular Disease

## 2017-08-03 NOTE — Progress Notes (Signed)
Patient ID: Connor Cuevas, male   DOB: 01-30-1941, 77 y.o.   MRN: [id number]   77 y.o. with history of COPD, RUL nodule previous smoker. CAD with DES to the mid RCA in October 2015. EF normal On statin for HLD. Needs f/u US abdomen March 2020 for diameter of 3.8 cm   No chest pain Compliant with meds   Some stress lately with wife's health. She has COPD and was hospitalized in [place] With pneumonia then again here on return from [place] with sepsis    ROS: Denies fever, malais, weight loss, blurry vision, decreased visual acuity, cough, sputum, SOB, hemoptysis, pleuritic pain, palpitaitons, heartburn, abdominal pain, melena, lower extremity edema, claudication, or rash.  All other systems reviewed and negative  General: BP 128/84   Pulse 77   Ht 5\' 9"  (1.753 m)   Wt 139 lb (63 kg)   SpO2 97%   BMI 20.53 kg/m  Affect appropriate Healthy:  appears stated age 38: normal Neck supple with no adenopathy JVP normal no bruits no thyromegaly Lungs clear with no wheezing and good diaphragmatic motion Heart:  S1/S2 no murmur, no rub, gallop or click PMI normal Abdomen: benighn, BS positve, no tenderness, no AAA no bruit.  No HSM or HJR Distal pulses intact with no bruits No edema Neuro non-focal Skin warm and dry No muscular weakness    Current Outpatient Medications  Medication Sig Dispense Refill  . aspirin 81 MG tablet Take 81 mg by mouth daily.     Marland Kitchen atenolol (TENORMIN) 25 MG tablet Take 1 tablet (25 mg total) by mouth daily. 90 tablet 3  . atorvastatin (LIPITOR) 40 MG tablet Take 40 mg by mouth daily.    . brimonidine (ALPHAGAN P) 0.1 % SOLN Place 1 drop into the right eye 2 (two) times daily.    . clopidogrel (PLAVIX) 75 MG tablet Take 1 tablet (75 mg total) by mouth daily. 90 tablet 3  . Coenzyme Q10 (CO Q 10) 100 MG CAPS Take 100 mg by mouth daily.    . dorzolamide-timolol (COSOPT) 22.3-6.8 MG/ML ophthalmic solution Place 1 drop into both eyes 2 (two) times daily.    Marland Kitchen  HYDROcodone-acetaminophen (NORCO) 5-325 MG per tablet Take 0.5 tablets by mouth every 6 (six) hours as needed for moderate pain. For pain    . LORazepam (ATIVAN) 0.5 MG tablet Take 0.5 mg by mouth daily as needed. For anxiety    . Multiple Vitamins-Minerals (ICAPS AREDS FORMULA PO) Take 1 tablet by mouth daily.    . nitroGLYCERIN (NITROSTAT) 0.4 MG SL tablet Place 1 tablet (0.4 mg total) under the tongue every 5 (five) minutes as needed for chest pain. 25 tablet 12  . OMEGA-3 KRILL OIL PO Take 200 mg by mouth daily.    Marland Kitchen tiotropium (SPIRIVA) 18 MCG inhalation capsule Place 18 mcg into inhaler and inhale every morning.     . traZODone (DESYREL) 100 MG tablet Take 100 mg by mouth daily.     No current facility-administered medications for this visit.     Allergies  Macrobid [nitrofurantoin]  Electrocardiogram:  08/09/17  SR rate 72 normal  Assessment and Plan  CAD: DES to RCA 2015 Stable with no angina and good activity level.  Continue medical Rx  AAA:  3.8 cm [place] 06/16/16  f/u duplex  2 years   HLD:  On statin labs with primary  COPD:  With RUL lung nodule  Stable by CXR 08/02/16 f/u with primary

## 2017-08-09 ENCOUNTER — Encounter: Payer: Self-pay | Admitting: Cardiovascular Disease

## 2017-08-09 ENCOUNTER — Ambulatory Visit: Payer: Medicare Other | Admitting: Cardiovascular Disease

## 2017-08-09 VITALS — BP 128/84 | HR 77 | Ht 69.0 in | Wt 139.0 lb

## 2017-08-09 DIAGNOSIS — I2583 Coronary atherosclerosis due to lipid rich plaque: Secondary | ICD-10-CM

## 2017-08-09 DIAGNOSIS — I714 Abdominal aortic aneurysm, without rupture, unspecified: Secondary | ICD-10-CM

## 2017-08-09 DIAGNOSIS — I2510 Atherosclerotic heart disease of native coronary artery without angina pectoris: Secondary | ICD-10-CM | POA: Diagnosis not present

## 2017-08-09 NOTE — Patient Instructions (Addendum)
Medication Instructions:  Your physician recommends that you continue on your current medications as directed. Please refer to the Current Medication list given to you today.  Labwork: NONE  Testing/Procedures: Your physician has requested that you have an abdominal aorta duplex in March 2020. During this test, an ultrasound is used to evaluate the aorta. Allow 30 minutes for this exam. Do not eat after midnight the day before and avoid carbonated beverages.  Follow-Up: Your physician wants you to follow-up in: 1 year with Dr. [name]. You will receive a reminder letter in the mail two months in advance. If you don't receive a letter, please call our office to schedule the follow-up appointment.   If you need a refill on your cardiac medications before your next appointment, please call your pharmacy.

## 2017-08-30 ENCOUNTER — Other Ambulatory Visit: Payer: Self-pay | Admitting: Cardiovascular Disease

## 2018-03-18 ENCOUNTER — Other Ambulatory Visit: Payer: Self-pay

## 2018-03-18 DIAGNOSIS — I714 Abdominal aortic aneurysm, without rupture, unspecified: Secondary | ICD-10-CM

## 2018-04-16 ENCOUNTER — Ambulatory Visit (HOSPITAL_COMMUNITY)
Admission: RE | Admit: 2018-04-16 | Discharge: 2018-04-16 | Disposition: A | Discharge status: Home / Self Care | Payer: Medicare Other | Source: Ambulatory Visit | Attending: Vascular Surgery | Admitting: Vascular Surgery

## 2018-04-16 ENCOUNTER — Encounter: Payer: Self-pay | Admitting: Vascular Surgery

## 2018-04-16 ENCOUNTER — Other Ambulatory Visit: Payer: Self-pay

## 2018-04-16 ENCOUNTER — Ambulatory Visit (INDEPENDENT_AMBULATORY_CARE_PROVIDER_SITE_OTHER): Payer: Medicare Other | Admitting: Vascular Surgery

## 2018-04-16 VITALS — BP 140/91 | HR 81 | Resp 18 | Ht 69.0 in | Wt 137.9 lb

## 2018-04-16 DIAGNOSIS — I714 Abdominal aortic aneurysm, without rupture, unspecified: Secondary | ICD-10-CM

## 2018-04-16 HISTORY — DX: Abdominal aortic aneurysm, without rupture, unspecified: I71.40

## 2018-04-16 NOTE — Progress Notes (Signed)
Patient name: Connor Cuevas MRN: [id number] DOB: [birth year] Sex: male  REASON FOR CONSULT: 4.6 cm abdominal aortic aneurysm  HPI: Connor HENDRICKSEN is a 78 y.o. male, with history of coronary artery disease status post PCI, COPD, right ureteral stones that presents as referral from urology for evaluation of a 4.6 cm abdominal aortic aneurysm.  Patient states he has known about his aneurysm since 2014.  This has been followed by his primary care doctor and states he had a duplex every 2 years.  He recently underwent a CT urogram for right kidney stones and there was concern that it was now 4.6 cm that prompted the referral.  He reports some right-sided pain that radiates down to his scrotum but no other significant abdominal or back pain.  He does smoke up to 1 to 2 packs/day.  He has never had any other history of attempted abdominal aneurysm repair.  States he is otherwise very functional.  He denies any lower extremity claudication or rest pain or tissue loss.  Past Medical History:  Diagnosis Date  . AAA (abdominal aortic aneurysm) (HCC)    a. 3.0 CM  PER CT 08-21-2011  . Anxiety   . Arthritis   . CAD (coronary artery disease)    a. 01/09/14: [place] s/p DES to RCA   . COPD (chronic obstructive pulmonary disease) ([place])   . Glaucoma of both eyes   . History of kidney stones   . Hyperlipidemia   . Interstitial lung disease ([place])   . Leg pain    ABIs (10/15):  R 1.3, L 1.2, normal  . Pulmonary nodule, right    a. Right upper lobe  . Right ureteral stone   . 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 Aortosclerosis, bilateral - mild with calcification. Per Dr. Moreen Fowler at [facility]   . Tobacco abuse     Past Surgical History:  Procedure Laterality Date  . CATARACT EXTRACTION W/ INTRAOCULAR LENS  IMPLANT, BILATERAL  1997 (APPROX)  . CYSTOSCOPY/RETROGRADE/URETEROSCOPY/STONE EXTRACTION WITH BASKET  10/17/2011   Procedure: CYSTOSCOPY/RETROGRADE/URETEROSCOPY/STONE EXTRACTION WITH BASKET;   Surgeon: Hanley Ben, MD;  Location: [facility];  Service: Urology;  Laterality: Right;  . LAPAROSCOPIC LEFT INGUINAL (OBTURATOR) HERNIA AND UMBILICAL HERNIA REPAIR  01-13-2011  . LEFT HEART CATHETERIZATION WITH CORONARY ANGIOGRAM N/A 01/09/2014   Procedure: LEFT HEART CATHETERIZATION WITH CORONARY ANGIOGRAM;  Surgeon: Josue Hector, MD;  Location[facility] C[facility] LAB;  Service: Cardiovascular;  Laterality: N/A;  . LEFT URETEROSCOPIC STONE EXTRACTION  11-30-2000   W/ PREVIOUS ESWL AND URETERAL STENT PLACEMENT  . REPAIR RIGHT INGUINAL HERNIA W/ MESH  12-31-2000  . URETEROLITHOTOMY  2009    Family History  Problem Relation Age of Onset  . Hypertension Mother   . Heart attack Other        uncle  . Stroke Other   . Stroke Maternal Grandmother   . Stroke Maternal Grandfather   . Stroke Paternal Grandmother   . Stroke Paternal Grandfather     SOCIAL HISTORY: Social History   Socioeconomic History  . Marital status: Married    Spouse name: Not on file  . Number of children: Not on file  . Years of education: Not on file  . Highest education level: Not on file  Occupational History  . Not on file  Social Needs  . Financial resource strain: Not on file  . Food insecurity:    Worry: Not on file    Inability:  Not on file  . Transportation needs:    Medical: Not on file    Non-medical: Not on file  Tobacco Use  . Smoking status: Former Smoker    Packs/day: 1.00    Years: 53.00    Pack years: 53.00    Types: Cigarettes    Last attempt to quit: 01/09/2014    Years since quitting: 4.2  . Smokeless tobacco: Never Used  Substance and Sexual Activity  . Alcohol use: No  . Drug use: No  . Sexual activity: Yes  Lifestyle  . Physical activity:    Days per week: Not on file    Minutes per session: Not on file  . Stress: Not on file  Relationships  . Social connections:    Talks on phone: Not on file    Gets together: Not on file    Attends religious service: Not on  file    Active member of club or organization: Not on file    Attends meetings of clubs or organizations: Not on file    Relationship status: Not on file  . Intimate partner violence:    Fear of current or ex partner: Not on file    Emotionally abused: Not on file    Physically abused: Not on file    Forced sexual activity: Not on file  Other Topics Concern  . Not on file  Social History Narrative  . Not on file    Allergies  Allergen Reactions  . Macrobid [Nitrofurantoin] Anaphylaxis    Current Outpatient Medications  Medication Sig Dispense Refill  . aspirin 81 MG tablet Take 81 mg by mouth daily.     Marland Kitchen atenolol (TENORMIN) 25 MG tablet Take 1 tablet (25 mg total) by mouth daily. 90 tablet 3  . atorvastatin (LIPITOR) 40 MG tablet Take 40 mg by mouth daily.    . brimonidine (ALPHAGAN P) 0.1 % SOLN Place 1 drop into the right eye 2 (two) times daily.    . clopidogrel (PLAVIX) 75 MG tablet TAKE 1 TABLET BY MOUTH ONCE DAILY 90 tablet 3  . Coenzyme Q10 (CO Q 10) 100 MG CAPS Take 100 mg by mouth daily.    . dorzolamide-timolol (COSOPT) 22.3-6.8 MG/ML ophthalmic solution Place 1 drop into both eyes 2 (two) times daily.    Marland Kitchen HYDROcodone-acetaminophen (NORCO) 5-325 MG per tablet Take 0.5 tablets by mouth every 6 (six) hours as needed for moderate pain. For pain    . LORazepam (ATIVAN) 0.5 MG tablet Take 0.5 mg by mouth daily as needed. For anxiety    . Multiple Vitamins-Minerals (ICAPS AREDS FORMULA PO) Take 1 tablet by mouth daily.    . nitroGLYCERIN (NITROSTAT) 0.4 MG SL tablet Place 1 tablet (0.4 mg total) under the tongue every 5 (five) minutes as needed for chest pain. 25 tablet 12  . OMEGA-3 KRILL OIL PO Take 200 mg by mouth daily.    Marland Kitchen tiotropium (SPIRIVA) 18 MCG inhalation capsule Place 18 mcg into inhaler and inhale every morning.     . traZODone (DESYREL) 100 MG tablet Take 100 mg by mouth daily.     No current facility-administered medications for this visit.     REVIEW OF  SYSTEMS:  [X]  denotes positive finding, [ ]  denotes negative finding Cardiac  Comments:  Chest pain or chest pressure:    Shortness of breath upon exertion:    Short of breath when lying flat:    Irregular heart rhythm:  Vascular    Pain in calf, thigh, or hip brought on by ambulation:    Pain in feet at night that wakes you up from your sleep:     Blood clot in your veins:    Leg swelling:         Pulmonary    Oxygen at home:    Productive cough:     Wheezing:         Neurologic    Sudden weakness in arms or legs:     Sudden numbness in arms or legs:     Sudden onset of difficulty speaking or slurred speech:    Temporary loss of vision in one eye:     Problems with dizziness:         Gastrointestinal    Blood in stool:     Vomited blood:         Genitourinary    Burning when urinating:     Blood in urine:        Psychiatric    Major depression:         Hematologic    Bleeding problems:    Problems with blood clotting too easily:        Skin    Rashes or ulcers:        Constitutional    Fever or chills:      PHYSICAL EXAM: Vitals:   04/16/18 0927  BP: (!) 140/91  Pulse: 81  Resp: 18  SpO2: 95%  Weight: 137 lb 14.4 oz (62.6 kg)  Height: 5\' 9"  (1.753 m)    GENERAL: The patient is a well-nourished male, in no acute distress. The vital signs are documented above. CARDIAC: There is a regular rate and rhythm.  VASCULAR:  2+ radial pulse palpable BUE 2+ femoral pulse palpable bilateral groins 2+ Pt palpable BLE No tissue loss PULMONARY: There is good air exchange bilaterally without wheezing or rales. ABDOMEN: Soft and non-tender with normal pitched bowel sounds.  MUSCULOSKELETAL: There are no major deformities or cyanosis. NEUROLOGIC: No focal weakness or paresthesias are detected.   DATA:   Independently reviewed his CT urogram from 02/26/2018 and on my measurement approximate 4.4 cm infrarenal abdominal aortic aneurysm.  Duplex today  suggest the aneurysm is 4.3 cm and last measured 3.3 in 2017.  Assessment/Plan:  78 year old male that presents for evaluation of an approximate 4.4 cm infrarenal abdominal aortic aneurysm measured on a CT urogram.  Duplex today does confirm its 4.3 cm which correlates.  I do not suspect that this aneurysm has been growing rapidly given that it measured 3.3 cm in 2017 and it has been nearly 3 years.  Discussed with the patient typical indications for repair greater than 5.5 cm unless there is rapid growth like greater than 1 cm in a year.  Discussed with him the importance of smoking cessation.  He is already on aspirin and statin.  I will plan to see him back in 6 months with another aneurysm duplex and if aneurysm is relatively stable we can probably stretch out his surveillance to yearly.   Marty Heck, MD [facility] Office: [phone] Pager: [place]

## 2018-04-23 ENCOUNTER — Telehealth: Payer: Self-pay | Admitting: Cardiovascular Disease

## 2018-04-23 NOTE — Telephone Encounter (Signed)
° °  [facility] Pre-operative Risk Assessment    Request for surgical clearance:  1. What type of surgery is being performed?  Cystoscopy, Ureteroscopy and Laser Lithopripsy   2. When is this surgery scheduled?  Pending   3. What type of clearance is required (medical clearance vs. Pharmacy clearance to hold med vs. Both)? Both  4. Are there any medications that need to be held prior to surgery and how long?  Can pt hold his Plavix for 5 days prior to surgery  5. Practice name and name of physician performing surgery?  Dr Kathie Rhodes   6. What is your office phone number [phone]    7.   What is your office fax number [phone]  8.   Anesthesia type (None, local, MAC, general) ? General   Glyn Ade 04/23/2018, 12:31 PM  _________________________________________________________________   (provider comments below)

## 2018-04-24 NOTE — Telephone Encounter (Signed)
Ok to hold plavix and asa for 7 days

## 2018-04-24 NOTE — Telephone Encounter (Signed)
Dr [name] can you comment on holding aspirin and Plavix in this patient prior to urologic procedure.   Kerin Ransom PA-C 04/24/2018 4:15 PM

## 2018-04-25 NOTE — Telephone Encounter (Signed)
   Primary Cardiologist: [name], MD  Chart reviewed as part of pre-operative protocol coverage. Patient was contacted 04/25/2018 in reference to pre-operative risk assessment for pending surgery as outlined below.  Connor Cuevas was last seen on 08/09/2017 by Dr. [name].  Since that day, Connor Cuevas has done well from a cardiac standpoint. During his urologic work-up he was found to have an enlarging AAA for which he was seen by vascular surgery (Dr. Carlis Abbott) and recommended for q69mo monitoring. He continues to be fairly active and can easily complete 4 METs without anginal complaints.   Therefore, based on ACC/AHA guidelines, the patient would be at acceptable risk for the planned procedure without further cardiovascular testing.   Per Dr. Kyla Balzarine recommendations, patient may hold aspirin and plavix 7 days prior to his upcoming urologic procedure. He should restart these medications once cleared to do so by Dr. Karsten Ro.  I will route this recommendation to the requesting party via Epic fax function and remove from pre-op pool.  Please call with questions.  Abigail Butts, PA-C 04/25/2018, 2:28 PM

## 2018-04-26 ENCOUNTER — Other Ambulatory Visit: Payer: Self-pay | Admitting: Urology

## 2018-05-07 ENCOUNTER — Encounter (HOSPITAL_BASED_OUTPATIENT_CLINIC_OR_DEPARTMENT_OTHER): Payer: Self-pay

## 2018-05-07 ENCOUNTER — Other Ambulatory Visit: Payer: Self-pay

## 2018-05-07 NOTE — Progress Notes (Signed)
Spoke with:  Arpan NPO:  After Midnight, no gum, candy, or mints   Arrival time:  0615AM Labs: KUB, Istat 4 (EKG chart /epic) AM medications: Atenolol, Atorvastatin, Eyedrops, Inhaler Pre op orders: Yes Ride home:  [name] (wife) [phone]

## 2018-05-08 NOTE — Anesthesia Preprocedure Evaluation (Addendum)
Anesthesia Evaluation  Patient identified by MRN, date of birth, ID band Patient awake    Reviewed: Allergy & Precautions, NPO status , Patient's Chart, lab work & pertinent test results  Airway Mallampati: II  TM Distance: >3 FB Neck ROM: Full    Dental no notable dental hx.    Pulmonary COPD, former smoker,    Pulmonary exam normal breath sounds clear to auscultation       Cardiovascular + CAD  Normal cardiovascular exam Rhythm:Regular Rate:Normal     Neuro/Psych Anxiety negative neurological ROS  negative psych ROS   GI/Hepatic negative GI ROS, Neg liver ROS,   Endo/Other  negative endocrine ROS  Renal/GU negative Renal ROS  negative genitourinary   Musculoskeletal  (+) Arthritis , Osteoarthritis,    Abdominal   Peds negative pediatric ROS (+)  Hematology negative hematology ROS (+)   Anesthesia Other Findings   Reproductive/Obstetrics negative OB ROS                            Anesthesia Physical Anesthesia Plan  ASA: III  Anesthesia Plan: General   Post-op Pain Management:    Induction: Intravenous  PONV Risk Score and Plan: 2 and Ondansetron, Midazolam and Treatment may vary due to age or medical condition  Airway Management Planned: LMA  Additional Equipment:   Intra-op Plan:   Post-operative Plan: Extubation in OR  Informed Consent: I have reviewed the patients History and Physical, chart, labs and discussed the procedure including the risks, benefits and alternatives for the proposed anesthesia with the patient or authorized representative who has indicated his/her understanding and acceptance.     Dental advisory given  Plan Discussed with: CRNA  Anesthesia Plan Comments: (See PST note 05/08/18, Konrad Felix, PA-C)       Anesthesia Quick Evaluation

## 2018-05-08 NOTE — Progress Notes (Signed)
Anesthesia Chart Review   Case:  [id number] Date/Time:  05/13/18 1517   Procedure:  CYSTOSCOPY/RETROGRADE/URETEROSCOPY/HOLMIUM LASER (Right )   Anesthesia type:  General   Pre-op diagnosis:  RIGHT URETERAL STONE   Location:  [facility] OR ROOM 2 / [place]   Surgeon:  Kathie Rhodes, MD      DISCUSSION: 78 yo former smoker (67 pack years, quit 03/11/18) with h/o anxiety, iliac artery stenosis, COPD, HLD, Interstitial lung disease, AAA (4.3 cm on [place] 04/16/18), CAD (s/p DES to RCA 01/09/14), right ureteral stone scheduled for above surgery on 05/13/18 with Dr. Kathie Rhodes.   Cardiac clearance received 04/25/18.  Per Roby Lofts, PA-C, "Connor Cuevas was last seen on 08/09/2017 by Dr. [name].  Since that day, Connor Cuevas has done well from a cardiac standpoint. During his urologic work-up he was found to have an enlarging AAA for which he was seen by vascular surgery (Dr. Carlis Abbott) and recommended for q98mo monitoring. He continues to be fairly active and can easily complete 4 METs without anginal complaints. Therefore, based on ACC/AHA guidelines, the patient would be at acceptable risk for the planned procedure without further cardiovascular testing. Per Dr. Kyla Balzarine recommendations, patient may hold aspirin and plavix 7 days prior to his upcoming urologic procedure. He should restart these medications once cleared to do so by Dr. Karsten Ro."  Enlarging AAA (4.3cm on [place] 04/16/18) followed by Dr. Monica Martinez.  Last seen 04/16/18.  Per Dr. Carlis Abbott smoking cessation discussed, pt to continue on ASA and statin.  He will follow up with Dr. Carlis Abbott in 6 month for repeat duplex study.   Pt can proceed with planned procedure barring acute status change.  VS: Ht 5\' 9"  (1.753 m)   Wt 62.6 kg   BMI 20.38 kg/m   PROVIDERS: [name], MD is PCP    LABS: Labs DOS  (all labs ordered are listed, but only abnormal results are displayed)  Labs Reviewed - No data to display   IMAGES: VAS [place] AAA  04/16/2018 Summary: Abdominal Aorta: There is evidence of abnormal dilitation of the Distal and Mid Abdominal aorta. The largest aortic measurement is 4.3 cm. The largest aortic diameter has increased compared to prior exam. Previous diameter measurement was 3.3 cm obtained  on 06/10/2015.  CT Abdomen 02/26/18 1. Proximal right ureteral 10 x 7 mm stone with mild right hydroureteronephrosis.  Numerous additional nonobstructing stones in both kidneys.  2. Enlarging 4.6 cm infrarenal abdominal aortic aneurysm.  Recommend follow-up CTA of the abdomen and pelvis in 6 months and vascular surgery referral/consultation.  This recommendation follows ACR consensus guidelines: white paper of the ACR Incidental Findings Committee II on Vascular Findings.Natasha Mead Coll Radiol 2013; [phone] 3. Mildly enlarged prostate 4. Aortic Atherosclerosis  EKG: 08/09/2017 Rate 72 bpm Normal sinus rhythm Septal infarct, age undetermined Abnormal ECG   CV: Echo 01/09/2014 Study Conclusions  - Left ventricle: The cavity size was normal. Systolic function was normal. The estimated ejection fraction was in the range of 55% to 60%. Wall motion was normal; there were no regional wall motion abnormalities. - Atrial septum: There was increased thickness of the septum, consistent with lipomatous hypertrophy. Past Medical History:  Diagnosis Date  . AAA (abdominal aortic aneurysm) ([place]) 04/16/2018   4.3 cm, noted on [place]   . Acute kidney failure ([place]) 2005 approx   after ureteroscopy, hospitalized for about 10 days afterwards  . Anxiety   . Arthritis    Hands, wrist, elbow  .  Bulging lumbar disc   . CAD (coronary artery disease)    a. 01/09/14: [place] s/p DES to RCA   . COPD (chronic obstructive pulmonary disease) ([place])   . Glaucoma of both eyes   . Hepatic cyst 2013   Several tiny hepatic cysts , noted on CT  . History of bilateral inguinal hernias   . History of kidney stones   . Hyperlipidemia   .  Interstitial lung disease ([place])   . Leg pain    ABIs (10/15):  R 1.3, L 1.2, normal  . Pneumonia   . Pulmonary nodule, right 08/02/2016   a. Right upper lobe  . Right ureteral stone   . Skin cancer    Nose  . 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 Aortosclerosis, bilateral - mild with calcification. Per Dr. Moreen Fowler at [facility]   . Tobacco abuse   . Wears dentures    upper   . Wears glasses     Past Surgical History:  Procedure Laterality Date  . CATARACT EXTRACTION W/ INTRAOCULAR LENS  IMPLANT, BILATERAL  1997 (APPROX)  . CYSTOSCOPY/RETROGRADE/URETEROSCOPY/STONE EXTRACTION WITH BASKET  10/17/2011   Procedure: CYSTOSCOPY/RETROGRADE/URETEROSCOPY/STONE EXTRACTION WITH BASKET;  Surgeon: Hanley Ben, MD;  Location: [place];  Service: Urology;  Laterality: Right;  . LAPAROSCOPIC LEFT INGUINAL (OBTURATOR) HERNIA AND UMBILICAL HERNIA REPAIR  01-13-2011  . LEFT HEART CATHETERIZATION WITH CORONARY ANGIOGRAM N/A 01/09/2014   Procedure: LEFT HEART CATHETERIZATION WITH CORONARY ANGIOGRAM;  Surgeon: Josue Hector, MD;  Location: [facility] CATH LAB;  Service: Cardiovascular;  Laterality: N/A;  . LEFT URETEROSCOPIC STONE EXTRACTION  11-30-2000   W/ PREVIOUS ESWL AND URETERAL STENT PLACEMENT  . REPAIR RIGHT INGUINAL HERNIA W/ MESH  12-31-2000  . URETEROLITHOTOMY  2009    MEDICATIONS: No current facility-administered medications for this encounter.    Marland Kitchen aspirin 81 MG tablet  . atenolol (TENORMIN) 25 MG tablet  . atorvastatin (LIPITOR) 40 MG tablet  . brimonidine (ALPHAGAN P) 0.1 % SOLN  . clopidogrel (PLAVIX) 75 MG tablet  . Coenzyme Q10 (CO Q 10) 100 MG CAPS  . dorzolamide-timolol (COSOPT) 22.3-6.8 MG/ML ophthalmic solution  . HYDROcodone-acetaminophen (NORCO) 5-325 MG per tablet  . LORazepam (ATIVAN) 0.5 MG tablet  . Multiple Vitamins-Minerals (ICAPS AREDS FORMULA PO)  . nitroGLYCERIN (NITROSTAT) 0.4 MG SL tablet  . OMEGA-3 KRILL OIL PO  . tiotropium (SPIRIVA) 18 MCG  inhalation capsule  . traZODone (DESYREL) 100 MG tablet    Maia Plan [facility]-Surgical Testing [phone] 05/08/18 2:03 PM

## 2018-05-12 NOTE — H&P (Signed)
HPI: Connor Cuevas is a 78 year-old male with a right ureteral calculus.  The problem is on the right side. This is not his first kidney stone. He is currently having flank pain and groin pain. He denies having back pain, nausea, vomiting, fever, and chills. He has not caught a stone in his urine strainer since his symptoms began.   He has had eswl and ureteroscopy for treatment of his stones in the past.   Patient with bilateral nonobstructing nephrolithiasis. Largest stone is 1.1 cm in the right lower pole. Patient not interested in surgery at this time. He has been monitoring their size for a number of years now. Recently passed stone.   02/26/18: He reports that he has been experiencing intermittent right flank pain with radiation into the groin. It is not associated with any hematuria. He has a known history of a right renal calculus. It occurs intermittently. He has noted some slight increased frequency and some mild dysuria.   04/16/17: He came in with his wife today. He indicates that he is bothered by pain radiating into his groin. He said he does have some flank pain but he said it is not as bad as the groin discomfort. He has not seen a stone pass. He has no new voiding symptoms. He denies hematuria or fever.     ALLERGIES: Macrobid CAPS Macrodantin CAPS    MEDICATIONS: Levaquin 500 mg tablet  Tamsulosin Hcl 0.4 mg capsule 1 capsule PO Q PM  12 Hour Decongestant 120 mg tablet, extended release Oral  Alphagan P 0.1 % drops Ophthalmic  Clopidogrel 75 mg tablet Oral  Crestor 20 mg tablet Oral  Daily Vitamin tablet Oral  Fluticasone Propionate 50 mcg/actuation spray, suspension Nasal  Hydrocodone-Acetaminophen 5 mg-325 mg tablet Oral  Latanoprost 0.005 % drops Ophthalmic  Lorazepam 0.5 mg tablet 0 Oral  Spiriva 18 mcg capsule, with inhalation device Inhalation  Trazodone Hcl 50 mg tablet Oral  Vitamin C TABS Oral  Xalatan 0.005 % drops Ophthalmic     GU PSH: Cystoscopy And  Treatment - 2013 ESWL - 2013, 2008 Rpr Umbil Hern; Reduc < 5 Yr - 2013 Ureteroscopic stone removal - 2013, 2013      [place] Notes: Cath Stent Placement, Cystoscopy With Removal Of Ureteral Calculus Right, Lithotripsy, Umbilical Hernia Repair, Cystoscopy With Ureteroscopy With Manipulation Of Calculus, Cystoscopy With Ureteroscopy With Manipulation Of Calculus, Inguinal Hernia Repair, Lithotripsy, Hernia Repair   NON-GU PSH: Hernia Repair - 2008    GU PMH: Renal calculus (Stable), Bilateral, He has bilateral renal calculi that are nonobstructing. - 02/26/2018, - 2017, Bilateral kidney stones, - 2017 Renal cyst, Left, I noted what appears to be a simple cyst of the left kidney on his CT scan today. - 02/26/2018 Ureteral calculus, Left, He has a 7 mm right ureteral stone. We discussed medical expulsive therapy which I think has a fairly low probability of success but he has obligations and wanted to try that for the next couple of weeks so he will return for a KUB and we will proceed with ureteroscopy if we his stone has not passed. - 02/26/2018, Calculus of distal right ureter, - 2014, Calculus of right ureter, - 2014 BPH w/o LUTS - 2017 Encounter for Prostate Cancer screening - 2017 Pelvic/perineal pain - 2017, - 2017 Gross hematuria, Gross hematuria - 2017 BPH w/LUTS, Benign localized prostatic hyperplasia with lower urinary tract symptoms (LUTS) - 2015 Chronic Kidney Disease, Chronic Renal Failure - 2014 History of urolithiasis, Nephrolithiasis -  2014 Kidney Failure Unspec, Renal Failure - 2014      PMH Notes: Calculus disease: In 8/02 he was noted to have a "densely calcified" stone in the left ureter by Dr. Reece Agar. At that time he underwent stent placement.  9/02 ESWL 2 with incomplete fragmentation requiring ureteroscopy and laser lithotripsy.  7/13 right ureteroscopy and stone extraction by Dr. Tammi Klippel analysis 9/02: Calcium oxalate monohydrate  Stone analysis 7/13: Calcium  oxalate dihydrate and monohydrate.  CT scan 6/17 bilateral renal calculi.       NON-GU PMH: Encounter for general adult medical examination without abnormal findings, Encounter for preventive health examination - 2017 Myocardial Infarction, History of acute myocardial infarction - 2017 Anxiety, Anxiety - 2014 Personal history of other diseases of the digestive system, History of esophageal reflux - 2014 Personal history of other diseases of the nervous system and sense organs, History of glaucoma - 2014, History of glaucoma, - 2014 Personal history of other endocrine, nutritional and metabolic disease, History of hypercholesterolemia - 2014    FAMILY HISTORY: Family Health Status Number - Runs In Family Father Deceased At Age1 ___ - Runs In [place] In Family Hypertension - Mother Mother Deceased At Age 63 from diabetic complicati - Runs In Family Urologic Disorder - Daughter   SOCIAL HISTORY: Marital Status: Married Preferred Language: English; Ethnicity: Not Hispanic Or Latino; Race: White Current Smoking Status: Patient does not smoke anymore. Has not smoked since 04/04/2015. Smoked for 55 years. Smoked 1 pack per day.  Has never drank.  Drinks 3 caffeinated drinks per day.     Notes: Current every day smoker, Tobacco use, Alcohol Use, Caffeine Use, Death In The Family Father, Caffeine Use, Death In The Family Mother, Tobacco Use, Marital History - Currently Married   REVIEW OF SYSTEMS:    GU Review Male:   Patient denies frequent urination, hard to postpone urination, burning/ pain with urination, get up at night to urinate, leakage of urine, stream starts and stops, trouble starting your stream, have to strain to urinate , erection problems, and penile pain.  Gastrointestinal (Upper):   Patient denies nausea, vomiting, and indigestion/ heartburn.  Gastrointestinal (Lower):   Patient denies diarrhea and constipation.  Constitutional:   Patient denies fever,  night sweats, weight loss, and fatigue.  Skin:   Patient denies skin rash/ lesion and itching.  Eyes:   Patient denies blurred vision and double vision.  Ears/ Nose/ Throat:   Patient denies sore throat and sinus problems.  Hematologic/Lymphatic:   Patient denies swollen glands and easy bruising.  Cardiovascular:   Patient denies chest pains and leg swelling.  Respiratory:   Patient denies cough and shortness of breath.  Endocrine:   Patient denies excessive thirst.  Musculoskeletal:   Patient reports back pain. Patient denies joint pain.  Neurological:   Patient denies headaches and dizziness.  Psychologic:   Patient denies depression and anxiety.   VITAL SIGNS:    Weight 138 lb / 62.6 kg  Height 69 in / 175.26 cm  BP 135/83 mmHg  Pulse 96 /min  BMI 20.4 kg/m   GU PHYSICAL EXAMINATION:    Scrotum: No lesions. No edema. No cysts. No warts.   Epididymides: Right: No spermatocele, no masses, no cysts, no tenderness, no induration, no enlargement. Left: No spermatocele, no masses, no cysts, no tenderness, no induration, no enlargement.   Testes: No tenderness, no swelling, no enlargement left testis. No tenderness, no swelling, no enlargement right testis. Normal  location left testis. Normal location right testis. No mass, no cyst, no varicocele, no hydrocele left testis. No mass, no cyst, no varicocele, no hydrocele right testis.   Prostate: Prostate 2 + size. Left lobe normal consistency, right lobe normal consistency. Symmetrical lobes. No prostate nodule. Left lobe no tenderness, right lobe no tenderness.    MULTI-SYSTEM PHYSICAL EXAMINATION:    Constitutional: Well-nourished. No physical deformities. Normally developed. Good grooming.  Neck: Neck symmetrical, not swollen. Normal tracheal position.  Respiratory: No labored breathing, no use of accessory muscles.   Cardiovascular: Normal temperature, normal extremity pulses, no swelling, no varicosities.  Skin: No paleness, no  jaundice, no cyanosis. No lesion, no ulcer, no rash.  Gastrointestinal: No mass, no tenderness, no rigidity, non obese abdomen.  Eyes: Normal conjunctivae. Normal eyelids.  Ears, Nose, Mouth, and Throat: Left ear no scars, no lesions, no masses. Right ear no scars, no lesions, no masses. Nose no scars, no lesions, no masses. Normal hearing. Normal lips.  Musculoskeletal: Normal gait and station of head and neck.    PAST DATA REVIEWED:  Source Of History:  Patient  Records Review:   Previous Patient Records, POC Tool  X-Ray Review: C.T. Stone Protocol: Reviewed Films. Previous CT scan images were reviewed and compared to today's study.    10/11/15 07/09/13 05/14/06 07/21/02  PSA  Total PSA 2.10  1.59  1.42  1.02     07/21/02  Hormones  Testosterone, Total 1.84     PROCEDURES:         KUB - 74018  A single view of the abdomen is obtained.  Independent review of his KUB today reveals a density overlying the sacrum which most likely is his right ureteral stone.         Urinalysis w/Scope - 81001 Dipstick Dipstick Cont'd Micro  Color: Yellow Bilirubin: Neg WBC/hpf: 0 - 5/hpf  Appearance: Cloudy Ketones: Neg RBC/hpf: >60/hpf  Specific Gravity: 1.025 Blood: 3+ Bacteria: NS (Not Seen)  pH: 5.5 Protein: 1+ Cystals: NS (Not Seen)  Glucose: Neg Urobilinogen: 0.2 Casts: NS (Not Seen)    Nitrites: Neg Trichomonas: Not Present    Leukocyte Esterase: Trace Mucous: Present      Epithelial Cells: 0 - 5/hpf      Yeast: NS (Not Seen)      Sperm: Not Present      ASSESSMENT/PLAN:      ICD-10 Details  1 GU:   Ureteral calculus - N20.1 Right, Stable - He there has been some slight progression of his right ureteral stone but he remains symptomatic and it looks like it has now moved over the sacrum. It has Hounsfield units of 1200 and he failed lithotripsy in the past therefore I have recommended proceeding with ureteroscopic management of his stone. He takes Plavix and will stop this 5 days  prior to the procedure as he had done last year for a dental procedure.   2   Renal calculus - N20.0 Bilateral, Stable - His bilateral renal calculi remain unchanged.

## 2018-05-13 ENCOUNTER — Ambulatory Visit (HOSPITAL_BASED_OUTPATIENT_CLINIC_OR_DEPARTMENT_OTHER): Payer: Medicare Other | Admitting: Physician Assistant

## 2018-05-13 ENCOUNTER — Ambulatory Visit (HOSPITAL_COMMUNITY): Payer: Medicare Other

## 2018-05-13 ENCOUNTER — Other Ambulatory Visit: Payer: Self-pay

## 2018-05-13 ENCOUNTER — Encounter (HOSPITAL_BASED_OUTPATIENT_CLINIC_OR_DEPARTMENT_OTHER)
Admission: RE | Disposition: A | Discharge status: Home / Self Care | Payer: Self-pay | Source: Ambulatory Visit | Attending: Urology

## 2018-05-13 ENCOUNTER — Encounter (HOSPITAL_BASED_OUTPATIENT_CLINIC_OR_DEPARTMENT_OTHER): Payer: Self-pay | Admitting: *Deleted

## 2018-05-13 ENCOUNTER — Ambulatory Visit (HOSPITAL_BASED_OUTPATIENT_CLINIC_OR_DEPARTMENT_OTHER)
Admission: RE | Admit: 2018-05-13 | Discharge: 2018-05-13 | Disposition: A | Discharge status: Home / Self Care | Payer: Medicare Other | Source: Ambulatory Visit | Attending: Urology | Admitting: Urology

## 2018-05-13 DIAGNOSIS — Z792 Long term (current) use of antibiotics: Secondary | ICD-10-CM | POA: Diagnosis not present

## 2018-05-13 DIAGNOSIS — H409 Unspecified glaucoma: Secondary | ICD-10-CM | POA: Diagnosis not present

## 2018-05-13 DIAGNOSIS — I2510 Atherosclerotic heart disease of native coronary artery without angina pectoris: Secondary | ICD-10-CM | POA: Insufficient documentation

## 2018-05-13 DIAGNOSIS — F419 Anxiety disorder, unspecified: Secondary | ICD-10-CM | POA: Diagnosis not present

## 2018-05-13 DIAGNOSIS — N202 Calculus of kidney with calculus of ureter: Secondary | ICD-10-CM | POA: Diagnosis not present

## 2018-05-13 DIAGNOSIS — I252 Old myocardial infarction: Secondary | ICD-10-CM | POA: Diagnosis not present

## 2018-05-13 DIAGNOSIS — J449 Chronic obstructive pulmonary disease, unspecified: Secondary | ICD-10-CM | POA: Diagnosis not present

## 2018-05-13 DIAGNOSIS — E785 Hyperlipidemia, unspecified: Secondary | ICD-10-CM | POA: Diagnosis not present

## 2018-05-13 DIAGNOSIS — J849 Interstitial pulmonary disease, unspecified: Secondary | ICD-10-CM | POA: Insufficient documentation

## 2018-05-13 DIAGNOSIS — Z881 Allergy status to other antibiotic agents status: Secondary | ICD-10-CM | POA: Insufficient documentation

## 2018-05-13 DIAGNOSIS — Z955 Presence of coronary angioplasty implant and graft: Secondary | ICD-10-CM | POA: Insufficient documentation

## 2018-05-13 DIAGNOSIS — Z7982 Long term (current) use of aspirin: Secondary | ICD-10-CM | POA: Insufficient documentation

## 2018-05-13 DIAGNOSIS — Z79891 Long term (current) use of opiate analgesic: Secondary | ICD-10-CM | POA: Insufficient documentation

## 2018-05-13 DIAGNOSIS — Z79899 Other long term (current) drug therapy: Secondary | ICD-10-CM | POA: Insufficient documentation

## 2018-05-13 DIAGNOSIS — I714 Abdominal aortic aneurysm, without rupture: Secondary | ICD-10-CM | POA: Diagnosis not present

## 2018-05-13 DIAGNOSIS — N201 Calculus of ureter: Secondary | ICD-10-CM

## 2018-05-13 DIAGNOSIS — Z7902 Long term (current) use of antithrombotics/antiplatelets: Secondary | ICD-10-CM | POA: Insufficient documentation

## 2018-05-13 DIAGNOSIS — Z87891 Personal history of nicotine dependence: Secondary | ICD-10-CM | POA: Insufficient documentation

## 2018-05-13 DIAGNOSIS — Z87442 Personal history of urinary calculi: Secondary | ICD-10-CM | POA: Insufficient documentation

## 2018-05-13 HISTORY — DX: Presence of dental prosthetic device (complete) (partial): Z97.2

## 2018-05-13 HISTORY — DX: Presence of spectacles and contact lenses: Z97.3

## 2018-05-13 HISTORY — DX: Other intervertebral disc degeneration, lumbar region without mention of lumbar back pain or lower extremity pain: M51.369

## 2018-05-13 HISTORY — DX: Personal history of other diseases of the digestive system: Z87.19

## 2018-05-13 HISTORY — PX: CYSTOSCOPY/URETEROSCOPY/HOLMIUM LASER/STENT PLACEMENT: SHX6546

## 2018-05-13 HISTORY — DX: Other intervertebral disc displacement, lumbar region: M51.26

## 2018-05-13 HISTORY — DX: Unspecified malignant neoplasm of skin, unspecified: C44.90

## 2018-05-13 HISTORY — DX: Other intervertebral disc degeneration, lumbar region: M51.36

## 2018-05-13 HISTORY — DX: Acute kidney failure, unspecified: N17.9

## 2018-05-13 HISTORY — DX: Other specified diseases of liver: K76.89

## 2018-05-13 HISTORY — DX: Pneumonia, unspecified organism: J18.9

## 2018-05-13 LAB — POCT I-STAT 4, (NA,K, GLUC, HGB,HCT)
Glucose, Bld: 88 mg/dL (ref 70–99)
HCT: 44 % (ref 39.0–52.0)
Hemoglobin: 15 g/dL (ref 13.0–17.0)
Potassium: 4.3 mmol/L (ref 3.5–5.1)
Sodium: 141 mmol/L (ref 135–145)

## 2018-05-13 IMAGING — CR DG ABDOMEN 1V
2 series · 2 of 2 positions shown · non-contrast
Comparison: [DATE].  CT [DATE]

CLINICAL DATA: Right ureter stone.  Preop for right kidney stones.

EXAM:
ABDOMEN - 1 VIEW

[t abdomen supine (1 of 2)]
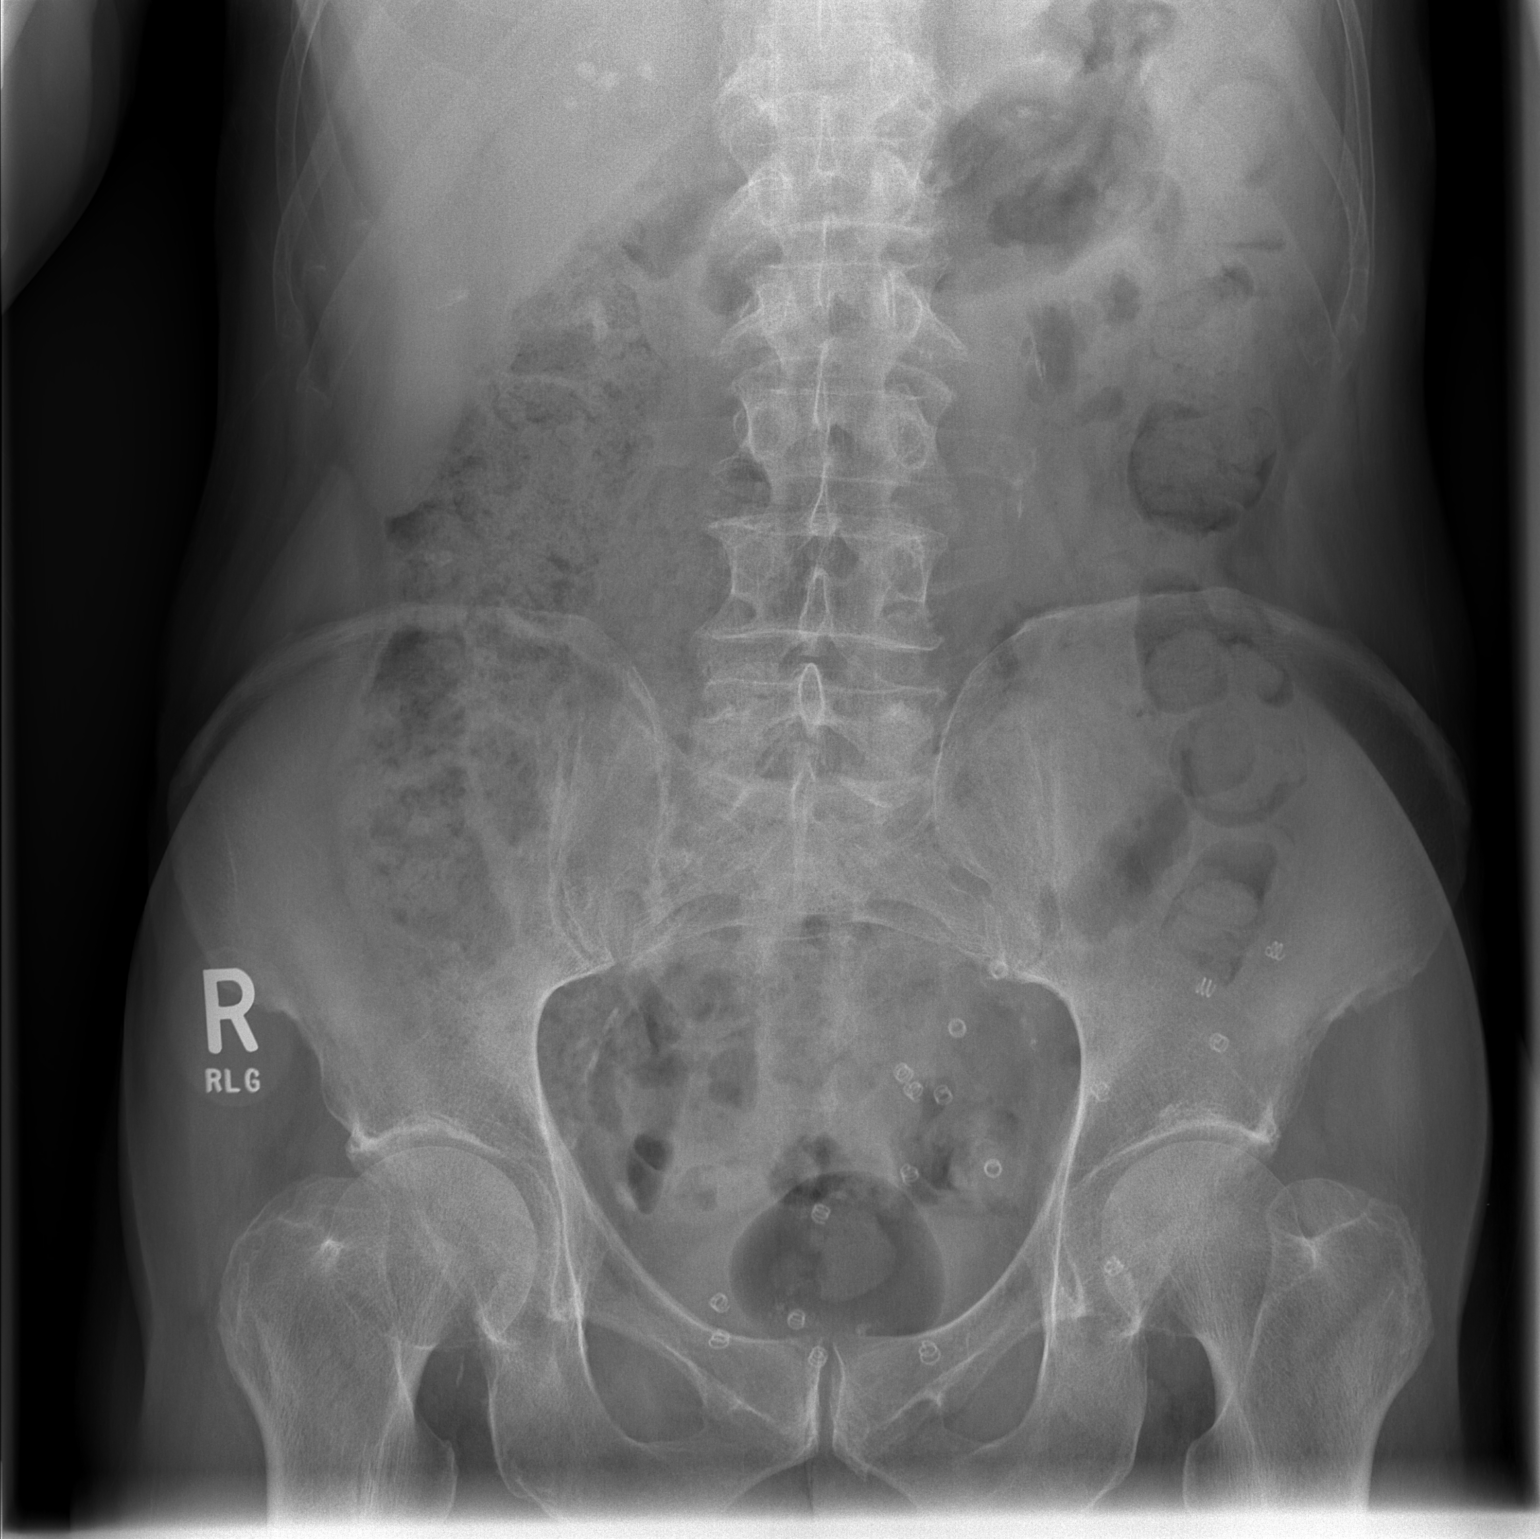

[t abdomen supine (2 of 2)]
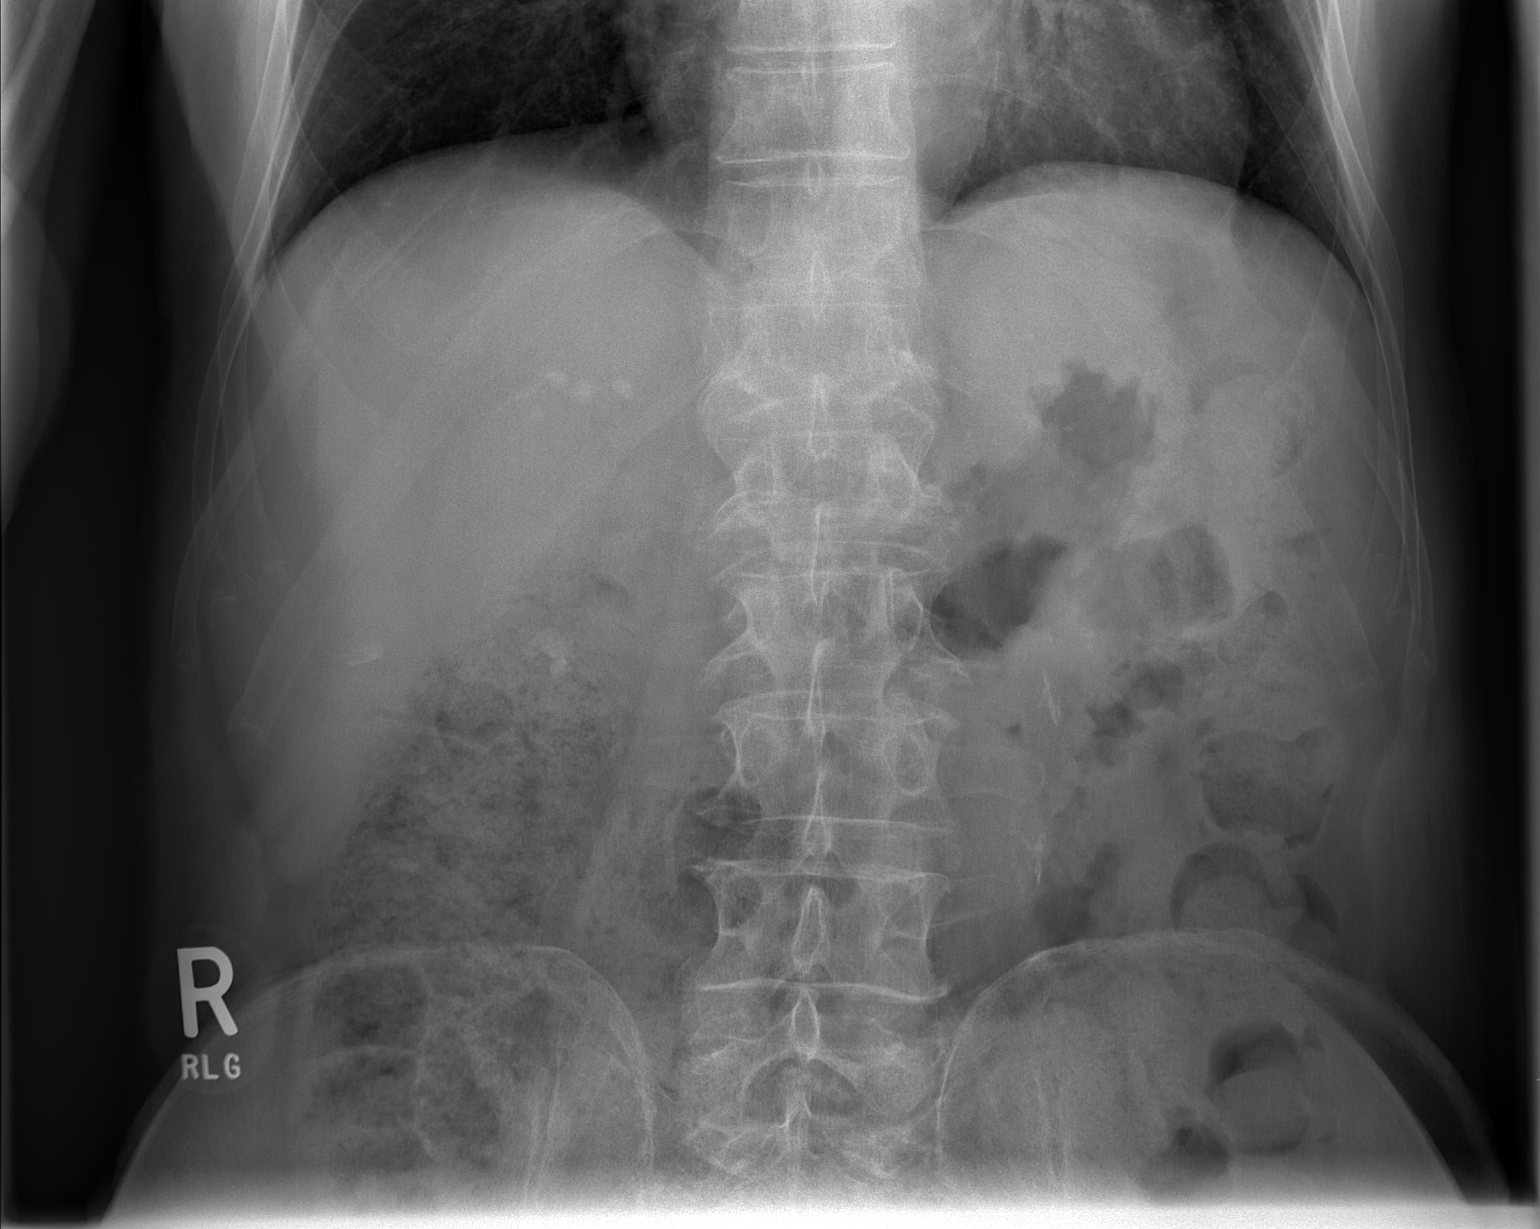

[2 of 2 positions shown; findings below may reference images not displayed]

FINDINGS: Multiple large calcifications in the right kidney compatible with
stones. Question a 1 cm stone in the right hemipelvis which could
represent stone in the distal right ureter. Known left renal calculi
are poorly characterized on this examination. Nonobstructive bowel
gas pattern with moderate stool burden. Patient has surgical mesh
material in the pelvis. Atherosclerotic calcifications in the aorta.
IMPRESSION: 1. Bilateral nephrolithiasis.
2. Possible 1 cm stone in the distal right ureter.

## 2018-05-13 SURGERY — CYSTOSCOPY/URETEROSCOPY/HOLMIUM LASER/STENT PLACEMENT
Anesthesia: General | Site: Ureter | Laterality: Right

## 2018-05-13 MED ORDER — ONDANSETRON HCL 4 MG/2ML IJ SOLN
INTRAMUSCULAR | Status: DC | PRN
  Administered 2018-05-13: 4 mg via INTRAVENOUS

## 2018-05-13 MED ORDER — CIPROFLOXACIN IN D5W 400 MG/200ML IV SOLN
INTRAVENOUS | Status: AC
  Filled 2018-05-13: qty 200

## 2018-05-13 MED ORDER — KETOROLAC TROMETHAMINE 30 MG/ML IJ SOLN
INTRAMUSCULAR | Status: AC
  Filled 2018-05-13: qty 1

## 2018-05-13 MED ORDER — IOHEXOL 300 MG/ML  SOLN
INTRAMUSCULAR | Status: DC | PRN
  Administered 2018-05-13: 20 mL

## 2018-05-13 MED ORDER — DEXAMETHASONE SODIUM PHOSPHATE 10 MG/ML IJ SOLN
INTRAMUSCULAR | Status: AC
  Filled 2018-05-13: qty 1

## 2018-05-13 MED ORDER — FENTANYL CITRATE (PF) 100 MCG/2ML IJ SOLN
INTRAMUSCULAR | Status: AC
  Filled 2018-05-13: qty 2

## 2018-05-13 MED ORDER — LACTATED RINGERS IV SOLN
INTRAVENOUS | Status: DC
  Administered 2018-05-13: 08:00:00 via INTRAVENOUS
  Filled 2018-05-13: qty 1000

## 2018-05-13 MED ORDER — LIDOCAINE 2% (20 MG/ML) 5 ML SYRINGE
INTRAMUSCULAR | Status: DC | PRN
  Administered 2018-05-13: 50 mg via INTRAVENOUS

## 2018-05-13 MED ORDER — MIDAZOLAM HCL 2 MG/2ML IJ SOLN
INTRAMUSCULAR | Status: AC
  Filled 2018-05-13: qty 2

## 2018-05-13 MED ORDER — DEXAMETHASONE SODIUM PHOSPHATE 10 MG/ML IJ SOLN
INTRAMUSCULAR | Status: DC | PRN
  Administered 2018-05-13: 10 mg via INTRAVENOUS

## 2018-05-13 MED ORDER — KETOROLAC TROMETHAMINE 15 MG/ML IJ SOLN
INTRAMUSCULAR | Status: DC | PRN
  Administered 2018-05-13: 15 mg via INTRAVENOUS

## 2018-05-13 MED ORDER — FENTANYL CITRATE (PF) 100 MCG/2ML IJ SOLN
INTRAMUSCULAR | Status: DC | PRN
  Administered 2018-05-13 (×2): 25 ug via INTRAVENOUS
  Administered 2018-05-13: 50 ug via INTRAVENOUS

## 2018-05-13 MED ORDER — PROPOFOL 10 MG/ML IV BOLUS
INTRAVENOUS | Status: AC
  Filled 2018-05-13: qty 20

## 2018-05-13 MED ORDER — CIPROFLOXACIN IN D5W 400 MG/200ML IV SOLN
400.0000 mg | Freq: Once | INTRAVENOUS | Status: AC
  Administered 2018-05-13: 400 mg via INTRAVENOUS
  Filled 2018-05-13: qty 200

## 2018-05-13 MED ORDER — HYDROCODONE-ACETAMINOPHEN 10-325 MG PO TABS: 1.0000 | ORAL_TABLET | ORAL | 0 refills | Status: DC | PRN

## 2018-05-13 MED ORDER — PHENAZOPYRIDINE HCL 200 MG PO TABS: 200.0000 mg | ORAL_TABLET | Freq: Three times a day (TID) | ORAL | 0 refills | Status: DC | PRN

## 2018-05-13 MED ORDER — OXYCODONE HCL 5 MG PO TABS
5.0000 mg | ORAL_TABLET | Freq: Once | ORAL | Status: DC | PRN
  Filled 2018-05-13: qty 1

## 2018-05-13 MED ORDER — OXYCODONE HCL 5 MG/5ML PO SOLN
5.0000 mg | Freq: Once | ORAL | Status: DC | PRN
  Filled 2018-05-13: qty 5

## 2018-05-13 MED ORDER — SODIUM CHLORIDE 0.9 % IR SOLN
Status: DC | PRN
  Administered 2018-05-13: 1 via INTRAVESICAL

## 2018-05-13 MED ORDER — EPHEDRINE 5 MG/ML INJ
INTRAVENOUS | Status: AC
  Filled 2018-05-13: qty 10

## 2018-05-13 MED ORDER — EPHEDRINE SULFATE-NACL 50-0.9 MG/10ML-% IV SOSY
PREFILLED_SYRINGE | INTRAVENOUS | Status: DC | PRN
  Administered 2018-05-13 (×3): 10 mg via INTRAVENOUS

## 2018-05-13 MED ORDER — ONDANSETRON HCL 4 MG/2ML IJ SOLN
INTRAMUSCULAR | Status: AC
  Filled 2018-05-13: qty 2

## 2018-05-13 MED ORDER — PROPOFOL 10 MG/ML IV BOLUS
INTRAVENOUS | Status: DC | PRN
  Administered 2018-05-13: 150 mg via INTRAVENOUS

## 2018-05-13 MED ORDER — LIDOCAINE 2% (20 MG/ML) 5 ML SYRINGE
INTRAMUSCULAR | Status: AC
  Filled 2018-05-13: qty 5

## 2018-05-13 MED ORDER — HYDROMORPHONE HCL 1 MG/ML IJ SOLN
0.2500 mg | INTRAMUSCULAR | Status: DC | PRN
  Filled 2018-05-13: qty 0.5

## 2018-05-13 MED ORDER — PROMETHAZINE HCL 25 MG/ML IJ SOLN
6.2500 mg | INTRAMUSCULAR | Status: DC | PRN
  Filled 2018-05-13: qty 1

## 2018-05-13 SURGICAL SUPPLY — 26 items
BAG DRAIN URO-CYSTO SKYTR STRL (DRAIN) ×2 IMPLANT
BAG DRN UROCATH (DRAIN) ×1
BASKET ZERO TIP NITINOL 2.4FR (BASKET) ×1 IMPLANT
BSKT STON RTRVL ZERO TP 2.4FR (BASKET) ×1
CATH INTERMIT  6FR 70CM (CATHETERS) ×1 IMPLANT
CATH URET 5FR 28IN CONE TIP (BALLOONS) ×1
CATH URET 5FR 70CM CONE TIP (BALLOONS) IMPLANT
CLOTH BEACON ORANGE TIMEOUT ST (SAFETY) ×2 IMPLANT
EXTRACTOR STONE 1.7FRX115CM (UROLOGICAL SUPPLIES) IMPLANT
FIBER LASER FLEXIVA 365 (UROLOGICAL SUPPLIES) IMPLANT
FIBER LASER TRAC TIP (UROLOGICAL SUPPLIES) ×1 IMPLANT
GLOVE BIO SURGEON STRL SZ8 (GLOVE) ×2 IMPLANT
GOWN STRL REUS W/TWL XL LVL3 (GOWN DISPOSABLE) ×2 IMPLANT
GUIDEWIRE ANG ZIPWIRE 038X150 (WIRE) ×1 IMPLANT
GUIDEWIRE STR DUAL SENSOR (WIRE) ×2 IMPLANT
INFUSOR MANOMETER BAG 3000ML (MISCELLANEOUS) ×2 IMPLANT
IV NS IRRIG 3000ML ARTHROMATIC (IV SOLUTION) ×4 IMPLANT
KIT TURNOVER CYSTO (KITS) ×2 IMPLANT
MANIFOLD NEPTUNE II (INSTRUMENTS) ×1 IMPLANT
NS IRRIG 500ML POUR BTL (IV SOLUTION) ×2 IMPLANT
PACK CYSTO (CUSTOM PROCEDURE TRAY) ×2 IMPLANT
STENT POLARIS LOOP 6FR X 24 CM (STENTS) ×1 IMPLANT
SYRINGE IRR TOOMEY STRL 70CC (SYRINGE) ×1 IMPLANT
TUBE CONNECTING 12X1/4 (SUCTIONS) ×1 IMPLANT
TUBING UROLOGY SET (TUBING) ×1 IMPLANT
WATER STERILE IRR 3000ML UROMA (IV SOLUTION) ×1 IMPLANT

## 2018-05-13 NOTE — Discharge Instructions (Signed)

## 2018-05-13 NOTE — Transfer of Care (Signed)
Immediate Anesthesia Transfer of Care Note  Patient: CHANCE MUNTER  Procedure(s) Performed: CYSTOSCOPY/RETROGRADE/URETEROSCOPY/HOLMIUM LASER (Right Ureter)  Patient Location: PACU  Anesthesia Type:General  Level of Consciousness: sedated, patient cooperative and responds to stimulation  Airway & Oxygen Therapy: Patient Spontanous Breathing and Patient connected to nasal cannula oxygen  Post-op Assessment: Report given to RN and Post -op Vital signs reviewed and stable  Post vital signs: Reviewed and stable  Last Vitals:  Vitals Value Taken Time  BP 141/86 05/13/2018  9:11 AM  Temp    Pulse 72 05/13/2018  9:12 AM  Resp    SpO2 100 % 05/13/2018  9:12 AM  Vitals shown include unvalidated device data.  Last Pain:  Vitals:   05/13/18 0811  TempSrc: Oral  PainSc: 0-No pain      Patients Stated Pain Goal: 6 (62/44/69 5072)  Complications: No apparent anesthesia complications

## 2018-05-13 NOTE — Op Note (Signed)
PATIENT:  Connor Cuevas  PRE-OPERATIVE DIAGNOSIS:  right Ureteral calculus  POST-OPERATIVE DIAGNOSIS: Same  PROCEDURE:  1. Cystoscopy with right retrograde pyelogram including interpretation. 2. Right ureteroscopy, laser lithotripsy, stone extraction and stent placement. 3. Fluoroscopy time less than 1 hour  SURGEON: Claybon Jabs, MD  INDICATION: Mr. Life is a 78 year old male with a known right ureteral calculus.  He began experiencing right flank pain with radiation into the groin in 11/19.  He was placed on medical expulsive therapy but has not passed his stone and continues to have intermittent discomfort we therefore discussed management of his stone which has Hounsfield units of 1100.  ANESTHESIA:  General  EBL:  Minimal  DRAINS: 6 French, 24 cm Polaris stent in the right ureter (with string)  SPECIMEN: Stone given the patient  DESCRIPTION OF PROCEDURE: The patient was taken to the major OR and placed on the table. General anesthesia was administered and then the patient was moved to the dorsal lithotomy position. The genitalia was sterilely prepped and draped. An official timeout was performed.  Initially the 87 French cystoscope with 30 lens was passed under direct vision into the bladder. The bladder was then fully inspected. It was noted be free of any tumors, stones or inflammatory lesions. Ureteral orifices were of normal configuration and position. A 6 French open-ended ureteral catheter was then passed through the cystoscope into the ureteral orifice in order to perform a right retrograde pyelogram.  A retrograde pyelogram was performed by injecting full-strength contrast up the right ureter under direct fluoroscopic control. It revealed a filling defect in the distal ureter consistent with the stone seen on the preoperative KUB. The remainder of the ureter was noted to be normal as was the intrarenal collecting system. I then passed a 0.038 inch floppy-tipped guidewire  through the open ended catheter and into the area of the renal pelvis and this was left in place. The inner portion of a ureteral access sheath was then passed over the guidewire to gently dilate the intramural ureter. I then proceeded with ureteroscopy.  A 6 French rigid ureteroscope was then passed under direct into the bladder and into the right orifice and up the ureter. The stone was identified and I felt it was too large to extract and therefore elected to proceed with laser lithotripsy. The 200  holmium laser fiber was used to fragment the stone. I then used the nitinol basket to extract all of the stone fragments and reinspection of the ureter ureteroscopically revealed no further stone fragments and no injury to the ureter. I then backloaded the cystoscope over the guidewire and passed the stent over the guidewire into the area of the renal pelvis. As the guidewire was removed good curl was noted in the renal pelvis. The bladder was drained and the cystoscope was then removed. The patient tolerated the procedure well no intraoperative complications.  PLAN OF CARE: Discharge to home after PACU  PATIENT DISPOSITION:  PACU - hemodynamically stable.

## 2018-05-13 NOTE — Anesthesia Postprocedure Evaluation (Signed)
Anesthesia Post Note  Patient: Connor Cuevas  Procedure(s) Performed: CYSTOSCOPY/RETROGRADE/URETEROSCOPY/HOLMIUM LASER (Right Ureter)     Patient location during evaluation: PACU Anesthesia Type: General Level of consciousness: awake and alert Pain management: pain level controlled Vital Signs Assessment: post-procedure vital signs reviewed and stable Respiratory status: spontaneous breathing, nonlabored ventilation and respiratory function stable Cardiovascular status: blood pressure returned to baseline and stable Postop Assessment: no apparent nausea or vomiting Anesthetic complications: no    Last Vitals:  Vitals:   05/13/18 0930 05/13/18 0945  BP: (!) 147/72 (!) 145/80  Pulse: 71 70  Resp: 16 18  Temp:    SpO2: 97% 97%    Last Pain:  Vitals:   05/13/18 0945  TempSrc:   PainSc: 0-No pain                 Lynda Rainwater

## 2018-05-13 NOTE — Anesthesia Procedure Notes (Signed)
Procedure Name: LMA Insertion Date/Time: 05/13/2018 8:21 AM Performed by: Suan Halter, CRNA Pre-anesthesia Checklist: Patient identified, Emergency Drugs available, Suction available and Patient being monitored Patient Re-evaluated:Patient Re-evaluated prior to induction Oxygen Delivery Method: Circle system utilized Preoxygenation: Pre-oxygenation with 100% oxygen Induction Type: IV induction Ventilation: Mask ventilation without difficulty LMA: LMA inserted LMA Size: 4.0 Number of attempts: 1 Airway Equipment and Method: Bite block Placement Confirmation: positive ETCO2 Tube secured with: Tape Dental Injury: Teeth and Oropharynx as per pre-operative assessment

## 2018-05-14 ENCOUNTER — Encounter (HOSPITAL_BASED_OUTPATIENT_CLINIC_OR_DEPARTMENT_OTHER): Payer: Self-pay | Admitting: Urology

## 2018-06-03 ENCOUNTER — Other Ambulatory Visit (HOSPITAL_COMMUNITY): Payer: Medicare Other

## 2018-06-19 ENCOUNTER — Other Ambulatory Visit: Payer: Self-pay | Admitting: Family Medicine

## 2018-06-19 DIAGNOSIS — I714 Abdominal aortic aneurysm, without rupture, unspecified: Secondary | ICD-10-CM

## 2018-08-28 ENCOUNTER — Other Ambulatory Visit: Payer: Self-pay | Admitting: Cardiovascular Disease

## 2018-09-25 ENCOUNTER — Telehealth: Payer: Self-pay | Admitting: *Deleted

## 2018-09-25 NOTE — Telephone Encounter (Signed)
    COVID-19 Pre-Screening Questions:  . In the past 7 to 10 days have you had a cough,  shortness of breath, headache, congestion, fever (100 or greater) body aches, chills, sore throat, or sudden loss of taste or sense of smell?-NO . Have you been around anyone with known Covid 19.-NO . Have you been around anyone who is awaiting Covid 19 test results in the past 7 to 10 days?-NO . Have you been around anyone who has been exposed to Covid 19, or has mentioned symptoms of Covid 19 within the past 7 to 10 days?-NO      Pt covid screening done for upcoming appt with Kathyrn Drown on 6/30 at 1:15 pm.  Pt aware to wear his facial mask during the duration of his visit and he is aware of no visitor policy.  Pt was very gracious for all the assistance.

## 2018-09-27 NOTE — Progress Notes (Signed)
Cardiology Office Note   Date:  09/27/2018   ID:  Dakin, Madani 1940-11-04, MRN [id number]  PCP:  [name], MD  Cardiologist: Dr. [name]   No chief complaint on file.  History of Present Illness: Connor Cuevas is a 78 y.o. male with a hx of COPD, RUL nodule previous smoker, CAD with DES to the mid RCA in October 2015, HLD,  and AAA last measuring 4.3cm per [place] on 04/2018, up from 3.3 on 06/10/2015.   Since last seen by our service, underwent a ureteroscopic procedure 05/13/2018 for bilateral nonobstructing nephrolithiasis. His Plavix was held 5 days prior to procedure. He did well without complication and is now back on his Plavix therapy.   He was last seen by his cardiologist 08/09/2017 and was doing well from a cardiac perspective. He was under more stress secondary to his wife's health.   Today he states that he has been doing great without anginal symptoms. He continues with yard work without chest pain or SOB. He cares for more in- house responsibilities due to his wifes health. He had a repeat AAA [place] in 04/2018 that showed an increase in diameter and this makes him concerned. He denies palpitations, PND, orthopnea, LE swelling, dizziness or syncope. BP is a little elevated today but he reports that it typically runs with SBPs in the 130's at home. He is compliant with his medications. He reports Dr. [name] took him off his ASA for unclear reasons. I will follow up on this. Overall doing well.   Past Medical History:  Diagnosis Date  . AAA (abdominal aortic aneurysm) ([place]) 04/16/2018   4.3 cm, noted on [place]   . Acute kidney failure ([place]) 2005 approx   after ureteroscopy, hospitalized for about 10 days afterwards  . Anxiety   . Arthritis    Hands, wrist, elbow  . Bulging lumbar disc   . CAD (coronary artery disease)    a. 01/09/14: [place] s/p DES to RCA   . COPD (chronic obstructive pulmonary disease) ([place])   . Glaucoma of both eyes   . Hepatic cyst 2013   Several  tiny hepatic cysts , noted on CT  . History of bilateral inguinal hernias   . History of kidney stones   . Hyperlipidemia   . Interstitial lung disease ([place])   . Leg pain    ABIs (10/15):  R 1.3, L 1.2, normal  . Pneumonia   . Pulmonary nodule, right 08/02/2016   a. Right upper lobe  . Right ureteral stone   . Skin cancer    Nose  . 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 Aortosclerosis, bilateral - mild with calcification. Per Dr[facility] Moreen [facility]   . Tobacco abuse   . Wears dentures    upper   . Wears glasses     Past Surgical History:  Procedure Laterality Date  . CATARACT EXTRACTION W/ INTRAOCULAR LENS  IMPLANT, BILATERAL  1997 (APPROX)  . CYSTOSCOPY/RETROGRADE/URETEROSCOPY/STONE EXTRACTION WITH BASKET  10/17/2011   Procedure: CYSTOSCOPY/RETROGRADE/URETEROSCOPY/STONE EXTRACTION WITH BASKET;  Surgeon: Hanley Ben, MD;  Location: [place];  Service: Urology;  Laterality: Right;  . CYSTOSCOPY/URETEROSCOPY/HOLMIUM LASER/STENT PLACEMENT Right 05/13/2018   Procedure: CYSTOSCOPY/RETROGRADE/URETEROSCOPY/HOLMIUM LASER;  Surgeon: Kathie Rhodes, MD;  Location[facility];  Service: Urology;  Laterality: Right;  . LAPAROSCOPIC LEFT INGUINAL (OBTURATOR) HERNIA AND UMBILICAL HERNIA REPAIR  01-13-2011  . LEFT HEART CATHETERIZATION WITH CORONARY ANGIOGRAM N/A 01/09/2014  Procedure: LEFT HEART CATHETERIZATION WITH CORONARY ANGIOGRAM;  Surgeon: Josue Hector, MD;  Location: [facility];  Service: Cardiovascular;  Laterality: N/A;  . LEFT URETEROSCOPIC STONE EXTRACTION  11-30-2000   W/ PREVIOUS ESWL AND URETERAL STENT PLACEMENT  . REPAIR RIGHT INGUINAL HERNIA W/ MESH  12-31-2000  . URETEROLITHOTOMY  2009     Current Outpatient Medications  Medication Sig Dispense Refill  . aspirin 81 MG tablet Take 81 mg by mouth daily.     Marland Kitchen atenolol (TENORMIN) 25 MG tablet Take 1 tablet (25 mg total) by mouth daily. (Patient taking differently: Take 25 mg by  mouth every morning. ) 90 tablet 3  . atorvastatin (LIPITOR) 40 MG tablet Take 40 mg by mouth every morning.     . brimonidine (ALPHAGAN P) 0.1 % SOLN Place 1 drop into the right eye 2 (two) times daily.    . clopidogrel (PLAVIX) 75 MG tablet Take 1 tablet by mouth once daily 90 tablet 0  . Coenzyme Q10 (CO Q 10) 100 MG CAPS Take 100 mg by mouth daily.    . dorzolamide-timolol (COSOPT) 22.3-6.8 MG/ML ophthalmic solution Place 1 drop into both eyes 2 (two) times daily.    Marland Kitchen HYDROcodone-acetaminophen (NORCO) 10-325 MG tablet Take 1-2 tablets by mouth every 4 (four) hours as needed for moderate pain. Maximum dose per 24 hours - 8 pills 20 tablet 0  . HYDROcodone-acetaminophen (NORCO) 5-325 MG per tablet Take 0.5 tablets by mouth every 6 (six) hours as needed for moderate pain. For pain    . LORazepam (ATIVAN) 0.5 MG tablet Take 0.5 mg by mouth daily as needed. For anxiety    . Multiple Vitamins-Minerals (ICAPS AREDS FORMULA PO) Take 1 tablet by mouth daily.    . nitroGLYCERIN (NITROSTAT) 0.4 MG SL tablet Place 1 tablet (0.4 mg total) under the tongue every 5 (five) minutes as needed for chest pain. 25 tablet 12  . OMEGA-3 KRILL OIL PO Take 200 mg by mouth daily.    . phenazopyridine (PYRIDIUM) 200 MG tablet Take 1 tablet (200 mg total) by mouth 3 (three) times daily as needed for pain. 20 tablet 0  . tiotropium (SPIRIVA) 18 MCG inhalation capsule Place 18 mcg into inhaler and inhale every morning.     . traZODone (DESYREL) 100 MG tablet Take 100 mg by mouth at bedtime.      No current facility-administered medications for this visit.     Allergies:   Macrobid [nitrofurantoin]    Social History:  The patient  reports that he quit smoking about 6 months ago. His smoking use included cigarettes. He has a 53.00 pack-year smoking history. He has never used smokeless tobacco. He reports that he does not drink alcohol or use drugs.   Family History:  The patient's family history includes Heart attack  in an other family member; Hypertension in his mother; Stroke in his maternal grandfather, maternal grandmother, paternal grandfather, paternal grandmother, and another family member.    ROS:  Please see the history of present illness.   Otherwise, review of systems are positive for none.   All other systems are reviewed and negative.    PHYSICAL EXAM: VS:  There were no vitals taken for this visit. , BMI There is no height or weight on file to calculate BMI.   General: Well developed, well nourished, NAD Neck: Negative for carotid bruits. No JVD Lungs:Clear to ausculation bilaterally. No wheezes, rales, or rhonchi. Breathing is unlabored. Cardiovascular: RRR with S1 S2. No  murmurs, rubs, gallops, or LV heave appreciated. Extremities: No edema. No clubbing or cyanosis. DP/PT pulses 2+ bilaterally Neuro: Alert and oriented. No focal deficits. No facial asymmetry. MAE spontaneously. Psych: Responds to questions appropriately with normal affect.     EKG:  EKG is ordered today. The ekg ordered today demonstrates NSR with TWI in V1-V2. Large T waves in leads V3-V6, no significant change from prior   Recent Labs: 05/13/2018: Hemoglobin 15.0; Potassium 4.3; Sodium 141    Lipid Panel No results found for: CHOL, TRIG, HDL, CHOLHDL, VLDL, LDLCALC, LDLDIRECT    Wt Readings from Last 3 Encounters:  05/07/18 138 lb (62.6 kg)  04/16/18 137 lb 14.4 oz (62.6 kg)  08/09/17 139 lb (63 kg)    Other studies Reviewed: Additional studies/ records that were reviewed today include:   AAA [place] 04/16/2018: Summary: Abdominal Aorta: There is evidence of abnormal dilitation of the Distal and Mid Abdominal aorta. The largest aortic measurement is 4.3 cm. The largest aortic diameter has increased compared to prior exam. Previous diameter measurement was 3.3 cm obtained on 06/10/2015.  ASSESSMENT AND PLAN:  1. Hx of CAD s/p DES to RAC 2015: -Stable, denies angina  -Continue current regimen with atenolol,  atorvastatin, Plavix -Denies bleeding -Reports not on ASA>>will confer with Dr. [name]  -CBC, Lipid panel and LFTs   2. AAA: -Last [place] with measurement at 3.8cm>>repeat US performed on 04/16/2018 with AAA at 4.3cm>>increased from 3.3 in 2017 -Will need closer monitoring, at least every 1 year from here on out -Continue with good BP control   3. HLD: -Continue statin  -Will obtain lipid panel and LFTS today   4. COPD: -Has known RUL nodule  -Stable per last CXR 08/02/2016 -Denies SOB, cough    Current medicines are reviewed at length with the patient today.  The patient does not have concerns regarding medicines.  The following changes have been made:  no change  Labs/ tests ordered today include: CMET, CBC No orders of the defined types were placed in this encounter.   Disposition:   FU with Dr. [name] in 1 year  Signed, Kathyrn Drown, NP  09/27/2018 3:43 PM    [address], [place], [place]  [address] Phone: [phone]; Fax: [phone]

## 2018-09-30 ENCOUNTER — Telehealth: Payer: Self-pay | Admitting: Cardiology

## 2018-09-30 NOTE — Telephone Encounter (Signed)

## 2018-10-01 ENCOUNTER — Encounter: Payer: Self-pay | Admitting: Cardiology

## 2018-10-01 ENCOUNTER — Ambulatory Visit (INDEPENDENT_AMBULATORY_CARE_PROVIDER_SITE_OTHER): Payer: Medicare Other | Admitting: Cardiology

## 2018-10-01 ENCOUNTER — Other Ambulatory Visit: Payer: Self-pay

## 2018-10-01 VITALS — BP 148/88 | HR 77 | Ht 69.0 in | Wt 138.1 lb

## 2018-10-01 DIAGNOSIS — I714 Abdominal aortic aneurysm, without rupture, unspecified: Secondary | ICD-10-CM

## 2018-10-01 DIAGNOSIS — I2583 Coronary atherosclerosis due to lipid rich plaque: Secondary | ICD-10-CM

## 2018-10-01 DIAGNOSIS — I359 Nonrheumatic aortic valve disorder, unspecified: Secondary | ICD-10-CM

## 2018-10-01 DIAGNOSIS — I2510 Atherosclerotic heart disease of native coronary artery without angina pectoris: Secondary | ICD-10-CM

## 2018-10-01 DIAGNOSIS — E782 Mixed hyperlipidemia: Secondary | ICD-10-CM

## 2018-10-01 NOTE — Patient Instructions (Signed)
Medication Instructions:  Your physician recommends that you continue on your current medications as directed. Please refer to the Current Medication list given to you today.  If you need a refill on your cardiac medications before your next appointment, please call your pharmacy.   Lab work: TODAY:  CMET, LIPID, & CBC  If you have labs (blood work) drawn today and your tests are completely normal, you will receive your results only by: Marland Kitchen MyChart Message (if you have MyChart) OR . A paper copy in the mail If you have any lab test that is abnormal or we need to change your treatment, we will call you to review the results.  Testing/Procedures: None ordered  Follow-Up: At [facility], you and your health needs are our priority.  As part of our continuing mission to provide you with exceptional heart care, we have created designated Provider Care Teams.  These Care Teams include your primary Cardiologist (physician) and Advanced Practice Providers (APPs -  Physician Assistants and Nurse Practitioners) who all work together to provide you with the care you need, when you need it. You will need a follow up appointment in 12 months.  Please call our office 2 months in advance to schedule this appointment.  You may see [name], MD or one of the following Advanced Practice Providers on your designated Care Team:   Truitt Merle, NP Cecilie Kicks, NP . Kathyrn Drown, NP  Any Other Special Instructions Will Be Listed Below (If Applicable).

## 2018-10-01 NOTE — Addendum Note (Signed)
Addended by: Gaetano Net on: 10/01/2018 02:22 PM   Modules accepted: Orders

## 2018-10-02 ENCOUNTER — Telehealth: Payer: Self-pay | Admitting: *Deleted

## 2018-10-02 LAB — CBC
Hematocrit: 47 % (ref 37.5–51.0)
Hemoglobin: 15.3 g/dL (ref 13.0–17.7)
MCH: 28.9 pg (ref 26.6–33.0)
MCHC: 32.6 g/dL (ref 31.5–35.7)
MCV: 89 fL (ref 79–97)
Platelets: 258 10*3/uL (ref 150–450)
RBC: 5.3 x10E6/uL (ref 4.14–5.80)
RDW: 13.9 % (ref 11.6–15.4)
WBC: 8.6 10*3/uL (ref 3.4–10.8)

## 2018-10-02 LAB — COMPREHENSIVE METABOLIC PANEL
ALT: 12 IU/L (ref 0–44)
AST: 17 IU/L (ref 0–40)
Albumin/Globulin Ratio: 2.3 — ABNORMAL HIGH (ref 1.2–2.2)
Albumin: 4.5 g/dL (ref 3.7–4.7)
Alkaline Phosphatase: 66 IU/L (ref 39–117)
BUN/Creatinine Ratio: 22 (ref 10–24)
BUN: 17 mg/dL (ref 8–27)
Bilirubin Total: 0.5 mg/dL (ref 0.0–1.2)
CO2: 25 mmol/L (ref 20–29)
Calcium: 9.6 mg/dL (ref 8.6–10.2)
Chloride: 104 mmol/L (ref 96–106)
Creatinine, Ser: 0.77 mg/dL (ref 0.76–1.27)
GFR calc Af Amer: 102 mL/min/{1.73_m2} (ref >59)
GFR calc non Af Amer: 88 mL/min/{1.73_m2} (ref >59)
Globulin, Total: 2 g/dL (ref 1.5–4.5)
Glucose: 92 mg/dL (ref 65–99)
Potassium: 4.7 mmol/L (ref 3.5–5.2)
Sodium: 144 mmol/L (ref 134–144)
Total Protein: 6.5 g/dL (ref 6.0–8.5)

## 2018-10-02 LAB — LIPID PANEL
Chol/HDL Ratio: 3.1 ratio (ref 0.0–5.0)
Cholesterol, Total: 153 mg/dL (ref 100–199)
HDL: 50 mg/dL (ref >39)
LDL Calculated: 81 mg/dL (ref 0–99)
Triglycerides: 110 mg/dL (ref 0–149)
VLDL Cholesterol Cal: 22 mg/dL (ref 5–40)

## 2018-10-02 NOTE — Telephone Encounter (Signed)
Call placed to pt re: lab results, left a message for him to call back.

## 2018-10-02 NOTE — Telephone Encounter (Signed)
-----   Message from Connor Raymond, NP sent at 10/02/2018  1:15 PM EDT ----- Please let the patient know that his labs all look great. Keep up the good work  Thanks!

## 2018-10-10 NOTE — Progress Notes (Signed)
Pt has been made aware of normal result and verbalized understanding.  jw 10/10/2018

## 2018-10-10 NOTE — Telephone Encounter (Signed)
Call placed to pt re: lab results, and he has been made aware and was very thankful for the good report.

## 2018-10-16 ENCOUNTER — Ambulatory Visit (HOSPITAL_COMMUNITY)
Admission: RE | Admit: 2018-10-16 | Discharge: 2018-10-16 | Disposition: A | Discharge status: Home / Self Care | Payer: Medicare Other | Source: Ambulatory Visit | Attending: Cardiovascular Disease | Admitting: Cardiovascular Disease

## 2018-10-16 ENCOUNTER — Other Ambulatory Visit (HOSPITAL_COMMUNITY): Payer: Self-pay | Admitting: Cardiology

## 2018-10-16 ENCOUNTER — Other Ambulatory Visit: Payer: Self-pay

## 2018-10-16 DIAGNOSIS — I714 Abdominal aortic aneurysm, without rupture, unspecified: Secondary | ICD-10-CM

## 2018-10-22 ENCOUNTER — Ambulatory Visit: Payer: Medicare Other | Admitting: Vascular Surgery

## 2018-10-22 ENCOUNTER — Other Ambulatory Visit (HOSPITAL_COMMUNITY): Payer: Medicare Other

## 2018-11-08 ENCOUNTER — Encounter: Payer: Self-pay | Admitting: Nurse Practitioner

## 2019-01-27 ENCOUNTER — Other Ambulatory Visit: Payer: Self-pay | Admitting: Cardiovascular Disease

## 2019-04-21 ENCOUNTER — Telehealth: Payer: Self-pay | Admitting: Cardiovascular Disease

## 2019-04-21 NOTE — Telephone Encounter (Signed)
Patient is returning calling stating that he is requesting instructions for AAA Duplex on 04/28/19 at 10:00 AM. Please call.

## 2019-04-21 NOTE — Telephone Encounter (Signed)
Returned call to pt and have made him aware that there is no preparation for his AAA study on Monday. Pt very grateful for the call back.

## 2019-04-28 ENCOUNTER — Ambulatory Visit (HOSPITAL_COMMUNITY)
Admission: RE | Admit: 2019-04-28 | Discharge: 2019-04-28 | Disposition: A | Discharge status: Home / Self Care | Payer: Medicare PPO | Source: Ambulatory Visit | Attending: Cardiology | Admitting: Cardiology

## 2019-04-28 ENCOUNTER — Other Ambulatory Visit (HOSPITAL_COMMUNITY): Payer: Self-pay | Admitting: Cardiology

## 2019-04-28 ENCOUNTER — Other Ambulatory Visit: Payer: Self-pay

## 2019-04-28 DIAGNOSIS — I714 Abdominal aortic aneurysm, without rupture, unspecified: Secondary | ICD-10-CM

## 2019-04-30 ENCOUNTER — Telehealth: Payer: Self-pay | Admitting: Cardiovascular Disease

## 2019-04-30 NOTE — Telephone Encounter (Signed)
New Message  Pt is calling to receive his vascular ultrasound results.   Please call back to discuss

## 2019-04-30 NOTE — Telephone Encounter (Signed)
Patient is calling about vascular ultrasound results. I advised patient that the results were not available yet but we would call him once they were. Patient verbalized understanding.

## 2019-05-02 ENCOUNTER — Encounter (INDEPENDENT_AMBULATORY_CARE_PROVIDER_SITE_OTHER): Payer: Self-pay

## 2019-05-02 ENCOUNTER — Encounter: Payer: Self-pay | Admitting: Cardiovascular Disease

## 2019-05-02 ENCOUNTER — Other Ambulatory Visit: Payer: Self-pay

## 2019-05-02 ENCOUNTER — Ambulatory Visit: Payer: Medicare PPO | Admitting: Cardiovascular Disease

## 2019-05-02 VITALS — BP 142/86 | HR 90 | Ht 69.0 in | Wt 133.8 lb

## 2019-05-02 DIAGNOSIS — R079 Chest pain, unspecified: Secondary | ICD-10-CM

## 2019-05-02 DIAGNOSIS — I714 Abdominal aortic aneurysm, without rupture, unspecified: Secondary | ICD-10-CM

## 2019-05-02 DIAGNOSIS — I209 Angina pectoris, unspecified: Secondary | ICD-10-CM | POA: Diagnosis not present

## 2019-05-02 NOTE — Patient Instructions (Signed)
Medication Instructions:  *If you need a refill on your cardiac medications before your next appointment, please call your pharmacy*  Lab Work: Your physician recommends that you return for lab work in: CBC, CMET, TSH  If you have labs (blood work) drawn today and your tests are completely normal, you will receive your results only by: Marland Kitchen MyChart Message (if you have MyChart) OR . A paper copy in the mail If you have any lab test that is abnormal or we need to change your treatment, we will call you to review the results.  Testing/Procedures: Your physician has requested that you have a lexiscan myoview. For further information please visit [email or web address]. Please follow instruction sheet, as given.  Your physician has requested that you have an abdominal aorta duplex- 1 year. During this test, an ultrasound is used to evaluate the aorta. Allow 30 minutes for this exam. Do not eat after midnight the day before and avoid carbonated beverages  Follow-Up: At [facility], you and your health needs are our priority.  As part of our continuing mission to provide you with exceptional heart care, we have created designated Provider Care Teams.  These Care Teams include your primary Cardiologist (physician) and Advanced Practice Providers (APPs -  Physician Assistants and Nurse Practitioners) who all work together to provide you with the care you need, when you need it.  Your next appointment:   1 year(s)  The format for your next appointment:   In Person  Provider:   You may see [name], MD or one of the following Advanced Practice Providers on your designated Care Team:    Truitt Merle, NP  Cecilie Kicks, NP  Kathyrn Drown, NP

## 2019-05-02 NOTE — Progress Notes (Signed)
Patient ID: Connor Cuevas, male   DOB: 1941-01-22, 79 y.o.   MRN: [id number]      78 y.o. with history of COPD, RUL nodule previous smoker. CAD with DES to the mid RCA in October 2015. EF normal On statin for HLD. Needs f/u US abdomen March 2020 for diameter of 3.8 cm   No chest pain Compliant with meds   Has history of AAA 4.5 cm by duplex most recently 04/29/19  05/13/18 had ureteroscopic procedure for nephrolithiasis. No issues holding plavix   Has had sinus infection most of January with malaise. 2 courses of antibiotics and prednisone  Has had some atypical pressure in his chest  No fever. No abdominal pain     ROS: Denies fever, malais, weight loss, blurry vision, decreased visual acuity, cough, sputum, SOB, hemoptysis, pleuritic pain, palpitaitons, heartburn, abdominal pain, melena, lower extremity edema, claudication, or rash.  All other systems reviewed and negative  General: BP (!) 142/86   Pulse 90   Ht 5\' 9"  (1.753 m)   Wt 133 lb 12.8 oz (60.7 kg)   SpO2 99%   BMI 19.76 kg/m  Affect appropriate Healthy:  appears stated age 63: normal Neck supple with no adenopathy JVP normal no bruits no thyromegaly Lungs clear with no wheezing and good diaphragmatic motion Heart:  S1/S2 no murmur, no rub, gallop or click PMI normal Abdomen: benighn, BS positve, no tenderness, no AAA no bruit.  No HSM or HJR Distal pulses intact with no bruits No edema Neuro non-focal Skin warm and dry No muscular weakness    Current Outpatient Medications  Medication Sig Dispense Refill  . atenolol (TENORMIN) 25 MG tablet Take 1 tablet (25 mg total) by mouth daily. 90 tablet 3  . atorvastatin (LIPITOR) 40 MG tablet Take 40 mg by mouth every morning.     . brimonidine (ALPHAGAN P) 0.1 % SOLN Place 1 drop into the right eye 2 (two) times daily.    . clopidogrel (PLAVIX) 75 MG tablet Take 1 tablet by mouth once daily 90 tablet 1  . Coenzyme Q10 (CO Q 10) 100 MG CAPS Take 100 mg by mouth  daily.    . dorzolamide-timolol (COSOPT) 22.3-6.8 MG/ML ophthalmic solution Place 1 drop into both eyes 2 (two) times daily.    Marland Kitchen HYDROcodone-acetaminophen (NORCO) 5-325 MG per tablet Take 0.5 tablets by mouth every 6 (six) hours as needed for moderate pain. For pain    . Multiple Vitamins-Minerals (ICAPS AREDS FORMULA PO) Take 1 tablet by mouth daily.    . nitroGLYCERIN (NITROSTAT) 0.4 MG SL tablet Place 1 tablet (0.4 mg total) under the tongue every 5 (five) minutes as needed for chest pain. 25 tablet 12  . OMEGA-3 KRILL OIL PO Take 200 mg by mouth daily.    Marland Kitchen PROAIR HFA 108 (90 Base) MCG/ACT inhaler INHALE 2 PUFFS BY MOUTH EVERY 4 HOURS AS NEEDED FOR WHEEZING    . tiotropium (SPIRIVA) 18 MCG inhalation capsule Place 18 mcg into inhaler and inhale every morning.     . traZODone (DESYREL) 100 MG tablet Take 100 mg by mouth at bedtime.      No current facility-administered medications for this visit.    Allergies  Macrobid [nitrofurantoin]  Electrocardiogram:  08/09/17  SR rate 72 normal  Assessment and Plan  CAD: DES to RCA 2015 Stable with no angina and good activity level.  Continue medical Rx Atypical Pain f/u lexiscan myouve as he has not had stress test in a long  time   AAA:  4.5 cm by last duplex 04/29/19 f/u in a year   HLD:  On statin labs with primary  COPD:  With RUL lung nodule  Stable by CXR 08/02/16 f/u with primary   Sinus/Mailaise:  F/u ;primary will check lab work including CBC,CMET, and TSH    [name] MD [facility]

## 2019-05-03 LAB — CBC WITH DIFFERENTIAL/PLATELET
Basophils Absolute: 0.1 10*3/uL (ref 0.0–0.2)
Basos: 1 %
EOS (ABSOLUTE): 0.3 10*3/uL (ref 0.0–0.4)
Eos: 3 %
Hematocrit: 45.1 % (ref 37.5–51.0)
Hemoglobin: 15.1 g/dL (ref 13.0–17.7)
Immature Grans (Abs): 0 10*3/uL (ref 0.0–0.1)
Immature Granulocytes: 0 %
Lymphocytes Absolute: 2.6 10*3/uL (ref 0.7–3.1)
Lymphs: 23 %
MCH: 30.2 pg (ref 26.6–33.0)
MCHC: 33.5 g/dL (ref 31.5–35.7)
MCV: 90 fL (ref 79–97)
Monocytes Absolute: 1.3 10*3/uL — ABNORMAL HIGH (ref 0.1–0.9)
Monocytes: 11 %
Neutrophils Absolute: 6.9 10*3/uL (ref 1.4–7.0)
Neutrophils: 62 %
Platelets: 262 10*3/uL (ref 150–450)
RBC: 5 x10E6/uL (ref 4.14–5.80)
RDW: 12.3 % (ref 11.6–15.4)
WBC: 11.2 10*3/uL — ABNORMAL HIGH (ref 3.4–10.8)

## 2019-05-03 LAB — COMPREHENSIVE METABOLIC PANEL
ALT: 14 IU/L (ref 0–44)
AST: 15 IU/L (ref 0–40)
Albumin/Globulin Ratio: 1.8 (ref 1.2–2.2)
Albumin: 4.2 g/dL (ref 3.7–4.7)
Alkaline Phosphatase: 60 IU/L (ref 39–117)
BUN/Creatinine Ratio: 16 (ref 10–24)
BUN: 14 mg/dL (ref 8–27)
Bilirubin Total: 0.4 mg/dL (ref 0.0–1.2)
CO2: 26 mmol/L (ref 20–29)
Calcium: 9.2 mg/dL (ref 8.6–10.2)
Chloride: 100 mmol/L (ref 96–106)
Creatinine, Ser: 0.9 mg/dL (ref 0.76–1.27)
GFR calc Af Amer: 96 mL/min/{1.73_m2} (ref >59)
GFR calc non Af Amer: 83 mL/min/{1.73_m2} (ref >59)
Globulin, Total: 2.3 g/dL (ref 1.5–4.5)
Glucose: 106 mg/dL — ABNORMAL HIGH (ref 65–99)
Potassium: 4.5 mmol/L (ref 3.5–5.2)
Sodium: 136 mmol/L (ref 134–144)
Total Protein: 6.5 g/dL (ref 6.0–8.5)

## 2019-05-03 LAB — TSH: TSH: 1.72 u[IU]/mL (ref 0.450–4.500)

## 2019-05-13 ENCOUNTER — Telehealth (HOSPITAL_COMMUNITY): Payer: Self-pay | Admitting: *Deleted

## 2019-05-13 NOTE — Telephone Encounter (Signed)
Patient given detailed instructions per Myocardial Perfusion Study Information Sheet for the test on 05/19/19 at 10:30. Patient notified to arrive 15 minutes early and that it is imperative to arrive on time for appointment to keep from having the test rescheduled.  If you need to cancel or reschedule your appointment, please call the office within 24 hours of your appointment. . Patient verbalized understanding.Connor Cuevas

## 2019-05-19 ENCOUNTER — Encounter (HOSPITAL_COMMUNITY): Payer: Medicare PPO

## 2019-05-27 ENCOUNTER — Encounter (HOSPITAL_COMMUNITY): Payer: Medicare PPO

## 2019-05-28 ENCOUNTER — Ambulatory Visit: Payer: Medicare PPO | Admitting: Cardiovascular Disease

## 2019-06-04 ENCOUNTER — Telehealth (HOSPITAL_COMMUNITY): Payer: Self-pay

## 2019-06-04 NOTE — Telephone Encounter (Signed)
Detailed instructions left on the patient's answering machine. Asked to call back with any questions. S.Tome Wilson EMTP 

## 2019-06-05 ENCOUNTER — Ambulatory Visit (HOSPITAL_COMMUNITY): Payer: Medicare PPO | Attending: Internal Medicine

## 2019-06-05 DIAGNOSIS — I714 Abdominal aortic aneurysm, without rupture, unspecified: Secondary | ICD-10-CM

## 2019-06-05 DIAGNOSIS — R079 Chest pain, unspecified: Secondary | ICD-10-CM | POA: Diagnosis not present

## 2019-06-05 DIAGNOSIS — I209 Angina pectoris, unspecified: Secondary | ICD-10-CM | POA: Insufficient documentation

## 2019-06-05 LAB — MYOCARDIAL PERFUSION IMAGING
LV dias vol: 92 mL (ref 62–150)
LV sys vol: 47 mL
Peak HR: 91 {beats}/min
Rest HR: 76 {beats}/min
SDS: 3
SRS: 0
SSS: 3
TID: 1.08

## 2019-06-05 MED ORDER — TECHNETIUM TC 99M TETROFOSMIN IV KIT
32.2000 | PACK | Freq: Once | INTRAVENOUS | Status: AC | PRN
  Administered 2019-06-05: 32.2 via INTRAVENOUS
  Filled 2019-06-05: qty 33

## 2019-06-05 MED ORDER — REGADENOSON 0.4 MG/5ML IV SOLN
0.4000 mg | Freq: Once | INTRAVENOUS | Status: AC
  Administered 2019-06-05: 10:00:00 0.4 mg via INTRAVENOUS

## 2019-06-05 MED ORDER — TECHNETIUM TC 99M TETROFOSMIN IV KIT
10.7000 | PACK | Freq: Once | INTRAVENOUS | Status: AC | PRN
  Administered 2019-06-05: 10.7 via INTRAVENOUS
  Filled 2019-06-05: qty 11

## 2019-06-10 ENCOUNTER — Telehealth: Payer: Self-pay | Admitting: Cardiovascular Disease

## 2019-06-10 NOTE — Telephone Encounter (Signed)
Patient returning Pam's call in regards to stress test results.

## 2019-06-10 NOTE — Telephone Encounter (Signed)
Left message for patient to call back. Will also put results in Mychart.

## 2019-06-10 NOTE — Telephone Encounter (Signed)
Patient called back. Patient aware of results.

## 2019-06-12 ENCOUNTER — Other Ambulatory Visit: Payer: Self-pay

## 2019-06-12 MED ORDER — NITROGLYCERIN 0.4 MG SL SUBL: 0.4000 mg | SUBLINGUAL_TABLET | SUBLINGUAL | 12 refills | Status: DC | PRN

## 2019-07-18 DIAGNOSIS — J0100 Acute maxillary sinusitis, unspecified: Secondary | ICD-10-CM | POA: Diagnosis not present

## 2019-07-18 DIAGNOSIS — J449 Chronic obstructive pulmonary disease, unspecified: Secondary | ICD-10-CM | POA: Diagnosis not present

## 2019-08-12 ENCOUNTER — Ambulatory Visit: Payer: Self-pay

## 2019-08-12 ENCOUNTER — Other Ambulatory Visit: Payer: Self-pay

## 2019-08-12 ENCOUNTER — Ambulatory Visit: Payer: Medicare PPO | Admitting: Surgical

## 2019-08-12 DIAGNOSIS — M545 Low back pain, unspecified: Secondary | ICD-10-CM

## 2019-08-12 MED ORDER — METHOCARBAMOL 500 MG PO TABS: 500.0000 mg | ORAL_TABLET | Freq: Three times a day (TID) | ORAL | 0 refills | Status: DC | PRN

## 2019-08-12 MED ORDER — PREDNISONE 10 MG (21) PO TBPK: ORAL_TABLET | ORAL | 0 refills | Status: DC

## 2019-08-23 ENCOUNTER — Encounter: Payer: Self-pay | Admitting: Surgical

## 2019-08-23 NOTE — Progress Notes (Signed)
Office Visit Note   Patient: Connor Cuevas           Date of Birth: 1940-06-10           MRN: [id number] Visit Date: 08/12/2019 Requested by: [name], MD [address],  [place] [address] PCP: [name], MD  Subjective: Chief Complaint  Patient presents with  . Lower Back - Pain    HPI: KUNJ SAFRIT is a 79 y.o. male who presents to the office complaining of low back pain.  Patient reports increasing low back pain over the last 6 days.Marland Kitchen  He denies any injury or acute event leading to the onset of pain.  Pain does not wake him up at night.  He has numbness and tingling down both legs that comes and goes.  He also has posterior radicular pain that travels down both legs to his bilateral heels.  His right leg bothers him more than his left.  He has occasional weakness in the right leg.  He has no history of back surgery, ESI's, MRI of the lumbar spine.  He has no red flag symptoms such as saddle anesthesia, bowel/bladder incontinence.  He does note occasional groin pain.              ROS:  All systems reviewed are negative as they relate to the chief complaint within the history of present illness.  Patient denies fevers or chills.  Assessment & Plan: Visit Diagnoses:  1. Low back pain, unspecified back pain laterality, unspecified chronicity, unspecified whether sciatica present     Plan: Patient is a 79 year old male who presents complaining of low back pain and bilateral leg radicular pain.  He has no history of back surgery.  No history of injury leading to onset of pain.  No red flag symptoms.  Radiographs taken today reveal moderate degenerative changes throughout the lumbar spine but no spondylolisthesis or fracture.  He does have a positive straight leg raise bilaterally.  No significant weakness on exam.  Prescribed Robaxin and a steroid Dosepak for patient's symptoms.  Also referred patient outpatient physical therapy for 1 session so that he may  start a home exercise program.  Follow-up in 4 weeks for clinical recheck.  If no improvement will consider MRI of the lumbar spine at that time.  Follow-Up Instructions: No follow-ups on file.   Orders:  Orders Placed This Encounter  Procedures  . XR Lumbar Spine 2-3 Views  . Ambulatory referral to Physical Therapy   Meds ordered this encounter  Medications  . predniSONE (STERAPRED UNI-PAK 21 TAB) 10 MG (21) TBPK tablet    Sig: Take as directed on packaging    Dispense:  21 tablet    Refill:  0  . methocarbamol (ROBAXIN) 500 MG tablet    Sig: Take 1 tablet (500 mg total) by mouth every 8 (eight) hours as needed.    Dispense:  30 tablet    Refill:  0      Procedures: No procedures performed   Clinical Data: No additional findings.  Objective: Vital Signs: There were no vitals taken for this visit.  Physical Exam:  Constitutional: Patient appears well-developed HEENT:  Head: Normocephalic Eyes:EOM are normal Neck: Normal range of motion Cardiovascular: Normal rate Pulmonary/chest: Effort normal Neurologic: Patient is alert Skin: Skin is warm Psychiatric: Patient has normal mood and affect  Ortho Exam:  Mild tenderness to palpation throughout the axial lumbar spine.  5/5 motor strength of the  bilateral hip flexor, quadriceps, hamstring, dorsiflexion, plantarflexion.  Positive straight leg raise bilaterally.  No tenderness to palpation over the trochanter tear versus bilaterally.  No pain with internal rotation/external rotation of bilateral hip joints.  Sensation intact through all dermatomes of the bilateral lower extremities.  No evidence of hypo or hyperreflexia.  Specialty Comments:  No specialty comments available.  Imaging: No results found.   PMFS History: Patient Active Problem List   Diagnosis Date Noted  . Unstable angina ([place]) 01/10/2014  . Hyperlipidemia   . CAD (coronary artery disease)   . COPD (chronic obstructive pulmonary disease) ([place])   .  AAA (abdominal aortic aneurysm) ([place])   . 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 Tobacco abuse   . 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 Pulmonary nodule 09/28/2008  . ANXIETY 09/25/2008  . Aortic valve disorder 09/25/2008   Past Medical History:  Diagnosis Date  . AAA (abdominal aortic aneurysm) ([place]) 04/16/2018   4.3 cm, noted on [place]   . Acute kidney failure ([place]) 2005 approx   after ureteroscopy, hospitalized for about 10 days afterwards  . Anxiety   . Arthritis    Hands, wrist, elbow  . Bulging lumbar disc   . CAD (coronary artery disease)    a. 01/09/14: [place] s/p DES to RCA   . COPD (chronic obstructive pulmonary disease) ([place])   . Glaucoma of both eyes   . Hepatic cyst 2013   Several tiny hepatic cysts , noted on CT  . History of bilateral inguinal hernias   . History of kidney stones   . Hyperlipidemia   . Interstitial lung disease ([place])   . Leg pain    ABIs (10/15):  R 1.3, L 1.2, normal  . Pneumonia   . Pulmonary nodule, right 08/02/2016   a. Right upper lobe  . Right ureteral stone   . Skin cancer    Nose  . 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 Aortosclerosis, bilateral - mild with calcification. Per Dr. Moreen Fowler at [facility]   . Tobacco abuse   . Wears dentures    upper   . Wears glasses     Family History  Problem Relation Age of Onset  . Hypertension Mother   . Heart attack Other        uncle  . Stroke Other   . Stroke Maternal Grandmother   . Stroke Maternal Grandfather   . Stroke Paternal Grandmother   . Stroke Paternal Grandfather     Past Surgical History:  Procedure Laterality Date  . CATARACT EXTRACTION W/ INTRAOCULAR LENS  IMPLANT, BILATERAL  1997 (APPROX)  . CYSTOSCOPY/RETROGRADE/URETEROSCOPY/STONE EXTRACTION WITH BASKET  10/17/2011   Procedure: CYSTOSCOPY/RETROGRADE/URETEROSCOPY/STONE EXTRACTION WITH BASKET;  Surgeon: Hanley Ben, MD;  Location: [facility];  Service: Urology;  Laterality: Right;  .  CYSTOSCOPY/URETEROSCOPY/HOLMIUM LASER/STENT PLACEMENT Right 05/13/2018   Procedure: CYSTOSCOPY/RETROGRADE/URETEROSCOPY/HOLMIUM LASER;  Surgeon: Kathie Rhodes, MD;  Location: [facility];  Service: Urology;  Laterality: Right;  . LAPAROSCOPIC LEFT INGUINAL (OBTURATOR) HERNIA AND UMBILICAL HERNIA REPAIR  01-13-2011  . LEFT HEART CATHETERIZATION WITH CORONARY ANGIOGRAM N/A 01/09/2014   Procedure: LEFT HEART CATHETERIZATION WITH CORONARY ANGIOGRAM;  Surgeon: Josue Hector, MD;  Location: [facility] CATH LAB;  Service: Cardiovascular;  Laterality: N/A;  . LEFT URETEROSCOPIC STONE EXTRACTION  11-30-2000   W/ PREVIOUS ESWL AND URETERAL STENT PLACEMENT  . REPAIR RIGHT INGUINAL HERNIA W/ MESH  12-31-2000  . URETEROLITHOTOMY  2009   Social History  Occupational History  . Not on file  Tobacco Use  . Smoking status: Former Smoker    Packs/day: 1.00    Years: 53.00    Pack years: 53.00    Types: Cigarettes    Quit date: 03/11/2018    Years since quitting: 1.4  . Smokeless tobacco: Never Used  Substance and Sexual Activity  . Alcohol use: No  . Drug use: No  . Sexual activity: Yes

## 2019-08-25 ENCOUNTER — Other Ambulatory Visit: Payer: Self-pay | Admitting: Surgical

## 2019-08-26 ENCOUNTER — Ambulatory Visit: Payer: Medicare PPO | Admitting: Physical Therapy

## 2019-08-27 ENCOUNTER — Other Ambulatory Visit: Payer: Self-pay

## 2019-08-27 MED ORDER — CLOPIDOGREL BISULFATE 75 MG PO TABS: 75.0000 mg | ORAL_TABLET | Freq: Every day | ORAL | 1 refills | Status: DC

## 2019-08-29 ENCOUNTER — Other Ambulatory Visit: Payer: Self-pay | Admitting: Surgical

## 2019-08-29 ENCOUNTER — Other Ambulatory Visit: Payer: Self-pay | Admitting: Cardiovascular Disease

## 2019-09-10 ENCOUNTER — Ambulatory Visit: Payer: Medicare PPO | Admitting: Orthopedic Surgery

## 2019-09-10 DIAGNOSIS — M5441 Lumbago with sciatica, right side: Secondary | ICD-10-CM

## 2019-09-13 ENCOUNTER — Encounter: Payer: Self-pay | Admitting: Orthopedic Surgery

## 2019-09-13 NOTE — Progress Notes (Signed)
Office Visit Note   Patient: Connor Cuevas           Date of Birth: 1941/02/07           MRN: [id number] Visit Date: 09/10/2019 Requested by: [name], MD [address],  [place] [address] PCP: [name], MD  Subjective: Chief Complaint  Patient presents with  . Lower Back - Follow-up    HPI: Connor Cuevas is a 79 y.o. male who presents to the office complaining of low back pain with right-sided radicular leg pain.  He has been doing a home exercise program as prescribed by physical therapist with no relief from symptoms.  He has good days and bad days but overall he has no improvement compared with the previous office visit.  Pain does not wake him up at night.  He does have subjective weakness but no red flag symptoms.  He does have numbness and tingling throughout the right leg on occasion, every other day.  He takes Norco 5 mg about once a day as needed which he receives from his primary care physician..                ROS:  All systems reviewed are negative as they relate to the chief complaint within the history of present illness.  Patient denies fevers or chills.  Assessment & Plan: Visit Diagnoses:  1. Right-sided low back pain with right-sided sciatica, unspecified chronicity     Plan: Patient is a 79 year old male who presents complaining of low back pain with right-sided radicular pain.  Pain travels down the posterior aspect of his right leg into the right ankle.  He has been to physical therapy with transition to home exercise program as prescribed by the physical therapist for 6 weeks..  This has not provided any relief.  He was also prescribed a steroid Dosepak at his last office visit which did not provide lasting relief.  Plan to order MRI of the lumbar spine for further evaluation to evaluate for herniated disc.  Lumbar ESI is likely to follow patient agreed with this plan.  Follow-up after MRI to review results.  Follow-Up Instructions:  No follow-ups on file.   Orders:  Orders Placed This Encounter  Procedures  . MR Lumbar Spine w/o contrast   No orders of the defined types were placed in this encounter.     Procedures: No procedures performed   Clinical Data: No additional findings.  Objective: Vital Signs: There were no vitals taken for this visit.  Physical Exam:  Constitutional: Patient appears well-developed HEENT:  Head: Normocephalic Eyes:EOM are normal Neck: Normal range of motion Cardiovascular: Normal rate Pulmonary/chest: Effort normal Neurologic: Patient is alert Skin: Skin is warm Psychiatric: Patient has normal mood and affect  Ortho Exam:  Tender palpation throughout the axial lumbar spine, more so in the inferior aspect of the lumbar spine.  No significant paraspinal musculature tenderness.  Positive straight leg raise bilaterally.  Sensation intact through all dermatomes of the bilateral lower extremities.  5/5 motor strength of the bilateral hip flexors, quadriceps, hamstring, dorsiflexion, plantarflexion.  Specialty Comments:  No specialty comments available.  Imaging: No results found.   PMFS History: Patient Active Problem List   Diagnosis Date Noted  . Unstable angina ([place]) 01/10/2014  . Hyperlipidemia   . CAD (coronary artery disease)   . COPD (chronic obstructive pulmonary disease) ([place])   . AAA (abdominal aortic aneurysm) ([place])   . Stenosis of iliac  artery ([place])   . Tobacco abuse   . 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 Pulmonary nodule 09/28/2008  . ANXIETY 09/25/2008  . Aortic valve disorder 09/25/2008   Past Medical History:  Diagnosis Date  . AAA (abdominal aortic aneurysm) ([place]) 04/16/2018   4.3 cm, noted on [place]   . Acute kidney failure ([place]) 2005 approx   after ureteroscopy, hospitalized for about 10 days afterwards  . Anxiety   . Arthritis    Hands, wrist, elbow  . Bulging lumbar disc   . CAD (coronary artery disease)    a. 01/09/14: [place] s/p DES to RCA     . COPD (chronic obstructive pulmonary disease) ([place])   . Glaucoma of both eyes   . Hepatic cyst 2013   Several tiny hepatic cysts , noted on CT  . History of bilateral inguinal hernias   . History of kidney stones   . Hyperlipidemia   . Interstitial lung disease ([place])   . Leg pain    ABIs (10/15):  R 1.3, L 1.2, normal  . Pneumonia   . Pulmonary nodule, right 08/02/2016   a. Right upper lobe  . Right ureteral stone   . Skin cancer    Nose  . 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 Aortosclerosis, bilateral - mild with calcification. Per Dr. Moreen Fowler at [facility]   . Tobacco abuse   . Wears dentures    upper   . Wears glasses     Family History  Problem Relation Age of Onset  . Hypertension Mother   . Heart attack Other        uncle  . Stroke Other   . Stroke Maternal Grandmother   . Stroke Maternal Grandfather   . Stroke Paternal Grandmother   . Stroke Paternal Grandfather     Past Surgical History:  Procedure Laterality Date  . CATARACT EXTRACTION W/ INTRAOCULAR LENS  IMPLANT, BILATERAL  1997 (APPROX)  . CYSTOSCOPY/RETROGRADE/URETEROSCOPY/STONE EXTRACTION WITH BASKET  10/17/2011   Procedure: CYSTOSCOPY/RETROGRADE/URETEROSCOPY/STONE EXTRACTION WITH BASKET;  Surgeon: Hanley Ben, MD;  Location: [facility];  Service: Urology;  Laterality: Right;  . CYSTOSCOPY/URETEROSCOPY/HOLMIUM LASER/STENT PLACEMENT Right 05/13/2018   Procedure: CYSTOSCOPY/RETROGRADE/URETEROSCOPY/HOLMIUM LASER;  Surgeon: Kathie Rhodes, MD;  Location: [facility];  Service: Urology;  Laterality: Right;  . LAPAROSCOPIC LEFT INGUINAL (OBTURATOR) HERNIA AND UMBILICAL HERNIA REPAIR  01-13-2011  . LEFT HEART CATHETERIZATION WITH CORONARY ANGIOGRAM N/A 01/09/2014   Procedure: LEFT HEART CATHETERIZATION WITH CORONARY ANGIOGRAM;  Surgeon: Josue Hector, MD;  Location: [facility] CATH LAB;  Service: Cardiovascular;  Laterality: N/A;  . LEFT URETEROSCOPIC STONE EXTRACTION  11-30-2000    W/ PREVIOUS ESWL AND URETERAL STENT PLACEMENT  . REPAIR RIGHT INGUINAL HERNIA W/ MESH  12-31-2000  . URETEROLITHOTOMY  2009   Social History   Occupational History  . Not on file  Tobacco Use  . Smoking status: Former Smoker    Packs/day: 1.00    Years: 53.00    Pack years: 53.00    Types: Cigarettes    Quit date: 03/11/2018    Years since quitting: 1.5  . Smokeless tobacco: Never Used  Vaping Use  . Vaping Use: Never used  Substance and Sexual Activity  . Alcohol use: No  . Drug use: No  . Sexual activity: Yes

## 2019-09-14 ENCOUNTER — Encounter: Payer: Self-pay | Admitting: Orthopedic Surgery

## 2019-09-15 NOTE — Progress Notes (Signed)
faxed

## 2019-09-16 DIAGNOSIS — H401121 Primary open-angle glaucoma, left eye, mild stage: Secondary | ICD-10-CM | POA: Diagnosis not present

## 2019-09-25 DIAGNOSIS — H4031X3 Glaucoma secondary to eye trauma, right eye, severe stage: Secondary | ICD-10-CM | POA: Diagnosis not present

## 2019-10-24 DIAGNOSIS — Z87442 Personal history of urinary calculi: Secondary | ICD-10-CM | POA: Diagnosis not present

## 2019-10-24 DIAGNOSIS — E782 Mixed hyperlipidemia: Secondary | ICD-10-CM | POA: Diagnosis not present

## 2019-10-24 DIAGNOSIS — M1990 Unspecified osteoarthritis, unspecified site: Secondary | ICD-10-CM | POA: Diagnosis not present

## 2019-10-24 DIAGNOSIS — Z955 Presence of coronary angioplasty implant and graft: Secondary | ICD-10-CM | POA: Diagnosis not present

## 2019-10-24 DIAGNOSIS — F419 Anxiety disorder, unspecified: Secondary | ICD-10-CM | POA: Diagnosis not present

## 2019-10-24 DIAGNOSIS — I2510 Atherosclerotic heart disease of native coronary artery without angina pectoris: Secondary | ICD-10-CM | POA: Diagnosis not present

## 2019-10-24 DIAGNOSIS — J449 Chronic obstructive pulmonary disease, unspecified: Secondary | ICD-10-CM | POA: Diagnosis not present

## 2019-10-27 ENCOUNTER — Ambulatory Visit
Admission: RE | Admit: 2019-10-27 | Discharge: 2019-10-27 | Disposition: A | Discharge status: Home / Self Care | Payer: Medicare PPO | Source: Ambulatory Visit | Attending: Orthopedic Surgery | Admitting: Orthopedic Surgery

## 2019-10-27 ENCOUNTER — Other Ambulatory Visit: Payer: Self-pay

## 2019-10-27 ENCOUNTER — Telehealth: Payer: Self-pay

## 2019-10-27 DIAGNOSIS — M5441 Lumbago with sciatica, right side: Secondary | ICD-10-CM

## 2019-10-27 DIAGNOSIS — M48061 Spinal stenosis, lumbar region without neurogenic claudication: Secondary | ICD-10-CM | POA: Diagnosis not present

## 2019-10-27 NOTE — Telephone Encounter (Signed)
FYI-  Diane with [facility] called to inform Dr. Marlou Sa of report of MRI  Lumbar Spine.

## 2019-10-27 NOTE — Telephone Encounter (Signed)
Please review scan and advise?

## 2019-10-27 NOTE — Progress Notes (Signed)
Pls refer him to vascular thx

## 2019-10-28 ENCOUNTER — Other Ambulatory Visit: Payer: Self-pay

## 2019-10-28 DIAGNOSIS — M545 Low back pain, unspecified: Secondary | ICD-10-CM

## 2019-10-28 DIAGNOSIS — I714 Abdominal aortic aneurysm, without rupture, unspecified: Secondary | ICD-10-CM

## 2019-10-29 NOTE — Telephone Encounter (Signed)
He needs to see vascular surgery.  Has a slightly increasing aneurysm.  Needs follow-up with those guys so they can potentially fix it before that actually open them up.  Regarding the back I would refer him to [name] for ESI's.  Thanks if possible he should see one of the vascular guys sooner rather than later.

## 2019-10-30 ENCOUNTER — Ambulatory Visit: Payer: Medicare PPO | Admitting: Orthopedic Surgery

## 2019-10-30 DIAGNOSIS — I714 Abdominal aortic aneurysm, without rupture, unspecified: Secondary | ICD-10-CM

## 2019-10-30 DIAGNOSIS — M545 Low back pain, unspecified: Secondary | ICD-10-CM

## 2019-10-30 MED ORDER — GABAPENTIN 100 MG PO CAPS
100.0000 mg | ORAL_CAPSULE | Freq: Three times a day (TID) | ORAL | 0 refills | Status: DC
Start: 2019-10-30 — End: 2021-01-12

## 2019-10-30 NOTE — Telephone Encounter (Signed)
Seen in office today  

## 2019-10-30 NOTE — Progress Notes (Signed)
Office Visit Note   Patient: Connor Cuevas           Date of Birth: 1940-12-01           MRN: [id number] Visit Date: 10/30/2019 Requested by: [name], MD [name],  [place] [address] PCP: [name], MD  Subjective: Chief Complaint  Patient presents with  . Follow-up    HPI: Connor Cuevas is a 79 y.o. male who presents to the office complaining of back pain.  He continues to complain of lumbar back pain with right-sided radicular pain.  He has pain that travels from his back into his right groin and the right posterior thigh.  He had MRI scan of the lumbar spine after failure of physical therapy.  MRI revealed infrarenal abdominal aortic aneurysm measuring at least 4.9 cm which is increased from 2018 abdominal ultrasound when it measured 3.8 cm.  MRI also revealed multilevel disc bulge most notable at L3-L4 with bilateral subarticular recess narrowing and moderate right foraminal stenosis.  Patient does have a history of abdominal aortic aneurysm for which he is followed by Dr. [name].  He had ultrasound of his abdomen that had aneurysm that measured 3.8 cm.  Most recently it measured 4.5 cm by ultrasound on 04/29/2019.              ROS: All systems reviewed are negative as they relate to the chief complaint within the history of present illness.  Patient denies fevers or chills.  Assessment & Plan: Visit Diagnoses:  1. Low back pain, unspecified back pain laterality, unspecified chronicity, unspecified whether sciatica present     Plan: Patient is a 79 year old male who presents complaint of continued low back pain with right-sided radicular pain.  He denies any red flag symptoms today and denies any weakness of the right leg aside from occasional transient weakness that comes on when he is on his feet for long periods of time and only last the rest of the day.  He had MRI scan that revealed findings as detailed above in HPI.  With failure of  physical therapy, plan to try epidural steroid injections for relief.  Referred patient to [name] for ESI's.  Also prescribed gabapentin to help with symptomatic relief until he sees [name].  Regarding his AAA, recommended that he follow-up with Dr. [name] within the next week for reevaluation and review of the MRI.  Patient agreed with this plan.  Follow-up as needed.  Follow-Up Instructions: No follow-ups on file.   Orders:  Orders Placed This Encounter  Procedures  . Ambulatory referral to Physical Medicine Rehab   Meds ordered this encounter  Medications  . gabapentin (NEURONTIN) 100 MG capsule    Sig: Take 1 capsule (100 mg total) by mouth 3 (three) times daily.    Dispense:  90 capsule    Refill:  0      Procedures: No procedures performed   Clinical Data: No additional findings.  Objective: Vital Signs: There were no vitals taken for this visit.  Physical Exam:  Constitutional: Patient appears well-developed HEENT:  Head: Normocephalic Eyes:EOM are normal Neck: Normal range of motion Cardiovascular: Normal rate Pulmonary/chest: Effort normal Neurologic: Patient is alert Skin: Skin is warm Psychiatric: Patient has normal mood and affect  Ortho Exam: Orthopedic exam demonstrates tenderness throughout the axial lumbar spine and worst over the right-sided paraspinal musculature.  Positive straight leg raise.  5/5 motor strength of the bilateral  hip flexors, quadriceps, hamstring, dorsiflexion, plantarflexion.  Sensation intact through all dermatomes of the bilateral lower extremities.  Specialty Comments:  No specialty comments available.  Imaging: No results found.   PMFS History: Patient Active Problem List   Diagnosis Date Noted  . Unstable angina ([place]) 01/10/2014  . Hyperlipidemia   . CAD (coronary artery disease)   . COPD (chronic obstructive pulmonary disease) ([place])   . AAA (abdominal aortic aneurysm) ([place])   . Stenosis of iliac artery  ([facility])   . Tobacco abuse   . 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 Pulmonary nodule 09/28/2008  . ANXIETY 09/25/2008  . Aortic valve disorder 09/25/2008   Past Medical History:  Diagnosis Date  . AAA (abdominal aortic aneurysm) ([place]) 04/16/2018   4.3 cm, noted on [place]   . Acute kidney failure ([place]) 2005 approx   after ureteroscopy, hospitalized for about 10 days afterwards  . Anxiety   . Arthritis    Hands, wrist, elbow  . Bulging lumbar disc   . CAD (coronary artery disease)    a. 01/09/14: [place] s/p DES to RCA   . COPD (chronic obstructive pulmonary disease) ([place])   . Glaucoma of both eyes   . Hepatic cyst 2013   Several tiny hepatic cysts , noted on CT  . History of bilateral inguinal hernias   . History of kidney stones   . Hyperlipidemia   . Interstitial lung disease ([place])   . Leg pain    ABIs (10/15):  R 1.3, L 1.2, normal  . Pneumonia   . Pulmonary nodule, right 08/02/2016   a. Right upper lobe  . Right ureteral stone   . Skin cancer    Nose  . 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 Aortosclerosis, bilateral - mild with calcification. Per Dr. Moreen Fowler at [facility]   . Tobacco abuse   . Wears dentures    upper   . Wears glasses     Family History  Problem Relation Age of Onset  . Hypertension Mother   . Heart attack Other        uncle  . Stroke Other   . Stroke Maternal Grandmother   . Stroke Maternal Grandfather   . Stroke Paternal Grandmother   . Stroke Paternal Grandfather     Past Surgical History:  Procedure Laterality Date  . CATARACT EXTRACTION W/ INTRAOCULAR LENS  IMPLANT, BILATERAL  1997 (APPROX)  . CYSTOSCOPY/RETROGRADE/URETEROSCOPY/STONE EXTRACTION WITH BASKET  10/17/2011   Procedure: CYSTOSCOPY/RETROGRADE/URETEROSCOPY/STONE EXTRACTION WITH BASKET;  Surgeon: Hanley Ben, MD;  Location: [facility];  Service: Urology;  Laterality: Right;  . CYSTOSCOPY/URETEROSCOPY/HOLMIUM LASER/STENT PLACEMENT Right 05/13/2018   Procedure:  CYSTOSCOPY/RETROGRADE/URETEROSCOPY/HOLMIUM LASER;  Surgeon: Kathie Rhodes, MD;  Location: [facility];  Service: Urology;  Laterality: Right;  . LAPAROSCOPIC LEFT INGUINAL (OBTURATOR) HERNIA AND UMBILICAL HERNIA REPAIR  01-13-2011  . LEFT HEART CATHETERIZATION WITH CORONARY ANGIOGRAM N/A 01/09/2014   Procedure: LEFT HEART CATHETERIZATION WITH CORONARY ANGIOGRAM;  Surgeon: Josue Hector, MD;  Location: [facility] CATH LAB;  Service: Cardiovascular;  Laterality: N/A;  . LEFT URETEROSCOPIC STONE EXTRACTION  11-30-2000   W/ PREVIOUS ESWL AND URETERAL STENT PLACEMENT  . REPAIR RIGHT INGUINAL HERNIA W/ MESH  12-31-2000  . URETEROLITHOTOMY  2009   Social History   Occupational History  . Not on file  Tobacco Use  . Smoking status: Former Smoker    Packs/day: 1.00    Years: 53.00    Pack years: 53.00  Types: Cigarettes    Quit date: 03/11/2018    Years since quitting: 1.6  . Smokeless tobacco: Never Used  Vaping Use  . Vaping Use: Never used  Substance and Sexual Activity  . Alcohol use: No  . Drug use: No  . Sexual activity: Yes

## 2019-10-31 ENCOUNTER — Encounter: Payer: Self-pay | Admitting: Orthopedic Surgery

## 2019-10-31 DIAGNOSIS — J449 Chronic obstructive pulmonary disease, unspecified: Secondary | ICD-10-CM | POA: Diagnosis not present

## 2019-11-05 ENCOUNTER — Encounter: Payer: Self-pay | Admitting: Ophthalmology

## 2019-11-05 ENCOUNTER — Other Ambulatory Visit: Payer: Self-pay

## 2019-11-06 ENCOUNTER — Encounter: Payer: Self-pay | Admitting: Anesthesiology

## 2019-11-10 ENCOUNTER — Other Ambulatory Visit: Admission: RE | Admit: 2019-11-10 | Payer: Medicare PPO | Source: Ambulatory Visit

## 2019-11-17 ENCOUNTER — Ambulatory Visit: Payer: Self-pay

## 2019-11-17 ENCOUNTER — Ambulatory Visit: Payer: Medicare PPO | Admitting: Physical Medicine and Rehabilitation

## 2019-11-17 ENCOUNTER — Other Ambulatory Visit: Payer: Self-pay

## 2019-11-17 ENCOUNTER — Encounter: Payer: Self-pay | Admitting: Physical Medicine and Rehabilitation

## 2019-11-17 VITALS — BP 143/83 | HR 81

## 2019-11-17 DIAGNOSIS — M5416 Radiculopathy, lumbar region: Secondary | ICD-10-CM | POA: Diagnosis not present

## 2019-11-17 DIAGNOSIS — M5116 Intervertebral disc disorders with radiculopathy, lumbar region: Secondary | ICD-10-CM | POA: Diagnosis not present

## 2019-11-17 MED ORDER — DEXAMETHASONE SODIUM PHOSPHATE 10 MG/ML IJ SOLN
15.0000 mg | Freq: Once | INTRAMUSCULAR | Status: AC
  Administered 2019-11-17: 15 mg

## 2019-11-17 NOTE — Progress Notes (Signed)
Pt states right lower back pain. Pt states it always there, it never goes way. Pt state pain pills helps with the pain.  Numeric Pain Rating Scale and Functional Assessment Average Pain 6   In the last MONTH (on 0-10 scale) has pain interfered with the following?  1. General activity like being  able to carry out your everyday physical activities such as walking, climbing stairs, carrying groceries, or moving a chair?  Rating(8)   +Driver, +BT, -Dye Allergies.

## 2019-11-18 NOTE — Progress Notes (Signed)
Connor Cuevas - 79 y.o. male MRN [id number]  Date of birth: 18-Aug-1940  Office Visit Note: Visit Date: 11/17/2019 PCP: [name], MD Referred by: [name], MD  Subjective: Chief Complaint  Patient presents with  . Lower Back - Pain   HPI: Connor Cuevas is a 79 y.o. male who comes in today at the request of Dr. Anderson Malta for planned Right L3-L4 Lumbar epidural steroid injection with fluoroscopic guidance.  The patient has failed conservative care including home exercise, medications, time and activity modification.  This injection will be diagnostic and hopefully therapeutic.  Please see requesting physician notes for further details and justification.  MRI reviewed with images and spine model.  MRI reviewed in the note below.  Based on symptoms and MRI review we will complete L3 transforaminal injection.  Patient is on anticoagulation but this is okay for transforaminal approach epidurals.  Depending on relief would look at L4 injection.   ROS Otherwise per HPI.  Assessment & Plan: Visit Diagnoses:  1. Lumbar radiculopathy   2. Radiculopathy due to lumbar intervertebral disc disorder     Plan: No additional findings.   Meds & Orders:  Meds ordered this encounter  Medications  . dexamethasone (DECADRON) injection 15 mg    Orders Placed This Encounter  Procedures  . XR C-ARM NO REPORT  . Epidural Steroid injection    Follow-up: Return if symptoms worsen or fail to improve.   Procedures: No procedures performed  Lumbosacral Transforaminal Epidural Steroid Injection - Sub-Pedicular Approach with Fluoroscopic Guidance  Patient: Connor Cuevas      Date of Birth: 06-Mar-1941 MRN: [id number] PCP: [name], MD      Visit Date: 11/17/2019   Universal Protocol:    Date/Time: 11/17/2019  Consent Given By: the patient  Position: PRONE  Additional Comments: Vital signs were monitored before and after the procedure. Patient was prepped and draped in the  usual sterile fashion. The correct patient, procedure, and site was verified.   Injection Procedure Details:  Procedure Site One Meds Administered:  Meds ordered this encounter  Medications  . dexamethasone (DECADRON) injection 15 mg    Laterality: Right  Location/Site:  L3-L4  Needle size: 30 G  Needle type: Spinal  Needle Placement: Transforaminal  Findings:    -Comments: Excellent flow of contrast along the nerve, nerve root and into the epidural space.  Procedure Details: After squaring off the end-plates to get a true AP view, the C-arm was positioned so that an oblique view of the foramen as noted above was visualized. The target area is just inferior to the "nose of the scotty dog" or sub pedicular. The soft tissues overlying this structure were infiltrated with 2-3 ml. of 1% Lidocaine without Epinephrine.  The spinal needle was inserted toward the target using a "trajectory" view along the fluoroscope beam.  Under AP and lateral visualization, the needle was advanced so it did not puncture dura and was located close the 6 O'Clock position of the pedical in AP tracterory. Biplanar projections were used to confirm position. Aspiration was confirmed to be negative for CSF and/or blood. A 1-2 ml. volume of Isovue-250 was injected and flow of contrast was noted at each level. Radiographs were obtained for documentation purposes.   After attaining the desired flow of contrast documented above, a 0.5 to 1.0 ml test dose of 0.25% Marcaine was injected into each respective transforaminal space.  The patient was observed for 90 seconds post injection.  After no sensory deficits were reported, and normal lower extremity motor function was noted,   the above injectate was administered so that equal amounts of the injectate were placed at each foramen (level) into the transforaminal epidural space.   Additional Comments:  The patient tolerated the procedure well Dressing: 2 x 2  sterile gauze and Band-Aid    Post-procedure details: Patient was observed during the procedure. Post-procedure instructions were reviewed.  Patient left the clinic in stable condition.      Clinical History: Low back pain asymmetric to the right. Radicular leg pain  EXAM: MRI LUMBAR SPINE WITHOUT CONTRAST  TECHNIQUE: Multiplanar, multisequence MR imaging of the lumbar spine was performed. No intravenous contrast was administered.  COMPARISON:  08/12/2019 radiography  FINDINGS: Segmentation:  Standard lumbar numbering  Alignment:  Mild levoscoliosis.  Vertebrae: L4 superior endplate fracture with superimposed Schmorl's node. This is nonacute based on radiography 08/12/2019; there is minimal adjacent marrow edema that is considered reactive. Suspect an adjacent atypical hemangioma.  Conus medullaris and cauda equina: Conus extends to the L1 level. Conus and cauda equina appear normal.  Paraspinal and other soft tissues: Infrarenal aortic aneurysm measuring at least 4.9 cm anterior to posterior, increased from an abdominal ultrasound when it measured 3.8 cm in diameter. Left renal cystic intensities.  Disc levels:  T12- L1: Noncompressive left paracentral protrusion.  L1-L2: Spondylosis with mild foraminal disc bulging.  No impingement  L2-L3: Disc narrowing and bulging.  No impingement  L3-L4: Disc narrowing and bulging with a right foraminal protrusion. Mild facet spurring. Bilateral subarticular recess narrowing with crowding of the descending L4 nerve roots. Moderate right foraminal stenosis.  L4-L5: Disc narrowing and bulging. Posterior element hypertrophy. The canal and foramina are patent  L5-S1:Disc narrowing and bulging with upward pointing central protrusion. No neural compression.  These results will be called to the ordering clinician or representative by the Radiologist Assistant, and communication documented in the PACS or  [employer].  IMPRESSION: 1. Infrarenal abdominal aortic aneurysm measuring at least 4.9 cm, increased from a 2018 abdominal ultrasound when it measured 3.8 cm. Recommend followup by abdomen and pelvis CTA in 6 months, and vascular surgery referral/consultation if not already obtained. This recommendation follows ACR consensus guidelines: White Paper of the ACR Incidental Findings Committee II on Vascular Findings. J Am Coll Radiol 2013; 10:789-794. Aortic aneurysm NOS (ICD10-I71.9) 2. L4 superior endplate fracture/Schmorl's node that is nonacute. 3. Multilevel disc bulge most notable at L3-4 there is bilateral subarticular recess narrowing and moderate right foraminal stenosis.   Electronically Signed   By: Monte Fantasia M.D.   On: 10/27/2019 11:31   He reports that he quit smoking about 20 months ago. His smoking use included cigarettes. He has a 53.00 pack-year smoking history. He has never used smokeless tobacco. No results for input(s): HGBA1C, LABURIC in the last 8760 hours.  Objective:  VS:  HT:    WT:   BMI:     BP:(!) 143/83  HR:81bpm  TEMP: ( )  RESP:  Physical Exam Constitutional:      General: He is not in acute distress.    Appearance: Normal appearance. He is not ill-appearing.  HENT:     Head: Normocephalic and atraumatic.     Right Ear: External ear normal.     Left Ear: External ear normal.  Eyes:     Extraocular Movements: Extraocular movements intact.  Cardiovascular:     Rate and Rhythm: Normal rate.     Pulses: Normal  pulses.  Abdominal:     General: There is no distension.     Palpations: Abdomen is soft.  Musculoskeletal:        General: No tenderness or signs of injury.     Right lower leg: No edema.     Left lower leg: No edema.     Comments: Patient has good distal strength without clonus.  Skin:    Findings: No erythema or rash.  Neurological:     General: No focal deficit present.     Mental Status: He is alert and oriented to  person, place, and time.     Sensory: No sensory deficit.     Motor: No weakness or abnormal muscle tone.     Coordination: Coordination normal.  Psychiatric:        Mood and Affect: Mood normal.        Behavior: Behavior normal.     Ortho Exam  Imaging: XR C-ARM NO REPORT  Result Date: 11/17/2019 Please see Notes tab for imaging impression.   Past Medical/Family/Surgical/Social History: Medications & Allergies reviewed per EMR, new medications updated. Patient Active Problem List   Diagnosis Date Noted  . Unstable angina ([place]) 01/10/2014  . Hyperlipidemia   . CAD (coronary artery disease)   . COPD (chronic obstructive pulmonary disease) ([place])   . AAA (abdominal aortic aneurysm) ([place])   . 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 Tobacco abuse   . 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 Pulmonary nodule 09/28/2008  . ANXIETY 09/25/2008  . Aortic valve disorder 09/25/2008   Past Medical History:  Diagnosis Date  . AAA (abdominal aortic aneurysm) ([place]) 04/16/2018   4.3 cm, noted on [place]   . Acute kidney failure ([place]) 2005 approx   after ureteroscopy, hospitalized for about 10 days afterwards  . Anxiety   . Arthritis    Hands, wrist, elbow, back and spine  . Bulging lumbar disc    L4-L5 degeneration, Chronic back pain  . CAD (coronary artery disease)    a. 01/09/14: [place] s/p DES to RCA   . COPD (chronic obstructive pulmonary disease) ([facility])    wheezing  . Dyspnea    with exertion  . Glaucoma of both eyes   . Hepatic cyst 2013   Several tiny hepatic cysts , noted on CT  . History of bilateral inguinal hernias   . History of kidney stones    small kidney stones in right kidney  . Hyperlipidemia   . Interstitial lung disease ([place])   . Leg pain    ABIs (10/15):  R 1.3, L 1.2, normal  . Orthopnea    uses 2 pillows  . Pneumonia   . Pulmonary nodule, right 08/02/2016   a. Right upper lobe  . Right ureteral stone   . Seasonal allergies   . 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 Aortosclerosis, bilateral - mild with calcification. Per Dr. Moreen Fowler at [facility]   . Tobacco abuse   . Wears dentures    upper   . Wears glasses    Family History  Problem Relation Age of Onset  . Hypertension Mother   . Heart attack Other        uncle  . Stroke Other   . Stroke Maternal Grandmother   . Stroke Maternal Grandfather   . Stroke Paternal Grandmother   . Stroke Paternal Grandfather    Past Surgical  History:  Procedure Laterality Date  . CARDIAC CATHETERIZATION  2017   right carotid stent  . CATARACT EXTRACTION W/ INTRAOCULAR LENS  IMPLANT, BILATERAL  1997 ([facility])  . CYSTOSCOPY/RETROGRADE/URETEROSCOPY/STONE EXTRACTION WITH BASKET  10/17/2011   Procedure: CYSTOSCOPY/RETROGRADE/URETEROSCOPY/STONE EXTRACTION WITH BASKET;  Surgeon: Hanley Ben, MD;  Location: [facility];  Service: Urology;  Laterality: Right;  . CYSTOSCOPY/URETEROSCOPY/HOLMIUM LASER/STENT PLACEMENT Right 05/13/2018   Procedure: CYSTOSCOPY/RETROGRADE/URETEROSCOPY/HOLMIUM LASER;  Surgeon: Kathie Rhodes, MD;  Location: [facility];  Service: Urology;  Laterality: Right;  . HERNIA REPAIR    . LAPAROSCOPIC LEFT INGUINAL (OBTURATOR) HERNIA AND UMBILICAL HERNIA REPAIR  01-13-2011  . LEFT HEART CATHETERIZATION WITH CORONARY ANGIOGRAM N/A 01/09/2014   Procedure: LEFT HEART CATHETERIZATION WITH CORONARY ANGIOGRAM;  Surgeon: Josue Hector, MD;  Location: [facility] CATH LAB;  Service: Cardiovascular;  Laterality: N/A;  . LEFT URETEROSCOPIC STONE EXTRACTION  11-30-2000   W/ PREVIOUS ESWL AND URETERAL STENT PLACEMENT  . REPAIR RIGHT INGUINAL HERNIA W/ MESH  12-31-2000  . TONSILLECTOMY    . URETEROLITHOTOMY  2009   Social History   Occupational History  . Not on file  Tobacco Use  . Smoking status: Former Smoker    Packs/day: 1.00    Years: 53.00    Pack years: 53.00    Types: Cigarettes    Quit date: 03/11/2018    Years since  quitting: 1.6  . Smokeless tobacco: Never Used  Vaping Use  . Vaping Use: Never used  Substance and Sexual Activity  . Alcohol use: No  . Drug use: No  . Sexual activity: Yes

## 2019-11-18 NOTE — Procedures (Signed)
Lumbosacral Transforaminal Epidural Steroid Injection - Sub-Pedicular Approach with Fluoroscopic Guidance  Patient: Connor Cuevas      Date of Birth: February 09, 1941 MRN: [id number] PCP: [name], MD      Visit Date: 11/17/2019   Universal Protocol:    Date/Time: 11/17/2019  Consent Given By: the patient  Position: PRONE  Additional Comments: Vital signs were monitored before and after the procedure. Patient was prepped and draped in the usual sterile fashion. The correct patient, procedure, and site was verified.   Injection Procedure Details:  Procedure Site One Meds Administered:  Meds ordered this encounter  Medications  . dexamethasone (DECADRON) injection 15 mg    Laterality: Right  Location/Site:  L3-L4  Needle size: 63 G  Needle type: Spinal  Needle Placement: Transforaminal  Findings:    -Comments: Excellent flow of contrast along the nerve, nerve root and into the epidural space.  Procedure Details: After squaring off the end-plates to get a true AP view, the C-arm was positioned so that an oblique view of the foramen as noted above was visualized. The target area is just inferior to the "nose of the scotty dog" or sub pedicular. The soft tissues overlying this structure were infiltrated with 2-3 ml. of 1% Lidocaine without Epinephrine.  The spinal needle was inserted toward the target using a "trajectory" view along the fluoroscope beam.  Under AP and lateral visualization, the needle was advanced so it did not puncture dura and was located close the 6 O'Clock position of the pedical in AP tracterory. Biplanar projections were used to confirm position. Aspiration was confirmed to be negative for CSF and/or blood. A 1-2 ml. volume of Isovue-250 was injected and flow of contrast was noted at each level. Radiographs were obtained for documentation purposes.   After attaining the desired flow of contrast documented above, a 0.5 to 1.0 ml test dose of 0.25%  Marcaine was injected into each respective transforaminal space.  The patient was observed for 90 seconds post injection.  After no sensory deficits were reported, and normal lower extremity motor function was noted,   the above injectate was administered so that equal amounts of the injectate were placed at each foramen (level) into the transforaminal epidural space.   Additional Comments:  The patient tolerated the procedure well Dressing: 2 x 2 sterile gauze and Band-Aid    Post-procedure details: Patient was observed during the procedure. Post-procedure instructions were reviewed.  Patient left the clinic in stable condition.

## 2019-12-02 DIAGNOSIS — E7800 Pure hypercholesterolemia, unspecified: Secondary | ICD-10-CM | POA: Diagnosis not present

## 2019-12-03 ENCOUNTER — Other Ambulatory Visit: Payer: Self-pay

## 2019-12-03 ENCOUNTER — Encounter: Payer: Self-pay | Admitting: Ophthalmology

## 2019-12-05 ENCOUNTER — Other Ambulatory Visit
Admission: RE | Admit: 2019-12-05 | Discharge: 2019-12-05 | Disposition: A | Discharge status: Home / Self Care | Payer: Medicare PPO | Source: Ambulatory Visit | Attending: Ophthalmology | Admitting: Ophthalmology

## 2019-12-05 ENCOUNTER — Other Ambulatory Visit: Payer: Self-pay

## 2019-12-05 DIAGNOSIS — Z01812 Encounter for preprocedural laboratory examination: Secondary | ICD-10-CM | POA: Insufficient documentation

## 2019-12-05 DIAGNOSIS — Z20822 Contact with and (suspected) exposure to covid-19: Secondary | ICD-10-CM | POA: Diagnosis not present

## 2019-12-05 LAB — SARS CORONAVIRUS 2 (TAT 6-24 HRS): SARS Coronavirus 2: NEGATIVE

## 2019-12-09 NOTE — Discharge Instructions (Signed)
General Anesthesia, Adult, Care After This sheet gives you information about how to care for yourself after your procedure. Your health care provider may also give you more specific instructions. If you have problems or questions, contact your health care provider. What can I expect after the procedure? After the procedure, the following side effects are common:  Pain or discomfort at the IV site.  Nausea.  Vomiting.  Sore throat.  Trouble concentrating.  Feeling cold or chills.  Weak or tired.  Sleepiness and fatigue.  Soreness and body aches. These side effects can affect parts of the body that were not involved in surgery. Follow these instructions at home:  For at least 24 hours after the procedure:  Have a responsible adult stay with you. It is important to have someone help care for you until you are awake and alert.  Rest as needed.  Do not: ? Participate in activities in which you could fall or become injured. ? Drive. ? Use heavy machinery. ? Drink alcohol. ? Take sleeping pills or medicines that cause drowsiness. ? Make important decisions or sign legal documents. ? Take care of children on your own. Eating and drinking  Follow any instructions from your health care provider about eating or drinking restrictions.  When you feel hungry, start by eating small amounts of foods that are soft and easy to digest (bland), such as toast. Gradually return to your regular diet.  Drink enough fluid to keep your urine pale yellow.  If you vomit, rehydrate by drinking water, juice, or clear broth. General instructions  If you have sleep apnea, surgery and certain medicines can increase your risk for breathing problems. Follow instructions from your health care provider about wearing your sleep device: ? Anytime you are sleeping, including during daytime naps. ? While taking prescription pain medicines, sleeping medicines, or medicines that make you drowsy.  Return to  your normal activities as told by your health care provider. Ask your health care provider what activities are safe for you.  Take over-the-counter and prescription medicines only as told by your health care provider.  If you smoke, do not smoke without supervision.  Keep all follow-up visits as told by your health care provider. This is important. Contact a health care provider if:  You have nausea or vomiting that does not get better with medicine.  You cannot eat or drink without vomiting.  You have pain that does not get better with medicine.  You are unable to pass urine.  You develop a skin rash.  You have a fever.  You have redness around your IV site that gets worse. Get help right away if:  You have difficulty breathing.  You have chest pain.  You have blood in your urine or stool, or you vomit blood. Summary  After the procedure, it is common to have a sore throat or nausea. It is also common to feel tired.  Have a responsible adult stay with you for the first 24 hours after general anesthesia. It is important to have someone help care for you until you are awake and alert.  When you feel hungry, start by eating small amounts of foods that are soft and easy to digest (bland), such as toast. Gradually return to your regular diet.  Drink enough fluid to keep your urine pale yellow.  Return to your normal activities as told by your health care provider. Ask your health care provider what activities are safe for you. This information is not   intended to replace advice given to you by your health care provider. Make sure you discuss any questions you have with your health care provider. Document Revised: 03/23/2017 Document Reviewed: 11/03/2016 Elsevier Patient Education  2020 [employer] Inc.  

## 2019-12-10 ENCOUNTER — Encounter: Payer: Self-pay | Admitting: Ophthalmology

## 2019-12-10 ENCOUNTER — Other Ambulatory Visit: Payer: Self-pay

## 2019-12-10 ENCOUNTER — Encounter: Payer: Self-pay | Admitting: Anesthesiology

## 2019-12-10 ENCOUNTER — Encounter
Admission: RE | Disposition: A | Discharge status: Home / Self Care | Payer: Self-pay | Source: Home / Self Care | Attending: Ophthalmology

## 2019-12-10 ENCOUNTER — Ambulatory Visit
Admission: RE | Admit: 2019-12-10 | Discharge: 2019-12-10 | Disposition: A | Discharge status: Home / Self Care | Payer: Medicare PPO | Attending: Ophthalmology | Admitting: Ophthalmology

## 2019-12-10 DIAGNOSIS — H401113 Primary open-angle glaucoma, right eye, severe stage: Secondary | ICD-10-CM | POA: Diagnosis not present

## 2019-12-10 DIAGNOSIS — E7800 Pure hypercholesterolemia, unspecified: Secondary | ICD-10-CM | POA: Diagnosis not present

## 2019-12-10 DIAGNOSIS — Z79899 Other long term (current) drug therapy: Secondary | ICD-10-CM | POA: Insufficient documentation

## 2019-12-10 DIAGNOSIS — Z881 Allergy status to other antibiotic agents status: Secondary | ICD-10-CM | POA: Insufficient documentation

## 2019-12-10 DIAGNOSIS — H4031X3 Glaucoma secondary to eye trauma, right eye, severe stage: Secondary | ICD-10-CM | POA: Diagnosis not present

## 2019-12-10 DIAGNOSIS — Z87891 Personal history of nicotine dependence: Secondary | ICD-10-CM | POA: Diagnosis not present

## 2019-12-10 DIAGNOSIS — J302 Other seasonal allergic rhinitis: Secondary | ICD-10-CM | POA: Diagnosis not present

## 2019-12-10 DIAGNOSIS — Z888 Allergy status to other drugs, medicaments and biological substances status: Secondary | ICD-10-CM | POA: Insufficient documentation

## 2019-12-10 DIAGNOSIS — Z7902 Long term (current) use of antithrombotics/antiplatelets: Secondary | ICD-10-CM | POA: Insufficient documentation

## 2019-12-10 DIAGNOSIS — Z85828 Personal history of other malignant neoplasm of skin: Secondary | ICD-10-CM | POA: Insufficient documentation

## 2019-12-10 HISTORY — DX: Orthopnea: R06.01

## 2019-12-10 HISTORY — DX: Dyspnea, unspecified: R06.00

## 2019-12-10 HISTORY — DX: Other seasonal allergic rhinitis: J30.2

## 2019-12-10 HISTORY — PX: PHOTOCOAGULATION: SHX5303

## 2019-12-10 SURGERY — PHOTOCOAGULATION
Anesthesia: Monitor Anesthesia Care | Site: Eye | Laterality: Right

## 2019-12-10 MED ORDER — LACTATED RINGERS IV SOLN: INTRAVENOUS | Status: DC

## 2019-12-10 MED ORDER — ALFENTANIL 500 MCG/ML IJ INJ
INJECTION | INTRAVENOUS | Status: DC | PRN
Start: 2019-12-10 — End: 2019-12-10
  Administered 2019-12-10: 500 ug via INTRAVENOUS

## 2019-12-10 MED ORDER — LIDOCAINE HCL 2 % IJ SOLN
INTRAMUSCULAR | Status: DC | PRN
  Administered 2019-12-10: 3 mL via OPHTHALMIC

## 2019-12-10 MED ORDER — MIDAZOLAM HCL 2 MG/2ML IJ SOLN
INTRAMUSCULAR | Status: DC | PRN
  Administered 2019-12-10: 2 mg via INTRAVENOUS

## 2019-12-10 SURGICAL SUPPLY — 8 items
DEVICE MICRO PULS P3 SGL USE (Laser) ×3 IMPLANT
GAUZE SPONGE 4X4 12PLY STRL (GAUZE/BANDAGES/DRESSINGS) ×3 IMPLANT
NDL FILTER BLUNT 18X1 1/2 (NEEDLE) ×1 IMPLANT
NDL RETROBULBAR .5 NSTRL (NEEDLE) ×3 IMPLANT
NEEDLE FILTER BLUNT 18X 1/2SAF (NEEDLE) ×2
NEEDLE FILTER BLUNT 18X1 1/2 (NEEDLE) ×1 IMPLANT
SYR 5ML LL (SYRINGE) ×3 IMPLANT
WATER STERILE IRR 250ML POUR (IV SOLUTION) ×3 IMPLANT

## 2019-12-10 NOTE — Anesthesia Preprocedure Evaluation (Addendum)
Anesthesia Evaluation  Patient identified by MRN, date of birth, ID band Patient awake    Reviewed: Allergy & Precautions, NPO status , Patient's Chart, lab work & pertinent test results  Airway Mallampati: II  TM Distance: >3 FB Neck ROM: Full    Dental no notable dental hx.    Pulmonary COPD, former smoker,    Pulmonary exam normal        Cardiovascular + CAD and + Peripheral Vascular Disease (iliac artery stenosis)  Normal cardiovascular exam  06/2019  Lexiscan stress without ST T Wave changes  Myovue scan with normal perfusion, minimal apical thinning  LVEF calculated at 49% though visual estimate suggests LVEF is greater  This is a low risk study.   Neuro/Psych Anxiety    GI/Hepatic negative GI ROS, Neg liver ROS,   Endo/Other  negative endocrine ROS  Renal/GU negative Renal ROS     Musculoskeletal  (+) Arthritis ,   Abdominal Normal abdominal exam  (+)   Peds  Hematology negative hematology ROS (+)   Anesthesia Other Findings Glaucoma secondary to eye trauma, right eye  Reproductive/Obstetrics                            Anesthesia Physical Anesthesia Plan  ASA: III  Anesthesia Plan: MAC   Post-op Pain Management:    Induction: Intravenous  PONV Risk Score and Plan: 1 and TIVA, Midazolam and Treatment may vary due to age or medical condition  Airway Management Planned: Natural Airway and Nasal Cannula  Additional Equipment: None  Intra-op Plan:   Post-operative Plan:   Informed Consent: I have reviewed the patients History and Physical, chart, labs and discussed the procedure including the risks, benefits and alternatives for the proposed anesthesia with the patient or authorized representative who has indicated his/her understanding and acceptance.     Dental advisory given  Plan Discussed with: CRNA  Anesthesia Plan Comments:         Anesthesia Quick  Evaluation

## 2019-12-10 NOTE — Anesthesia Procedure Notes (Signed)
Procedure Name: MAC Date/Time: 12/10/2019 1:08 PM Performed by: Jeannene Patella, CRNA Pre-anesthesia Checklist: Patient identified, Emergency Drugs available, Suction available, Timeout performed and Patient being monitored Patient Re-evaluated:Patient Re-evaluated prior to induction Oxygen Delivery Method: Nasal cannula Placement Confirmation: positive ETCO2

## 2019-12-10 NOTE — Transfer of Care (Signed)
Immediate Anesthesia Transfer of Care Note  Patient: Connor Cuevas  Procedure(s) Performed: Rowland Lathe DIODE CYCLOPHOTOCOAGULATION RIGHT (Right Eye)  Patient Location: PACU  Anesthesia Type: MAC  Level of Consciousness: awake, alert  and patient cooperative  Airway and Oxygen Therapy: Patient Spontanous Breathing and Patient connected to supplemental oxygen  Post-op Assessment: Post-op Vital signs reviewed, Patient's Cardiovascular Status Stable, Respiratory Function Stable, Patent Airway and No signs of Nausea or vomiting  Post-op Vital Signs: Reviewed and stable  Complications: No complications documented.

## 2019-12-10 NOTE — Op Note (Signed)
DATE OF SURGERY: 12/10/2019  PREOPERATIVE DIAGNOSES: Severe stage primary open angle glaucoma, right eye.      [id number]  POSTOPERATIVE DIAGNOSES: Same  PROCEDURES PERFORMED: Transscleral diode cyclophotocoagulation, right eye  SURGEON: [place] Karmina Zufall, M.D.  ANESTHESIA: Retrobulbar block of Xylocaine and Bupivacaine and Hyaluronidase  COMPLICATIONS: None.  INDICATIONS FOR PROCEDURE: Connor Cuevas is a 79 y.o. year-old male with uncontrolled primary open angle glaucoma. The risks and benefits of glaucoma surgery were discussed with the patient, and he consented for a diode laser surgery.  PROCEDURE IN DETAIL: The eye for surgery was verified during the time-out procedure in the operating room. A retrobulbar block of lidocaine, Marcaine, and hyaluronidase was done for anesthesia. A micropulse probe was applied to each hemilimbus with the following settings: 2074mW, 31.3% duty cycle, 120 seconds.  Topical atropine drops and Maxitrol ointment were placed on the eye.  The eye was pressure patched closed. The patient tolerated the procedure well and was transferred to the Post-operative Care Unit in stable condition.

## 2019-12-10 NOTE — Anesthesia Postprocedure Evaluation (Signed)
Anesthesia Post Note  Patient: Connor Cuevas  Procedure(s) Performed: Rowland Lathe DIODE CYCLOPHOTOCOAGULATION RIGHT (Right Eye)     Anesthesia Post Evaluation No complications documented.  Keevan Wolz [employer]

## 2019-12-10 NOTE — H&P (Signed)
  The History and Physical notes are on paper, have been signed, and are to be scanned. The patient remains stable and unchanged from the H&P.   Previous H&P reviewed, patient examined, and there are no changes.  Connor Cuevas 12/10/2019 11:55 AM

## 2019-12-11 ENCOUNTER — Encounter: Payer: Self-pay | Admitting: Ophthalmology

## 2019-12-16 DIAGNOSIS — Z23 Encounter for immunization: Secondary | ICD-10-CM | POA: Diagnosis not present

## 2020-03-12 DIAGNOSIS — H401133 Primary open-angle glaucoma, bilateral, severe stage: Secondary | ICD-10-CM | POA: Diagnosis not present

## 2020-03-16 DIAGNOSIS — H2513 Age-related nuclear cataract, bilateral: Secondary | ICD-10-CM | POA: Diagnosis not present

## 2020-03-16 DIAGNOSIS — H353132 Nonexudative age-related macular degeneration, bilateral, intermediate dry stage: Secondary | ICD-10-CM | POA: Diagnosis not present

## 2020-03-22 ENCOUNTER — Encounter: Payer: Self-pay | Admitting: Ophthalmology

## 2020-03-22 ENCOUNTER — Other Ambulatory Visit: Payer: Self-pay

## 2020-03-23 DIAGNOSIS — J449 Chronic obstructive pulmonary disease, unspecified: Secondary | ICD-10-CM | POA: Diagnosis not present

## 2020-03-23 DIAGNOSIS — H2512 Age-related nuclear cataract, left eye: Secondary | ICD-10-CM | POA: Diagnosis not present

## 2020-03-29 ENCOUNTER — Other Ambulatory Visit
Admission: RE | Admit: 2020-03-29 | Discharge: 2020-03-29 | Disposition: A | Discharge status: Home / Self Care | Payer: Medicare PPO | Source: Ambulatory Visit | Attending: Ophthalmology | Admitting: Ophthalmology

## 2020-03-29 ENCOUNTER — Other Ambulatory Visit: Payer: Self-pay

## 2020-03-29 DIAGNOSIS — Z20822 Contact with and (suspected) exposure to covid-19: Secondary | ICD-10-CM | POA: Diagnosis not present

## 2020-03-29 DIAGNOSIS — Z01812 Encounter for preprocedural laboratory examination: Secondary | ICD-10-CM | POA: Diagnosis not present

## 2020-03-29 LAB — SARS CORONAVIRUS 2 (TAT 6-24 HRS): SARS Coronavirus 2: NEGATIVE

## 2020-03-30 NOTE — Anesthesia Preprocedure Evaluation (Addendum)
Anesthesia Evaluation  Patient identified by MRN, date of birth, ID band Patient awake    Reviewed: Allergy & Precautions, NPO status , Patient's Chart, lab work & pertinent test results  History of Anesthesia Complications Negative for: history of anesthetic complications  Airway Mallampati: IV   Neck ROM: Full    Dental  (+) Upper Dentures Missing many lower teeth:   Pulmonary COPD, former smoker (quit 2019),    Pulmonary exam normal breath sounds clear to auscultation       Cardiovascular + CAD (s/p stents on Plavix) and + Peripheral Vascular Disease (iliac artery stenosis)  Normal cardiovascular exam Rhythm:Regular Rate:Normal  AAA, 4.5 cm on 04/29/19   Neuro/Psych PSYCHIATRIC DISORDERS Anxiety negative neurological ROS     GI/Hepatic negative GI ROS,   Endo/Other  negative endocrine ROS  Renal/GU Renal disease (nephrolithiasis)     Musculoskeletal  (+) Arthritis ,   Abdominal   Peds  Hematology negative hematology ROS (+)   Anesthesia Other Findings Cardiology note 05/02/19:  Assessment and Plan  CAD: DES to RCA 2015 Stable with no angina and good activity level.  Continue medical Rx Atypical Pain f/u lexiscan myouve as he has not had stress test in a long time   AAA:  4.5 cm by last duplex 04/29/19 f/u in a year   HLD:  On statin labs with primary  COPD:  With RUL lung nodule  Stable by CXR 08/02/16 f/u with primary   Sinus/Mailaise:  F/u ;primary will check lab work including CBC,CMET, and TSH    [name] MD [facility]  Reproductive/Obstetrics                            Anesthesia Physical Anesthesia Plan  ASA: III  Anesthesia Plan: MAC   Post-op Pain Management:    Induction: Intravenous  PONV Risk Score and Plan: 1 and TIVA, Midazolam and Treatment may vary due to age or medical condition  Airway Management Planned: Nasal Cannula  Additional Equipment:    Intra-op Plan:   Post-operative Plan:   Informed Consent: I have reviewed the patients History and Physical, chart, labs and discussed the procedure including the risks, benefits and alternatives for the proposed anesthesia with the patient or authorized representative who has indicated his/her understanding and acceptance.       Plan Discussed with: CRNA  Anesthesia Plan Comments:        Anesthesia Quick Evaluation

## 2020-03-30 NOTE — Discharge Instructions (Signed)

## 2020-03-31 ENCOUNTER — Encounter: Payer: Self-pay | Admitting: Ophthalmology

## 2020-03-31 ENCOUNTER — Ambulatory Visit
Admission: RE | Admit: 2020-03-31 | Discharge: 2020-03-31 | Disposition: A | Discharge status: Home / Self Care | Payer: Medicare PPO | Attending: Ophthalmology | Admitting: Ophthalmology

## 2020-03-31 ENCOUNTER — Other Ambulatory Visit: Payer: Self-pay

## 2020-03-31 ENCOUNTER — Encounter
Admission: RE | Disposition: A | Discharge status: Home / Self Care | Payer: Self-pay | Source: Home / Self Care | Attending: Ophthalmology

## 2020-03-31 ENCOUNTER — Ambulatory Visit: Payer: Medicare PPO | Admitting: Anesthesiology

## 2020-03-31 DIAGNOSIS — Z79899 Other long term (current) drug therapy: Secondary | ICD-10-CM | POA: Diagnosis not present

## 2020-03-31 DIAGNOSIS — Z7902 Long term (current) use of antithrombotics/antiplatelets: Secondary | ICD-10-CM | POA: Insufficient documentation

## 2020-03-31 DIAGNOSIS — Z9109 Other allergy status, other than to drugs and biological substances: Secondary | ICD-10-CM | POA: Diagnosis not present

## 2020-03-31 DIAGNOSIS — H2512 Age-related nuclear cataract, left eye: Secondary | ICD-10-CM | POA: Diagnosis not present

## 2020-03-31 DIAGNOSIS — Z87891 Personal history of nicotine dependence: Secondary | ICD-10-CM | POA: Diagnosis not present

## 2020-03-31 DIAGNOSIS — H25812 Combined forms of age-related cataract, left eye: Secondary | ICD-10-CM | POA: Diagnosis not present

## 2020-03-31 HISTORY — PX: CATARACT EXTRACTION W/PHACO: SHX586

## 2020-03-31 SURGERY — PHACOEMULSIFICATION, CATARACT, WITH IOL INSERTION
Anesthesia: Monitor Anesthesia Care | Site: Eye | Laterality: Left

## 2020-03-31 MED ORDER — BRIMONIDINE TARTRATE-TIMOLOL 0.2-0.5 % OP SOLN
OPHTHALMIC | Status: DC | PRN
  Administered 2020-03-31: 1 [drp] via OPHTHALMIC

## 2020-03-31 MED ORDER — NA HYALUR & NA CHOND-NA HYALUR 0.4-0.35 ML IO KIT
PACK | INTRAOCULAR | Status: DC | PRN
  Administered 2020-03-31: 1 mL via INTRAOCULAR

## 2020-03-31 MED ORDER — TETRACAINE HCL 0.5 % OP SOLN
1.0000 [drp] | OPHTHALMIC | Status: DC | PRN
  Administered 2020-03-31 (×3): 1 [drp] via OPHTHALMIC

## 2020-03-31 MED ORDER — LIDOCAINE HCL (PF) 2 % IJ SOLN
INTRAOCULAR | Status: DC | PRN
  Administered 2020-03-31: 2 mL

## 2020-03-31 MED ORDER — EPINEPHRINE PF 1 MG/ML IJ SOLN
INTRAOCULAR | Status: DC | PRN
  Administered 2020-03-31: 09:00:00 84 mL via OPHTHALMIC

## 2020-03-31 MED ORDER — ACETAMINOPHEN 325 MG PO TABS: 650.0000 mg | ORAL_TABLET | Freq: Once | ORAL | Status: DC | PRN

## 2020-03-31 MED ORDER — MIDAZOLAM HCL 2 MG/2ML IJ SOLN
INTRAMUSCULAR | Status: DC | PRN
  Administered 2020-03-31: 2 mg via INTRAVENOUS

## 2020-03-31 MED ORDER — FENTANYL CITRATE (PF) 100 MCG/2ML IJ SOLN
INTRAMUSCULAR | Status: DC | PRN
  Administered 2020-03-31 (×2): 50 ug via INTRAVENOUS

## 2020-03-31 MED ORDER — ACETAMINOPHEN 160 MG/5ML PO SOLN: 325.0000 mg | ORAL | Status: DC | PRN

## 2020-03-31 MED ORDER — MOXIFLOXACIN HCL 0.5 % OP SOLN
OPHTHALMIC | Status: DC | PRN
  Administered 2020-03-31: 0.2 mL via OPHTHALMIC

## 2020-03-31 MED ORDER — ARMC OPHTHALMIC DILATING DROPS
1.0000 "application " | OPHTHALMIC | Status: DC | PRN
  Administered 2020-03-31 (×3): 1 via OPHTHALMIC

## 2020-03-31 MED ORDER — ONDANSETRON HCL 4 MG/2ML IJ SOLN: 4.0000 mg | Freq: Once | INTRAMUSCULAR | Status: DC | PRN

## 2020-03-31 MED ORDER — LACTATED RINGERS IV SOLN: INTRAVENOUS | Status: DC

## 2020-03-31 SURGICAL SUPPLY — 23 items
CANNULA ANT/CHMB 27G (MISCELLANEOUS) ×1 IMPLANT
CANNULA ANT/CHMB 27GA (MISCELLANEOUS) ×3 IMPLANT
GLOVE SURG LX 7.5 STRW (GLOVE) ×2
GLOVE SURG LX STRL 7.5 STRW (GLOVE) ×1 IMPLANT
GLOVE SURG TRIUMPH 8.0 PF LTX (GLOVE) ×3 IMPLANT
GOWN STRL REUS W/ TWL LRG LVL3 (GOWN DISPOSABLE) ×2 IMPLANT
GOWN STRL REUS W/TWL LRG LVL3 (GOWN DISPOSABLE) ×6
LENS IOL DIOP 21.5 (Intraocular Lens) ×3 IMPLANT
LENS IOL TECNIS MONO 21.5 (Intraocular Lens) IMPLANT
MARKER SKIN DUAL TIP RULER LAB (MISCELLANEOUS) ×3 IMPLANT
NDL CAPSULORHEX 25GA (NEEDLE) ×1 IMPLANT
NDL FILTER BLUNT 18X1 1/2 (NEEDLE) ×2 IMPLANT
NEEDLE CAPSULORHEX 25GA (NEEDLE) ×3 IMPLANT
NEEDLE FILTER BLUNT 18X 1/2SAF (NEEDLE) ×4
NEEDLE FILTER BLUNT 18X1 1/2 (NEEDLE) ×2 IMPLANT
PACK CATARACT BRASINGTON (MISCELLANEOUS) ×3 IMPLANT
PACK EYE AFTER SURG (MISCELLANEOUS) ×3 IMPLANT
PACK OPTHALMIC (MISCELLANEOUS) ×3 IMPLANT
SOLUTION OPHTHALMIC SALT (MISCELLANEOUS) ×3 IMPLANT
SYR 3ML LL SCALE MARK (SYRINGE) ×6 IMPLANT
SYR TB 1ML LUER SLIP (SYRINGE) ×3 IMPLANT
WATER STERILE IRR 250ML POUR (IV SOLUTION) ×3 IMPLANT
WIPE NON LINTING 3.25X3.25 (MISCELLANEOUS) ×3 IMPLANT

## 2020-03-31 NOTE — H&P (Signed)

## 2020-03-31 NOTE — Op Note (Signed)
OPERATIVE NOTE  Connor Cuevas [id number] 03/31/2020   PREOPERATIVE DIAGNOSIS:  Nuclear sclerotic cataract left eye. H25.12   POSTOPERATIVE DIAGNOSIS:    Nuclear sclerotic cataract left eye.     PROCEDURE:  Phacoemusification with posterior chamber intraocular lens placement of the left eye  Ultrasound time: Procedure(s): CATARACT EXTRACTION PHACO AND INTRAOCULAR LENS PLACEMENT (IOC) LEFT 5.51 01:16.1 7.2% (Left)  LENS:   Implant Name Type Inv. Item Serial No. Manufacturer Lot No. LRB No. Used Action  LENS IOL DIOP 21.5 - [id number] Intraocular Lens LENS IOL DIOP 21.5 [id number] JOHNSON   Left 1 Implanted      SURGEON:  Deirdre Evener, MD   ANESTHESIA:  Topical with tetracaine drops and 2% Xylocaine jelly, augmented with 1% preservative-free intracameral lidocaine.    COMPLICATIONS:  None.   DESCRIPTION OF PROCEDURE:  The patient was identified in the holding room and transported to the operating room and placed in the supine position under the operating microscope.  The left eye was identified as the operative eye and it was prepped and draped in the usual sterile ophthalmic fashion.   A 1 millimeter clear-corneal paracentesis was made at the 1:30 position.  0.5 ml of preservative-free 1% lidocaine was injected into the anterior chamber.  The anterior chamber was filled with Viscoat viscoelastic.  A 2.4 millimeter keratome was used to make a near-clear corneal incision at the 10:30 position.  .  A curvilinear capsulorrhexis was made with a cystotome and capsulorrhexis forceps.  Balanced salt solution was used to hydrodissect and hydrodelineate the nucleus.   Phacoemulsification was then used in stop and chop fashion to remove the lens nucleus and epinucleus.  The remaining cortex was then removed using the irrigation and aspiration handpiece. Provisc was then placed into the capsular bag to distend it for lens placement.  A lens was then injected into the capsular bag.  The  remaining viscoelastic was aspirated.   Wounds were hydrated with balanced salt solution.  The anterior chamber was inflated to a physiologic pressure with balanced salt solution.  No wound leaks were noted. Vigamox 0.2 ml of a 1mg  per ml solution was injected into the anterior chamber for a dose of 0.2 mg of intracameral antibiotic at the completion of the case.   Timolol and Brimonidine drops were applied to the eye.  The patient was taken to the recovery room in stable condition without complications of anesthesia or surgery.  [name] 03/31/2020, 8:59 AM

## 2020-03-31 NOTE — Anesthesia Procedure Notes (Signed)
Procedure Name: MAC Date/Time: 03/31/2020 8:43 AM Performed by: Jeannene Patella, CRNA Pre-anesthesia Checklist: Patient identified, Emergency Drugs available, Suction available, Timeout performed and Patient being monitored Patient Re-evaluated:Patient Re-evaluated prior to induction Oxygen Delivery Method: Nasal cannula Placement Confirmation: positive ETCO2

## 2020-03-31 NOTE — Anesthesia Postprocedure Evaluation (Signed)
Anesthesia Post Note  Patient: Connor Cuevas  Procedure(s) Performed: CATARACT EXTRACTION PHACO AND INTRAOCULAR LENS PLACEMENT (IOC) LEFT 5.51 01:16.1 7.2% (Left Eye)     Patient location during evaluation: PACU Anesthesia Type: MAC Level of consciousness: awake and alert, oriented and patient cooperative Pain management: pain level controlled Vital Signs Assessment: post-procedure vital signs reviewed and stable Respiratory status: spontaneous breathing, nonlabored ventilation and respiratory function stable Cardiovascular status: blood pressure returned to baseline and stable Postop Assessment: adequate PO intake Anesthetic complications: no   No complications documented.  Darrin Nipper

## 2020-03-31 NOTE — Transfer of Care (Signed)
Immediate Anesthesia Transfer of Care Note  Patient: Connor Cuevas  Procedure(s) Performed: CATARACT EXTRACTION PHACO AND INTRAOCULAR LENS PLACEMENT (IOC) LEFT 5.51 01:16.1 7.2% (Left Eye)  Patient Location: PACU  Anesthesia Type: MAC  Level of Consciousness: awake, alert  and patient cooperative  Airway and Oxygen Therapy: Patient Spontanous Breathing and Patient connected to supplemental oxygen  Post-op Assessment: Post-op Vital signs reviewed, Patient's Cardiovascular Status Stable, Respiratory Function Stable, Patent Airway and No signs of Nausea or vomiting  Post-op Vital Signs: Reviewed and stable  Complications: No complications documented.

## 2020-04-01 ENCOUNTER — Encounter: Payer: Self-pay | Admitting: Ophthalmology

## 2020-04-06 DIAGNOSIS — H353132 Nonexudative age-related macular degeneration, bilateral, intermediate dry stage: Secondary | ICD-10-CM | POA: Diagnosis not present

## 2020-04-13 DIAGNOSIS — F17210 Nicotine dependence, cigarettes, uncomplicated: Secondary | ICD-10-CM | POA: Diagnosis not present

## 2020-04-13 DIAGNOSIS — H401133 Primary open-angle glaucoma, bilateral, severe stage: Secondary | ICD-10-CM | POA: Diagnosis not present

## 2020-04-13 DIAGNOSIS — H401113 Primary open-angle glaucoma, right eye, severe stage: Secondary | ICD-10-CM | POA: Diagnosis not present

## 2020-04-13 DIAGNOSIS — I2510 Atherosclerotic heart disease of native coronary artery without angina pectoris: Secondary | ICD-10-CM | POA: Diagnosis not present

## 2020-04-13 DIAGNOSIS — E785 Hyperlipidemia, unspecified: Secondary | ICD-10-CM | POA: Diagnosis not present

## 2020-04-13 DIAGNOSIS — I10 Essential (primary) hypertension: Secondary | ICD-10-CM | POA: Diagnosis not present

## 2020-04-13 DIAGNOSIS — J449 Chronic obstructive pulmonary disease, unspecified: Secondary | ICD-10-CM | POA: Diagnosis not present

## 2020-04-21 DIAGNOSIS — J441 Chronic obstructive pulmonary disease with (acute) exacerbation: Secondary | ICD-10-CM | POA: Diagnosis not present

## 2020-04-21 DIAGNOSIS — Z20822 Contact with and (suspected) exposure to covid-19: Secondary | ICD-10-CM | POA: Diagnosis not present

## 2020-04-21 DIAGNOSIS — R0602 Shortness of breath: Secondary | ICD-10-CM | POA: Diagnosis not present

## 2020-04-21 DIAGNOSIS — R059 Cough, unspecified: Secondary | ICD-10-CM | POA: Diagnosis not present

## 2020-04-23 DIAGNOSIS — J019 Acute sinusitis, unspecified: Secondary | ICD-10-CM | POA: Diagnosis not present

## 2020-04-27 ENCOUNTER — Ambulatory Visit: Payer: Medicare PPO | Admitting: Cardiovascular Disease

## 2020-04-27 DIAGNOSIS — R000 Tachycardia, unspecified: Secondary | ICD-10-CM | POA: Diagnosis not present

## 2020-04-27 DIAGNOSIS — Z955 Presence of coronary angioplasty implant and graft: Secondary | ICD-10-CM | POA: Diagnosis not present

## 2020-04-27 DIAGNOSIS — F419 Anxiety disorder, unspecified: Secondary | ICD-10-CM | POA: Diagnosis not present

## 2020-04-27 DIAGNOSIS — M1990 Unspecified osteoarthritis, unspecified site: Secondary | ICD-10-CM | POA: Diagnosis not present

## 2020-04-27 DIAGNOSIS — J449 Chronic obstructive pulmonary disease, unspecified: Secondary | ICD-10-CM | POA: Diagnosis not present

## 2020-04-27 DIAGNOSIS — Z87442 Personal history of urinary calculi: Secondary | ICD-10-CM | POA: Diagnosis not present

## 2020-04-27 DIAGNOSIS — I2510 Atherosclerotic heart disease of native coronary artery without angina pectoris: Secondary | ICD-10-CM | POA: Diagnosis not present

## 2020-04-27 DIAGNOSIS — E782 Mixed hyperlipidemia: Secondary | ICD-10-CM | POA: Diagnosis not present

## 2020-04-27 DIAGNOSIS — H5440 Blindness, one eye, unspecified eye: Secondary | ICD-10-CM | POA: Diagnosis not present

## 2020-04-27 NOTE — Progress Notes (Signed)
Patient ID: Connor Cuevas, male   DOB: [birth year], 80 y.o.   MRN: [id number]      79 y.o. with history of COPD, RUL nodule previous smoker. CAD with DES to the mid RCA in October 2015. Myovue done 06/05/19 was non ischemic and low risk  On statin for HLD. Needs f/u US abdomen 04/29/19 AAA 4.5 cm  Had MRI spine for back pain and noted AAA 4.9 cm   No chest pain Compliant with meds   05/13/18 had ureteroscopic procedure for nephrolithiasis. No issues holding plavix   Has had sinus infection most of January with malaise. 2 courses of antibiotics and prednisone  Had cataract surgery 03/31/20 on left eye  Chronic back pain with ? 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 No abdominal pain    ROS: Denies fever, malais, weight loss, blurry vision, decreased visual acuity, cough, sputum, SOB, hemoptysis, pleuritic pain, palpitaitons, heartburn, abdominal pain, melena, lower extremity edema, claudication, or rash.  All other systems reviewed and negative  General: BP (!) 140/98   Pulse (!) 103   Ht 5\' 9"  (1.753 m)   Wt 59.4 kg   SpO2 98%   BMI 19.35 kg/m  Affect appropriate Healthy:  appears stated age HEENT: normal Neck supple with no adenopathy JVP normal no bruits no thyromegaly Lungs clear with no wheezing and good diaphragmatic motion Heart:  S1/S2 no murmur, no rub, gallop or click PMI normal Abdomen: benighn, BS positve, no tenderness, abdominal aorta palpable not tender  no bruit.  No HSM or HJR Distal pulses intact with no bruits No edema Neuro non-focal Skin warm and dry No muscular weakness  Current Outpatient Medications  Medication Sig Dispense Refill  . atenolol (TENORMIN) 25 MG tablet Take 1 tablet (25 mg total) by mouth daily. 90 tablet 3  . atorvastatin (LIPITOR) 40 MG tablet  Take 40 mg by mouth every morning.     . brimonidine (ALPHAGAN P) 0.1 % SOLN Place 1 drop into the right eye 2 (two) times daily.    . clopidogrel (PLAVIX) 75 MG tablet Take 1 tablet by mouth once daily 90 tablet 0  . Coenzyme Q10 (CO Q 10) 100 MG CAPS Take 100 mg by mouth daily.    . dorzolamide-timolol (COSOPT) 22.3-6.8 MG/ML ophthalmic solution Place 1 drop into both eyes 2 (two) times daily.    Marland Kitchen gabapentin (NEURONTIN) 100 MG capsule Take 1 capsule (100 mg total) by mouth 3 (three) times daily. (Patient taking differently: Take 100 mg by mouth 2 (two) times daily.) 90 capsule 0  . HYDROcodone-acetaminophen (NORCO) 5-325 MG per tablet Take 0.5 tablets by mouth every 6 (six) hours as needed for moderate pain. For pain    . latanoprost (XALATAN) 0.005 % ophthalmic solution 1 drop at bedtime.    . methocarbamol (ROBAXIN) 500 MG tablet TAKE 1 TABLET BY MOUTH EVERY 8 HOURS AS NEEDED 30 tablet 0  . Multiple Vitamins-Minerals (ICAPS AREDS FORMULA PO) Take 2 tablets by mouth daily.     . nitroGLYCERIN (NITROSTAT) 0.4 MG SL tablet Place 1 tablet (0.4 mg total) under the tongue every 5 (five) minutes as needed for chest pain. 25 tablet 12  . OMEGA-3 KRILL OIL PO Take 200 mg by mouth daily.    Marland Kitchen PROAIR HFA 108 (90  Base) MCG/ACT inhaler INHALE 2 PUFFS BY MOUTH EVERY 4 HOURS AS NEEDED FOR WHEEZING    . pseudoephedrine (SUDAFED) 120 MG 12 hr tablet Take 120 mg by mouth 2 (two) times daily.    Marland Kitchen tiotropium (SPIRIVA) 18 MCG inhalation capsule Place 18 mcg into inhaler and inhale every morning.    . traZODone (DESYREL) 100 MG tablet Take 100 mg by mouth at bedtime.      No current facility-administered medications for this visit.    Allergies  Ceclor [cefaclor], Macrobid [nitrofurantoin], and Chantix [varenicline]  Electrocardiogram:   05/04/2020 SR rate 103 PaC otherwise normal  Assessment and Plan  CAD: DES to RCA 2015  Continue medical Rx  Myovue done 06/05/19 normal no ischemia HR Up today he has been  out of his Atenolol - refilled   AAA:  4.5 cm by last duplex 04/29/19 ? 4.9 cm on MRI done for back on 10/27/19 US done 04/29/20 Now 5.3 cm refer to Dr Carlis Abbott for possible stent graft intervention   HLD:  On statin labs with primary LDL 145 increase lipitor to 80 mg daily   COPD:  With RUL lung nodule  Stable by CXR 08/02/16 f/u with primary   Sinus/Mailaise:  F/u ;primary will check lab work including CBC,CMET, and TSH   F/U 6 months  Refer to [facility]   [name] MD [facility]

## 2020-04-29 ENCOUNTER — Ambulatory Visit (HOSPITAL_COMMUNITY)
Admission: RE | Admit: 2020-04-29 | Discharge: 2020-04-29 | Disposition: A | Discharge status: Home / Self Care | Payer: Medicare PPO | Source: Ambulatory Visit | Attending: Cardiology | Admitting: Cardiology

## 2020-04-29 ENCOUNTER — Other Ambulatory Visit: Payer: Self-pay

## 2020-04-29 ENCOUNTER — Telehealth: Payer: Self-pay

## 2020-04-29 DIAGNOSIS — I209 Angina pectoris, unspecified: Secondary | ICD-10-CM

## 2020-04-29 DIAGNOSIS — I714 Abdominal aortic aneurysm, without rupture, unspecified: Secondary | ICD-10-CM

## 2020-04-29 NOTE — Telephone Encounter (Signed)
Left message for patient to call back.  Placed order for referral to [facility].

## 2020-04-29 NOTE — Telephone Encounter (Signed)
-----   Message from Josue Hector, MD sent at 04/29/2020  9:20 AM EST ----- Regarding: RE: AAA 5.3 Needs referral to [facility] for expanding AAA next 2 weeks or so  ----- Message ----- From: Freida Busman, RVT Sent: 04/29/2020   8:41 AM EST To: Josue Hector, MD Subject: AAA 5.3                                        There is evidence of abnormal dilatation of the mid and distal Abdominal aorta.   The largest aortic measurement is 5.3 cm.   This has increased from 4.7 cm in 04/2019.  He has an appointment with you 05/04/2020. I just wanted to give you a heads up on these results.  Have a great day  Gina Gealow RVT [facility]

## 2020-04-29 NOTE — Telephone Encounter (Signed)
Connor Cuevas is returning Connor Cuevas's call. Please advise.

## 2020-04-29 NOTE — Telephone Encounter (Signed)
Patient aware of results.

## 2020-05-04 ENCOUNTER — Ambulatory Visit: Payer: Medicare PPO | Admitting: Cardiovascular Disease

## 2020-05-04 ENCOUNTER — Encounter: Payer: Self-pay | Admitting: Cardiovascular Disease

## 2020-05-04 ENCOUNTER — Other Ambulatory Visit: Payer: Self-pay

## 2020-05-04 VITALS — BP 140/98 | HR 103 | Ht 69.0 in | Wt 131.0 lb

## 2020-05-04 DIAGNOSIS — E782 Mixed hyperlipidemia: Secondary | ICD-10-CM | POA: Diagnosis not present

## 2020-05-04 DIAGNOSIS — I2510 Atherosclerotic heart disease of native coronary artery without angina pectoris: Secondary | ICD-10-CM | POA: Diagnosis not present

## 2020-05-04 DIAGNOSIS — I714 Abdominal aortic aneurysm, without rupture, unspecified: Secondary | ICD-10-CM

## 2020-05-04 MED ORDER — ATENOLOL 25 MG PO TABS: 25.0000 mg | ORAL_TABLET | Freq: Every day | ORAL | 3 refills | Status: DC

## 2020-05-04 MED ORDER — ATORVASTATIN CALCIUM 80 MG PO TABS: 80.0000 mg | ORAL_TABLET | ORAL | 3 refills | Status: AC

## 2020-05-04 NOTE — Patient Instructions (Addendum)
Medication Instructions:  Your physician has recommended you make the following change in your medication:  1-INCREASE Lipitor 80 mg by mouth daily.  *If you need a refill on your cardiac medications before your next appointment, please call your pharmacy*  Lab Work: Your physician recommends that you return for lab work in: 3 months for fasting lipid and liver panel.  If you have labs (blood work) drawn today and your tests are completely normal, you will receive your results only by: Marland Kitchen MyChart Message (if you have MyChart) OR . A paper copy in the mail If you have any lab test that is abnormal or we need to change your treatment, we will call you to review the results.  Follow-Up: At [facility], you and your health needs are our priority.  As part of our continuing mission to provide you with exceptional heart care, we have created designated Provider Care Teams.  These Care Teams include your primary Cardiologist (physician) and Advanced Practice Providers (APPs -  Physician Assistants and Nurse Practitioners) who all work together to provide you with the care you need, when you need it.  We recommend signing up for the patient portal called "MyChart".  Sign up information is provided on this After Visit Summary.  MyChart is used to connect with patients for Virtual Visits (Telemedicine).  Patients are able to view lab/test results, encounter notes, upcoming appointments, etc.  Non-urgent messages can be sent to your provider as well.   To learn more about what you can do with MyChart, go to [email or web address].    Your next appointment:   6 month(s)  The format for your next appointment:   In Person  Provider:   You may see [name], MD or one of the following Advanced Practice Providers on your designated Care Team:    Kathyrn Drown, NP    Other Instructions Your physician recommends that you schedule a follow-up appointment as soon as possible with Dr. Carlis Abbott at [facility]  at [phone]

## 2020-05-07 DIAGNOSIS — Z961 Presence of intraocular lens: Secondary | ICD-10-CM | POA: Diagnosis not present

## 2020-05-18 ENCOUNTER — Encounter: Payer: Self-pay | Admitting: Vascular Surgery

## 2020-05-18 ENCOUNTER — Ambulatory Visit: Payer: Medicare PPO | Admitting: Vascular Surgery

## 2020-05-18 ENCOUNTER — Other Ambulatory Visit: Payer: Self-pay

## 2020-05-18 VITALS — BP 156/92 | HR 96 | Temp 97.6°F | Resp 16 | Ht 69.0 in | Wt 128.0 lb

## 2020-05-18 DIAGNOSIS — I714 Abdominal aortic aneurysm, without rupture, unspecified: Secondary | ICD-10-CM

## 2020-05-18 NOTE — Progress Notes (Signed)
Patient name: Connor Cuevas MRN: [id number] DOB: 10-03-1940 Sex: male  REASON FOR CONSULT: F/U to discuss enlarging abdominal aortic aneurysm  HPI: Connor Cuevas is a 80 y.o. male, with history of coronary artery disease status post PCI and COPD that presents from his cardiologist Dr. [name] to discuss enlarging AAA.  Patient has a known AAA since 2014.  I last saw him in 2020 and it measured 4.4 cm.  He has had several duplexes since I last saw him and it measured 4.7 cm in January 2021 and most recently enlarged to 5.3 cm in January 2022 at his cardiologist office.  He denies any new abdominal pain.  He has his chronic baseline lower back pain.  Him and his wife state they have been very worried about his aneurysm.  Past Medical History:  Diagnosis Date  . AAA (abdominal aortic aneurysm) ([place]) 04/16/2018   4.3 cm, noted on [place]   . Acute kidney failure ([place]) 2005 approx   after ureteroscopy, hospitalized for about 10 days afterwards  . Anxiety   . Arthritis    Hands, wrist, elbow, back and spine  . Bulging lumbar disc    L4-L5 degeneration, Chronic back pain  . CAD (coronary artery disease)    a. 01/09/14: [place] s/p DES to RCA   . COPD (chronic obstructive pulmonary disease) (H[facility])    wheezing  . Dyspnea    with exertion  . Glaucoma of both eyes   . Hepatic cyst 2013   Several tiny hepatic cysts , noted on CT  . History of bilateral inguinal hernias   . History of kidney stones    small kidney stones in right kidney  . Hyperlipidemia   . Interstitial lung disease ([place])   . Leg pain    ABIs (10/15):  R 1.3, L 1.2, normal  . Orthopnea    uses 2 pillows  . Pneumonia   . Pulmonary nodule, right 08/02/2016   a. Right upper lobe  . Right ureteral stone   . Seasonal allergies   . 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 Aortosclerosis, bilateral - mild with calcification. Per Dr. Moreen Fowler at [facility]   . Tobacco abuse   .  Wears dentures    upper   . Wears glasses     Past Surgical History:  Procedure Laterality Date  . CARDIAC CATHETERIZATION  2017   right carotid stent  . CATARACT EXTRACTION W/ INTRAOCULAR LENS  IMPLANT, BILATERAL  1997 (APPROX)  . CATARACT EXTRACTION W/PHACO Left 03/31/2020   Procedure: CATARACT EXTRACTION PHACO AND INTRAOCULAR LENS PLACEMENT (IOC) LEFT 5.51 01:16.1 7.2%;  Surgeon: Leandrew Koyanagi, MD;  Location: [place];  Service: Ophthalmology;  Laterality: Left;  . CYSTOSCOPY/RETROGRADE/URETEROSCOPY/STONE EXTRACTION WITH BASKET  10/17/2011   Procedure: CYSTOSCOPY/RETROGRADE/URETEROSCOPY/STONE EXTRACTION WITH BASKET;  Surgeon: Hanley Ben, MD;  Location: [place];  Service: Urology;  Laterality: Right;  . CYSTOSCOPY/URETEROSCOPY/HOLMIUM LASER/STENT PLACEMENT Right 05/13/2018   Procedure: CYSTOSCOPY/RETROGRADE/URETEROSCOPY/HOLMIUM LASER;  Surgeon: Kathie Rhodes, MD;  Location: [facility];  Service: Urology;  Laterality: Right;  . HERNIA REPAIR    . LAPAROSCOPIC LEFT INGUINAL (OBTURATOR) HERNIA AND UMBILICAL HERNIA REPAIR  01-13-2011  . LEFT HEART CATHETERIZATION WITH CORONARY ANGIOGRAM N/A 01/09/2014   Procedure: LEFT HEART CATHETERIZATION WITH CORONARY ANGIOGRAM;  Surgeon: Josue Hector, MD;  Location[facility];  Service: Cardiovascular;  Laterality: N/A;  . LEFT  URETEROSCOPIC STONE EXTRACTION  11-30-2000   W/ PREVIOUS ESWL AND URETERAL STENT PLACEMENT  . PHOTOCOAGULATION Right 12/10/2019   Procedure: TRANSCLEARAL DIODE CYCLOPHOTOCOAGULATION RIGHT;  Surgeon: Leandrew Koyanagi, MD;  Location: [place];  Service: Ophthalmology;  Laterality: Right;  . REPAIR RIGHT INGUINAL HERNIA W/ MESH  12-31-2000  . TONSILLECTOMY    . URETEROLITHOTOMY  2009    Family History  Problem Relation Age of Onset  . Hypertension Mother   . Heart attack Other        uncle  . Stroke Other   . Stroke Maternal Grandmother   . Stroke  Maternal Grandfather   . Stroke Paternal Grandmother   . Stroke Paternal Grandfather     SOCIAL HISTORY: Social History   Socioeconomic History  . Marital status: Married    Spouse name: Not on file  . Number of children: Not on file  . Years of education: Not on file  . Highest education level: Not on file  Occupational History  . Not on file  Tobacco Use  . Smoking status: Former Smoker    Packs/day: 1.00    Years: 53.00    Pack years: 53.00    Types: Cigarettes    Quit date: 03/11/2018    Years since quitting: 2.1  . Smokeless tobacco: Never Used  Vaping Use  . Vaping Use: Never used  Substance and Sexual Activity  . Alcohol use: No  . Drug use: No  . Sexual activity: Yes  Other Topics Concern  . Not on file  Social History Narrative  . Not on file   Social Determinants of Health   Financial Resource Strain: Not on file  Food Insecurity: Not on file  Transportation Needs: Not on file  Physical Activity: Not on file  Stress: Not on file  Social Connections: Not on file  Intimate Partner Violence: Not on file    Allergies  Allergen Reactions  . Cefaclor Anaphylaxis, Rash and Swelling    Throat closed  . Nitrofurantoin Anaphylaxis    Right kidney shut down   . Chantix [Varenicline] Other (See Comments)    nightmares  . Nsaids Other (See Comments)  . Other Other (See Comments)    Current Outpatient Medications  Medication Sig Dispense Refill  . atenolol (TENORMIN) 25 MG tablet Take 1 tablet (25 mg total) by mouth daily. 90 tablet 3  . atorvastatin (LIPITOR) 80 MG tablet Take 1 tablet (80 mg total) by mouth every morning. 90 tablet 3  . brimonidine (ALPHAGAN P) 0.1 % SOLN Place 1 drop into the right eye 2 (two) times daily.    . clopidogrel (PLAVIX) 75 MG tablet Take 1 tablet by mouth once daily 90 tablet 0  . Coenzyme Q10 (CO Q 10) 100 MG CAPS Take 100 mg by mouth daily.    . dorzolamide-timolol (COSOPT) 22.3-6.8 MG/ML ophthalmic solution Place 1 drop  into both eyes 2 (two) times daily.    Marland Kitchen gabapentin (NEURONTIN) 100 MG capsule Take 1 capsule (100 mg total) by mouth 3 (three) times daily. (Patient taking differently: Take 100 mg by mouth 2 (two) times daily.) 90 capsule 0  . HYDROcodone-acetaminophen (NORCO) 5-325 MG per tablet Take 0.5 tablets by mouth every 6 (six) hours as needed for moderate pain. For pain    . latanoprost (XALATAN) 0.005 % ophthalmic solution 1 drop at bedtime.    . methocarbamol (ROBAXIN) 500 MG tablet TAKE 1 TABLET BY MOUTH EVERY 8 HOURS AS NEEDED 30 tablet 0  .  Multiple Vitamins-Minerals (ICAPS AREDS FORMULA PO) Take 2 tablets by mouth daily.     . nitroGLYCERIN (NITROSTAT) 0.4 MG SL tablet Place 1 tablet (0.4 mg total) under the tongue every 5 (five) minutes as needed for chest pain. 25 tablet 12  . OMEGA-3 KRILL OIL PO Take 200 mg by mouth daily.    Marland Kitchen PROAIR HFA 108 (90 Base) MCG/ACT inhaler INHALE 2 PUFFS BY MOUTH EVERY 4 HOURS AS NEEDED FOR WHEEZING    . pseudoephedrine (SUDAFED) 120 MG 12 hr tablet Take 120 mg by mouth 2 (two) times daily.    Marland Kitchen tiotropium (SPIRIVA) 18 MCG inhalation capsule Place 18 mcg into inhaler and inhale every morning.    . traZODone (DESYREL) 100 MG tablet Take 100 mg by mouth at bedtime.      No current facility-administered medications for this visit.    REVIEW OF SYSTEMS:  [X]  denotes positive finding, [ ]  denotes negative finding Cardiac  Comments:  Chest pain or chest pressure:    Shortness of breath upon exertion:    Short of breath when lying flat:    Irregular heart rhythm:        Vascular    Pain in calf, thigh, or hip brought on by ambulation:    Pain in feet at night that wakes you up from your sleep:     Blood clot in your veins:    Leg swelling:         Pulmonary    Oxygen at home:    Productive cough:     Wheezing:         Neurologic    Sudden weakness in arms or legs:     Sudden numbness in arms or legs:     Sudden onset of difficulty speaking or slurred  speech:    Temporary loss of vision in one eye:     Problems with dizziness:         Gastrointestinal    Blood in stool:     Vomited blood:         Genitourinary    Burning when urinating:     Blood in urine:        Psychiatric    Major depression:         Hematologic    Bleeding problems:    Problems with blood clotting too easily:        Skin    Rashes or ulcers:        Constitutional    Fever or chills:      PHYSICAL EXAM: Vitals:   05/18/20 1012  BP: (!) 156/92  Pulse: 96  Resp: 16  Temp: 97.6 F (36.4 C)  TempSrc: Temporal  SpO2: 95%  Weight: 128 lb (58.1 kg)  Height: 5\' 9"  (1.753 m)    GENERAL: The patient is a well-nourished male, in no acute distress. The vital signs are documented above. CARDIAC: There is a regular rate and rhythm.  VASCULAR:  2+ femoral pulse palpable bilateral groins 2+ palpable DP pulses bilatearlly No tissue loss PULMONARY: There is good air exchange bilaterally without wheezing or rales. ABDOMEN: Soft and non-tender.  No pain with palpation of aneurysm. MUSCULOSKELETAL: There are no major deformities or cyanosis. NEUROLOGIC: No focal weakness or paresthesias are detected.   DATA:   Most recent AAA duplex from 04/29/2020 shows his aneurysm has increased from 4.7 cm to 5.3 cm.  Assessment/Plan:  80 year old male presents for evaluation of enlarging abdominal aortic aneurysm now increased  from 4.7 cm in January 2021 to 5.3 cm in January 2022.  I discussed we could certainly arrange follow-up duplex again in 6 months with another AAA duplex but he is very worried about his aneurysm as is his wife.  Ultimately we agreed to get a CTA abdomen pelvis for more accurate measurements and to evaluate if he has the option of stent graft repair.  He would like to proceed with repair if possible.  I will see him after CT complete.   Marty Heck, MD [facility] Office: [place]

## 2020-05-19 ENCOUNTER — Other Ambulatory Visit: Payer: Self-pay

## 2020-05-19 DIAGNOSIS — I713 Abdominal aortic aneurysm, ruptured, unspecified: Secondary | ICD-10-CM

## 2020-05-27 DIAGNOSIS — J441 Chronic obstructive pulmonary disease with (acute) exacerbation: Secondary | ICD-10-CM | POA: Diagnosis not present

## 2020-05-27 DIAGNOSIS — J0100 Acute maxillary sinusitis, unspecified: Secondary | ICD-10-CM | POA: Diagnosis not present

## 2020-06-09 ENCOUNTER — Encounter: Payer: Self-pay | Admitting: Family Medicine

## 2020-06-09 ENCOUNTER — Ambulatory Visit
Admission: RE | Admit: 2020-06-09 | Discharge: 2020-06-09 | Disposition: A | Discharge status: Home / Self Care | Payer: Medicare PPO | Source: Ambulatory Visit | Attending: Vascular Surgery | Admitting: Vascular Surgery

## 2020-06-09 DIAGNOSIS — I714 Abdominal aortic aneurysm, without rupture: Secondary | ICD-10-CM | POA: Diagnosis not present

## 2020-06-09 DIAGNOSIS — N200 Calculus of kidney: Secondary | ICD-10-CM | POA: Diagnosis not present

## 2020-06-09 DIAGNOSIS — I7779 Dissection of other artery: Secondary | ICD-10-CM | POA: Diagnosis not present

## 2020-06-09 DIAGNOSIS — I713 Abdominal aortic aneurysm, ruptured, unspecified: Secondary | ICD-10-CM

## 2020-06-09 DIAGNOSIS — Z01818 Encounter for other preprocedural examination: Secondary | ICD-10-CM | POA: Diagnosis not present

## 2020-06-09 IMAGING — CT CT CTA ABD/PEL W/CM AND/OR W/O CM
2 of 8 series · 13 of 46 positions shown, 15 images · IV contrast (iopamidol)
Comparison: CT abdomen pelvis [DATE]

CT urogram [DATE]

CLINICAL DATA: Abdominal aortic aneurysm, preop planning

EXAM:
CTA ABDOMEN AND PELVIS WITHOUT AND WITH CONTRAST
TECHNIQUE: Multidetector CT imaging of the abdomen and pelvis was performed
using the standard protocol during bolus administration of
intravenous contrast. Multiplanar reconstructed images and MIPs were
obtained and reviewed to evaluate the vascular anatomy.
CONTRAST:  75 mL [OI] IOPAMIDOL ([OI]) INJECTION 76%

[Series 4: cta arterial 2.00 bv36 s3 axial st · axial · arterial · 0.67mm/px · z∈[+1237,+1581]mm · 10 of 202 slices shown, 12 images]
[im 20/202  soft-tissue]
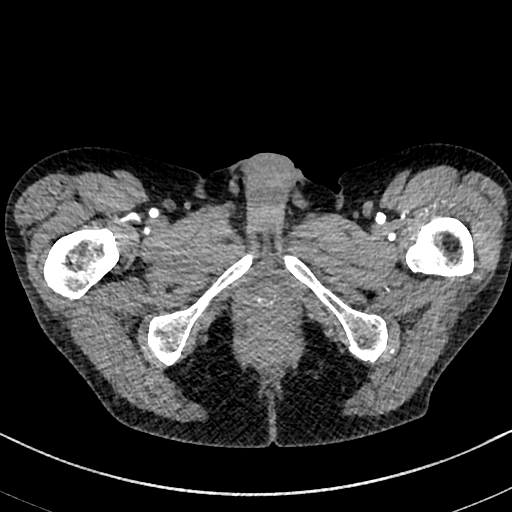
[im 20/202  bone]
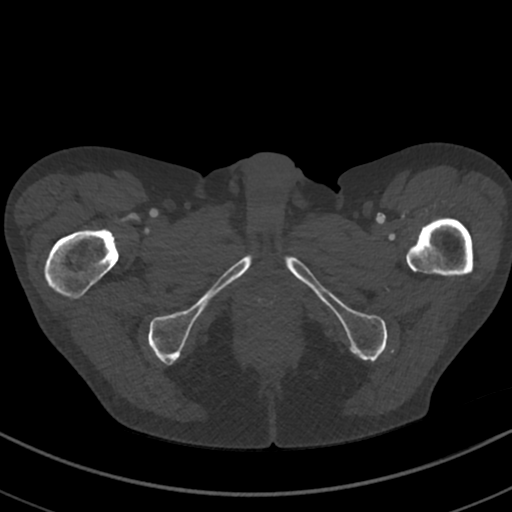
[im 39/202  soft-tissue]
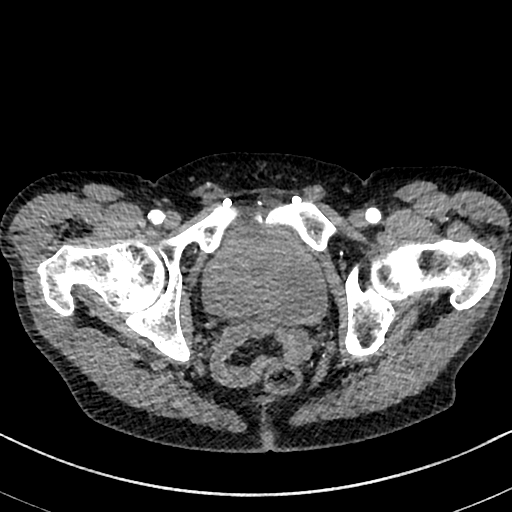
[im 58/202  soft-tissue]
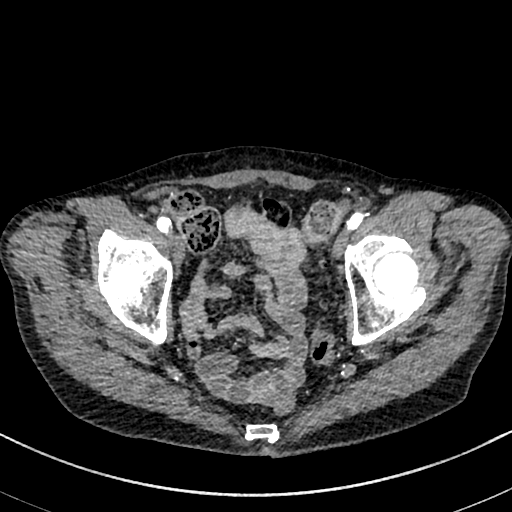
[im 77/202  soft-tissue]
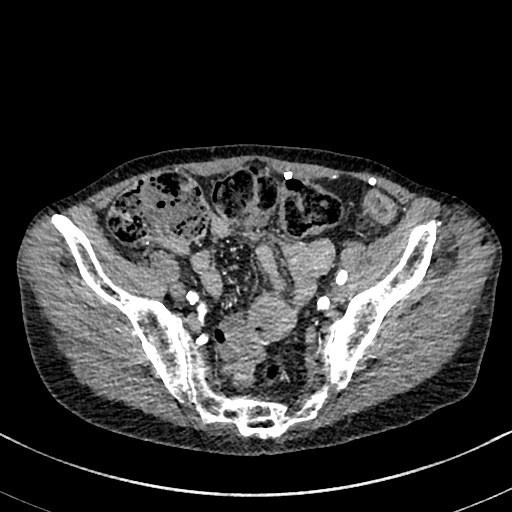
[im 96/202  soft-tissue]
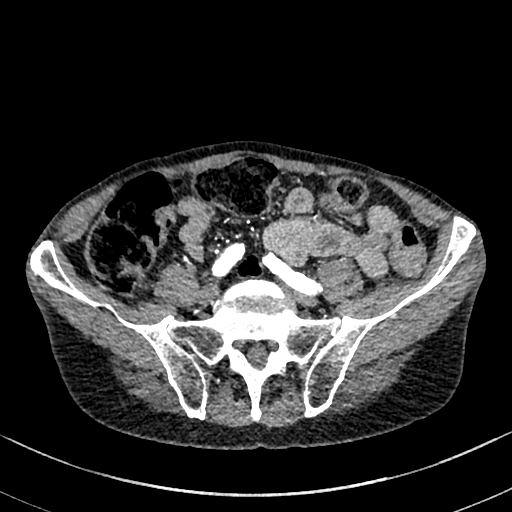
[im 115/202  soft-tissue]
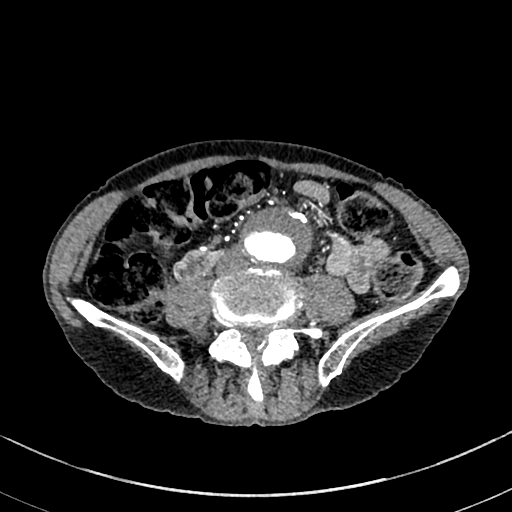
[im 135/202  soft-tissue]
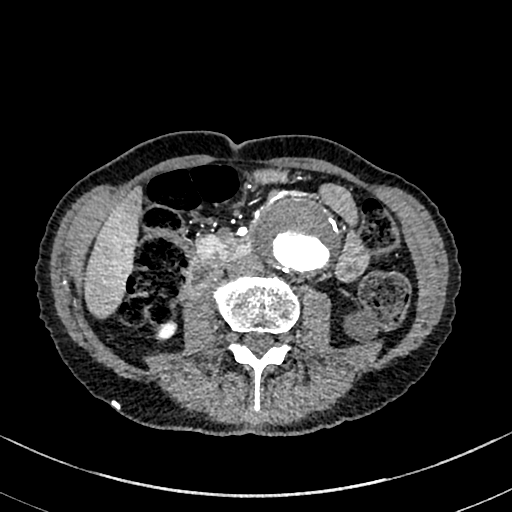
[im 154/202  soft-tissue]
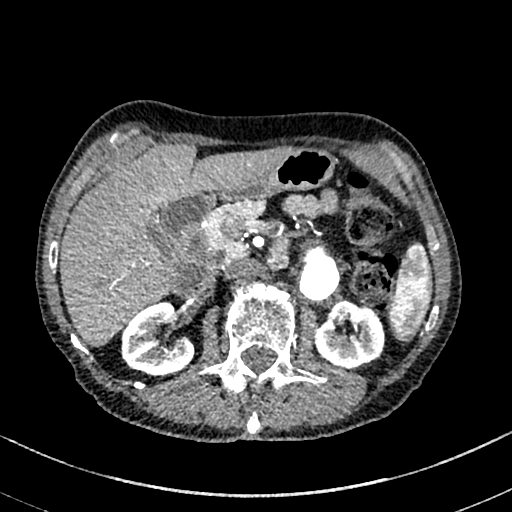
[im 173/202  soft-tissue]
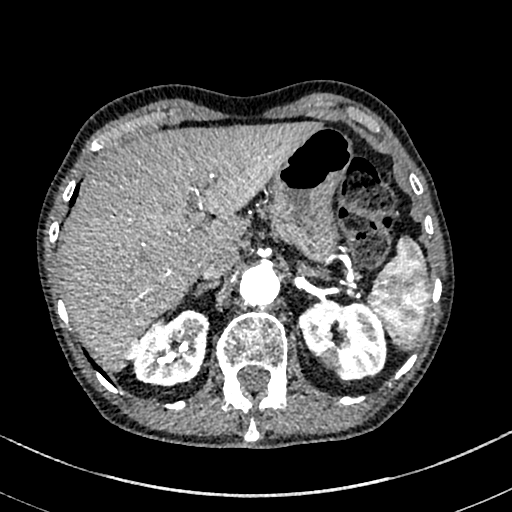
[im 173/202  bone]
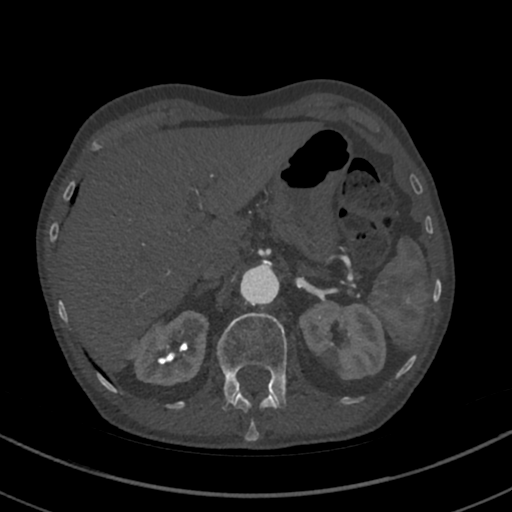
[im 192/202  soft-tissue]
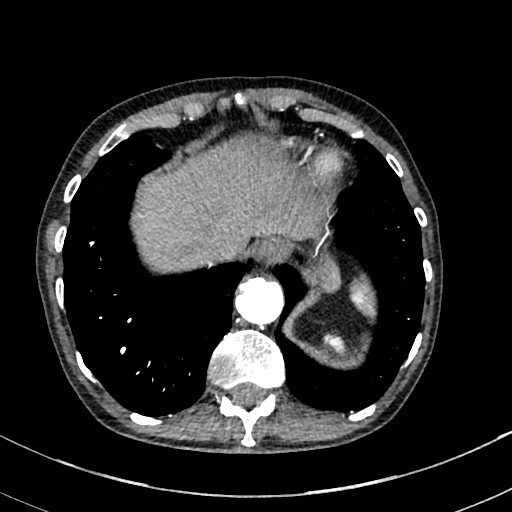

[Series 8: cta arterial 2.00 bv36 s3 cor art st · coronal · arterial · 0.67mm/px · 3 of 141 slices shown]
[im 36/141  soft-tissue]
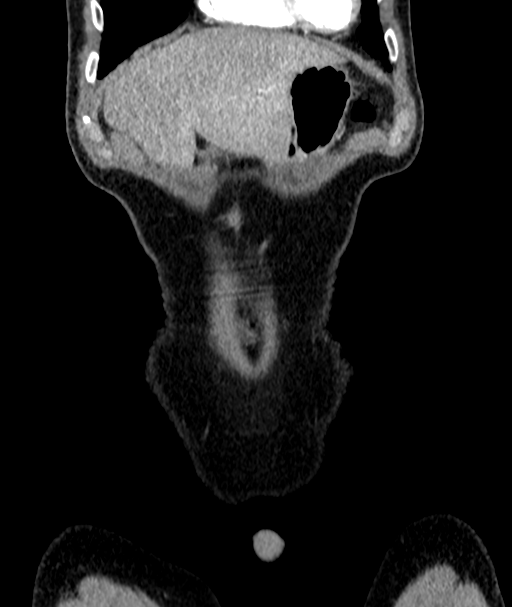
[im 71/141  soft-tissue]
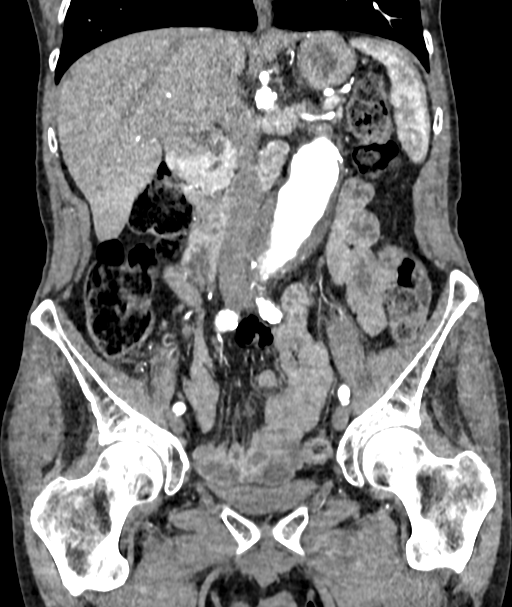
[im 106/141  soft-tissue]
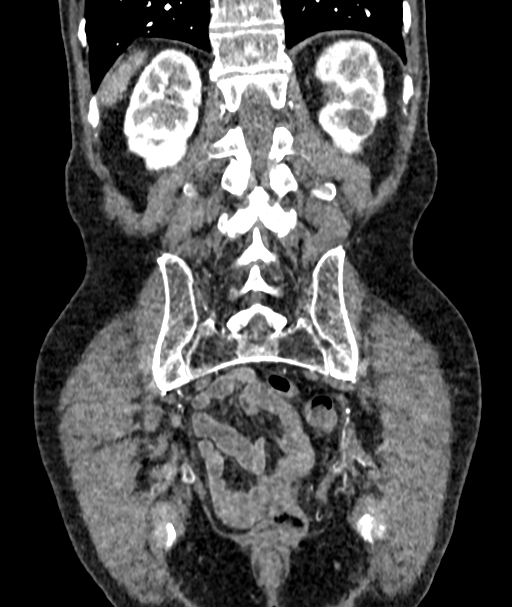

[13 of 46 positions shown; findings below may reference images not displayed]

FINDINGS: VASCULAR

Aorta: Infrarenal abdominal aortic aneurysm measures a maximum of
5.7 x 5.4 cm. When measured in a similar fashion on the prior
examination from [DATE], the infrarenal abdominal aortic
aneurysm measured 4.4 x 4.4 cm.

Celiac: Patent without evidence of aneurysm, dissection, vasculitis
or significant stenosis.

SMA: Mild stenosis at the origin, otherwise patent.

Renals: No significant stenosis of the renal arteries.

IMA: Occluded at the origin. Opacification of distal branches
indicative retrograde collateral flow.

Inflow: No significant narrowing of the right common or internal
iliac arteries. There is focal non flow limiting dissection of the
distal right common iliac artery best seen on image 79 of series 8
and 134 of series 4. Inflow vessels otherwise unremarkable.

Proximal Outflow: Bilateral common femoral and visualized portions
of the superficial and profunda femoral arteries are patent without
evidence of aneurysm, dissection, vasculitis or significant
stenosis.

Veins: No obvious venous abnormality within the limitations of this
arterial phase study.

Review of the MIP images confirms the above findings.

NON-VASCULAR

Lower chest: No acute abnormality.

Hepatobiliary: Subcentimeter hypodense lesion in the inferior tip of
the left hepatic lobe is unchanged dating back to [DATE] which
indicates a benign etiology, likely a cyst. Liver, gallbladder, and
bile ducts are otherwise unremarkable.

Pancreas: Unremarkable. No pancreatic ductal dilatation or
surrounding inflammatory changes.

Spleen: Normal in size without focal abnormality.

Adrenals/Urinary Tract: Adrenal glands are unremarkable. Numerous
bilateral nonobstructing renal calculi with the largest located at
the lower pole of the right kidney measuring 9 mm.

Solid enhancing exophytic mass in the lateral cortex of the mid
right kidney measuring 1.7 x 1.2 cm best seen on image 101 of series
8 is suspicious for malignancy.

There are 2 additional low-density lesions in the mid to lower left
kidney measuring 1.6 cm and 1.1 cm. These cannot be characterized as
a simple cyst given its internal density measuring between 25 and 35
Hounsfield units.

Focal thinning of the posteromedial cortex of the mid left kidney
likely due to prior ischemic or inflammatory insult. See kidneys in
ureters otherwise unremarkable.

Area of focal masslike thickening noted in the left posterolateral
bladder near the ureterovesicular junction which measures
approximately 1.3 x 0.7 cm. It is best seen on image 104 of series
10.

Stomach/Bowel: Masslike thickening of the gastroesophageal junction
best seen on image 10 of series 16 measures 2.8 x 1.9 cm. No bowel
dilatation to indicate ileus or obstruction. There is masslike
annular thickening of the mid transverse colon best seen on image
117 of series 4. Correlation with colonoscopy is recommended to
exclude underlying mass. The appendix is normal.

Lymphatic: No enlarged lymph nodes.

Reproductive: Moderately enlarged prostate.

Other: Hernia repair mesh noted in the left inguinal region.

Musculoskeletal: Mild compression deformity of the L4 vertebral body
superior endplate is new since [DATE]. Mild degenerative changes
seen throughout the lumbar spine.
IMPRESSION: VASCULAR

1. Infrarenal abdominal aortic aneurysm measuring up to 5.7 x 5.4 cm
compared to 4.4 x 4.4 cm on [DATE].
2. Focal non flow limiting dissection of the distal right external
iliac artery.

NON-VASCULAR

1. Solid mass in the upper pole of the right kidney measuring 1.7 x
1.2 cm (image 101/series 8) is suspicious for renal malignancy.
2. 1.6 and 1.1 cm low-density lesion in the lower half of the left
kidney is may represent mildly complex cysts given that there
internal densities measure between 25 and 35 Hounsfield units. They
cannot be characterized as simple cysts on the current examination.
There best visualized on the delayed phase images. Further
evaluation ultrasound should be considered to identified 3 simple
cyst in the lower half of the left kidney.
3. Multiple nonobstructing bilateral renal calculi, greater on the
right.
4. Masslike thickening of the wall of the gastroesophageal junction
(image 10/series 16) should be further evaluated with upper
endoscopy to exclude malignancy.
5. Annular thickening of the mid transverse colon wall best seen on
image 117 of series 4 should be further evaluated with colonoscopy
to exclude underlying mass.
6. Masslike thickening of the left posterolateral bladder wall
measuring 1.3 x 0.7 cm (image 104/series 10). Correlate with urine
cytology is this could represent a bladder mass. Correlation with
cystoscopy may be needed.

## 2020-06-09 MED ORDER — IOPAMIDOL (ISOVUE-370) INJECTION 76%
75.0000 mL | Freq: Once | INTRAVENOUS | Status: AC | PRN
  Administered 2020-06-09: 75 mL via INTRAVENOUS

## 2020-06-15 ENCOUNTER — Other Ambulatory Visit: Payer: Self-pay

## 2020-06-15 ENCOUNTER — Ambulatory Visit: Payer: Medicare PPO | Admitting: Vascular Surgery

## 2020-06-15 VITALS — BP 121/76 | HR 67 | Temp 97.7°F | Resp 16 | Ht 69.0 in | Wt 127.0 lb

## 2020-06-15 DIAGNOSIS — I714 Abdominal aortic aneurysm, without rupture, unspecified: Secondary | ICD-10-CM

## 2020-06-15 NOTE — Progress Notes (Signed)
Patient name: Connor Cuevas MRN: [id number] DOB: Jun 14, 1940 Sex: male  REASON FOR CONSULT: F/U to discuss enlarging abdominal aortic aneurysm after CTA  HPI: Connor Cuevas is a 80 y.o. male, with history of coronary artery disease status post PCI, HLD, and COPD that presents to discuss CTA results for enlarging AAA.  Patient has a known AAA since 2014.  I last saw him in 2020 and it measured 4.4 cm.  He has had several duplexes since I last saw him and it measured 4.7 cm in January 2021 and most recently enlarged to 5.3 cm in January 2022 at his cardiologist office.  He was sent back to see me for enlarging AAA on duplex.  CTA was further obtained 06/10/2020 and this showed enlarging infrarenal aneurysm measuring up to 5.7 cm per the radiology read.  Patient denies any history of abdominal surgery other than a right inguinal hernia repair.  He is on Plavix for his coronary stent.  No new abdominal pain at this time.  He wants to move forward with getting his aneurysm repaired.  Past Medical History:  Diagnosis Date  . AAA (abdominal aortic aneurysm) ([place]) 04/16/2018   4.3 cm, noted on [place]   . Acute kidney failure ([place]) 2005 approx   after ureteroscopy, hospitalized for about 10 days afterwards  . Anxiety   . Arthritis    Hands, wrist, elbow, back and spine  . Bulging lumbar disc    L4-L5 degeneration, Chronic back pain  . CAD (coronary artery disease)    a. 01/09/14: [place] s/p DES to RCA   . COPD (chronic obstructive pulmonary disease) ([facility])    wheezing  . Dyspnea    with exertion  . Glaucoma of both eyes   . Hepatic cyst 2013   Several tiny hepatic cysts , noted on CT  . History of bilateral inguinal hernias   . History of kidney stones    small kidney stones in right kidney  . Hyperlipidemia   . Interstitial lung disease ([place])   . Leg pain    ABIs (10/15):  R 1.3, L 1.2, normal  . Orthopnea    uses 2 pillows  . Pneumonia   . Pulmonary nodule, right 08/02/2016   a. Right  upper lobe  . Right ureteral stone   . Seasonal allergies   . 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 Aortosclerosis, bilateral - mild with calcification. Per Dr. Moreen Fowler at [facility]   . Tobacco abuse   . Wears dentures    upper   . Wears glasses     Past Surgical History:  Procedure Laterality Date  . CARDIAC CATHETERIZATION  2017   right carotid stent  . CATARACT EXTRACTION W/ INTRAOCULAR LENS  IMPLANT, BILATERAL  1997 (APPROX)  . CATARACT EXTRACTION W/PHACO Left 03/31/2020   Procedure: CATARACT EXTRACTION PHACO AND INTRAOCULAR LENS PLACEMENT (IOC) LEFT 5.51 01:16.1 7.2%;  Surgeon: Leandrew Koyanagi, MD;  Location: [facility];  Service: Ophthalmology;  Laterality: Left;  . CYSTOSCOPY/RETROGRADE/URETEROSCOPY/STONE EXTRACTION WITH BASKET  10/17/2011   Procedure: CYSTOSCOPY/RETROGRADE/URETEROSCOPY/STONE EXTRACTION WITH BASKET;  Surgeon: Hanley Ben, MD;  Location: [place];  Service: Urology;  Laterality: Right;  . CYSTOSCOPY/URETEROSCOPY/HOLMIUM LASER/STENT PLACEMENT Right 05/13/2018   Procedure: CYSTOSCOPY/RETROGRADE/URETEROSCOPY/HOLMIUM LASER;  Surgeon: Kathie Rhodes, MD;  Location: [facility];  Service: Urology;  Laterality: Right;  . HERNIA REPAIR    . LAPAROSCOPIC LEFT  INGUINAL (OBTURATOR) HERNIA AND UMBILICAL HERNIA REPAIR  01-13-2011  . LEFT HEART CATHETERIZATION WITH CORONARY ANGIOGRAM N/A 01/09/2014   Procedure: LEFT HEART CATHETERIZATION WITH CORONARY ANGIOGRAM;  Surgeon: Josue Hector, MD;  Location: [facility] CATH LAB;  Service: Cardiovascular;  Laterality: N/A;  . LEFT URETEROSCOPIC STONE EXTRACTION  11-30-2000   W/ PREVIOUS ESWL AND URETERAL STENT PLACEMENT  . PHOTOCOAGULATION Right 12/10/2019   Procedure: TRANSCLEARAL DIODE CYCLOPHOTOCOAGULATION RIGHT;  Surgeon: Leandrew Koyanagi, MD;  Location: [facility];  Service: Ophthalmology;  Laterality: Right;  . REPAIR  RIGHT INGUINAL HERNIA W/ MESH  12-31-2000  . TONSILLECTOMY    . URETEROLITHOTOMY  2009    Family History  Problem Relation Age of Onset  . Hypertension Mother   . Heart attack Other        uncle  . Stroke Other   . Stroke Maternal Grandmother   . Stroke Maternal Grandfather   . Stroke Paternal Grandmother   . Stroke Paternal Grandfather     SOCIAL HISTORY: Social History   Socioeconomic History  . Marital status: Married    Spouse name: Not on file  . Number of children: Not on file  . Years of education: Not on file  . Highest education level: Not on file  Occupational History  . Not on file  Tobacco Use  . Smoking status: Former Smoker    Packs/day: 1.00    Years: 53.00    Pack years: 53.00    Types: Cigarettes    Quit date: 03/11/2018    Years since quitting: 2.2  . Smokeless tobacco: Never Used  Vaping Use  . Vaping Use: Never used  Substance and Sexual Activity  . Alcohol use: No  . Drug use: No  . Sexual activity: Yes  Other Topics Concern  . Not on file  Social History Narrative  . Not on file   Social Determinants of Health   Financial Resource Strain: Not on file  Food Insecurity: Not on file  Transportation Needs: Not on file  Physical Activity: Not on file  Stress: Not on file  Social Connections: Not on file  Intimate Partner Violence: Not on file    Allergies  Allergen Reactions  . Cefaclor Anaphylaxis, Rash and Swelling    Throat closed  . Nitrofurantoin Anaphylaxis    Right kidney shut down   . Chantix [Varenicline] Other (See Comments)    nightmares  . Nsaids Other (See Comments)  . Other Other (See Comments)    Current Outpatient Medications  Medication Sig Dispense Refill  . atenolol (TENORMIN) 25 MG tablet Take 1 tablet (25 mg total) by mouth daily. 90 tablet 3  . atorvastatin (LIPITOR) 80 MG tablet Take 1 tablet (80 mg total) by mouth every morning. 90 tablet 3  . brimonidine (ALPHAGAN P) 0.1 % SOLN Place 1 drop into the  right eye 2 (two) times daily.    . clopidogrel (PLAVIX) 75 MG tablet Take 1 tablet by mouth once daily 90 tablet 0  . Coenzyme Q10 (CO Q 10) 100 MG CAPS Take 100 mg by mouth daily.    . dorzolamide-timolol (COSOPT) 22.3-6.8 MG/ML ophthalmic solution Place 1 drop into both eyes 2 (two) times daily.    Marland Kitchen gabapentin (NEURONTIN) 100 MG capsule Take 1 capsule (100 mg total) by mouth 3 (three) times daily. (Patient taking differently: Take 100 mg by mouth 2 (two) times daily.) 90 capsule 0  . HYDROcodone-acetaminophen (NORCO) 5-325 MG per tablet Take 0.5 tablets by mouth every  6 (six) hours as needed for moderate pain. For pain    . latanoprost (XALATAN) 0.005 % ophthalmic solution 1 drop at bedtime.    . methocarbamol (ROBAXIN) 500 MG tablet TAKE 1 TABLET BY MOUTH EVERY 8 HOURS AS NEEDED 30 tablet 0  . Multiple Vitamins-Minerals (ICAPS AREDS FORMULA PO) Take 2 tablets by mouth daily.     . nitroGLYCERIN (NITROSTAT) 0.4 MG SL tablet Place 1 tablet (0.4 mg total) under the tongue every 5 (five) minutes as needed for chest pain. 25 tablet 12  . OMEGA-3 KRILL OIL PO Take 200 mg by mouth daily.    Marland Kitchen PROAIR HFA 108 (90 Base) MCG/ACT inhaler INHALE 2 PUFFS BY MOUTH EVERY 4 HOURS AS NEEDED FOR WHEEZING    . pseudoephedrine (SUDAFED) 120 MG 12 hr tablet Take 120 mg by mouth 2 (two) times daily.    Marland Kitchen tiotropium (SPIRIVA) 18 MCG inhalation capsule Place 18 mcg into inhaler and inhale every morning.    . traZODone (DESYREL) 100 MG tablet Take 100 mg by mouth at bedtime.      No current facility-administered medications for this visit.    REVIEW OF SYSTEMS:  [X]  denotes positive finding, [ ]  denotes negative finding Cardiac  Comments:  Chest pain or chest pressure:    Shortness of breath upon exertion:    Short of breath when lying flat:    Irregular heart rhythm:        Vascular    Pain in calf, thigh, or hip brought on by ambulation:    Pain in feet at night that wakes you up from your sleep:      Blood clot in your veins:    Leg swelling:         Pulmonary    Oxygen at home:    Productive cough:     Wheezing:         Neurologic    Sudden weakness in arms or legs:     Sudden numbness in arms or legs:     Sudden onset of difficulty speaking or slurred speech:    Temporary loss of vision in one eye:     Problems with dizziness:         Gastrointestinal    Blood in stool:     Vomited blood:         Genitourinary    Burning when urinating:     Blood in urine:        Psychiatric    Major depression:         Hematologic    Bleeding problems:    Problems with blood clotting too easily:        Skin    Rashes or ulcers:        Constitutional    Fever or chills:      PHYSICAL EXAM: Vitals:   06/15/20 0926  BP: 121/76  Pulse: 67  Resp: 16  Temp: 97.7 F (36.5 C)  TempSrc: Temporal  SpO2: 93%  Weight: 127 lb (57.6 kg)  Height: 5\' 9"  (1.753 m)    GENERAL: The patient is a well-nourished male, in no acute distress. The vital signs are documented above. CARDIAC: There is a regular rate and rhythm.  VASCULAR:  2+ femoral pulse palpable bilateral groins 2+ palpable DP pulses bilatearlly PULMONARY: There is good air exchange bilaterally without wheezing or rales. ABDOMEN: Soft and non-tender.  No pain with palpation of aneurysm. MUSCULOSKELETAL: There are no major deformities or cyanosis. NEUROLOGIC:  No focal weakness or paresthesias are detected.   DATA:   CTA 06/10/2020 on my review shows a 5.9 cm infrarenal abdominal aortic aneurysm based on sagittal measurements with a fairly angulated neck and a segment of right external iliac artery stenosis.  Assessment/Plan:  80 year old male presents for evaluation of enlarging abdominal aortic aneurysm.  Ultimately a CTA was obtained given the patient's concern for the growth in the aneurysm and this shows a 5.9 cm infrarenal abdominal aortic aneurysm based on sagittal measurements.  I have recommended stent graft  repair as I discussed with him and his wife today.  We discussed this being done under general anesthesia with bilateral common femoral percutaneous access.  I did discuss that he has a fairly angulated neck but given a long neck with the new conformable devices this should be amenable to stent graft repair with good seal.  Risks and benefits were discussed including vessel injury, groin bleed, endoleak, risk of anesthesia, heart attack, stroke etc.  We will get him scheduled for Monday.  He can continue plavix from my standpoint.  I did discuss incidental findings on the CT including thickening of the GE junction and transverse colon and I referred him back to his GI doctor Dr. Michail Sermon.  I also discussed the concern for right renal mass and referred him back to his urologist Dr. Roswell Miners with [facility] for further evaluation.    Marty Heck, MD [facility] [place] Office: [place]

## 2020-06-18 ENCOUNTER — Encounter (HOSPITAL_COMMUNITY): Payer: Self-pay | Admitting: Vascular Surgery

## 2020-06-18 ENCOUNTER — Other Ambulatory Visit: Payer: Self-pay

## 2020-06-18 ENCOUNTER — Other Ambulatory Visit (HOSPITAL_COMMUNITY): Payer: Medicare PPO

## 2020-06-18 NOTE — Progress Notes (Signed)
[name] denies chest pain or shortness of breath. Mr. Connor Cuevas denies anmy s/s of Covid for himself or his household. Patient has not been in contact with anyone with Covid to his knowledge.  Mr. Connor Cuevas will be tested Sat am and quarantine with his wifetuntil Monday am.

## 2020-06-19 ENCOUNTER — Other Ambulatory Visit (HOSPITAL_COMMUNITY)
Admission: RE | Admit: 2020-06-19 | Discharge: 2020-06-19 | Disposition: A | Discharge status: Home / Self Care | Payer: Medicare PPO | Source: Ambulatory Visit | Attending: Vascular Surgery | Admitting: Vascular Surgery

## 2020-06-19 DIAGNOSIS — M549 Dorsalgia, unspecified: Secondary | ICD-10-CM | POA: Diagnosis present

## 2020-06-19 DIAGNOSIS — Z7902 Long term (current) use of antithrombotics/antiplatelets: Secondary | ICD-10-CM | POA: Diagnosis not present

## 2020-06-19 DIAGNOSIS — Z01812 Encounter for preprocedural laboratory examination: Secondary | ICD-10-CM | POA: Insufficient documentation

## 2020-06-19 DIAGNOSIS — Z955 Presence of coronary angioplasty implant and graft: Secondary | ICD-10-CM | POA: Diagnosis not present

## 2020-06-19 DIAGNOSIS — R0601 Orthopnea: Secondary | ICD-10-CM | POA: Diagnosis present

## 2020-06-19 DIAGNOSIS — Z85828 Personal history of other malignant neoplasm of skin: Secondary | ICD-10-CM | POA: Diagnosis not present

## 2020-06-19 DIAGNOSIS — Z823 Family history of stroke: Secondary | ICD-10-CM | POA: Diagnosis not present

## 2020-06-19 DIAGNOSIS — Z886 Allergy status to analgesic agent status: Secondary | ICD-10-CM | POA: Diagnosis not present

## 2020-06-19 DIAGNOSIS — J449 Chronic obstructive pulmonary disease, unspecified: Secondary | ICD-10-CM | POA: Diagnosis not present

## 2020-06-19 DIAGNOSIS — I708 Atherosclerosis of other arteries: Secondary | ICD-10-CM | POA: Diagnosis present

## 2020-06-19 DIAGNOSIS — E785 Hyperlipidemia, unspecified: Secondary | ICD-10-CM | POA: Diagnosis not present

## 2020-06-19 DIAGNOSIS — I739 Peripheral vascular disease, unspecified: Secondary | ICD-10-CM | POA: Diagnosis present

## 2020-06-19 DIAGNOSIS — G8929 Other chronic pain: Secondary | ICD-10-CM | POA: Diagnosis not present

## 2020-06-19 DIAGNOSIS — Z888 Allergy status to other drugs, medicaments and biological substances status: Secondary | ICD-10-CM | POA: Diagnosis not present

## 2020-06-19 DIAGNOSIS — I714 Abdominal aortic aneurysm, without rupture: Secondary | ICD-10-CM | POA: Diagnosis not present

## 2020-06-19 DIAGNOSIS — Z87891 Personal history of nicotine dependence: Secondary | ICD-10-CM | POA: Diagnosis not present

## 2020-06-19 DIAGNOSIS — F419 Anxiety disorder, unspecified: Secondary | ICD-10-CM | POA: Diagnosis not present

## 2020-06-19 DIAGNOSIS — Z8249 Family history of ischemic heart disease and other diseases of the circulatory system: Secondary | ICD-10-CM | POA: Diagnosis not present

## 2020-06-19 DIAGNOSIS — Z20822 Contact with and (suspected) exposure to covid-19: Secondary | ICD-10-CM | POA: Diagnosis not present

## 2020-06-19 DIAGNOSIS — Z79899 Other long term (current) drug therapy: Secondary | ICD-10-CM | POA: Diagnosis not present

## 2020-06-19 DIAGNOSIS — M1990 Unspecified osteoarthritis, unspecified site: Secondary | ICD-10-CM | POA: Diagnosis not present

## 2020-06-19 DIAGNOSIS — I2510 Atherosclerotic heart disease of native coronary artery without angina pectoris: Secondary | ICD-10-CM | POA: Diagnosis not present

## 2020-06-19 DIAGNOSIS — H409 Unspecified glaucoma: Secondary | ICD-10-CM | POA: Diagnosis present

## 2020-06-19 LAB — SARS CORONAVIRUS 2 (TAT 6-24 HRS): SARS Coronavirus 2: NEGATIVE

## 2020-06-21 ENCOUNTER — Inpatient Hospital Stay (HOSPITAL_COMMUNITY): Payer: Medicare PPO | Admitting: Certified Registered Nurse Anesthetist

## 2020-06-21 ENCOUNTER — Inpatient Hospital Stay (HOSPITAL_COMMUNITY)
Admission: RE | Admit: 2020-06-21 | Discharge: 2020-06-22 | DRG: 269 | Disposition: A | Discharge status: Home / Self Care | Payer: Medicare PPO | Attending: Vascular Surgery | Admitting: Vascular Surgery

## 2020-06-21 ENCOUNTER — Other Ambulatory Visit: Payer: Self-pay

## 2020-06-21 ENCOUNTER — Encounter (HOSPITAL_COMMUNITY): Payer: Self-pay | Admitting: Vascular Surgery

## 2020-06-21 ENCOUNTER — Inpatient Hospital Stay (HOSPITAL_COMMUNITY): Payer: Medicare PPO

## 2020-06-21 ENCOUNTER — Encounter (HOSPITAL_COMMUNITY)
Admission: RE | Disposition: A | Discharge status: Home / Self Care | Payer: Self-pay | Source: Home / Self Care | Attending: Vascular Surgery

## 2020-06-21 DIAGNOSIS — Z888 Allergy status to other drugs, medicaments and biological substances status: Secondary | ICD-10-CM | POA: Diagnosis not present

## 2020-06-21 DIAGNOSIS — Z823 Family history of stroke: Secondary | ICD-10-CM

## 2020-06-21 DIAGNOSIS — M549 Dorsalgia, unspecified: Secondary | ICD-10-CM | POA: Diagnosis present

## 2020-06-21 DIAGNOSIS — Z87891 Personal history of nicotine dependence: Secondary | ICD-10-CM | POA: Diagnosis not present

## 2020-06-21 DIAGNOSIS — Z79899 Other long term (current) drug therapy: Secondary | ICD-10-CM

## 2020-06-21 DIAGNOSIS — Z8679 Personal history of other diseases of the circulatory system: Secondary | ICD-10-CM

## 2020-06-21 DIAGNOSIS — H409 Unspecified glaucoma: Secondary | ICD-10-CM | POA: Diagnosis present

## 2020-06-21 DIAGNOSIS — M1990 Unspecified osteoarthritis, unspecified site: Secondary | ICD-10-CM | POA: Diagnosis present

## 2020-06-21 DIAGNOSIS — E785 Hyperlipidemia, unspecified: Secondary | ICD-10-CM | POA: Diagnosis present

## 2020-06-21 DIAGNOSIS — Z886 Allergy status to analgesic agent status: Secondary | ICD-10-CM

## 2020-06-21 DIAGNOSIS — Z955 Presence of coronary angioplasty implant and graft: Secondary | ICD-10-CM | POA: Diagnosis not present

## 2020-06-21 DIAGNOSIS — Z7902 Long term (current) use of antithrombotics/antiplatelets: Secondary | ICD-10-CM | POA: Diagnosis not present

## 2020-06-21 DIAGNOSIS — I2510 Atherosclerotic heart disease of native coronary artery without angina pectoris: Secondary | ICD-10-CM | POA: Diagnosis present

## 2020-06-21 DIAGNOSIS — Z20822 Contact with and (suspected) exposure to covid-19: Secondary | ICD-10-CM | POA: Diagnosis present

## 2020-06-21 DIAGNOSIS — I708 Atherosclerosis of other arteries: Secondary | ICD-10-CM | POA: Diagnosis present

## 2020-06-21 DIAGNOSIS — F419 Anxiety disorder, unspecified: Secondary | ICD-10-CM | POA: Diagnosis present

## 2020-06-21 DIAGNOSIS — I739 Peripheral vascular disease, unspecified: Secondary | ICD-10-CM | POA: Diagnosis present

## 2020-06-21 DIAGNOSIS — J449 Chronic obstructive pulmonary disease, unspecified: Secondary | ICD-10-CM | POA: Diagnosis present

## 2020-06-21 DIAGNOSIS — R0601 Orthopnea: Secondary | ICD-10-CM | POA: Diagnosis present

## 2020-06-21 DIAGNOSIS — Z85828 Personal history of other malignant neoplasm of skin: Secondary | ICD-10-CM | POA: Diagnosis not present

## 2020-06-21 DIAGNOSIS — Z8249 Family history of ischemic heart disease and other diseases of the circulatory system: Secondary | ICD-10-CM

## 2020-06-21 DIAGNOSIS — I714 Abdominal aortic aneurysm, without rupture, unspecified: Secondary | ICD-10-CM | POA: Diagnosis present

## 2020-06-21 DIAGNOSIS — G8929 Other chronic pain: Secondary | ICD-10-CM | POA: Diagnosis present

## 2020-06-21 HISTORY — PX: ULTRASOUND GUIDANCE FOR VASCULAR ACCESS: SHX6516

## 2020-06-21 HISTORY — PX: ABDOMINAL AORTIC ENDOVASCULAR STENT GRAFT: SHX5707

## 2020-06-21 LAB — BASIC METABOLIC PANEL
Anion gap: 5 (ref 5–15)
BUN: 12 mg/dL (ref 8–23)
CO2: 25 mmol/L (ref 22–32)
Calcium: 8.5 mg/dL — ABNORMAL LOW (ref 8.9–10.3)
Chloride: 107 mmol/L (ref 98–111)
Creatinine, Ser: 0.83 mg/dL (ref 0.61–1.24)
GFR, Estimated: >60 mL/min (ref >60)
Glucose, Bld: 109 mg/dL — ABNORMAL HIGH (ref 70–99)
Potassium: 4.4 mmol/L (ref 3.5–5.1)
Sodium: 137 mmol/L (ref 135–145)

## 2020-06-21 LAB — CBC
HCT: 45.6 % (ref 39.0–52.0)
HCT: 40.3 % (ref 39.0–52.0)
Hemoglobin: 14.5 g/dL (ref 13.0–17.0)
Hemoglobin: 13.4 g/dL (ref 13.0–17.0)
MCH: 29.2 pg (ref 26.0–34.0)
MCH: 30.1 pg (ref 26.0–34.0)
MCHC: 31.8 g/dL (ref 30.0–36.0)
MCHC: 33.3 g/dL (ref 30.0–36.0)
MCV: 91.9 fL (ref 80.0–100.0)
MCV: 90.6 fL (ref 80.0–100.0)
Platelets: 241 10*3/uL (ref 150–400)
Platelets: 218 10*3/uL (ref 150–400)
RBC: 4.96 MIL/uL (ref 4.22–5.81)
RBC: 4.45 MIL/uL (ref 4.22–5.81)
RDW: 13.4 % (ref 11.5–15.5)
RDW: 13.4 % (ref 11.5–15.5)
WBC: 7.4 10*3/uL (ref 4.0–10.5)
WBC: 10 10*3/uL (ref 4.0–10.5)
nRBC: 0 % (ref 0.0–0.2)
nRBC: 0 % (ref 0.0–0.2)

## 2020-06-21 LAB — COMPREHENSIVE METABOLIC PANEL
ALT: 14 U/L (ref 0–44)
AST: 19 U/L (ref 15–41)
Albumin: 3.7 g/dL (ref 3.5–5.0)
Alkaline Phosphatase: 49 U/L (ref 38–126)
Anion gap: 8 (ref 5–15)
BUN: 13 mg/dL (ref 8–23)
CO2: 24 mmol/L (ref 22–32)
Calcium: 9.1 mg/dL (ref 8.9–10.3)
Chloride: 107 mmol/L (ref 98–111)
Creatinine, Ser: 0.93 mg/dL (ref 0.61–1.24)
GFR, Estimated: >60 mL/min (ref >60)
Glucose, Bld: 97 mg/dL (ref 70–99)
Potassium: 4.1 mmol/L (ref 3.5–5.1)
Sodium: 139 mmol/L (ref 135–145)
Total Bilirubin: 1.2 mg/dL (ref 0.3–1.2)
Total Protein: 6.5 g/dL (ref 6.5–8.1)

## 2020-06-21 LAB — TYPE AND SCREEN
ABO/RH(D): A POS
Antibody Screen: NEGATIVE

## 2020-06-21 LAB — PROTIME-INR
INR: 1 (ref 0.8–1.2)
INR: 1.1 (ref 0.8–1.2)
Prothrombin Time: 13 seconds (ref 11.4–15.2)
Prothrombin Time: 14.1 seconds (ref 11.4–15.2)

## 2020-06-21 LAB — BLOOD GAS, ARTERIAL
Acid-base deficit: 0.6 mmol/L (ref 0.0–2.0)
Bicarbonate: 23.7 mmol/L (ref 20.0–28.0)
Drawn by: 45071
FIO2: 21
O2 Saturation: 98.4 %
Patient temperature: 37
pCO2 arterial: 39.4 mmHg (ref 32.0–48.0)
pH, Arterial: 7.396 (ref 7.350–7.450)
pO2, Arterial: 114 mmHg — ABNORMAL HIGH (ref 83.0–108.0)

## 2020-06-21 LAB — SURGICAL PCR SCREEN
MRSA, PCR: NEGATIVE
Staphylococcus aureus: NEGATIVE

## 2020-06-21 LAB — APTT
aPTT: 27 seconds (ref 24–36)
aPTT: 31 seconds (ref 24–36)

## 2020-06-21 LAB — ABO/RH: ABO/RH(D): A POS

## 2020-06-21 LAB — MAGNESIUM: Magnesium: 1.9 mg/dL (ref 1.7–2.4)

## 2020-06-21 IMAGING — DX DG CHEST 1V PORT
1 series · 1 of 1 positions shown · non-contrast
Comparison: [DATE].

CLINICAL DATA: Status post abdominal aortic aneurysm repair.

EXAM:
PORTABLE CHEST 1 VIEW

[chest]
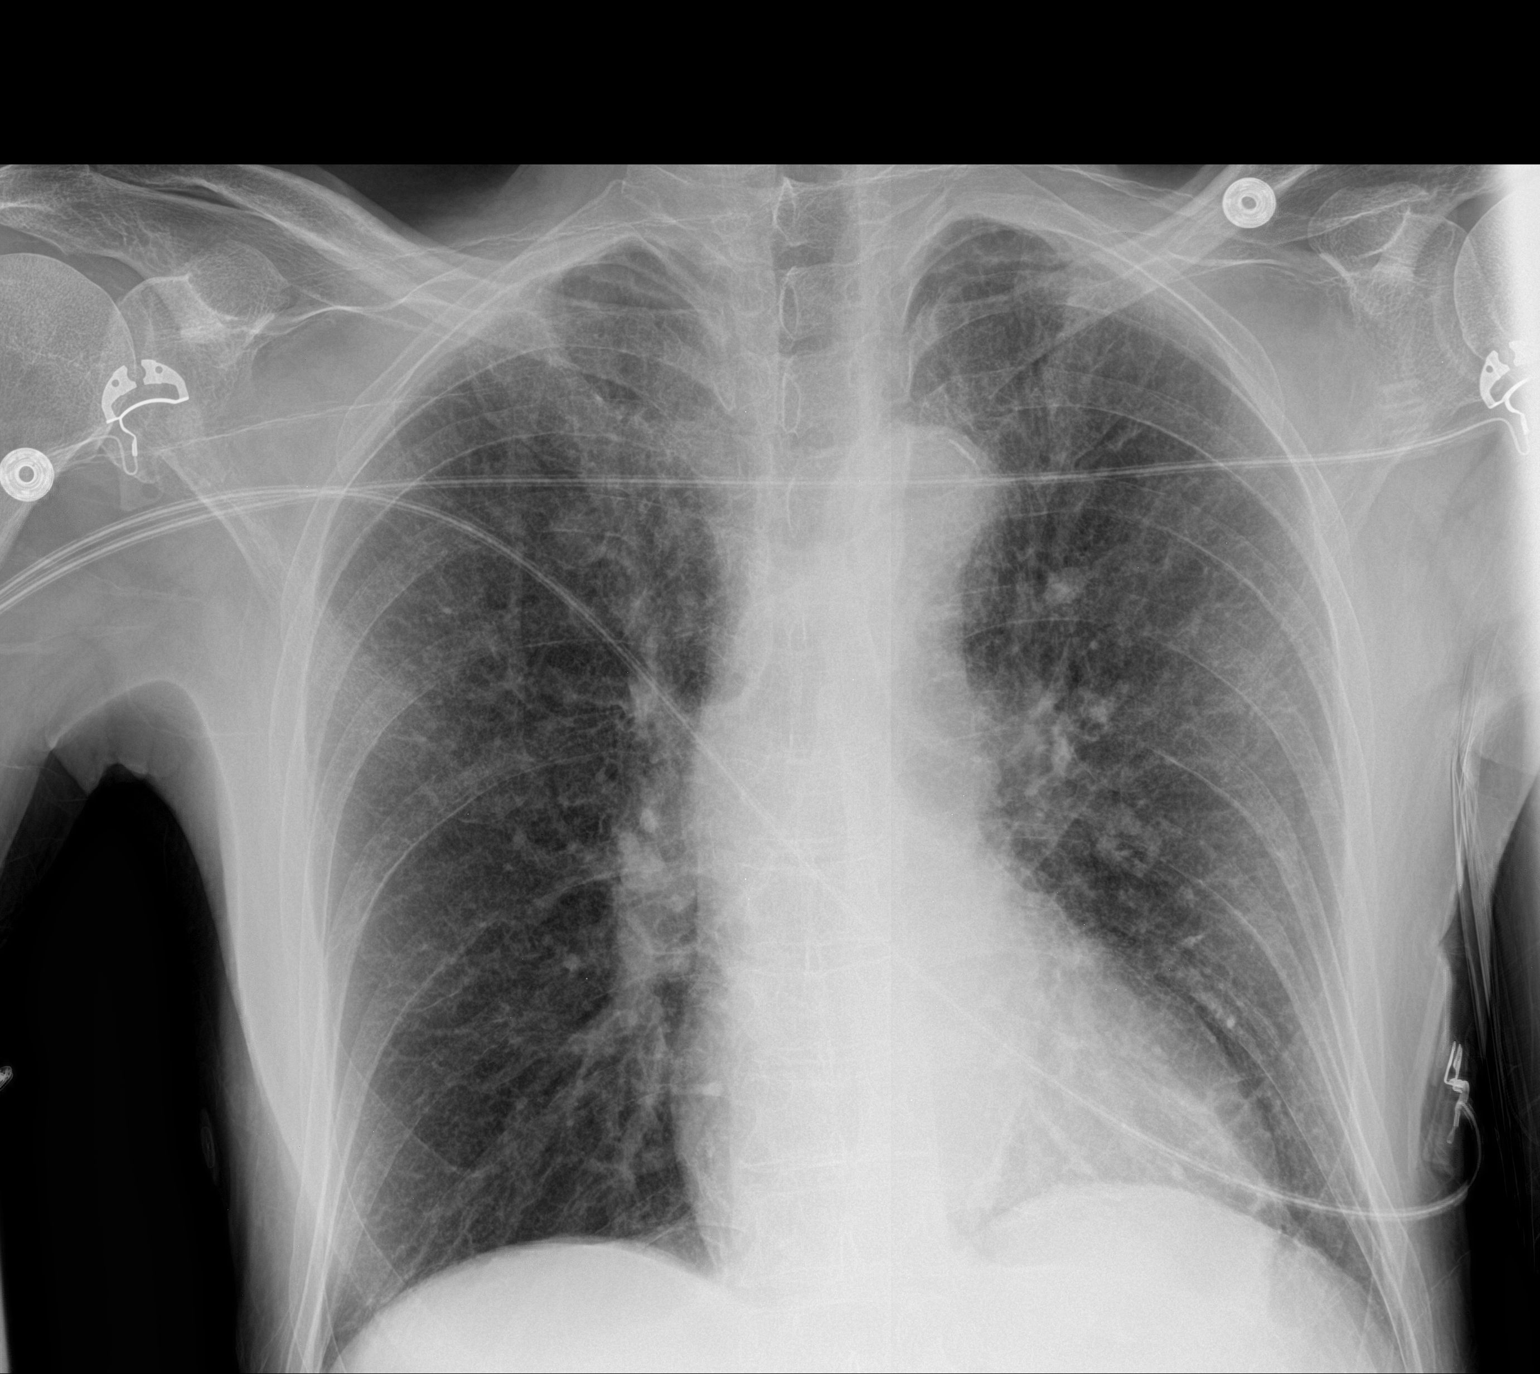

[1 of 1 positions shown; findings below may reference images not displayed]

FINDINGS: The heart size and mediastinal contours are within normal limits.
Both lungs are clear. The visualized skeletal structures are
unremarkable.
IMPRESSION: No active disease.

## 2020-06-21 SURGERY — INSERTION, ENDOVASCULAR STENT GRAFT, AORTA, ABDOMINAL
Anesthesia: General | Site: Groin | Laterality: Bilateral

## 2020-06-21 MED ORDER — CHLORHEXIDINE GLUCONATE 0.12 % MT SOLN
OROMUCOSAL | Status: AC
  Administered 2020-06-21: 15 mL via OROMUCOSAL
  Filled 2020-06-21: qty 15

## 2020-06-21 MED ORDER — LIDOCAINE 2% (20 MG/ML) 5 ML SYRINGE
INTRAMUSCULAR | Status: AC
  Filled 2020-06-21: qty 5

## 2020-06-21 MED ORDER — MORPHINE SULFATE (PF) 2 MG/ML IV SOLN
2.0000 mg | INTRAVENOUS | Status: DC | PRN
  Administered 2020-06-21 – 2020-06-22 (×3): 2 mg via INTRAVENOUS
  Filled 2020-06-21 (×3): qty 1

## 2020-06-21 MED ORDER — CHLORHEXIDINE GLUCONATE CLOTH 2 % EX PADS: 6.0000 | MEDICATED_PAD | Freq: Once | CUTANEOUS | Status: DC

## 2020-06-21 MED ORDER — IODIXANOL 320 MG/ML IV SOLN
INTRAVENOUS | Status: DC | PRN
  Administered 2020-06-21: 94.7 mL

## 2020-06-21 MED ORDER — LACTATED RINGERS IV SOLN: INTRAVENOUS | Status: DC

## 2020-06-21 MED ORDER — SUGAMMADEX SODIUM 200 MG/2ML IV SOLN
INTRAVENOUS | Status: DC | PRN
  Administered 2020-06-21: 200 mg via INTRAVENOUS

## 2020-06-21 MED ORDER — SODIUM CHLORIDE 0.9 % IV SOLN
INTRAVENOUS | Status: DC | PRN
  Administered 2020-06-21: 500 mL

## 2020-06-21 MED ORDER — HEPARIN SODIUM (PORCINE) 5000 UNIT/ML IJ SOLN: 5000.0000 [IU] | Freq: Three times a day (TID) | INTRAMUSCULAR | Status: DC

## 2020-06-21 MED ORDER — MIDAZOLAM HCL 2 MG/2ML IJ SOLN
INTRAMUSCULAR | Status: AC
  Filled 2020-06-21: qty 2

## 2020-06-21 MED ORDER — POLYETHYLENE GLYCOL 3350 17 G PO PACK: 17.0000 g | PACK | Freq: Every day | ORAL | Status: DC | PRN

## 2020-06-21 MED ORDER — SUCCINYLCHOLINE CHLORIDE 200 MG/10ML IV SOSY
PREFILLED_SYRINGE | INTRAVENOUS | Status: AC
  Filled 2020-06-21: qty 10

## 2020-06-21 MED ORDER — UMECLIDINIUM BROMIDE 62.5 MCG/INH IN AEPB
1.0000 | INHALATION_SPRAY | Freq: Every day | RESPIRATORY_TRACT | Status: DC
  Administered 2020-06-22: 1 via RESPIRATORY_TRACT
  Filled 2020-06-21: qty 7

## 2020-06-21 MED ORDER — SODIUM CHLORIDE 0.9 % IV SOLN: INTRAVENOUS | Status: DC

## 2020-06-21 MED ORDER — CLOPIDOGREL BISULFATE 75 MG PO TABS
75.0000 mg | ORAL_TABLET | Freq: Every day | ORAL | Status: DC
  Administered 2020-06-21 – 2020-06-22 (×2): 75 mg via ORAL
  Filled 2020-06-21 (×3): qty 1

## 2020-06-21 MED ORDER — ACETAMINOPHEN 325 MG PO TABS: 325.0000 mg | ORAL_TABLET | ORAL | Status: DC | PRN

## 2020-06-21 MED ORDER — DOCUSATE SODIUM 100 MG PO CAPS
100.0000 mg | ORAL_CAPSULE | Freq: Every day | ORAL | Status: DC
  Administered 2020-06-22: 100 mg via ORAL
  Filled 2020-06-21: qty 1

## 2020-06-21 MED ORDER — CO Q 10 100 MG PO CAPS: 100.0000 mg | ORAL_CAPSULE | Freq: Every day | ORAL | Status: DC

## 2020-06-21 MED ORDER — DORZOLAMIDE HCL-TIMOLOL MAL 2-0.5 % OP SOLN
1.0000 [drp] | Freq: Two times a day (BID) | OPHTHALMIC | Status: DC
  Administered 2020-06-21 – 2020-06-22 (×2): 1 [drp] via OPHTHALMIC
  Filled 2020-06-21: qty 10

## 2020-06-21 MED ORDER — GUAIFENESIN-DM 100-10 MG/5ML PO SYRP: 15.0000 mL | ORAL_SOLUTION | ORAL | Status: DC | PRN

## 2020-06-21 MED ORDER — SODIUM CHLORIDE 0.9 % IV SOLN: 500.0000 mL | Freq: Once | INTRAVENOUS | Status: DC | PRN

## 2020-06-21 MED ORDER — PHENYLEPHRINE HCL-NACL 10-0.9 MG/250ML-% IV SOLN
INTRAVENOUS | Status: DC | PRN
  Administered 2020-06-21: 40 ug/min via INTRAVENOUS

## 2020-06-21 MED ORDER — LACTATED RINGERS IV SOLN: INTRAVENOUS | Status: DC | PRN

## 2020-06-21 MED ORDER — ASPIRIN EC 81 MG PO TBEC
81.0000 mg | DELAYED_RELEASE_TABLET | Freq: Every day | ORAL | Status: DC
  Administered 2020-06-22: 81 mg via ORAL
  Filled 2020-06-21: qty 1

## 2020-06-21 MED ORDER — PROTAMINE SULFATE 10 MG/ML IV SOLN
INTRAVENOUS | Status: DC | PRN
  Administered 2020-06-21: 50 mg via INTRAVENOUS

## 2020-06-21 MED ORDER — CHLORHEXIDINE GLUCONATE 0.12 % MT SOLN: 15.0000 mL | Freq: Once | OROMUCOSAL | Status: AC

## 2020-06-21 MED ORDER — BRIMONIDINE TARTRATE 0.15 % OP SOLN
1.0000 [drp] | Freq: Two times a day (BID) | OPHTHALMIC | Status: DC
  Administered 2020-06-21 – 2020-06-22 (×2): 1 [drp] via OPHTHALMIC
  Filled 2020-06-21: qty 5

## 2020-06-21 MED ORDER — ROCURONIUM BROMIDE 10 MG/ML (PF) SYRINGE
PREFILLED_SYRINGE | INTRAVENOUS | Status: DC | PRN
  Administered 2020-06-21: 50 mg via INTRAVENOUS

## 2020-06-21 MED ORDER — ATENOLOL 25 MG PO TABS
25.0000 mg | ORAL_TABLET | Freq: Every day | ORAL | Status: DC
  Administered 2020-06-22: 25 mg via ORAL
  Filled 2020-06-21: qty 1

## 2020-06-21 MED ORDER — 0.9 % SODIUM CHLORIDE (POUR BTL) OPTIME
TOPICAL | Status: DC | PRN
  Administered 2020-06-21: 1000 mL

## 2020-06-21 MED ORDER — ONDANSETRON HCL 4 MG/2ML IJ SOLN: 4.0000 mg | Freq: Four times a day (QID) | INTRAMUSCULAR | Status: DC | PRN

## 2020-06-21 MED ORDER — HEPARIN SODIUM (PORCINE) 1000 UNIT/ML IJ SOLN
INTRAMUSCULAR | Status: AC
  Filled 2020-06-21: qty 1

## 2020-06-21 MED ORDER — FENTANYL CITRATE (PF) 250 MCG/5ML IJ SOLN
INTRAMUSCULAR | Status: AC
  Filled 2020-06-21: qty 5

## 2020-06-21 MED ORDER — MAGNESIUM SULFATE 2 GM/50ML IV SOLN: 2.0000 g | Freq: Every day | INTRAVENOUS | Status: DC | PRN

## 2020-06-21 MED ORDER — SODIUM CHLORIDE 0.9 % IV SOLN
INTRAVENOUS | Status: AC
  Filled 2020-06-21: qty 1.2

## 2020-06-21 MED ORDER — FENTANYL CITRATE (PF) 100 MCG/2ML IJ SOLN
INTRAMUSCULAR | Status: AC
  Filled 2020-06-21: qty 2

## 2020-06-21 MED ORDER — HEPARIN SODIUM (PORCINE) 1000 UNIT/ML IJ SOLN
INTRAMUSCULAR | Status: DC | PRN
  Administered 2020-06-21: 6000 [IU] via INTRAVENOUS
  Administered 2020-06-21: 3000 [IU] via INTRAVENOUS

## 2020-06-21 MED ORDER — PROPOFOL 10 MG/ML IV BOLUS
INTRAVENOUS | Status: DC | PRN
  Administered 2020-06-21: 130 mg via INTRAVENOUS

## 2020-06-21 MED ORDER — ONDANSETRON HCL 4 MG/2ML IJ SOLN
INTRAMUSCULAR | Status: DC | PRN
  Administered 2020-06-21: 4 mg via INTRAVENOUS

## 2020-06-21 MED ORDER — FENTANYL CITRATE (PF) 100 MCG/2ML IJ SOLN
INTRAMUSCULAR | Status: DC | PRN
  Administered 2020-06-21 (×2): 25 ug via INTRAVENOUS
  Administered 2020-06-21: 100 ug via INTRAVENOUS

## 2020-06-21 MED ORDER — BISACODYL 10 MG RE SUPP: 10.0000 mg | Freq: Every day | RECTAL | Status: DC | PRN

## 2020-06-21 MED ORDER — MIDAZOLAM HCL 5 MG/5ML IJ SOLN
INTRAMUSCULAR | Status: DC | PRN
  Administered 2020-06-21: 2 mg via INTRAVENOUS

## 2020-06-21 MED ORDER — VANCOMYCIN HCL IN DEXTROSE 1-5 GM/200ML-% IV SOLN
1000.0000 mg | INTRAVENOUS | Status: AC
  Administered 2020-06-21: 1000 mg via INTRAVENOUS
  Filled 2020-06-21: qty 200

## 2020-06-21 MED ORDER — HYDRALAZINE HCL 20 MG/ML IJ SOLN: 5.0000 mg | INTRAMUSCULAR | Status: DC | PRN

## 2020-06-21 MED ORDER — TRAZODONE HCL 100 MG PO TABS
100.0000 mg | ORAL_TABLET | Freq: Every day | ORAL | Status: DC
  Administered 2020-06-21: 100 mg via ORAL
  Filled 2020-06-21: qty 1

## 2020-06-21 MED ORDER — TIOTROPIUM BROMIDE MONOHYDRATE 18 MCG IN CAPS: 18.0000 ug | ORAL_CAPSULE | Freq: Every morning | RESPIRATORY_TRACT | Status: DC

## 2020-06-21 MED ORDER — PHENOL 1.4 % MT LIQD: 1.0000 | OROMUCOSAL | Status: DC | PRN

## 2020-06-21 MED ORDER — NITROGLYCERIN 0.4 MG SL SUBL: 0.4000 mg | SUBLINGUAL_TABLET | SUBLINGUAL | Status: DC | PRN

## 2020-06-21 MED ORDER — LABETALOL HCL 5 MG/ML IV SOLN: 10.0000 mg | INTRAVENOUS | Status: DC | PRN

## 2020-06-21 MED ORDER — LIDOCAINE 2% (20 MG/ML) 5 ML SYRINGE
INTRAMUSCULAR | Status: DC | PRN
  Administered 2020-06-21: 60 mg via INTRAVENOUS

## 2020-06-21 MED ORDER — ALBUTEROL SULFATE HFA 108 (90 BASE) MCG/ACT IN AERS
2.0000 | INHALATION_SPRAY | RESPIRATORY_TRACT | Status: DC | PRN
  Filled 2020-06-21: qty 6.7

## 2020-06-21 MED ORDER — HYDROCODONE-ACETAMINOPHEN 5-325 MG PO TABS
0.5000 | ORAL_TABLET | Freq: Four times a day (QID) | ORAL | Status: DC | PRN
  Administered 2020-06-21 – 2020-06-22 (×2): 1 via ORAL
  Filled 2020-06-21 (×3): qty 1

## 2020-06-21 MED ORDER — ORAL CARE MOUTH RINSE: 15.0000 mL | Freq: Once | OROMUCOSAL | Status: AC

## 2020-06-21 MED ORDER — DEXAMETHASONE SODIUM PHOSPHATE 10 MG/ML IJ SOLN
INTRAMUSCULAR | Status: DC | PRN
  Administered 2020-06-21: 10 mg via INTRAVENOUS

## 2020-06-21 MED ORDER — ATORVASTATIN CALCIUM 80 MG PO TABS
80.0000 mg | ORAL_TABLET | ORAL | Status: DC
  Administered 2020-06-22: 80 mg via ORAL
  Filled 2020-06-21: qty 1

## 2020-06-21 MED ORDER — ALUM & MAG HYDROXIDE-SIMETH 200-200-20 MG/5ML PO SUSP: 15.0000 mL | ORAL | Status: DC | PRN

## 2020-06-21 MED ORDER — ROCURONIUM BROMIDE 10 MG/ML (PF) SYRINGE
PREFILLED_SYRINGE | INTRAVENOUS | Status: AC
  Filled 2020-06-21: qty 10

## 2020-06-21 MED ORDER — PROTAMINE SULFATE 10 MG/ML IV SOLN
INTRAVENOUS | Status: AC
  Filled 2020-06-21: qty 25

## 2020-06-21 MED ORDER — LATANOPROST 0.005 % OP SOLN
1.0000 [drp] | Freq: Every day | OPHTHALMIC | Status: DC
  Administered 2020-06-21: 1 [drp] via OPHTHALMIC
  Filled 2020-06-21: qty 2.5

## 2020-06-21 MED ORDER — PROPOFOL 10 MG/ML IV BOLUS
INTRAVENOUS | Status: AC
  Filled 2020-06-21: qty 20

## 2020-06-21 MED ORDER — ACETAMINOPHEN 650 MG RE SUPP: 325.0000 mg | RECTAL | Status: DC | PRN

## 2020-06-21 MED ORDER — PANTOPRAZOLE SODIUM 40 MG PO TBEC
40.0000 mg | DELAYED_RELEASE_TABLET | Freq: Every day | ORAL | Status: DC
  Administered 2020-06-21 – 2020-06-22 (×2): 40 mg via ORAL
  Filled 2020-06-21 (×3): qty 1

## 2020-06-21 MED ORDER — VANCOMYCIN HCL 1000 MG/200ML IV SOLN
1000.0000 mg | Freq: Two times a day (BID) | INTRAVENOUS | Status: AC
  Administered 2020-06-21 – 2020-06-22 (×2): 1000 mg via INTRAVENOUS
  Filled 2020-06-21 (×2): qty 200

## 2020-06-21 MED ORDER — POTASSIUM CHLORIDE CRYS ER 20 MEQ PO TBCR: 20.0000 meq | EXTENDED_RELEASE_TABLET | Freq: Every day | ORAL | Status: DC | PRN

## 2020-06-21 MED ORDER — METOPROLOL TARTRATE 5 MG/5ML IV SOLN
2.0000 mg | INTRAVENOUS | Status: DC | PRN
Start: 2020-06-21 — End: 2020-06-22

## 2020-06-21 MED ORDER — FENTANYL CITRATE (PF) 100 MCG/2ML IJ SOLN
25.0000 ug | INTRAMUSCULAR | Status: DC | PRN
  Administered 2020-06-21: 50 ug via INTRAVENOUS

## 2020-06-21 SURGICAL SUPPLY — 71 items
ADH SKN CLS APL DERMABOND .7 (GAUZE/BANDAGES/DRESSINGS) ×4
CANISTER SUCT 3000ML PPV (MISCELLANEOUS) ×3 IMPLANT
CATH BEACON 5 .035 65 VANSC3 (CATHETERS) ×1 IMPLANT
CATH BEACON 5.038 65CM KMP-01 (CATHETERS) ×1 IMPLANT
CATH OMNI FLUSH .035X70CM (CATHETERS) IMPLANT
CLIP VESOCCLUDE MED 6/CT (CLIP) ×1 IMPLANT
CLIP VESOCCLUDE SM WIDE 6/CT (CLIP) ×1 IMPLANT
CLOSURE PERCLOSE PROSTYLE (VASCULAR PRODUCTS) ×1 IMPLANT
COVER WAND RF STERILE (DRAPES) ×2 IMPLANT
DERMABOND ADVANCED (GAUZE/BANDAGES/DRESSINGS) ×2
DERMABOND ADVANCED .7 DNX12 (GAUZE/BANDAGES/DRESSINGS) ×2 IMPLANT
DEVICE CLOSURE PERCLS PRGLD 6F (VASCULAR PRODUCTS) IMPLANT
DEVICE TORQUE KENDALL .025-038 (MISCELLANEOUS) ×3 IMPLANT
DRSG TEGADERM 2-3/8X2-3/4 SM (GAUZE/BANDAGES/DRESSINGS) ×6 IMPLANT
DRYSEAL FLEXSHEATH 12FR 33CM (SHEATH) ×1
DRYSEAL FLEXSHEATH 18FR 33CM (SHEATH) ×1
ELECT REM PT RETURN 9FT ADLT (ELECTROSURGICAL) ×6
ELECTRODE REM PT RTRN 9FT ADLT (ELECTROSURGICAL) ×4 IMPLANT
EXCLDR TRNK ENDO 32X14.5X14 18 (Endovascular Graft) ×3 IMPLANT
EXCLUDER TNK END 32X14.5X14 18 (Endovascular Graft) IMPLANT
GAUZE SPONGE 2X2 8PLY STRL LF (GAUZE/BANDAGES/DRESSINGS) ×2 IMPLANT
GLOVE BIO SURGEON STRL SZ7.5 (GLOVE) ×3 IMPLANT
GLOVE SRG 8 PF TXTR STRL LF DI (GLOVE) ×2 IMPLANT
GLOVE SURG UNDER POLY LF SZ8 (GLOVE) ×3
GOWN STRL REUS W/ TWL LRG LVL3 (GOWN DISPOSABLE) ×6 IMPLANT
GOWN STRL REUS W/ TWL XL LVL3 (GOWN DISPOSABLE) ×2 IMPLANT
GOWN STRL REUS W/TWL LRG LVL3 (GOWN DISPOSABLE) ×6
GOWN STRL REUS W/TWL XL LVL3 (GOWN DISPOSABLE) ×3
GRAFT BALLN CATH 65CM (STENTS) IMPLANT
GUIDEWIRE ANGLED .035X150CM (WIRE) ×4 IMPLANT
KIT BASIN OR (CUSTOM PROCEDURE TRAY) ×3 IMPLANT
KIT DRAIN CSF ACCUDRAIN (MISCELLANEOUS) IMPLANT
KIT TURNOVER KIT B (KITS) ×3 IMPLANT
LEG CONTRALATERAL 16X14.5X10 (Vascular Products) ×1 IMPLANT
LEG CONTRALATERAL 16X14.5X12 (Vascular Products) ×1 IMPLANT
LOOP VESSEL MAXI BLUE (MISCELLANEOUS) ×1 IMPLANT
LOOP VESSEL MINI RED (MISCELLANEOUS) ×1 IMPLANT
NS IRRIG 1000ML POUR BTL (IV SOLUTION) ×3 IMPLANT
PACK ENDOVASCULAR (PACKS) ×3 IMPLANT
PAD ARMBOARD 7.5X6 YLW CONV (MISCELLANEOUS) ×6 IMPLANT
PENCIL BUTTON HOLSTER BLD 10FT (ELECTRODE) ×2 IMPLANT
PERCLOSE PROGLIDE 6F (VASCULAR PRODUCTS) ×12
SET MICROPUNCTURE 5F STIFF (MISCELLANEOUS) ×3 IMPLANT
SHEATH BRITE TIP 8FR 23CM (SHEATH) ×1 IMPLANT
SHEATH DRYSEAL FLEX 12FR 33CM (SHEATH) IMPLANT
SHEATH DRYSEAL FLEX 18FR 33CM (SHEATH) IMPLANT
SHEATH PINNACLE 8F 10CM (SHEATH) ×1 IMPLANT
SPONGE GAUZE 2X2 STER 10/PKG (GAUZE/BANDAGES/DRESSINGS)
STENT GRAFT BALLN CATH 65CM (STENTS)
STOPCOCK MORSE 400PSI 3WAY (MISCELLANEOUS) ×3 IMPLANT
SUT ETHILON 3 0 PS 1 (SUTURE) IMPLANT
SUT MNCRL AB 4-0 PS2 18 (SUTURE) ×6 IMPLANT
SUT PROLENE 5 0 C 1 24 (SUTURE) IMPLANT
SUT PROLENE 5 0 C 1 36 (SUTURE) ×1 IMPLANT
SUT PROLENE 6 0 BV (SUTURE) ×7 IMPLANT
SUT SILK 2 0 (SUTURE) ×3
SUT SILK 2-0 18XBRD TIE 12 (SUTURE) IMPLANT
SUT SILK 3 0 (SUTURE) ×3
SUT SILK 3-0 18XBRD TIE 12 (SUTURE) IMPLANT
SUT SILK 4 0 (SUTURE) ×3
SUT SILK 4-0 18XBRD TIE 12 (SUTURE) IMPLANT
SUT VIC AB 2-0 CTX 36 (SUTURE) IMPLANT
SUT VIC AB 3-0 SH 27 (SUTURE) ×9
SUT VIC AB 3-0 SH 27X BRD (SUTURE) IMPLANT
SYR 20ML LL LF (SYRINGE) ×3 IMPLANT
SYR BULB IRRIG 60ML STRL (SYRINGE) ×1 IMPLANT
TOWEL GREEN STERILE (TOWEL DISPOSABLE) ×3 IMPLANT
TRAY FOLEY MTR SLVR 16FR STAT (SET/KITS/TRAYS/PACK) ×3 IMPLANT
TUBING HIGH PRESSURE 120CM (CONNECTOR) ×3 IMPLANT
WIRE AMPLATZ SS-J .035X180CM (WIRE) ×1 IMPLANT
WIRE BENTSON .035X145CM (WIRE) IMPLANT

## 2020-06-21 NOTE — H&P (Signed)
History and Physical Interval Note:  06/21/2020 9:29 AM  Connor Cuevas  has presented today for surgery, with the diagnosis of AAA.  The various methods of treatment have been discussed with the patient and family. After consideration of risks, benefits and other options for treatment, the patient has consented to  Procedure(s): ABDOMINAL AORTIC ENDOVASCULAR STENT GRAFT (N/A) as a surgical intervention.  The patient's history has been reviewed, patient examined, no change in status, stable for surgery.  I have reviewed the patient's chart and labs.  Questions were answered to the patient's satisfaction.    EVAR for stent graft repair of 5.9 cm AAA.    Marty Heck  Patient name: Connor Cuevas            MRN: [id number]        DOB: Sep 13, 1940          Sex: male  REASON FOR CONSULT: F/U to discuss enlarging abdominal aortic aneurysm after CTA  HPI: Connor Cuevas is a 80 y.o. male, with history of coronary artery disease status post PCI, HLD, and COPD that presents to discuss CTA results for enlarging AAA.  Patient has a known AAA since 2014.  I last saw him in 2020 and it measured 4.4 cm.  He has had several duplexes since I last saw him and it measured 4.7 cm in January 2021 and most recently enlarged to 5.3 cm in January 2022 at his cardiologist office.  He was sent back to see me for enlarging AAA on duplex.  CTA was further obtained 06/10/2020 and this showed enlarging infrarenal aneurysm measuring up to 5.7 cm per the radiology read.  Patient denies any history of abdominal surgery other than a right inguinal hernia repair.  He is on Plavix for his coronary stent.  No new abdominal pain at this time.  He wants to move forward with getting his aneurysm repaired.      Past Medical History:  Diagnosis Date  . AAA (abdominal aortic aneurysm) ([facility]) 04/16/2018   4.3 cm, noted on [place]   . Acute kidney failure ([place]) 2005 approx   after ureteroscopy, hospitalized for about 10 days  afterwards  . Anxiety   . Arthritis    Hands, wrist, elbow, back and spine  . Bulging lumbar disc    L4-L5 degeneration, Chronic back pain  . CAD (coronary artery disease)    a. 01/09/14: [place] s/p DES to RCA   . COPD (chronic obstructive pulmonary disease) ([facility])    wheezing  . Dyspnea    with exertion  . Glaucoma of both eyes   . Hepatic cyst 2013   Several tiny hepatic cysts , noted on CT  . History of bilateral inguinal hernias   . History of kidney stones    small kidney stones in right kidney  . Hyperlipidemia   . Interstitial lung disease ([place])   . Leg pain    ABIs (10/15):  R 1.3, L 1.2, normal  . Orthopnea    uses 2 pillows  . Pneumonia   . Pulmonary nodule, right 08/02/2016   a. Right upper lobe  . Right ureteral stone   . Seasonal allergies   . 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 Aortosclerosis, bilateral - mild with calcification. Per Dr. Moreen Fowler at [facility]   . Tobacco abuse   . Wears dentures    upper   .  Wears glasses          Past Surgical History:  Procedure Laterality Date  . CARDIAC CATHETERIZATION  2017   right carotid stent  . CATARACT EXTRACTION W/ INTRAOCULAR LENS  IMPLANT, BILATERAL  1997 (APPROX)  . CATARACT EXTRACTION W/PHACO Left 03/31/2020   Procedure: CATARACT EXTRACTION PHACO AND INTRAOCULAR LENS PLACEMENT (IOC) LEFT 5.51 01:16.1 7.2%;  Surgeon: Leandrew Koyanagi, MD;  Location: [facility];  Service: Ophthalmology;  Laterality: Left;  . CYSTOSCOPY/RETROGRADE/URETEROSCOPY/STONE EXTRACTION WITH BASKET  10/17/2011   Procedure: CYSTOSCOPY/RETROGRADE/URETEROSCOPY/STONE EXTRACTION WITH BASKET;  Surgeon: Hanley Ben, MD;  Location: [place];  Service: Urology;  Laterality: Right;  . CYSTOSCOPY/URETEROSCOPY/HOLMIUM LASER/STENT PLACEMENT Right 05/13/2018   Procedure: CYSTOSCOPY/RETROGRADE/URETEROSCOPY/HOLMIUM LASER;   Surgeon: Kathie Rhodes, MD;  Location[facility];  Service: Urology;  Laterality: Right;  . HERNIA REPAIR    . LAPAROSCOPIC LEFT INGUINAL (OBTURATOR) HERNIA AND UMBILICAL HERNIA REPAIR  01-13-2011  . LEFT HEART CATHETERIZATION WITH CORONARY ANGIOGRAM N/A 01/09/2014   Procedure: LEFT HEART CATHETERIZATION WITH CORONARY ANGIOGRAM;  Surgeon: Josue Hector, MD;  Location: [facility] CATH LAB;  Service: Cardiovascular;  Laterality: N/A;  . LEFT URETEROSCOPIC STONE EXTRACTION  11-30-2000   W/ PREVIOUS ESWL AND URETERAL STENT PLACEMENT  . PHOTOCOAGULATION Right 12/10/2019   Procedure: TRANSCLEARAL DIODE CYCLOPHOTOCOAGULATION RIGHT;  Surgeon: Leandrew Koyanagi, MD;  Location: [place];  Service: Ophthalmology;  Laterality: Right;  . REPAIR RIGHT INGUINAL HERNIA W/ MESH  12-31-2000  . TONSILLECTOMY    . URETEROLITHOTOMY  2009         Family History  Problem Relation Age of Onset  . Hypertension Mother   . Heart attack Other        uncle  . Stroke Other   . Stroke Maternal Grandmother   . Stroke Maternal Grandfather   . Stroke Paternal Grandmother   . Stroke Paternal Grandfather     SOCIAL HISTORY: Social History        Socioeconomic History  . Marital status: Married    Spouse name: Not on file  . Number of children: Not on file  . Years of education: Not on file  . Highest education level: Not on file  Occupational History  . Not on file  Tobacco Use  . Smoking status: Former Smoker    Packs/day: 1.00    Years: 53.00    Pack years: 53.00    Types: Cigarettes    Quit date: 03/11/2018    Years since quitting: 2.2  . Smokeless tobacco: Never Used  Vaping Use  . Vaping Use: Never used  Substance and Sexual Activity  . Alcohol use: No  . Drug use: No  . Sexual activity: Yes  Other Topics Concern  . Not on file  Social History Narrative  . Not on file   Social Determinants of Health   Financial Resource Strain:  Not on file  Food Insecurity: Not on file  Transportation Needs: Not on file  Physical Activity: Not on file  Stress: Not on file  Social Connections: Not on file  Intimate Partner Violence: Not on file         Allergies  Allergen Reactions  . Cefaclor Anaphylaxis, Rash and Swelling    Throat closed  . Nitrofurantoin Anaphylaxis    Right kidney shut down   . Chantix [Varenicline] Other (See Comments)    nightmares  . Nsaids Other (See Comments)  . Other Other (See Comments)  Current Outpatient Medications  Medication Sig Dispense Refill  . atenolol (TENORMIN) 25 MG tablet Take 1 tablet (25 mg total) by mouth daily. 90 tablet 3  . atorvastatin (LIPITOR) 80 MG tablet Take 1 tablet (80 mg total) by mouth every morning. 90 tablet 3  . brimonidine (ALPHAGAN P) 0.1 % SOLN Place 1 drop into the right eye 2 (two) times daily.    . clopidogrel (PLAVIX) 75 MG tablet Take 1 tablet by mouth once daily 90 tablet 0  . Coenzyme Q10 (CO Q 10) 100 MG CAPS Take 100 mg by mouth daily.    . dorzolamide-timolol (COSOPT) 22.3-6.8 MG/ML ophthalmic solution Place 1 drop into both eyes 2 (two) times daily.    Marland Kitchen gabapentin (NEURONTIN) 100 MG capsule Take 1 capsule (100 mg total) by mouth 3 (three) times daily. (Patient taking differently: Take 100 mg by mouth 2 (two) times daily.) 90 capsule 0  . HYDROcodone-acetaminophen (NORCO) 5-325 MG per tablet Take 0.5 tablets by mouth every 6 (six) hours as needed for moderate pain. For pain    . latanoprost (XALATAN) 0.005 % ophthalmic solution 1 drop at bedtime.    . methocarbamol (ROBAXIN) 500 MG tablet TAKE 1 TABLET BY MOUTH EVERY 8 HOURS AS NEEDED 30 tablet 0  . Multiple Vitamins-Minerals (ICAPS AREDS FORMULA PO) Take 2 tablets by mouth daily.     . nitroGLYCERIN (NITROSTAT) 0.4 MG SL tablet Place 1 tablet (0.4 mg total) under the tongue every 5 (five) minutes as needed for chest pain. 25 tablet 12  . OMEGA-3 KRILL OIL PO Take 200  mg by mouth daily.    Marland Kitchen PROAIR HFA 108 (90 Base) MCG/ACT inhaler INHALE 2 PUFFS BY MOUTH EVERY 4 HOURS AS NEEDED FOR WHEEZING    . pseudoephedrine (SUDAFED) 120 MG 12 hr tablet Take 120 mg by mouth 2 (two) times daily.    Marland Kitchen tiotropium (SPIRIVA) 18 MCG inhalation capsule Place 18 mcg into inhaler and inhale every morning.    . traZODone (DESYREL) 100 MG tablet Take 100 mg by mouth at bedtime.      No current facility-administered medications for this visit.    REVIEW OF SYSTEMS:  [X]  denotes positive finding, [ ]  denotes negative finding Cardiac  Comments:  Chest pain or chest pressure:    Shortness of breath upon exertion:    Short of breath when lying flat:    Irregular heart rhythm:        Vascular    Pain in calf, thigh, or hip brought on by ambulation:    Pain in feet at night that wakes you up from your sleep:     Blood clot in your veins:    Leg swelling:         Pulmonary    Oxygen at home:    Productive cough:     Wheezing:         Neurologic    Sudden weakness in arms or legs:     Sudden numbness in arms or legs:     Sudden onset of difficulty speaking or slurred speech:    Temporary loss of vision in one eye:     Problems with dizziness:         Gastrointestinal    Blood in stool:     Vomited blood:         Genitourinary    Burning when urinating:     Blood in urine:        Psychiatric  Major depression:         Hematologic    Bleeding problems:    Problems with blood clotting too easily:        Skin    Rashes or ulcers:        Constitutional    Fever or chills:      PHYSICAL EXAM: Vitals:   06/15/20 0926  BP: 121/76  Pulse: 67  Resp: 16  Temp: 97.7 F (36.5 C)  TempSrc: Temporal  SpO2: 93%  Weight: 127 lb (57.6 kg)  Height: 5\' 9"  (1.753 m)    GENERAL: The patient is a well-nourished male, in no acute distress. The vital  signs are documented above. CARDIAC: There is a regular rate and rhythm.  VASCULAR:  2+ femoral pulse palpable bilateral groins 2+ palpable DP pulses bilatearlly PULMONARY: There is good air exchange bilaterally without wheezing or rales. ABDOMEN: Soft and non-tender.  No pain with palpation of aneurysm. MUSCULOSKELETAL: There are no major deformities or cyanosis. NEUROLOGIC: No focal weakness or paresthesias are detected.   DATA:   CTA 06/10/2020 on my review shows a 5.9 cm infrarenal abdominal aortic aneurysm based on sagittal measurements with a fairly angulated neck and a segment of right external iliac artery stenosis.  Assessment/Plan:  80 year old male presents for evaluation of enlarging abdominal aortic aneurysm.  Ultimately a CTA was obtained given the patient's concern for the growth in the aneurysm and this shows a 5.9 cm infrarenal abdominal aortic aneurysm based on sagittal measurements.  I have recommended stent graft repair as I discussed with him and his wife today.  We discussed this being done under general anesthesia with bilateral common femoral percutaneous access.  I did discuss that he has a fairly angulated neck but given a long neck with the new conformable devices this should be amenable to stent graft repair with good seal.  Risks and benefits were discussed including vessel injury, groin bleed, endoleak, risk of anesthesia, heart attack, stroke etc.  We will get him scheduled for Monday.  He can continue plavix from my standpoint.  I did discuss incidental findings on the CT including thickening of the GE junction and transverse colon and I referred him back to his GI doctor Dr. Michail Sermon.  I also discussed the concern for right renal mass and referred him back to his urologist Dr. Roswell Miners with [facility] for further evaluation.    Marty Heck, MD [facility] Office: [phone]

## 2020-06-21 NOTE — Transfer of Care (Signed)
Immediate Anesthesia Transfer of Care Note  Patient: Connor Cuevas  Procedure(s) Performed: ABDOMINAL AORTIC ENDOVASCULAR STENT GRAFT (Abdomen) ULTRASOUND GUIDANCE FOR VASCULAR ACCESS (Bilateral Groin)  Patient Location: PACU  Anesthesia Type:General  Level of Consciousness: drowsy  Airway & Oxygen Therapy: Patient Spontanous Breathing and Patient connected to face mask oxygen  Post-op Assessment: Report given to RN and Post -op Vital signs reviewed and stable  Post vital signs: Reviewed and stable  Last Vitals:  Vitals Value Taken Time  BP 135/76 06/21/20 1230  Temp    Pulse 66 06/21/20 1232  Resp 15 06/21/20 1232  SpO2 100 % 06/21/20 1232  Vitals shown include unvalidated device data.  Last Pain:  Vitals:   06/21/20 0826  PainSc: 4       Patients Stated Pain Goal: 3 (90/90/30 1499)  Complications: No complications documented.

## 2020-06-21 NOTE — Anesthesia Preprocedure Evaluation (Addendum)
Anesthesia Evaluation  Patient identified by MRN, date of birth, ID band Patient awake    Reviewed: Allergy & Precautions, H&P , NPO status , Patient's Chart, lab work & pertinent test results, reviewed documented beta blocker date and time   Airway Mallampati: II  TM Distance: >3 FB Neck ROM: Full    Dental no notable dental hx. (+) Edentulous Upper, Dental Advisory Given   Pulmonary COPD,  COPD inhaler, former smoker,    Pulmonary exam normal breath sounds clear to auscultation       Cardiovascular + CAD, + Cardiac Stents and + Peripheral Vascular Disease   Rhythm:Regular Rate:Normal     Neuro/Psych Anxiety negative neurological ROS     GI/Hepatic negative GI ROS, Neg liver ROS,   Endo/Other  negative endocrine ROS  Renal/GU negative Renal ROS  negative genitourinary   Musculoskeletal  (+) Arthritis , Osteoarthritis,    Abdominal   Peds  Hematology negative hematology ROS (+)   Anesthesia Other Findings   Reproductive/Obstetrics negative OB ROS                            Anesthesia Physical Anesthesia Plan  ASA: III  Anesthesia Plan: General   Post-op Pain Management:    Induction: Intravenous  PONV Risk Score and Plan: 3 and Ondansetron, Dexamethasone and Treatment may vary due to age or medical condition  Airway Management Planned: Oral ETT  Additional Equipment: Arterial line  Intra-op Plan:   Post-operative Plan: Extubation in OR  Informed Consent: I have reviewed the patients History and Physical, chart, labs and discussed the procedure including the risks, benefits and alternatives for the proposed anesthesia with the patient or authorized representative who has indicated his/her understanding and acceptance.     Dental advisory given  Plan Discussed with: CRNA  Anesthesia Plan Comments:         Anesthesia Quick Evaluation

## 2020-06-21 NOTE — Op Note (Signed)
Date: June 21, 2020  Preoperative diagnosis: 5.9 cm infrarenal abdominal aortic aneurysm  Postoperative diagnosis: Same  Procedure: 1.  Ultrasound-guided access of bilateral common femoral arteries with percutaneous closure for delivery of endograft 2.  Aortogram with catheter selection of aorta 3.  Endovascular repair of infrarenal abdominal aortic aneurysm using aortobi-iliac stent graft 4.  Open exposure of bilateral common femoral arteries for repair of both the right and left common femoral artery  Surgeon: Dr. Marty Heck, MD  Assistant: Dr. Deitra Mayo, MD  Indications: Patient is a 80 year old male that vascular surgery has been following for an abdominal aortic aneurysm.  He was recently seen in surveillance and noted to have increasing size of the aneurysm and on CT confirmed that this was 5.9 cm.  He presents today for stent graft repair after risk benefits discussed.  An assistant was needed for exposure and to expedite the case.  Findings: Ultrasound-guided percutaneous access of bilateral common femoral arteries with attempted percutaneous closure.  Ultimately 12 French sheath was placed in the right common femoral artery and a 18 French sheath in the left common femoral artery.  Successful treatment of infrarenal abdominal aortic aneurysm using a 32 mm x 14 mm x 14 cm main body left with a 14 mm x 10 cm bell bottom into left common iliac artery and also a 14 mm x 12 cm bell bottom into the right common iliac artery.  Excellent seal with no evidence of endoleak and appropriate exclusion of aneurysm.  Ultimately had to cut down on both common femoral arteries for open repair after percutaneous closure failed.  Brisk Doppler signals at completion in both feet.  Anesthesia: General  Details: Patient was taken to the operating room after informed consent was obtained.  Placed on the operative table supine position.  Ultimately General endotracheal anesthesia was  induced.  His bilateral groins and abdominal wall was then prepped and draped in the usual sterile fashion.  Timeout was performed to identify patient, procedure and site.  He did get preoperative antibiotics.  Ultimately initially evaluated the left common femoral artery with ultrasound it was patent and image was saved.  This was accessed with a micro access needle in the left common femoral artery placed a microwire.  I then made a stab incision around the wire for delivery of the Perclose devices.  We placed a Bentson wire and then an 8 [place] dilator.  We then deployed Perclose devices at 10:00 and 2:00 in the left common femoral artery.  We then proceeded to do the same in the right groin evaluating the right common femoral artery with ultrasound it was patent and image was saved.  This was accessed with micro access needle and microwire and placed a Bentson wire and a short 8 French sheath and then ultimately initially placed a Perclose device on the medial side.  The delivery system got stuck in the artery and would not come out without significant tension.  We then made a small transverse incision over the artery dissected down with Bovie cautery and used a small wheatlander for added exposure.  Dr. Scot Dock then used Metzenbaum scissors to dissect out the common femoral artery and we were able to cut the suture in the Perclose device and then safely remove this while inserting a wire back into the right common femoral artery.  At that point time we then placed a 8 French sheath in both groins.  Patient was given 100 units/kg heparin IV.  ACT was  checked to maintain greater than 250.  Then under fluoroscopy was able to get our wires up into the suprarenal aorta where we used a KMP catheters and exchanged for Amplatz wires in both groins.  A 12 French Gore dry seal was placed in the right common femoral artery and an 18 French Gore dry seal in the left common femoral artery.  We then placed the main body  device up the left groin 18 French sheath which was a 32 mm x 14 mm x 14 cm.  We then put a pigtail catheter up the right groin.  We then got aortogram at L1 where the right renal was identified on CT which was the lowest.  This was a very angulated neck and ultimately we had initially pushed the device up above the angulation and once we identified the lower right renal we pulled the device down and this was partially deployed.  We were fairly satisfied but it looked like the graft had encroached on the left renal given the angulation.  We then ultimately reconstrained the device and pull this down slightly and then redeployed it again shooting another aortogram that showed excellent wall apposition with patent right and left renal and a very angulated neck.  At that point in time we deployed the main body all the way down to the gate.  We then finished deploying the left limb after we have confirmed patency of both renals.  This was to prevent the graft from slipping given the angulation.  We then came from the right groin with a buddy wire using a KMP and soft angle Glidewire and we were able to cannulate the gate and place a pigtail catheter up in the main body of the graft to spin it to confirm we were in the main body of the graft.  We then put a Amplatz wire back up and then got a retrograde shot on the right with marker pigtail catheter to confirm the hypogastric which was preserved.  We deployed a 14 mm x 12 cm bell bottom into the right common iliac while preserving the hypogastric.  We then pulled the left groin sheath back and again shot a retrograde image identify the left hypogastric artery and deployed at 14 mm x 10 cm bell bottom on the left.  All overlapping components as well as proximally distal endograft were treated with MOB balloon.  We then placed the pigtail catheter back up and shot a final aortogram that showed no evidence of endoleak with excellent filling of the graft and exclusion of the  aneurysm.  That point time we went ahead and got proximal distal control of the right common femoral artery where we had already performed a cutdown.  Ultimately Henly clamp was used proximally and a profunda clamp was used distally on the common femoral.  We removed the sheath under direct visualization as well as the wire in the right common femoral arteriotomy was repaired with 6-0 Prolene in interrupted fashion.  We had excellent Doppler flow in the right common femoral artery.  I then went down to deploy the Perclose devices in the left common femoral artery and ultimately these did not appear to deploy.  We then had to do a cutdown on the left common femoral artery making a small transverse incision over the sheath and dissected down Bovie cautery placed a small wheat Lander and dissected out the common femoral with Metzenbaum scissors and got proximal distal control.  The sheath was then removed  under direct visualization and we placed a Henley clamp for proximal control and a profunda clamp for distal control.  Again we did visualize the lumen and the wire was removed and the arteriotomy was repaired with four 6-0 Prolene interrupted fashion.  We had good Doppler flow in the left.  We checked in both feet had good Doppler flow and 50 mg protamine for reversal.  Both groins were irrigated out and closed with 3-0 Vicryl 4-0 Monocryl and Dermabond.  Taken to recovery in stable condition.  Complication: None  Condition: Stable  Marty Heck, MD [facility] Office: [place]

## 2020-06-21 NOTE — Progress Notes (Signed)
  Day of Surgery Note    Subjective:  Resting comfortably   Vitals:   06/21/20 0727 06/21/20 1230  BP:  135/76  Pulse: 65 66  Resp:  15  Temp:  97.6 F (36.4 C)  SpO2: 94% 100%    Incisions:   Bilateral groin incisions are clean without hematoma Extremities:  Easily palpable DP/PT pusles Cardiac:  regular Lungs:  Non labored Abdomen:  Soft, NT/ND   Assessment/Plan:  This is a 80 y.o. male who is s/p  1.  Ultrasound-guided access of bilateral common femoral arteries with percutaneous closure for delivery of endograft 2.  Aortogram with catheter selection of aorta 3.  Endovascular stent graft of infrarenal abdominal aortic aneurysm using bifurcated endograft 4.  Open exposure of bilateral common femoral arteries for repair of both the right and left common femoral artery  -resting comfortably in pacu -pt with palpable pedal pulses bilaterally  -incisions bilateral groins look fine. -to 4 east later today-anticipate dc tomorrow if tonight is uneventful -PDMP reviewed   Leontine Locket, PA-C 06/21/2020 12:52 PM [phone]

## 2020-06-21 NOTE — Progress Notes (Signed)
PHARMACIST - PHYSICIAN ORDER COMMUNICATION  CONCERNING: P&T Medication Policy on Herbal Medications  DESCRIPTION:  This patient's order for: CoQ 10 100 mg daily has been noted.  This product(s) is classified as an "herbal" or natural product. Due to a lack of definitive safety studies or FDA approval, nonstandard manufacturing practices, plus the potential risk of unknown drug-drug interactions while on inpatient medications, the Pharmacy and Therapeutics Committee does not permit the use of "herbal" or natural products of this type within [facility].   ACTION TAKEN: The pharmacy department is unable to verify this order at this time and your patient has been informed of this safety policy. Please reevaluate patient's clinical condition at discharge and address if the herbal or natural product(s) should be resumed at that time.

## 2020-06-21 NOTE — Anesthesia Postprocedure Evaluation (Signed)
Anesthesia Post Note  Patient: YAXIEL MINNIE  Procedure(s) Performed: ABDOMINAL AORTIC ENDOVASCULAR STENT GRAFT (Abdomen) ULTRASOUND GUIDANCE FOR VASCULAR ACCESS (Bilateral Groin)     Patient location during evaluation: PACU Anesthesia Type: General Level of consciousness: awake and alert Pain management: pain level controlled Vital Signs Assessment: post-procedure vital signs reviewed and stable Respiratory status: spontaneous breathing, nonlabored ventilation and respiratory function stable Cardiovascular status: blood pressure returned to baseline and stable Postop Assessment: no apparent nausea or vomiting Anesthetic complications: no   No complications documented.  Last Vitals:  Vitals:   06/21/20 1542 06/21/20 1600  BP: (!) 178/78 139/82  Pulse: 92 96  Resp: 18 12  Temp: 37 C 37 C  SpO2: 100% 96%    Last Pain:  Vitals:   06/21/20 1542  TempSrc: Oral  PainSc: 8                  Celsa Nordahl,W. EDMOND

## 2020-06-21 NOTE — Discharge Instructions (Signed)
  [facility] [place]   Discharge Instructions  Endovascular Aortic Aneurysm Repair  Please refer to the following instructions for your post-procedure care. Your surgeon or Physician Assistant will discuss any changes with you.  Activity  You are encouraged to walk as much as you can. You can slowly return to normal activities but must avoid strenuous activity and heavy lifting until your doctor tells you it's OK. Avoid activities such as vacuuming or swinging a gold club. It is normal to feel tired for several weeks after your surgery. Do not drive until your doctor gives the OK and you are no longer taking prescription pain medications. It is also normal to have difficulty with sleep habits, eating, and bowel movements after surgery. These will go away with time.  Bathing/Showering  Shower daily after you go home.  Do not soak in a bathtub, hot tub, or swim until the incision heals completely.  If you have incisions in your groin, wash the groin wounds with soap and water daily and pat dry. (No tub bath-only shower)  Then put a dry gauze or washcloth there to keep this area dry to help prevent wound infection daily and as needed.  Do not use Vaseline or neosporin on your incisions.  Only use soap and water on your incisions and then protect and keep dry.  Incision Care  Shower every day. Clean your incision with mild soap and water. Pat the area dry with a clean towel. You do not need a bandage unless otherwise instructed. Do not apply any ointments or creams to your incision. If you clothing is irritating, you may cover your incision with a dry gauze pad.  Diet  Resume your normal diet. There are no special food restrictions following this procedure. A low fat/low cholesterol diet is recommended for all patients with vascular disease. In order to heal from your surgery, it is CRITICAL to get adequate nutrition. Your body requires vitamins, minerals, and protein.  Vegetables are the best source of vitamins and minerals. Vegetables also provide the perfect balance of protein. Processed food has little nutritional value, so try to avoid this.  Medications  Resume taking all of your medications unless your doctor or nurse practitioner tells you not to. If your incision is causing pain, you may take over-the-counter pain relievers such as acetaminophen (Tylenol). If you were prescribed a stronger pain medication, please be aware these medications can cause nausea and constipation. Prevent nausea by taking the medication with a snack or meal. Avoid constipation by drinking plenty of fluids and eating foods with a high amount of fiber, such as fruits, vegetables, and grains.  Do not take Tylenol if you are taking prescription pain medications.   Follow up  Our office will schedule a follow-up appointment with a CT scan 3-4 weeks after your surgery.  Please call us immediately for any of the following conditions  Severe or worsening pain in your legs or feet or in your abdomen back or chest. Increased pain, redness, drainage (pus) from your incision site. Increased abdominal pain, bloating, nausea, vomiting or persistent diarrhea. Fever of 101 degrees or higher. Swelling in your leg (s),  Reduce your risk of vascular disease  Stop smoking. If you would like help call [facility] at 1-800-QUIT-NOW (1-800-784-8669) or [place] at [phone]. Manage your cholesterol Maintain a desired weight Control your diabetes Keep your blood pressure down  If you have questions, please call the office at [phone].  

## 2020-06-21 NOTE — Progress Notes (Signed)
Pt arrived from unit from PACU. VSS.Will continue to monitor. Pt. Oriented to unit . CHG bath given, Bilateral groins level 0 with skin glue  Phoebe Sharps, RN

## 2020-06-21 NOTE — Anesthesia Procedure Notes (Signed)
Procedure Name: Intubation Date/Time: 06/21/2020 9:55 AM Performed by: Hoy Morn, CRNA Pre-anesthesia Checklist: Patient identified, Emergency Drugs available, Suction available and Patient being monitored Patient Re-evaluated:Patient Re-evaluated prior to induction Oxygen Delivery Method: Circle system utilized Preoxygenation: Pre-oxygenation with 100% oxygen Induction Type: IV induction Ventilation: Mask ventilation without difficulty Laryngoscope Size: Miller and 2 Grade View: Grade I Tube type: Oral Number of attempts: 1 Airway Equipment and Method: Stylet and Oral airway Placement Confirmation: ETT inserted through vocal cords under direct vision,  positive ETCO2 and breath sounds checked- equal and bilateral Secured at: 22 cm Tube secured with: Tape Dental Injury: Teeth and Oropharynx as per pre-operative assessment

## 2020-06-21 NOTE — Anesthesia Procedure Notes (Signed)
Arterial Line Insertion Start/End3/21/2022 9:10 AM, 06/21/2020 9:15 AM Performed by: Roderic Palau, MD, Hoy Morn, CRNA, CRNA  Patient location: Pre-op. Preanesthetic checklist: patient identified, IV checked, site marked, risks and benefits discussed, surgical consent, monitors and equipment checked, pre-op evaluation, timeout performed and anesthesia consent Lidocaine 1% used for infiltration Right, radial was placed Catheter size: 20 G Hand hygiene performed  and maximum sterile barriers used  Allen's test indicative of satisfactory collateral circulation Attempts: 1 Procedure performed without using ultrasound guided technique. Following insertion, Biopatch and dressing applied. Post procedure assessment: normal  Patient tolerated the procedure well with no immediate complications.

## 2020-06-22 ENCOUNTER — Encounter (HOSPITAL_COMMUNITY): Payer: Self-pay | Admitting: Vascular Surgery

## 2020-06-22 LAB — BASIC METABOLIC PANEL WITH GFR
Anion gap: 7 (ref 5–15)
BUN: 13 mg/dL (ref 8–23)
CO2: 23 mmol/L (ref 22–32)
Calcium: 8.8 mg/dL — ABNORMAL LOW (ref 8.9–10.3)
Chloride: 107 mmol/L (ref 98–111)
Creatinine, Ser: 0.82 mg/dL (ref 0.61–1.24)
GFR, Estimated: >60 mL/min
Glucose, Bld: 96 mg/dL (ref 70–99)
Potassium: 3.9 mmol/L (ref 3.5–5.1)
Sodium: 137 mmol/L (ref 135–145)

## 2020-06-22 LAB — CBC
HCT: 37.4 % — ABNORMAL LOW (ref 39.0–52.0)
Hemoglobin: 12.7 g/dL — ABNORMAL LOW (ref 13.0–17.0)
MCH: 30.2 pg (ref 26.0–34.0)
MCHC: 34 g/dL (ref 30.0–36.0)
MCV: 89 fL (ref 80.0–100.0)
Platelets: 227 K/uL (ref 150–400)
RBC: 4.2 MIL/uL — ABNORMAL LOW (ref 4.22–5.81)
RDW: 13.6 % (ref 11.5–15.5)
WBC: 10.6 K/uL — ABNORMAL HIGH (ref 4.0–10.5)
nRBC: 0 % (ref 0.0–0.2)

## 2020-06-22 LAB — POCT ACTIVATED CLOTTING TIME
Activated Clotting Time: 225 seconds
Activated Clotting Time: 261 s

## 2020-06-22 MED ORDER — HYDROCODONE-ACETAMINOPHEN 5-325 MG PO TABS: 1.0000 | ORAL_TABLET | Freq: Four times a day (QID) | ORAL | 0 refills | Status: DC | PRN

## 2020-06-22 MED ORDER — ASPIRIN 81 MG PO TBEC: 81.0000 mg | DELAYED_RELEASE_TABLET | Freq: Every day | ORAL | Status: DC

## 2020-06-22 NOTE — Progress Notes (Addendum)
  Progress Note    06/22/2020 7:36 AM 1 Day Post-Op  Subjective:  Says he is a little sore in his groins.  Afebrile HR 70's-90's NSR 40'C-144'Y systolic 18% RA  Vitals:   06/22/20 0316 06/22/20 0401  BP: 115/72 108/62  Pulse: 88 80  Resp: 20 17  Temp: 97.8 F (36.6 C) 98 F (36.7 C)  SpO2: 93% 93%    Physical Exam: Cardiac:  regular Lungs:  Non labored Incisions:  Bilateral incisions look good Extremities:  Easily palpable pedal pulses.  Abdomen:  soft  CBC    Component Value Date/Time   WBC 10.6 (H) 06/22/2020 0530   RBC 4.20 (L) 06/22/2020 0530   HGB 12.7 (L) 06/22/2020 0530   HGB 15.1 05/02/2019 1553   HCT 37.4 (L) 06/22/2020 0530   HCT 45.1 05/02/2019 1553   PLT 227 06/22/2020 0530   PLT 262 05/02/2019 1553   MCV 89.0 06/22/2020 0530   MCV 90 05/02/2019 1553   MCH 30.2 06/22/2020 0530   MCHC 34.0 06/22/2020 0530   RDW 13.6 06/22/2020 0530   RDW 12.3 05/02/2019 1553   LYMPHSABS 2.6 05/02/2019 1553   MONOABS 0.7 01/09/2014 0525   EOSABS 0.3 05/02/2019 1553   BASOSABS 0.1 05/02/2019 1553    BMET    Component Value Date/Time   NA 137 06/22/2020 0530   NA 136 05/02/2019 1553   K 3.9 06/22/2020 0530   CL 107 06/22/2020 0530   CO2 23 06/22/2020 0530   GLUCOSE 96 06/22/2020 0530   BUN 13 06/22/2020 0530   BUN 14 05/02/2019 1553   CREATININE 0.82 06/22/2020 0530   CALCIUM 8.8 (L) 06/22/2020 0530   GFRNONAA >60 06/22/2020 0530   GFRAA 96 05/02/2019 1553    INR    Component Value Date/Time   INR 1.1 06/21/2020 1300     Intake/Output Summary (Last 24 hours) at 06/22/2020 0736 Last data filed at 06/22/2020 0555 Gross per 24 hour  Intake 1050 ml  Output 1725 ml  Net -675 ml     Assessment:  80 y.o. male is s/p:  1.  Ultrasound-guided access of bilateral common femoral arteries with percutaneous closure for delivery of endograft 2.  Aortogram with catheter selection of aorta 3.  Endovascular repair of infrarenal abdominal aortic aneurysm  using aortobi-iliac stent graft 4.  Open exposure of bilateral common femoral arteries for repair of both the right and left common femoral artery  1 Day Post-Op  Plan: -pt doing well this morning with easily palpable bilateral pedal pulses -bilateral groins are clean and dry -has not walked and needs to mobilize -foley removed just a little bit ago-needs to void -anticipate discharge later today if able to void -asa started post op -DVT prophylaxis:  Sq heparin to start later today   Leontine Locket, PA-C [facility] [phone] 06/22/2020 7:36 AM  I have seen and evaluated the patient. I agree with the PA note as documented above.  Postop day 1 status post EVAR with aortobiiliac stent graft for 5.9 cm abdominal aortic aneurysm.  He did require small groin cutdowns to repair bilateral common femoral arteries after Perclose failure.  Groins look good.  Palpable pedal pulses.  Labs stable.  Plan is DC Foley and assuming he can void and ambulate discharge home today follow-up in 1 month with CTA abdomen pelvis.  He will get aspirin statin at discharge.  Marty Heck, MD [facility] Office: [phone]

## 2020-06-22 NOTE — Plan of Care (Signed)

## 2020-06-22 NOTE — Discharge Summary (Signed)
[facility] Discharge Summary   Connor Cuevas 1940/11/23 80 y.o. male  MRN: [id number]  Admission Date: 06/21/2020  Discharge Date: 06/22/2020  Physician: Marty Heck, MD  Admission Diagnosis: AAA (abdominal aortic aneurysm) [facility]) [I71.4]   HPI:   This is a 80 y.o. male with history of coronary artery disease status post PCI, HLD,and COPD that presentsto discuss CTA results for enlarging AAA. Patient has a known AAA since 2014. I last saw him in 2020 and it measured 4.4 cm. He has had several duplexes since I last saw him and it measured 4.7 cm in January 2021 and most recently enlarged to 5.3 cm in January 2022 at his cardiologist office. He was sent back to see me for enlarging AAA on duplex.  CTA was further obtained3/01/2021 and this showed enlarging infrarenal aneurysm measuring up to 5.7 cm per the radiology read. Patient denies any history of abdominal surgery other than a right inguinal hernia repair. He is on Plavix for his coronary stent. No new abdominal pain at this time. He wants to move forward with getting his aneurysm repaired.  Hospital Course:  The patient was admitted to the hospital and taken to the operating room on 06/21/2020 and underwent: 1.  Ultrasound-guided access of bilateral common femoral arteries with percutaneous closure for delivery of endograft 2.  Aortogram with catheter selection of aorta 3.  Endovascular repair of infrarenal abdominal aortic aneurysm using aortobi-iliac stent graft 4.  Open exposure of bilateral common femoral arteries for repair of both the right and left common femoral artery    Findings: Ultrasound-guided percutaneous access of bilateral common femoral arteries with attempted percutaneous closure.  Ultimately 12 French sheath was placed in the right common femoral artery and a 18 French sheath in the left common femoral artery.  Successful treatment of infrarenal abdominal aortic aneurysm using a 32 mm x 14 mm x  14 cm main body left with a 14 mm x 10 cm bell bottom into left common iliac artery and also a 14 mm x 12 cm bell bottom into the right common iliac artery.  Excellent seal with no evidence of endoleak and appropriate exclusion of aneurysm.  Ultimately had to cut down on both common femoral arteries for open repair after percutaneous closure failed.  Brisk Doppler signals at completion in both feet.  The pt tolerated the procedure well and was transported to the PACU in good condition.   By POD 1, pt was doing well with palpable pedal pulses, he was able to void and ambulate.  He was tolerating food.  He had been started on a baby aspirin.  Bilateral groin incisions are clean and dry.  He is discharged home.    CBC    Component Value Date/Time   WBC 10.6 (H) 06/22/2020 0530   RBC 4.20 (L) 06/22/2020 0530   HGB 12.7 (L) 06/22/2020 0530   HGB 15.1 05/02/2019 1553   HCT 37.4 (L) 06/22/2020 0530   HCT 45.1 05/02/2019 1553   PLT 227 06/22/2020 0530   PLT 262 05/02/2019 1553   MCV 89.0 06/22/2020 0530   MCV 90 05/02/2019 1553   MCH 30.2 06/22/2020 0530   MCHC 34.0 06/22/2020 0530   RDW 13.6 06/22/2020 0530   RDW 12.3 05/02/2019 1553   LYMPHSABS 2.6 05/02/2019 1553   MONOABS 0.7 01/09/2014 0525   EOSABS 0.3 05/02/2019 1553   BASOSABS 0.1 05/02/2019 1553    BMET    Component Value Date/Time   NA 137 06/22/2020 0530  NA 136 05/02/2019 1553   K 3.9 06/22/2020 0530   CL 107 06/22/2020 0530   CO2 23 06/22/2020 0530   GLUCOSE 96 06/22/2020 0530   BUN 13 06/22/2020 0530   BUN 14 05/02/2019 1553   CREATININE 0.82 06/22/2020 0530   CALCIUM 8.8 (L) 06/22/2020 0530   GFRNONAA >60 06/22/2020 0530   GFRAA 96 05/02/2019 1553       Discharge Instructions    Discharge patient   Complete by: As directed    Discharge disposition: 01-Home or Self Care   Discharge patient date: 06/22/2020      Discharge Diagnosis:  AAA (abdominal aortic aneurysm) [facility]) [I71.4]  Secondary  Diagnosis: Patient Active Problem List   Diagnosis Date Noted  . Unstable angina ([place]) 01/10/2014  . Hyperlipidemia   . CAD (coronary artery disease)   . COPD (chronic obstructive pulmonary disease) ([place])   . AAA (abdominal aortic aneurysm) ([place])   . 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 Tobacco abuse   . 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 Pulmonary nodule 09/28/2008  . ANXIETY 09/25/2008  . Aortic valve disorder 09/25/2008   Past Medical History:  Diagnosis Date  . AAA (abdominal aortic aneurysm) ([place]) 04/16/2018   4.3 cm, noted on [place]   . Acute kidney failure ([place]) 2005 approx   after ureteroscopy, hospitalized for about 10 days afterwards  . Anxiety   . Arthritis    Hands, wrist, elbow, back and spine  . Bulging lumbar disc    L4-L5 degeneration, Chronic back pain  . CAD (coronary artery disease)    a. 01/09/14: [place] s/p DES to RCA   . COPD (chronic obstructive pulmonary disease) ([facility])    wheezing  . Dyspnea    with exertion  . Glaucoma of both eyes   . Hepatic cyst 2013   Several tiny hepatic cysts , noted on CT  . History of bilateral inguinal hernias   . History of kidney stones    small kidney stones in right kidney  . Hyperlipidemia   . Interstitial lung disease ([place])   . Leg pain    ABIs (10/15):  R 1.3, L 1.2, normal  . Orthopnea    uses 2 pillows  . Pneumonia   . Pulmonary nodule, right 08/02/2016   a. Right upper lobe  . Right ureteral stone   . Seasonal allergies   . 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  . 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 Aortosclerosis, bilateral - mild with calcification. Per Dr. Moreen Fowler at [facility]   . Tobacco abuse   . Wears dentures    upper   . Wears glasses      Allergies as of 06/22/2020      Reactions   Cefaclor Anaphylaxis, Rash, Swelling   Throat closed   Nitrofurantoin Anaphylaxis   Right kidney shut down    Chantix [varenicline] Other (See Comments)   nightmares   Nsaids Other  (See Comments)      Medication List    TAKE these medications   aspirin 81 MG EC tablet Take 1 tablet (81 mg total) by mouth daily at 6 (six) AM. Swallow whole. Start taking on: June 23, 2020   atenolol 25 MG tablet Commonly known as: TENORMIN Take 1 tablet (25 mg total) by mouth daily.   atorvastatin 80 MG tablet Commonly known as: LIPITOR Take 1 tablet (80 mg total) by mouth every morning.  brimonidine 0.1 % Soln Commonly known as: ALPHAGAN P Place 1 drop into both eyes 2 (two) times daily.   clopidogrel 75 MG tablet Commonly known as: PLAVIX Take 1 tablet by mouth once daily   Co Q 10 100 MG Caps Take 100 mg by mouth daily.   dorzolamide-timolol 22.3-6.8 MG/ML ophthalmic solution Commonly known as: COSOPT Place 1 drop into both eyes 2 (two) times daily.   gabapentin 100 MG capsule Commonly known as: NEURONTIN Take 1 capsule (100 mg total) by mouth 3 (three) times daily.   HYDROcodone-acetaminophen 5-325 MG tablet Commonly known as: NORCO/VICODIN Take 1 tablet by mouth every 6 (six) hours as needed for moderate pain. What changed:   how much to take  additional instructions   ICAPS AREDS FORMULA PO Take 2 tablets by mouth daily.   latanoprost 0.005 % ophthalmic solution Commonly known as: XALATAN Place 1 drop into both eyes at bedtime.   methocarbamol 500 MG tablet Commonly known as: ROBAXIN TAKE 1 TABLET BY MOUTH EVERY 8 HOURS AS NEEDED   nitroGLYCERIN 0.4 MG SL tablet Commonly known as: NITROSTAT Place 1 tablet (0.4 mg total) under the tongue every 5 (five) minutes as needed for chest pain.   OMEGA-3 KRILL OIL PO Take 200 mg by mouth daily.   ProAir HFA 108 (90 Base) MCG/ACT inhaler Generic drug: albuterol Inhale 2 puffs into the lungs every 4 (four) hours as needed for wheezing.   pseudoephedrine 120 MG 12 hr tablet Commonly known as: SUDAFED Take 120 mg by mouth daily as needed for congestion.   tiotropium 18 MCG inhalation  capsule Commonly known as: SPIRIVA Place 18 mcg into inhaler and inhale every morning.   traZODone 100 MG tablet Commonly known as: DESYREL Take 100 mg by mouth at bedtime.       Discharge Instructions:  [facility]  Discharge Instructions Endovascular Aortic Aneurysm Repair  Please refer to the following instructions for your post-procedure care. Your surgeon or Physician Assistant will discuss any changes with you.  Activity  You are encouraged to walk as much as you can. You can slowly return to normal activities but must avoid strenuous activity and heavy lifting until your doctor tells you it's OK. Avoid activities such as vacuuming or swinging a gold club. It is normal to feel tired for several weeks after your surgery. Do not drive until your doctor gives the OK and you are no longer taking prescription pain medications. It is also normal to have difficulty with sleep habits, eating, and bowel movements after surgery. These will go away with time.  Bathing/Showering  You may shower after you go home. If you have an incision, do not soak in a bathtub, hot tub, or swim until the incision heals completely.  Incision Care  Shower every day. Clean your incision with mild soap and water. Pat the area dry with a clean towel. You do not need a bandage unless otherwise instructed. Do not apply any ointments or creams to your incision. If you clothing is irritating, you may cover your incision with a dry gauze pad.  Diet  Resume your normal diet. There are no special food restrictions following this procedure. A low fat/low cholesterol diet is recommended for all patients with vascular disease. In order to heal from your surgery, it is CRITICAL to get adequate nutrition. Your body requires vitamins, minerals, and protein. Vegetables are the best source of vitamins and minerals. Vegetables also provide the perfect balance of  protein. Processed food has  little nutritional value, so try to avoid this.  Medications  Resume taking all of your medications unless your doctor or Physician Assistnat tells you not to. If your incision is causing pain, you may take over-the-counter pain relievers such as acetaminophen (Tylenol). If you were prescribed a stronger pain medication, please be aware these medications can cause nausea and constipation. Prevent nausea by taking the medication with a snack or meal. Avoid constipation by drinking plenty of fluids and eating foods with a high amount of fiber, such as fruits, vegetables, and grains.  Do not take Tylenol if you are taking prescription pain medications.   Follow up  [place] office will schedule a follow-up appointment with a C.T. scan 3-4 weeks after your surgery.  Please call us immediately for any of the following conditions  . Severe or worsening pain in your legs or feet or in your abdomen back or chest. . Increased pain, redness, drainage (pus) from your incision sit. . Increased abdominal pain, bloating, nausea, vomiting or persistent diarrhea. . Fever of 101 degrees or higher. . Swelling in your leg (s), .  Reduce your risk of vascular disease  .Stop smoking. If you would like help call [facility] at 1-800-QUIT-NOW 8327287436) or [place] at 6101110798. .Manage your cholesterol .Maintain a desired weight .Control your diabetes .Keep your blood pressure down  If you have questions, please call the office at [phone].   Prescriptions given: 1.  Norco 5/325 #12 No Refill 2.  Aspirin 81mg  daily  Disposition: home  Patient's condition: is Good  Follow up: 1. Dr. Carlis Abbott in 4 weeks with CTA protocol   Leontine Locket, PA-C [facility] [phone] 06/22/2020  11:56 AM   - For VQI Registry use - Post-op:  Time to Extubation: [x]  In OR, [ ]  < 12 hrs, [ ]  12-24 hrs, [ ]  >=24 hrs Vasopressors Req. Post-op: No MI: No., [ ]  Troponin only, [ ]  EKG  or Clinical New Arrhythmia: No CHF: No ICU Stay: 1 day in progressive Transfusion: No     If yes, n/a units given  Complications: Resp failure: No., [ ]  Pneumonia, [ ]  Ventilator Chg in renal function: No., [ ]  Inc. Cr > 0.5, [ ]  Temp. Dialysis,  [ ]  Permanent dialysis Leg ischemia: No., no Surgery needed, [ ]  Yes, Surgery needed,  [ ]  Amputation Bowel ischemia: No., [ ]  Medical Rx, [ ]  Surgical Rx Wound complication: No., [ ]  Superficial separation/infection, [ ]  Return to OR Return to OR: No  Return to OR for bleeding: No Stroke: No., [ ]  Minor, [ ]  Major  Discharge medications: Statin use:  Yes  ASA use:  Yes  Plavix use:  Yes  Beta blocker use:  Yes  ARB use:  No ACEI use:  No CCB use:  No

## 2020-06-23 ENCOUNTER — Other Ambulatory Visit: Payer: Self-pay

## 2020-06-23 DIAGNOSIS — I714 Abdominal aortic aneurysm, without rupture, unspecified: Secondary | ICD-10-CM

## 2020-07-02 ENCOUNTER — Other Ambulatory Visit: Payer: Self-pay | Admitting: Physician Assistant

## 2020-07-02 ENCOUNTER — Telehealth: Payer: Self-pay

## 2020-07-02 DIAGNOSIS — I714 Abdominal aortic aneurysm, without rupture, unspecified: Secondary | ICD-10-CM

## 2020-07-02 MED ORDER — HYDROCODONE-ACETAMINOPHEN 5-325 MG PO TABS
1.0000 | ORAL_TABLET | Freq: Four times a day (QID) | ORAL | 0 refills | Status: DC | PRN
Start: 2020-07-02 — End: 2020-08-13

## 2020-07-02 NOTE — Telephone Encounter (Signed)
Pt called with c/o pain from R flank/kidney where mass was found. He is seeing urologist next week. APP has reordered his pain medication and he will let urologist know of this pain/medication. Pt verbalized understanding; no further questions/concerns at this time.

## 2020-07-07 ENCOUNTER — Telehealth: Payer: Self-pay | Admitting: Cardiovascular Disease

## 2020-07-07 DIAGNOSIS — D414 Neoplasm of uncertain behavior of bladder: Secondary | ICD-10-CM | POA: Diagnosis not present

## 2020-07-07 DIAGNOSIS — Z125 Encounter for screening for malignant neoplasm of prostate: Secondary | ICD-10-CM | POA: Diagnosis not present

## 2020-07-07 DIAGNOSIS — D49511 Neoplasm of unspecified behavior of right kidney: Secondary | ICD-10-CM | POA: Diagnosis not present

## 2020-07-07 DIAGNOSIS — N281 Cyst of kidney, acquired: Secondary | ICD-10-CM | POA: Diagnosis not present

## 2020-07-07 NOTE — Telephone Encounter (Signed)
[place] is calling stating [facility] will be contacting the office requesting surgical clearance. He states they advised him to call and give a heads up. Please advise.

## 2020-07-08 NOTE — Telephone Encounter (Signed)
I have not seen any preop clearance coming through from the urologist office

## 2020-07-08 NOTE — Telephone Encounter (Signed)
Will forward this to our pre op team

## 2020-07-09 NOTE — Telephone Encounter (Signed)
Please check with the patient to obtain more information regarding potential surgery and contact the surgeon's office.  I have not seen any preoperative clearance on this patient coming through.   I will remove this message from preop pool.

## 2020-07-09 NOTE — Telephone Encounter (Signed)
Called and left a voice message for the patient asking if he could give office a call back with any information even if it the name of the doctor's office and number.

## 2020-07-12 ENCOUNTER — Other Ambulatory Visit: Payer: Self-pay | Admitting: Urology

## 2020-07-12 ENCOUNTER — Other Ambulatory Visit (HOSPITAL_COMMUNITY): Payer: Self-pay | Admitting: Urology

## 2020-07-12 DIAGNOSIS — D49511 Neoplasm of unspecified behavior of right kidney: Secondary | ICD-10-CM

## 2020-07-13 ENCOUNTER — Telehealth: Payer: Self-pay | Admitting: Cardiovascular Disease

## 2020-07-13 NOTE — Telephone Encounter (Signed)
[facility] and left message with nurse to have surgery scheduler fax over a clearance request for this pt or surgery scheduler can call me at [phone] with the information.

## 2020-07-13 NOTE — Telephone Encounter (Signed)
Clearance request has been entered.

## 2020-07-13 NOTE — Telephone Encounter (Signed)
[place] with [facility] called back. She assured me she will fas over a clearance request to our office today to [phone]. Once clearance request received, will input for pre op team.

## 2020-07-13 NOTE — Telephone Encounter (Signed)
   [facility] Pre-operative Risk Assessment    Patient Name: Connor Cuevas  DOB: 12/05/1940  MRN: [id number]   [place]: - Please ensure there is not already an duplicate clearance open for this procedure. - Under Visit Info/Reason for Call, type in Other and utilize the format Clearance MM/DD/YY or Clearance TBD. Do not use dashes or single digits. - If request is for dental extraction, please clarify the # of teeth to be extracted.  Request for surgical clearance:  1. What type of surgery is being performed? Transurethral resection of bladder tumor   2. When is this surgery scheduled? TBD pending clearance   3. What type of clearance is required (medical clearance vs. Pharmacy clearance to hold med vs. Both)? Both  4. Are there any medications that need to be held prior to surgery and how long? Plavix 5 days prior   5. Practice name and name of physician performing surgery? Dr. Rexene Alberts, [facility]   6. What is the office phone number? [phone]   7.   What is the office fax number? [phone]  8.   Anesthesia type (None, local, MAC, general) ? General with paralysis    Johnna Acosta 07/13/2020, 10:55 AM  _________________________________________________________________   (provider comments below)

## 2020-07-14 NOTE — Telephone Encounter (Signed)
Dr. [name] Pt has hx of DES to mid RCA 2015 with nonischemic stress test 06/2019. He remains on plavix. Recent endovascular repair of infrarenal AAA for which ASA was added at discharge. We are asked to hold plavix for 5 days for upcoming transurethral resection of bladder tumor. From your perspective, OK to hold plavix for 5 days?  I will also let the requesting office know that they should reach out to [facility] as well.  Thank you Angie

## 2020-07-15 NOTE — Telephone Encounter (Signed)
   Name: Connor Cuevas  DOB: 1940/09/30  MRN: [id number]   Primary Cardiologist: [name], MD  Chart reviewed as part of pre-operative protocol coverage. Patient was contacted 07/15/2020 in reference to pre-operative risk assessment for pending surgery as outlined below.  Connor Cuevas was last seen on 05/04/20 by Dr. [name].  Since that day, Connor Cuevas has done well from a cardiac perspective, denies anginal symptoms. Per Dr. [name], [place] to hold Plavix 5 days prior to procedure. METS>4.   Therefore, based on ACC/AHA guidelines, the patient would be at acceptable risk for the planned procedure without further cardiovascular testing.   The patient was advised that if he develops new symptoms prior to surgery to contact our office to arrange for a follow-up visit, and he verbalized understanding.  I will route this recommendation to the requesting party via Epic fax function and remove from pre-op pool. Please call with questions.  Shadee Montoya Ninfa Meeker, PA-C 07/15/2020, 11:30 AM

## 2020-07-19 ENCOUNTER — Telehealth: Payer: Self-pay

## 2020-07-19 NOTE — Telephone Encounter (Signed)
Spoke with patient's daughter Anderson Malta and scheduled an in-person Palliative Consult for 11:30AM  COVID screening was negative. No pets in home. Patient lives with wife.   Consent obtained; updated Outlook/Netsmart/Team List and Epic.  Family is aware they may be receiving a call from NP the day before or day of to confirm appointment.

## 2020-07-19 NOTE — Telephone Encounter (Signed)
Attempted to contact patient's daughter Jennifer to schedule a Palliative Care consult appointment. No answer left a message to return call.  

## 2020-07-21 ENCOUNTER — Telehealth: Payer: Self-pay | Admitting: Cardiovascular Disease

## 2020-07-21 ENCOUNTER — Ambulatory Visit (HOSPITAL_COMMUNITY)
Admission: RE | Admit: 2020-07-21 | Discharge: 2020-07-21 | Disposition: A | Discharge status: Home / Self Care | Payer: Medicare PPO | Source: Ambulatory Visit | Attending: Urology | Admitting: Urology

## 2020-07-21 ENCOUNTER — Other Ambulatory Visit: Payer: Self-pay

## 2020-07-21 DIAGNOSIS — D49511 Neoplasm of unspecified behavior of right kidney: Secondary | ICD-10-CM

## 2020-07-21 NOTE — Telephone Encounter (Signed)
   [facility] Pre-operative Risk Assessment    Request for surgical clearance:  1. What type of surgery is being performed? Transurethral resection of a bladder tumor  2. When is this surgery scheduled? Pending Clearance  3. What type of clearance is required (medical clearance vs. Pharmacy clearance to hold med vs. Both)? Both   4. Are there any medications that need to be held prior to surgery and how long?  aspirin EC 81 MG EC tablet clopidogrel (PLAVIX) 75 MG tablet Need to be hold 5 days prior  5. Practice name and name of physician performing surgery? [facility]        Dr.Matthew KMM  3. What is the office phone number?  [phone]   7.   What is the office fax number?  [phone]  8.   Anesthesia type (None, local, MAC, general) ?  General   Marlowe Kays from [facility] is requesting another surgical clearance for this pt.She was not aware that he is on aspirin and Plavix.   Lars Pinks 07/21/2020, 3:31 PM  _________________________________________________________________   (provider comments below)

## 2020-07-21 NOTE — Telephone Encounter (Signed)
This is a duplicate request.  I have resent previous cardiac clearance request back to the surgeon's office. Callback pool to contact the requesting provider tomorrow to make sure they have received it.

## 2020-07-22 ENCOUNTER — Ambulatory Visit
Admission: RE | Admit: 2020-07-22 | Discharge: 2020-07-22 | Disposition: A | Discharge status: Home / Self Care | Payer: Medicare PPO | Source: Ambulatory Visit | Attending: Vascular Surgery | Admitting: Vascular Surgery

## 2020-07-22 DIAGNOSIS — I714 Abdominal aortic aneurysm, without rupture, unspecified: Secondary | ICD-10-CM

## 2020-07-22 DIAGNOSIS — N200 Calculus of kidney: Secondary | ICD-10-CM | POA: Diagnosis not present

## 2020-07-22 DIAGNOSIS — R454 Irritability and anger: Secondary | ICD-10-CM | POA: Diagnosis not present

## 2020-07-22 DIAGNOSIS — N2889 Other specified disorders of kidney and ureter: Secondary | ICD-10-CM | POA: Diagnosis not present

## 2020-07-22 DIAGNOSIS — Z01818 Encounter for other preprocedural examination: Secondary | ICD-10-CM | POA: Diagnosis not present

## 2020-07-22 DIAGNOSIS — N3289 Other specified disorders of bladder: Secondary | ICD-10-CM | POA: Diagnosis not present

## 2020-07-22 DIAGNOSIS — I7772 Dissection of iliac artery: Secondary | ICD-10-CM | POA: Diagnosis not present

## 2020-07-22 DIAGNOSIS — K639 Disease of intestine, unspecified: Secondary | ICD-10-CM | POA: Diagnosis not present

## 2020-07-22 DIAGNOSIS — M545 Low back pain, unspecified: Secondary | ICD-10-CM | POA: Diagnosis not present

## 2020-07-22 IMAGING — CT CT CTA ABD/PEL W/CM AND/OR W/O CM
2 of 10 series · 12 of 46 positions shown, 15 images · IV contrast (iopamidol)
Comparison: [DATE]

CLINICAL DATA: Aortic aneurysm, post  stent graft placement.

EXAM:
CTA ABDOMEN AND PELVIS WITHOUT AND WITH CONTRAST
TECHNIQUE: Multidetector CT imaging of the abdomen and pelvis was performed
using the standard protocol during bolus administration of
intravenous contrast. Multiplanar reconstructed images and MIPs were
obtained and reviewed to evaluate the vascular anatomy.
CONTRAST:  75mL [[place]] IOPAMIDOL ([KY]) INJECTION 76%

[Series 8: cta arterial 2.00 bv36 s3 cor art st · axial · arterial · 0.61mm/px · z∈[+1230,+1580]mm · 10 of 214 slices shown, 13 images (1 of 2)]
[im 26/214  soft-tissue]
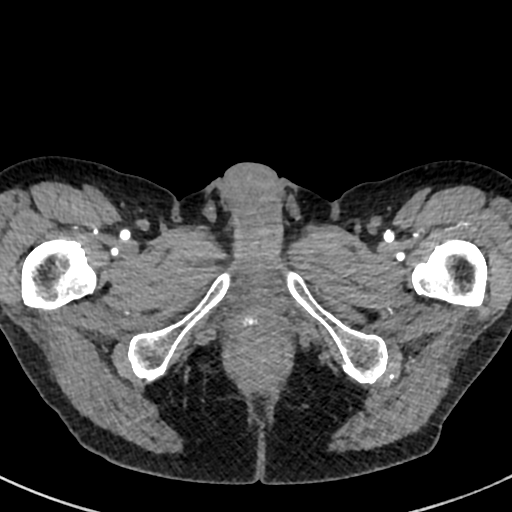
[im 26/214  bone]
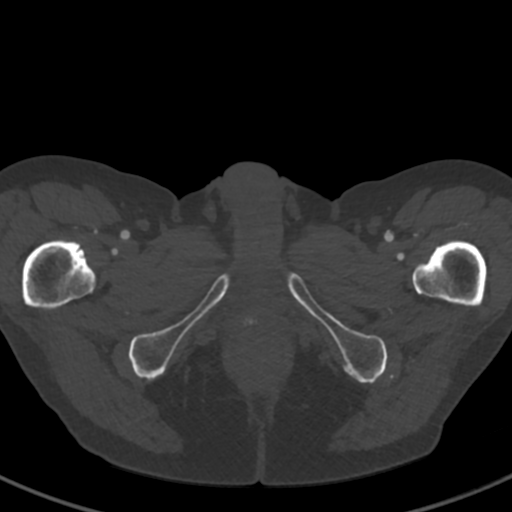
[im 51/214  soft-tissue]
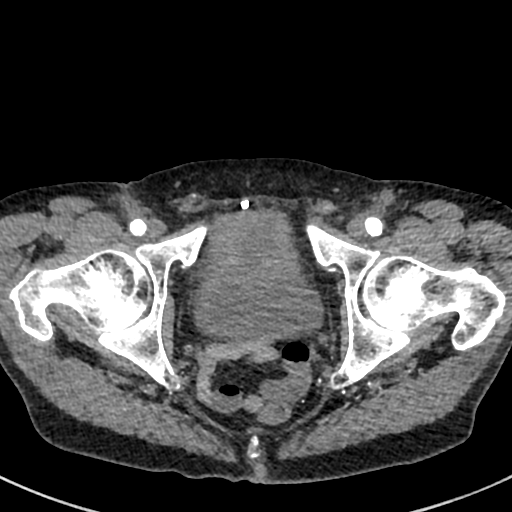
[im 76/214  soft-tissue]
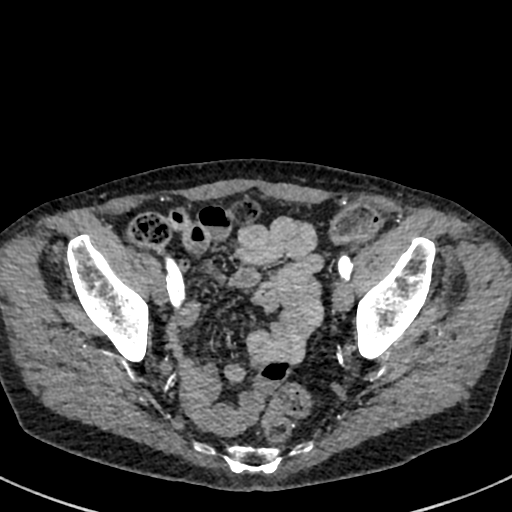
[im 101/214  soft-tissue]
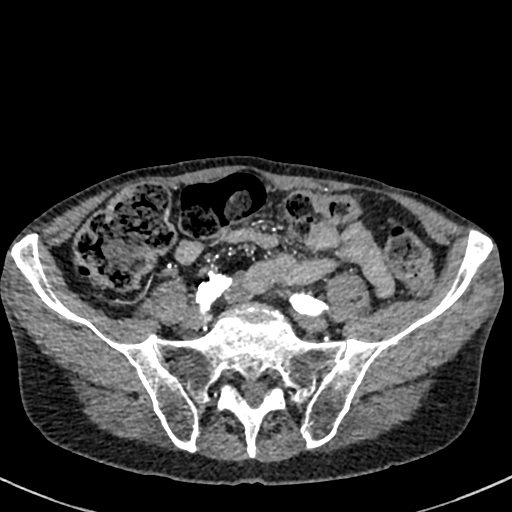
[im 126/214  soft-tissue]
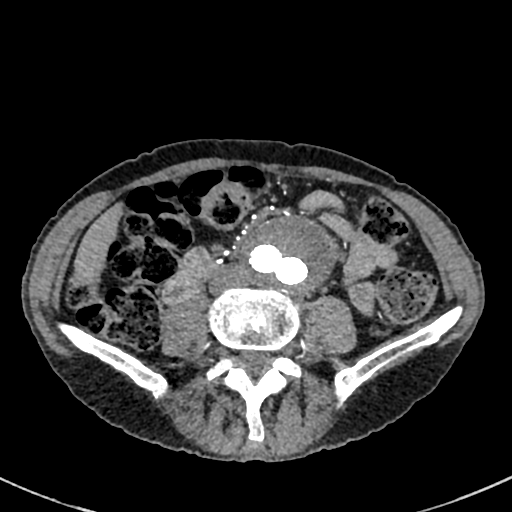
[im 151/214  soft-tissue]
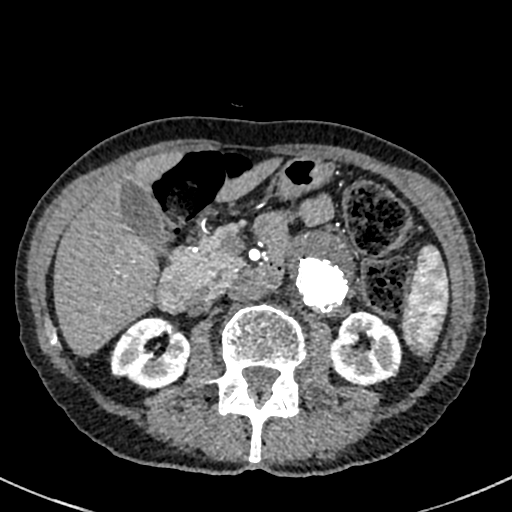
[im 163/214  lung]
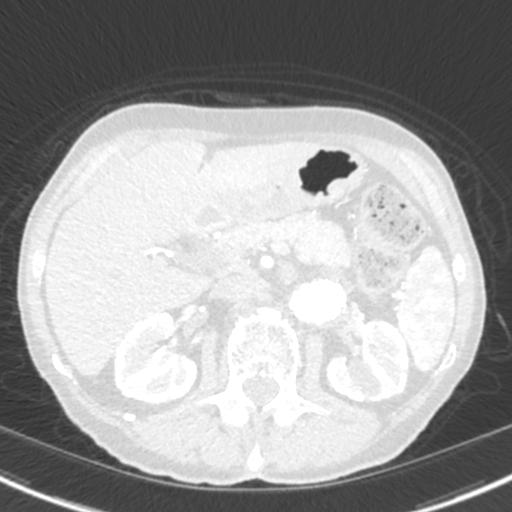
[im 176/214  soft-tissue]
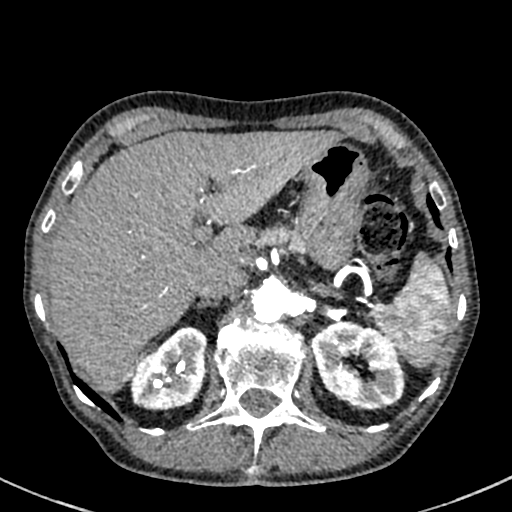
[im 176/214  lung]
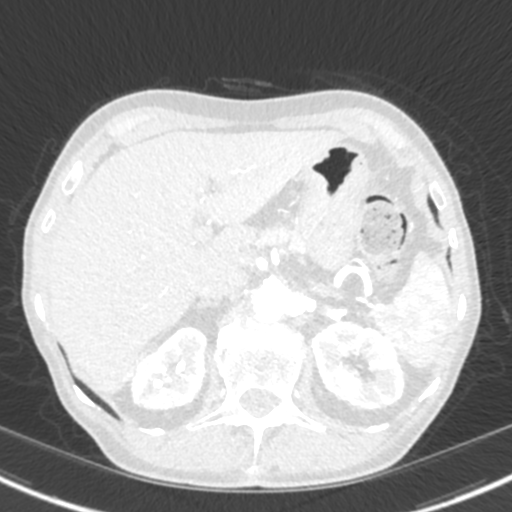
[im 188/214  lung]
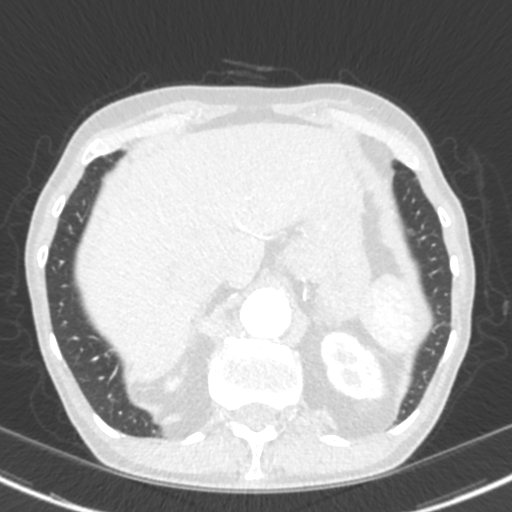
[im 201/214  soft-tissue]
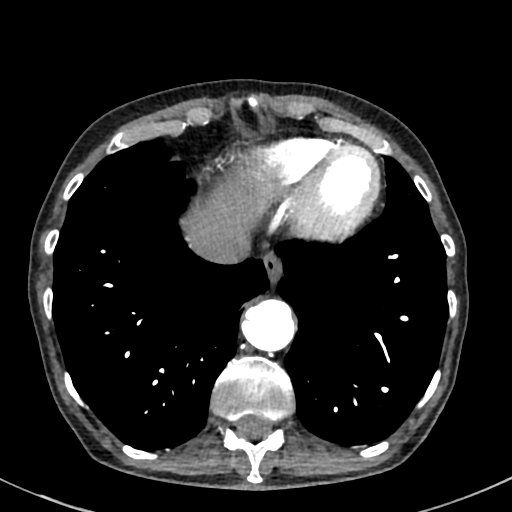
[im 201/214  lung]
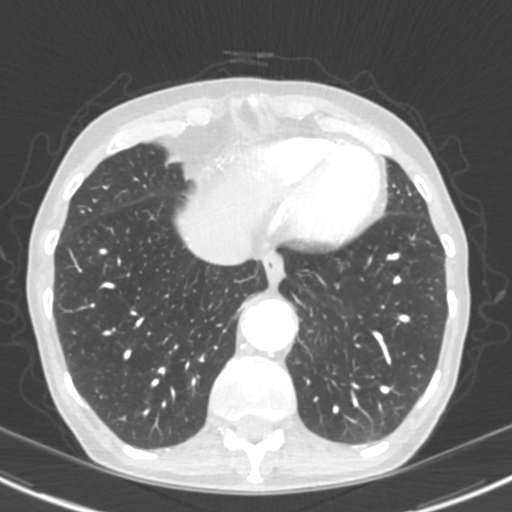

[Series 10: cta arterial 2.00 bv36 s3 cor art st · coronal · arterial · 0.61mm/px · 2 of 144 slices shown (2 of 2)]
[im 48/144  soft-tissue]
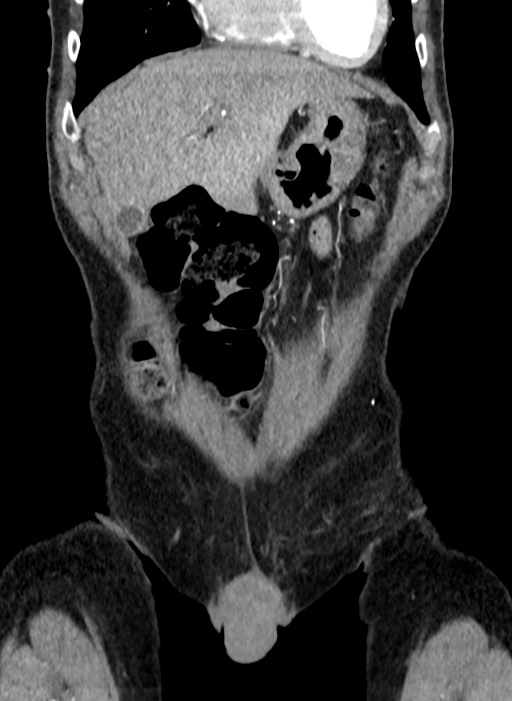
[im 96/144  soft-tissue]
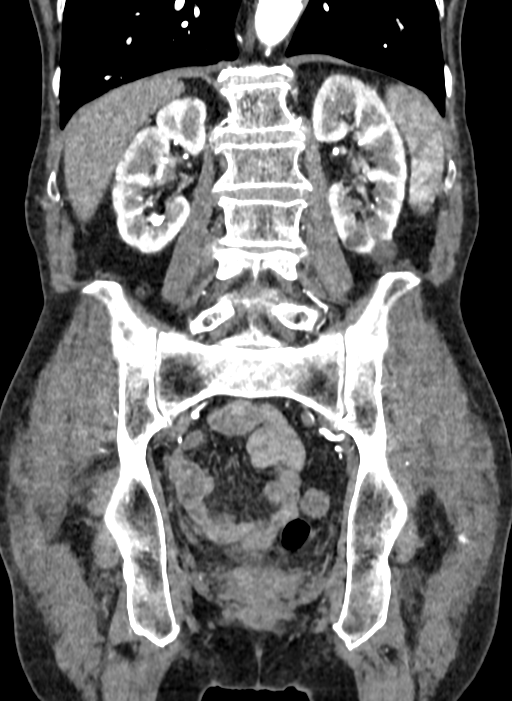

[12 of 46 positions shown; findings below may reference images not displayed]

FINDINGS: VASCULAR

Coronary calcifications.

Aorta: Interval placement of bifurcated infrarenal stent graft which
is patent. No endoleak. Native sac diameter 6.2 x 5.4 cm maximum
transverse dimensions (previously 6.1 x 5.7 by my measurement). No
evidence of leak or impending rupture.

Celiac: Patent without evidence of aneurysm, dissection, vasculitis
or significant stenosis.

SMA: Patent without evidence of aneurysm, dissection, vasculitis or
significant stenosis.

Renals: Single left with partially calcified ostial plaque over
length of approximately 1.9 cm, only mild stenosis, patent distally.
Duplicated right, superior dominant, both patent.

IMA: Origin occlusion.

Inflow: Both limbs of the stent graft are patent, extending into
distal common iliac arteries, well apposed. Scattered calcified
atheromatous plaque in the native external and internal iliac
arteries without stenosis. Chronic linear dissection flap in the mid
right external iliac artery which was present preop.

Proximal Outflow: Curvilinear short-segment dissection flap in the
proximal left common femoral artery, new since previous, of doubtful
hemodynamic significance. Right common femoral artery, and
visualized portions of bilateral superficial and profunda femoral
arteries are patent without evidence of aneurysm, dissection,
vasculitis or significant stenosis.

Veins: Patent hepatic veins, portal vein, SMV, splenic vein,
bilateral renal veins. No venous pathology evident.

Review of the MIP images confirms the above findings.

NON-VASCULAR

Lower chest: No pleural or pericardial effusion.

Hepatobiliary: No focal liver abnormality is seen. No gallstones,
gallbladder wall thickening, or biliary dilatation.

Pancreas: Unremarkable. No pancreatic ductal dilatation or
surrounding inflammatory changes.

Spleen: Normal in size without focal abnormality.

Adrenals/Urinary Tract: Adrenal glands unremarkable. Bilateral
nephrolithiasis, largest stone in the upper pole right kidney
cm. No hydronephrosis. Urinary bladder incompletely distended.

Stomach/Bowel: Stomach and small bowel decompressed. Normal
appendix. Moderate colonic fecal material without dilatation.

Lymphatic: No adenopathy identified.

Reproductive: Prostate enlargement, protruding into the lumen of the
urinary bladder, with some central calcifications.

Other: No ascites.  No free air.

Musculoskeletal: Laparoscopic left inguinal hernia repair sutures.
Chronic L4 superior endplate central compression deformity. No acute
fracture or worrisome bone lesion.
IMPRESSION: 1. Interval bifurcated infrarenal stent graft placement with no
endoleak, native sac diameter 6.2 cm (previously 6.1).
2. Stable linear dissection flap in the mid right external iliac
artery, of doubtful hemodynamic significance.
3. New curvilinear dissection flap in the proximal left common
femoral artery, of doubtful hemodynamic significance.
4. Bilateral nephrolithiasis without hydronephrosis.
5. Coronary calcifications. The severity of coronary artery disease
and any potential stenosis cannot be assessed on this non-gated CT
examination.

## 2020-07-22 MED ORDER — IOPAMIDOL (ISOVUE-370) INJECTION 76%
75.0000 mL | Freq: Once | INTRAVENOUS | Status: AC | PRN
  Administered 2020-07-22: 75 mL via INTRAVENOUS

## 2020-07-22 NOTE — Telephone Encounter (Signed)
Amended clearance notes have been re-faxed to Dr. Gwynneth Aliment office.

## 2020-07-22 NOTE — Telephone Encounter (Signed)
Left message for surgery scheduler clearance originally faxed 07/15/20 as well as yesterday 07/21/20. I will re-fax clearance again this morning. Left message if office still has yet to receive clearance to please call our office with alternat fax #.

## 2020-07-22 NOTE — Telephone Encounter (Signed)
Per urology service, patient will also need to hold aspirin prior to the procedure as well.  Patient may hold aspirin for 5 to 7 days and Plavix for 5 days prior to the procedure and restart as soon as possible afterward.

## 2020-07-26 ENCOUNTER — Other Ambulatory Visit: Payer: Self-pay | Admitting: Urology

## 2020-07-27 ENCOUNTER — Ambulatory Visit (INDEPENDENT_AMBULATORY_CARE_PROVIDER_SITE_OTHER): Payer: Medicare PPO | Admitting: Vascular Surgery

## 2020-07-27 ENCOUNTER — Other Ambulatory Visit: Payer: Self-pay

## 2020-07-27 ENCOUNTER — Encounter: Payer: Self-pay | Admitting: Vascular Surgery

## 2020-07-27 VITALS — BP 144/91 | HR 79 | Temp 97.7°F | Resp 16 | Ht 69.0 in | Wt 127.0 lb

## 2020-07-27 DIAGNOSIS — I714 Abdominal aortic aneurysm, without rupture, unspecified: Secondary | ICD-10-CM

## 2020-07-27 NOTE — Progress Notes (Signed)
Patient name: Connor Cuevas MRN: [id number] DOB: 05-24-1940 Sex: male  REASON FOR VISIT: Postop check after stent graft of abdominal aortic aneurysm  HPI: SHANTA DORVIL is a 80 y.o. male with multiple medical problems that presents for postop check after recent aortobiiliac stent graft for a 5.9 cm abdominal aortic aneurysm on 06/21/2020.  This was done with percutaneous access but did require bilateral common femoral cutdowns after percutaneous device closure failed.  On follow-up today reports no new issues.  His legs are doing well.  His groin incisions have healed.  He did get a CTA prior to the visit.  No abdominal or back pain.  Only other issue is he is getting work-up for bladder cancer with urology.  Past Medical History:  Diagnosis Date  . AAA (abdominal aortic aneurysm) ([place]) 04/16/2018   4.3 cm, noted on [place]   . Acute kidney failure ([place]) 2005 approx   after ureteroscopy, hospitalized for about 10 days afterwards  . Anxiety   . Arthritis    Hands, wrist, elbow, back and spine  . Bulging lumbar disc    L4-L5 degeneration, Chronic back pain  . CAD (coronary artery disease)    a. 01/09/14: [place] s/p DES to RCA   . COPD (chronic obstructive pulmonary disease) (H[facility])    wheezing  . Dyspnea    with exertion  . Glaucoma of both eyes   . Hepatic cyst 2013   Several tiny hepatic cysts , noted on CT  . History of bilateral inguinal hernias   . History of kidney stones    small kidney stones in right kidney  . Hyperlipidemia   . Interstitial lung disease ([place])   . Leg pain    ABIs (10/15):  R 1.3, L 1.2, normal  . Orthopnea    uses 2 pillows  . Pneumonia   . Pulmonary nodule, right 08/02/2016   a. Right upper lobe  . Right ureteral stone   . Seasonal allergies   . 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  . 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 Aortosclerosis, bilateral - mild with calcification. Per Dr[facility] Moreen Fowler [facility]   . Tobacco abuse    . Wears dentures    upper   . Wears glasses     Past Surgical History:  Procedure Laterality Date  . ABDOMINAL AORTIC ENDOVASCULAR STENT GRAFT  06/21/2020   Procedure: ABDOMINAL AORTIC ENDOVASCULAR STENT GRAFT;  Surgeon: Marty Heck, MD;  Location: [place];  Service: Vascular;;  . BRAIN SURGERY    . CARDIAC CATHETERIZATION  2017   right carotid stent  . CATARACT EXTRACTION W/ INTRAOCULAR LENS  IMPLANT, BILATERAL  1997 (APPROX)  . CATARACT EXTRACTION W/PHACO Left 03/31/2020   Procedure: CATARACT EXTRACTION PHACO AND INTRAOCULAR LENS PLACEMENT (IOC) LEFT 5.51 01:16.1 7.2%;  Surgeon: Leandrew Koyanagi, MD;  Location: [place];  Service: Ophthalmology;  Laterality: Left;  . COLONOSCOPY    . CYSTOSCOPY/RETROGRADE/URETEROSCOPY/STONE EXTRACTION WITH BASKET  10/17/2011   Procedure: CYSTOSCOPY/RETROGRADE/URETEROSCOPY/STONE EXTRACTION WITH BASKET;  Surgeon: Hanley Ben, MD;  Location: [place];  Service: Urology;  Laterality: Right;  . CYSTOSCOPY/URETEROSCOPY/HOLMIUM LASER/STENT PLACEMENT Right 05/13/2018   Procedure: CYSTOSCOPY/RETROGRADE/URETEROSCOPY/HOLMIUM LASER;  Surgeon: Kathie Rhodes, MD;  Location: [facility];  Service: Urology;  Laterality: Right;  . HERNIA REPAIR    . LAPAROSCOPIC LEFT INGUINAL (OBTURATOR) HERNIA AND UMBILICAL HERNIA REPAIR  01-13-2011  . LEFT HEART CATHETERIZATION WITH CORONARY ANGIOGRAM  N/A 01/09/2014   Procedure: LEFT HEART CATHETERIZATION WITH CORONARY ANGIOGRAM;  Surgeon: Josue Hector, MD;  Location: [facility] CATH LAB;  Service: Cardiovascular;  Laterality: N/A;  . LEFT URETEROSCOPIC STONE EXTRACTION  11-30-2000   W/ PREVIOUS ESWL AND URETERAL STENT PLACEMENT  . PHOTOCOAGULATION Right 12/10/2019   Procedure: TRANSCLEARAL DIODE CYCLOPHOTOCOAGULATION RIGHT;  Surgeon: Leandrew Koyanagi, MD;  Location: [place];  Service: Ophthalmology;  Laterality: Right;  . REPAIR RIGHT INGUINAL HERNIA W/ MESH  12-31-2000   . TONSILLECTOMY    . ULTRASOUND GUIDANCE FOR VASCULAR ACCESS Bilateral 06/21/2020   Procedure: ULTRASOUND GUIDANCE FOR VASCULAR ACCESS;  Surgeon: Marty Heck, MD;  Location: [place];  Service: Vascular;  Laterality: Bilateral;  . URETEROLITHOTOMY  2009    Family History  Problem Relation Age of Onset  . Hypertension Mother   . Heart attack Other        uncle  . Stroke Other   . Stroke Maternal Grandmother   . Stroke Maternal Grandfather   . Stroke Paternal Grandmother   . Stroke Paternal Grandfather     SOCIAL HISTORY: Social History   Tobacco Use  . Smoking status: Former Smoker    Packs/day: 1.00    Years: 53.00    Pack years: 53.00    Types: Cigarettes    Quit date: 03/11/2018    Years since quitting: 2.3  . Smokeless tobacco: Never Used  Substance Use Topics  . Alcohol use: No    Allergies  Allergen Reactions  . Cefaclor Anaphylaxis, Rash and Swelling    Throat closed  . Nitrofurantoin Anaphylaxis    Right kidney shut down   . Chantix [Varenicline] Other (See Comments)    nightmares  . Nsaids Other (See Comments)    Current Outpatient Medications  Medication Sig Dispense Refill  . aspirin EC 81 MG EC tablet Take 1 tablet (81 mg total) by mouth daily at 6 (six) AM. Swallow whole.    Marland Kitchen atenolol (TENORMIN) 25 MG tablet Take 1 tablet (25 mg total) by mouth daily. 90 tablet 3  . atorvastatin (LIPITOR) 80 MG tablet Take 1 tablet (80 mg total) by mouth every morning. 90 tablet 3  . brimonidine (ALPHAGAN P) 0.1 % SOLN Place 1 drop into both eyes 2 (two) times daily.    . clopidogrel (PLAVIX) 75 MG tablet Take 1 tablet by mouth once daily (Patient taking differently: Take 75 mg by mouth daily.) 90 tablet 0  . Coenzyme Q10 (CO Q 10) 100 MG CAPS Take 100 mg by mouth daily.    . dorzolamide-timolol (COSOPT) 22.3-6.8 MG/ML ophthalmic solution Place 1 drop into both eyes 2 (two) times daily.    Marland Kitchen gabapentin (NEURONTIN) 100 MG capsule Take 1 capsule (100 mg total) by  mouth 3 (three) times daily. 90 capsule 0  . HYDROcodone-acetaminophen (NORCO/VICODIN) 5-325 MG tablet Take 1 tablet by mouth every 6 (six) hours as needed for moderate pain. 12 tablet 0  . latanoprost (XALATAN) 0.005 % ophthalmic solution Place 1 drop into both eyes at bedtime.    . Multiple Vitamins-Minerals (ICAPS AREDS FORMULA PO) Take 2 tablets by mouth daily.     . nitroGLYCERIN (NITROSTAT) 0.4 MG SL tablet Place 1 tablet (0.4 mg total) under the tongue every 5 (five) minutes as needed for chest pain. 25 tablet 12  . OMEGA-3 KRILL OIL PO Take 200 mg by mouth daily.    Marland Kitchen PROAIR HFA 108 (90 Base) MCG/ACT inhaler Inhale 2 puffs into the lungs every 4 (  four) hours as needed for wheezing.    . pseudoephedrine (SUDAFED) 120 MG 12 hr tablet Take 120 mg by mouth daily as needed for congestion.    Marland Kitchen tiotropium (SPIRIVA) 18 MCG inhalation capsule Place 18 mcg into inhaler and inhale every morning.    . traZODone (DESYREL) 100 MG tablet Take 100 mg by mouth at bedtime.     . methocarbamol (ROBAXIN) 500 MG tablet TAKE 1 TABLET BY MOUTH EVERY 8 HOURS AS NEEDED (Patient not taking: No sig reported) 30 tablet 0   No current facility-administered medications for this visit.    REVIEW OF SYSTEMS:  [X]  denotes positive finding, [ ]  denotes negative finding Cardiac  Comments:  Chest pain or chest pressure:    Shortness of breath upon exertion:    Short of breath when lying flat:    Irregular heart rhythm:        Vascular    Pain in calf, thigh, or hip brought on by ambulation:    Pain in feet at night that wakes you up from your sleep:     Blood clot in your veins:    Leg swelling:         Pulmonary    Oxygen at home:    Productive cough:     Wheezing:         Neurologic    Sudden weakness in arms or legs:     Sudden numbness in arms or legs:     Sudden onset of difficulty speaking or slurred speech:    Temporary loss of vision in one eye:     Problems with dizziness:          Gastrointestinal    Blood in stool:     Vomited blood:         Genitourinary    Burning when urinating:     Blood in urine:        Psychiatric    Major depression:         Hematologic    Bleeding problems:    Problems with blood clotting too easily:        Skin    Rashes or ulcers:        Constitutional    Fever or chills:      PHYSICAL EXAM: Vitals:   07/27/20 0814  BP: (!) 144/91  Pulse: 79  Resp: 16  Temp: 97.7 F (36.5 C)  TempSrc: Temporal  SpO2: 94%  Weight: 127 lb (57.6 kg)  Height: 5\' 9"  (1.753 m)    GENERAL: The patient is a well-nourished male, in no acute distress. The vital signs are documented above. CARDIAC: There is a regular rate and rhythm.  VASCULAR:  Bilateral groin incisions well-healed Bilateral femoral pulses palpable Bilateral DP PT pulses palpable  DATA:   CTA 07/22/2020 reviewed with good position of the infrarenal abdominal stent graft and no endoleak.  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  Assessment/Plan:  80 year old male status post infrarenal aortobiiliac stent graft on 06/21/2020 for a 5.9 cm abdominal aortic aneurysm.  He is doing quite well after surgery and his groin incisions have healed without issue with palpable pedal pulses.  I reviewed his CTA that shows good position of the stent graft with no evidence of endoleak.  I will see him in 6 months with EVAR duplex here in the office for ongoing surveillance.  Marty Heck, MD [facility] Office: [phone]

## 2020-07-29 ENCOUNTER — Other Ambulatory Visit: Payer: Self-pay | Admitting: Hospice

## 2020-07-29 ENCOUNTER — Other Ambulatory Visit: Payer: Self-pay

## 2020-07-29 DIAGNOSIS — Z515 Encounter for palliative care: Secondary | ICD-10-CM

## 2020-07-29 DIAGNOSIS — I714 Abdominal aortic aneurysm, without rupture, unspecified: Secondary | ICD-10-CM

## 2020-07-29 DIAGNOSIS — D494 Neoplasm of unspecified behavior of bladder: Secondary | ICD-10-CM

## 2020-07-29 NOTE — Progress Notes (Addendum)
[facility] Consult Note Telephone: [phone]  Fax: [phone]  PATIENT NAME: Connor Cuevas [address] [place] [address] [phone] (home)  DOB: 1940-08-17 MRN: [id number]  PRIMARY CARE PROVIDER:    [name], MD,  [address] [place] [address] [phone]  REFERRING PROVIDER:   [name], MD [employer] [place],  [place] [address] [phone]  RESPONSIBLE PARTY:   Self [phone] c Spouse is VOHYW [phone] c Contact Information    Name Relation Home Work [address]   Blayn, Whetsell [phone]  [phone]   Butch, Otterson. Son [phone]  [phone]       I met face to face with patient and family at home. Palliative Care was asked to follow this patient by consultation request of  [name], MD to address advance care planning and complex medical decision making. This is the initial visit.  Anderson Malta joined visit via Social worker.    ASSESSMENT AND / RECOMMENDATIONS:   Advance Care Planning: Our advance care planning conversation included a discussion about:     The value and importance of advance care planning   Difference between Hospice and Palliative care  Exploration of goals of care in the event of a sudden injury or illness   Identification and preparation of a healthcare agent   Review and updating or creation of an  advance directive document .  Decision not to resuscitate or to de-escalate disease focused treatments due to poor prognosis.  CODE STATUS: Patient is a full code; desires for heroic measures to keep him alive.   Goals of Care: Goals include to maximize quality of life and symptom management  I spent 20 minutes providing this initial consultation. More than 50% of the time in this consultation was spent on counseling patient and coordinating  communication. --------------------------------------------------------------------------------------------------------------------------------------  Symptom Management/Plan: Bladder CA: Newly diagnosed 3 weeks ago.  Followed by Oncologist at [facility] is to remove the tumor 08/13/2020.  Hydrocodone 5/'3 2 5 ' mg every 6 hours as needed for pain.  Routine CBC BMP.  CT scans as needed to monitor tumor/treatment. Weight loss: 4-6 small meals. Patient reports appetite is improving AAA repair check up with CT scan and no leaks.  COPD: Continue Spiriva, Albuterol.  Avoid irritants.  Slow deep breathing.  Use fan for air circulation. Follow up: Palliative care will continue to follow for complex medical decision making, advance care planning, and clarification of goals. Return 6 weeks or prn.Encouraged to call provider sooner with any concerns.   PPS: 60%  HOSPICE ELIGIBILITY/DIAGNOSIS: TBD  Chief Complaint: Initial palliative care visit: Bladder CA  HISTORY OF PRESENT ILLNESS:  Connor Cuevas is a 80 y.o. year old male  with multiple medical conditions including acute bladder CA diagnosed three weeks ago, with associated symptoms of blood in urine, weight loss, low back pain. Patient said he feels tired and worn out, impairing his ADLs.  Resting in between activities seem to help.  Hydrocodone is helpful for pain.  He denies pain/discomfort during visit.  Plan is to remove the tumor 08/13/2020.  He reports he underwent AAA repair five weeks ago. Epic CT scan review 07/22/2020- Aorta: Interval No endoleak. No evidence of leak or impending rupture.CT yesterday showed everything is clear.  History of kidney tumor, COPD, hypertension, CAD.  Obtained from review of EMR, discussion with primary team, caregiver, family and/or Connor Cuevas.  Review and summarization of Epic records shows history from other than patient. Rest of 10 point ROS asked and negative.  Palliative Care was asked to follow this  patient by consultation request of [name], MD to help address complex decision making in the context of advance care planning and goals of care clarification.    Review of lab tests/diagnostics   Results for CLEMENS, LACHMAN (MRN [id number]) as of 07/29/2020 16:18  Ref. Range 06/22/2020 05:30  Sodium Latest Ref Range: 135 - 145 mmol/L 137  Potassium Latest Ref Range: 3.5 - 5.1 mmol/L 3.9  Chloride Latest Ref Range: 98 - 111 mmol/L 107  CO2 Latest Ref Range: 22 - 32 mmol/L 23  Glucose Latest Ref Range: 70 - 99 mg/dL 96  BUN Latest Ref Range: 8 - 23 mg/dL 13  Creatinine Latest Ref Range: 0.61 - 1.24 mg/dL 0.82  Calcium Latest Ref Range: 8.9 - 10.3 mg/dL 8.8 (L)  Anion gap Latest Ref Range: 5 - 15  7  Results for MAXON, KRESSE (MRN [id number]) as of 07/29/2020 16:18  Ref. Range 06/22/2020 05:30  WBC Latest Ref Range: 4.0 - 10.5 K/uL 10.6 (H)  RBC Latest Ref Range: 4.22 - 5.81 MIL/uL 4.20 (L)  Hemoglobin Latest Ref Range: 13.0 - 17.0 g/dL 12.7 (L)  HCT Latest Ref Range: 39.0 - 52.0 % 37.4 (L)  MCV Latest Ref Range: 80.0 - 100.0 fL 89.0  MCH Latest Ref Range: 26.0 - 34.0 pg 30.2  MCHC Latest Ref Range: 30.0 - 36.0 g/dL 34.0  RDW Latest Ref Range: 11.5 - 15.5 % 13.6  Platelets Latest Ref Range: 150 - 400 K/uL 227  nRBC Latest Ref Range: 0.0 - 0.2 % 0.0   ROS General: NAD EYES: denies vision changes ENMT: denies dysphagia Cardiovascular: denies chest pain/discomfort Pulmonary: endorses occasional cough, productive with clear phlegm, denies SOB Abdomen: endorses fair to good appetite, denies constipation/diarrhea GU: denies dysuria or urinary frequency MSK:  Endorses weakness,  no falls reported Skin: denies rashes or wounds Neurological: denies pain, denies insomnia Psych: Endorses positive mood Heme/lymph/immuno: denies bruises, abnormal bleeding  Physical Exam: BP 142/70 P73 R 18 02 98%  Height/Weight: 5 feet 9 inches/129 Ibs down from 135 Ibs 6 months ago Constitutional:  NAD General: Well groomed, cooperative EYES: anicteric sclera, lids intact, no discharge  ENMT: Moist mucous membrane CV: S1 S2, RRR, no BLE edema Pulmonary: LCTA, no increased work of breathing, no cough, Abdomen: active BS + 4 quadrants, soft and non tender, no ascites GU: no suprapubic tenderness MSK: weakness, sarcopenia, limited ROM Skin: warm and dry, no rashes or wounds on visible skin, hair loss to BLE, cool to touch Neuro:  weakness, otherwise non focal, alert and oriented x4 Psych: non-anxious affect Hem/lymph/immuno: no widespread bruising   PAST MEDICAL HISTORY:  Active Ambulatory Problems    Diagnosis Date Noted  . ANXIETY 09/25/2008  . 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 Aortic valve disorder 09/25/2008  . Pulmonary nodule 09/28/2008  . Unstable angina ([facility]) 01/10/2014  . Hyperlipidemia   . CAD (coronary artery disease)   . COPD (chronic obstructive pulmonary disease) ([facility])   . AAA (abdominal aortic aneurysm) ([facility])   . 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 Tobacco abuse    Resolved Ambulatory Problems    Diagnosis Date Noted  . Hernia, inguinal, left 12/14/2010   Past Medical History:  Diagnosis Date  . Acute kidney failure ([facility]) 2005 approx  . Anxiety   . Arthritis   .  Bulging lumbar disc   . Dyspnea   . Glaucoma of both eyes   . Hepatic cyst 2013  . History of bilateral inguinal hernias   . History of kidney stones   . Interstitial lung disease ([facility])   . Leg pain   . Orthopnea   . Pneumonia   . Pulmonary nodule, right 08/02/2016  . Right ureteral stone   . Seasonal allergies   . Skin cancer   . Wears dentures   . Wears glasses     SOCIAL HX:  Social History   Tobacco Use  . Smoking status: Former Smoker    Packs/day: 1.00    Years: 53.00    Pack years: 53.00    Types: Cigarettes    Quit date: 03/11/2018    Years since quitting: 2.3  . Smokeless tobacco: Never Used  Substance Use Topics  . Alcohol use: No    Family /Caregiver/Community  Supports: Patient lives at home with spouse.    FAMILY HX:  Family History  Problem Relation Age of Onset  . Hypertension Mother   . Heart attack Other        uncle  . Stroke Other   . Stroke Maternal Grandmother   . Stroke Maternal Grandfather   . Stroke Paternal Grandmother   . Stroke Paternal Grandfather       ALLERGIES:  Allergies  Allergen Reactions  . Cefaclor Anaphylaxis, Rash and Swelling    Throat closed  . Nitrofurantoin Anaphylaxis    Right kidney shut down   . Chantix [Varenicline] Other (See Comments)    nightmares  . Nsaids Other (See Comments)      PERTINENT MEDICATIONS:  Outpatient Encounter Medications as of 07/29/2020  Medication Sig  . aspirin EC 81 MG EC tablet Take 1 tablet (81 mg total) by mouth daily at 6 (six) AM. Swallow whole.  Marland Kitchen atenolol (TENORMIN) 25 MG tablet Take 1 tablet (25 mg total) by mouth daily.  Marland Kitchen atorvastatin (LIPITOR) 80 MG tablet Take 1 tablet (80 mg total) by mouth every morning.  . brimonidine (ALPHAGAN P) 0.1 % SOLN Place 1 drop into both eyes 2 (two) times daily.  . clopidogrel (PLAVIX) 75 MG tablet Take 1 tablet by mouth once daily (Patient taking differently: Take 75 mg by mouth daily.)  . Coenzyme Q10 (CO Q 10) 100 MG CAPS Take 100 mg by mouth daily.  . dorzolamide-timolol (COSOPT) 22.3-6.8 MG/ML ophthalmic solution Place 1 drop into both eyes 2 (two) times daily.  Marland Kitchen gabapentin (NEURONTIN) 100 MG capsule Take 1 capsule (100 mg total) by mouth 3 (three) times daily.  Marland Kitchen HYDROcodone-acetaminophen (NORCO/VICODIN) 5-325 MG tablet Take 1 tablet by mouth every 6 (six) hours as needed for moderate pain.  Marland Kitchen latanoprost (XALATAN) 0.005 % ophthalmic solution Place 1 drop into both eyes at bedtime.  . methocarbamol (ROBAXIN) 500 MG tablet TAKE 1 TABLET BY MOUTH EVERY 8 HOURS AS NEEDED (Patient not taking: No sig reported)  . Multiple Vitamins-Minerals (ICAPS AREDS FORMULA PO) Take 2 tablets by mouth daily.   . nitroGLYCERIN (NITROSTAT) 0.4  MG SL tablet Place 1 tablet (0.4 mg total) under the tongue every 5 (five) minutes as needed for chest pain.  Marland Kitchen OMEGA-3 KRILL OIL PO Take 200 mg by mouth daily.  Marland Kitchen PROAIR HFA 108 (90 Base) MCG/ACT inhaler Inhale 2 puffs into the lungs every 4 (four) hours as needed for wheezing.  . pseudoephedrine (SUDAFED) 120 MG 12 hr tablet Take 120 mg by mouth daily as  needed for congestion.  Marland Kitchen tiotropium (SPIRIVA) 18 MCG inhalation capsule Place 18 mcg into inhaler and inhale every morning.  . traZODone (DESYREL) 100 MG tablet Take 100 mg by mouth at bedtime.    No facility-administered encounter medications on file as of 07/29/2020.     Thank you for the opportunity to participate in the care of Connor Cuevas.  The palliative care team will continue to follow. Please call our office at [phone] if we can be of additional assistance.   Note: Portions of this note were generated with [employer]. Dictation errors may occur despite best attempts at proofreading.  Teodoro Spray, NP

## 2020-07-30 ENCOUNTER — Other Ambulatory Visit: Payer: Self-pay

## 2020-07-30 ENCOUNTER — Ambulatory Visit (HOSPITAL_COMMUNITY)
Admission: RE | Admit: 2020-07-30 | Discharge: 2020-07-30 | Disposition: A | Discharge status: Home / Self Care | Payer: Medicare PPO | Source: Ambulatory Visit | Attending: Urology | Admitting: Urology

## 2020-07-30 DIAGNOSIS — D49511 Neoplasm of unspecified behavior of right kidney: Secondary | ICD-10-CM | POA: Diagnosis not present

## 2020-07-30 DIAGNOSIS — N2889 Other specified disorders of kidney and ureter: Secondary | ICD-10-CM | POA: Diagnosis not present

## 2020-07-30 DIAGNOSIS — N200 Calculus of kidney: Secondary | ICD-10-CM | POA: Diagnosis not present

## 2020-07-30 IMAGING — MR MR ABDOMEN WO/W CM
10 of 18 series · 23 of 48 positions shown · IV contrast (gadavist)
Comparison: CT abdomen pelvis angiogram, [DATE], [DATE],
[DATE]

CLINICAL DATA: Characterize suspected renal mass, superior pole of
the right kidney

EXAM:
MRI ABDOMEN WITHOUT AND WITH CONTRAST
TECHNIQUE: Multiplanar multisequence MR imaging of the abdomen was performed
both before and after the administration of intravenous contrast.
CONTRAST:  5.5mL GADAVIST GADOBUTROL 1 MMOL/ML IV SOLN

[Series 3: cor ssfse nav · coronal · 6.0mm · 0.78mm/px · 2 of 33 slices shown]
[im 1/33]
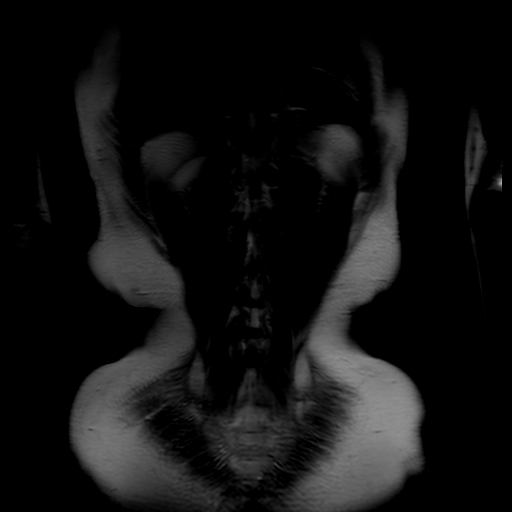
[im 33/33]
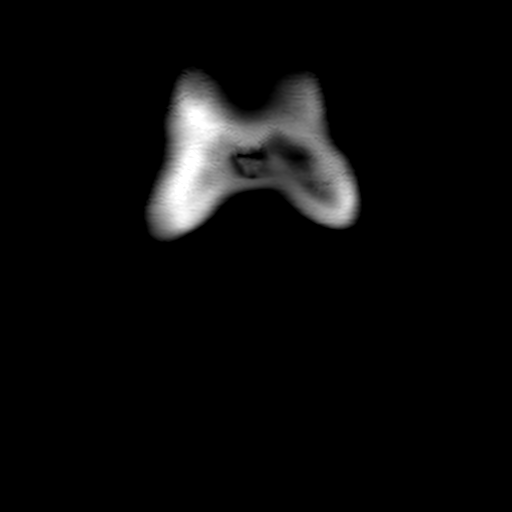

[Series 4: ax ssfse nav · axial · 6.0mm · 0.74mm/px · z∈[-27,+153]mm · 2 of 31 slices shown]
[im 1/31]
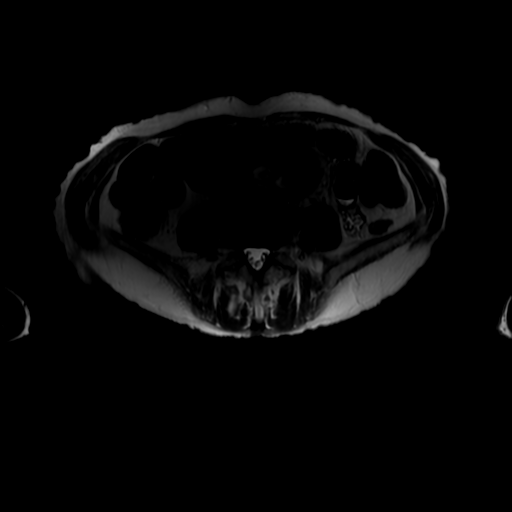
[im 31/31]
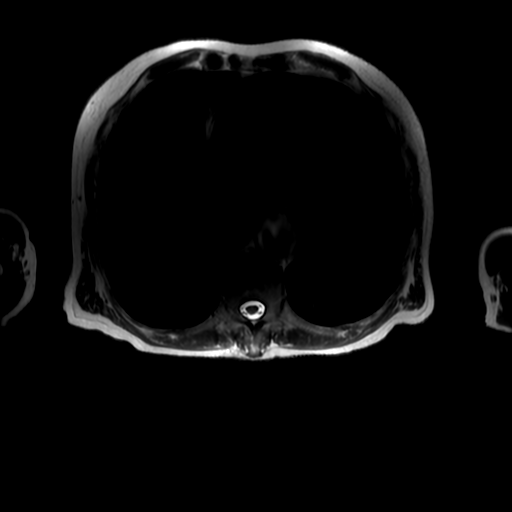

[Series 5: T2 fat-sat · axial · 6.0mm · 0.74mm/px · 1 of 31 slices shown]
[im 1/31]
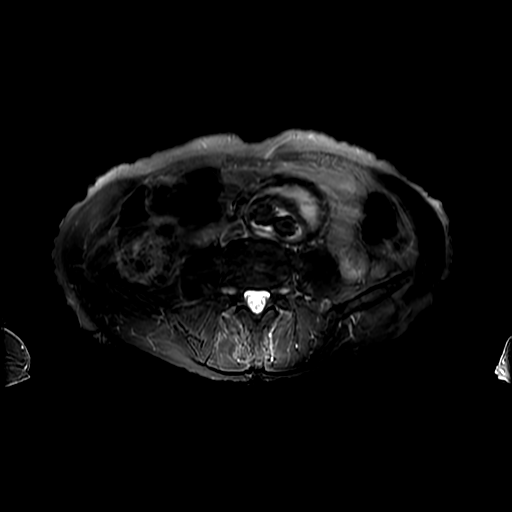

[Series 6: DWI b500 · axial · 8.0mm · 1.76mm/px · z∈[-102,+168]mm · 2 of 56 slices shown]
[im 1/56]
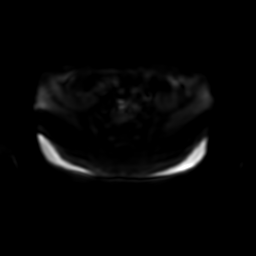
[im 56/56]
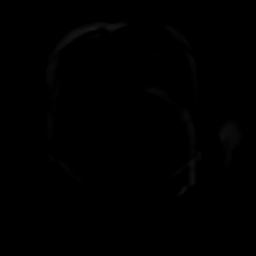

[Series 9: T1 dynamic · coronal · 3.4mm · 1.56mm/px · 4 of 120 slices shown]
[im 1/120]
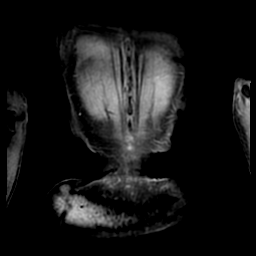
[im 40/120]
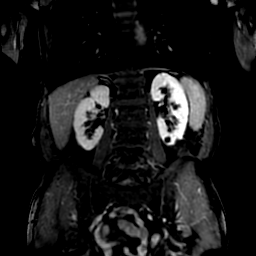
[im 80/120]
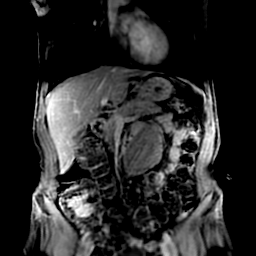
[im 120/120]
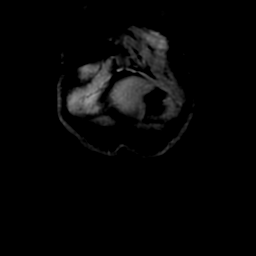

[Series 650: ADC · axial · 8.0mm · 1.76mm/px · 1 of 28 slices shown]
[im 1/28]
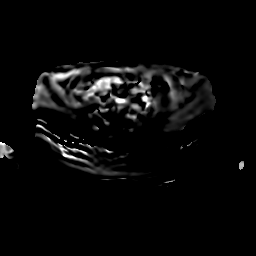

[Series 800: T1 dynamic post-contrast · axial · non-contrast · 4.0mm · 0.82mm/px · z∈[-55,+167]mm · 3 of 112 slices shown (1 of 4)]
[im 1/112]
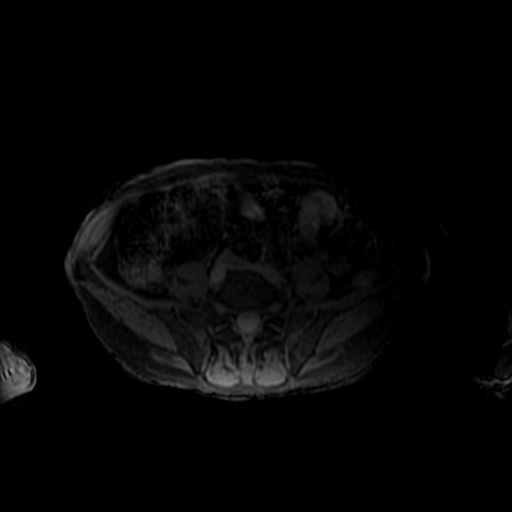
[im 56/112]
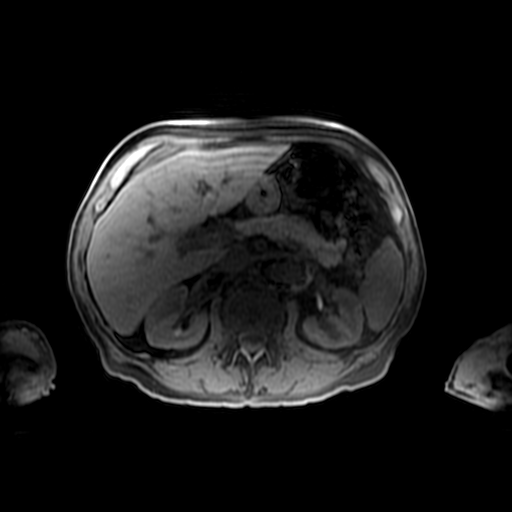
[im 112/112]
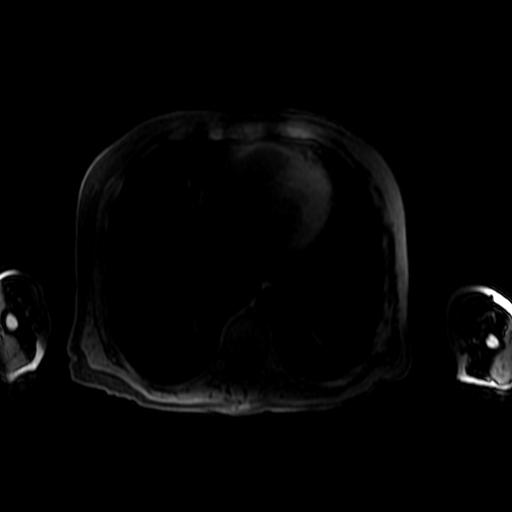

[Series 801: T1 dynamic post-contrast · axial · non-contrast · 4.0mm · 0.82mm/px · z∈[-55,+167]mm · 3 of 112 slices shown (2 of 4)]
[im 1/112]
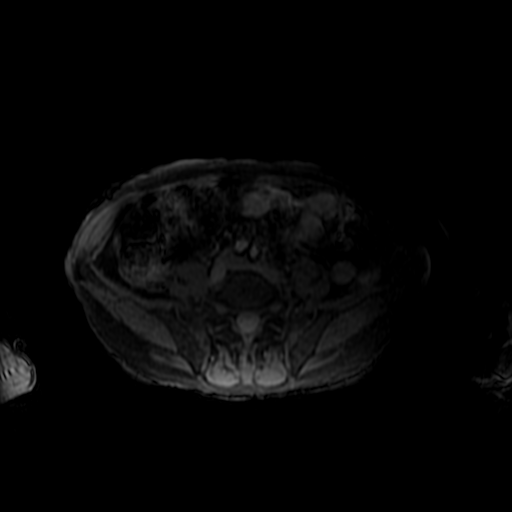
[im 56/112]
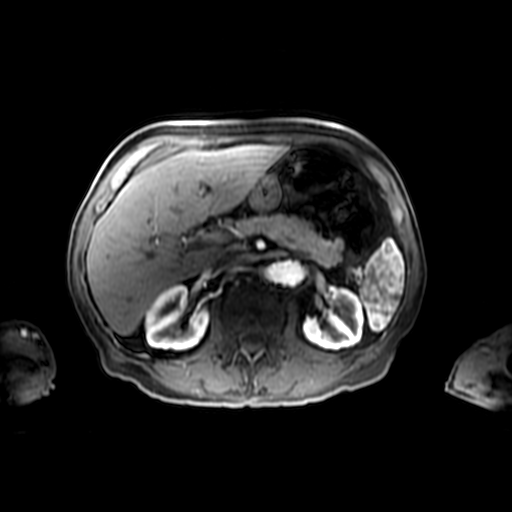
[im 112/112]
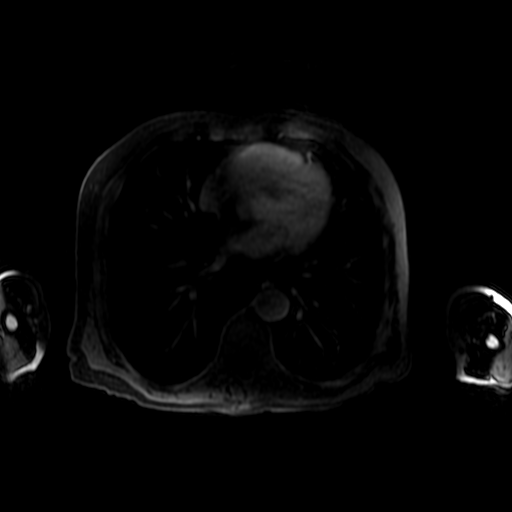

[Series 802: T1 dynamic post-contrast · axial · non-contrast · 4.0mm · 0.82mm/px · z∈[-55,+167]mm · 3 of 112 slices shown (3 of 4)]
[im 1/112]
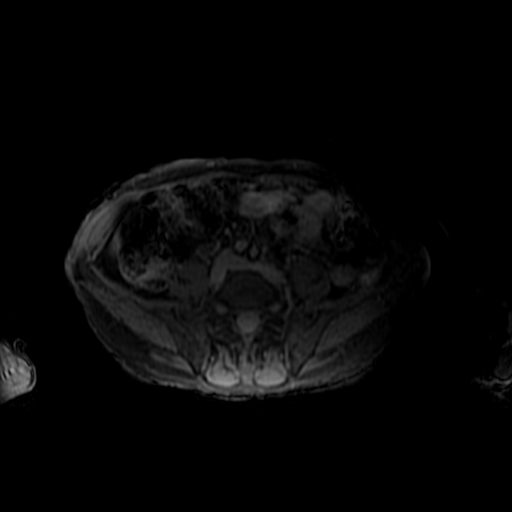
[im 56/112]
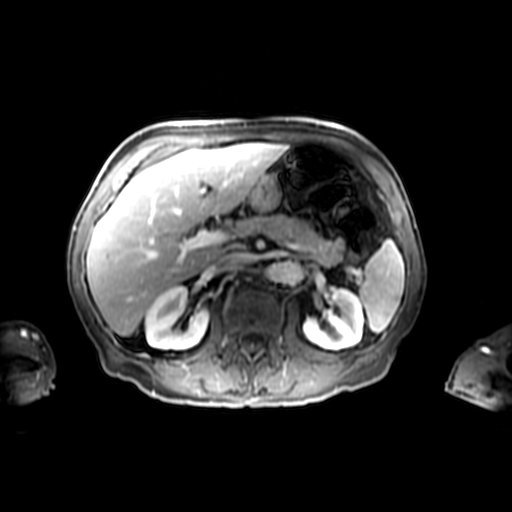
[im 112/112]
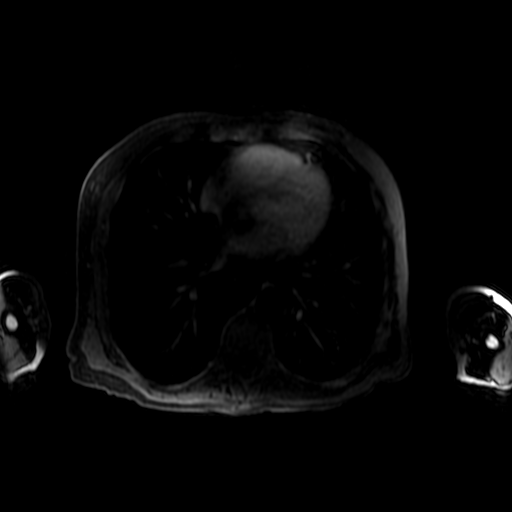

[Series 803: T1 dynamic post-contrast · axial · non-contrast · 4.0mm · 0.82mm/px · z∈[-55,+55]mm · 2 of 112 slices shown (4 of 4)]
[im 1/112]
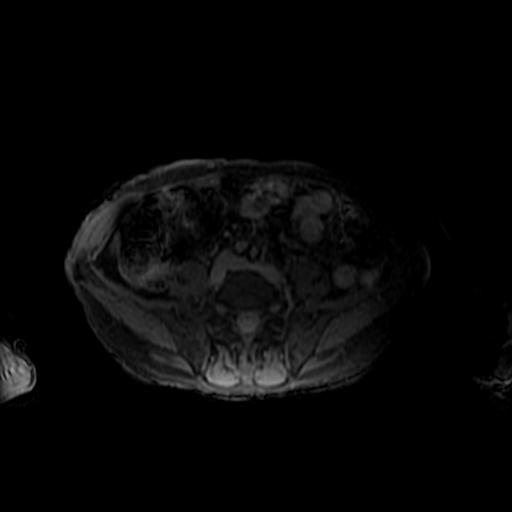
[im 56/112]
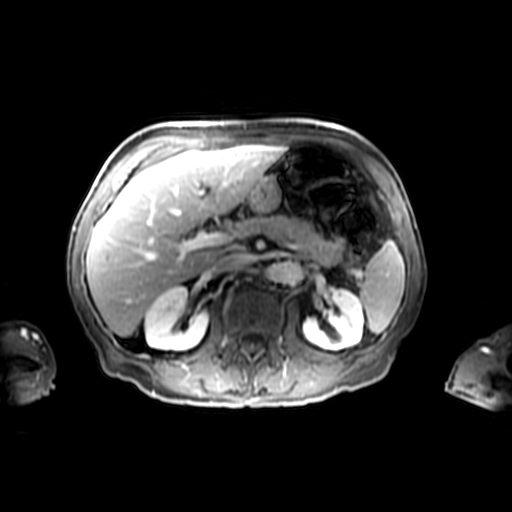

[23 of 48 positions shown; findings below may reference images not displayed]

FINDINGS: Lower chest: No acute findings.

Hepatobiliary: No mass or other parenchymal abnormality identified.

Pancreas: No mass, inflammatory changes, or other parenchymal
abnormality identified.

Spleen:  Within normal limits in size and appearance.

Adrenals/Urinary Tract: There is a contrast enhancing, exophytic
mass of the lateral superior pole of the right kidney measuring
x 1.0 cm (series 801, image 62). Susceptibility artifact from
multiple bilateral renal calculi. Bilateral renal cortical scarring.
No evidence of hydronephrosis.

Stomach/Bowel: Visualized portions within the abdomen are
unremarkable.

Vascular/Lymphatic: No pathologically enlarged lymph nodes
identified. Large saccular aneurysm of the abdominal aorta, status
post aortobiliac stent endograft repair, better assessed by recent
CT angiogram.

Other:  None.

Musculoskeletal: No suspicious bone lesions identified.
IMPRESSION: 1. There is a contrast enhancing, exophytic mass of the lateral
superior pole of the right kidney measuring 1.6 x 1.0 cm. By imaging
characteristics this is most suggestive of a small renal cell
carcinoma, however this lesion has been very indolent over time on
multiple examinations dating back to at least [13]. Renal cell
carcinoma may nonetheless demonstrate little or no growth over time
and this lesion remains suspicious.
2. No evidence of lymphadenopathy or metastatic disease in the
abdomen.
3. Large saccular aneurysm of the abdominal aorta, status post
aortobiliac stent endograft repair, better assessed by recent CT
angiogram.

## 2020-07-30 MED ORDER — GADOBUTROL 1 MMOL/ML IV SOLN
5.5000 mL | Freq: Once | INTRAVENOUS | Status: AC | PRN
  Administered 2020-07-30: 5.5 mL via INTRAVENOUS

## 2020-08-02 ENCOUNTER — Other Ambulatory Visit: Payer: Medicare PPO | Admitting: *Deleted

## 2020-08-02 ENCOUNTER — Other Ambulatory Visit: Payer: Self-pay

## 2020-08-02 DIAGNOSIS — I2510 Atherosclerotic heart disease of native coronary artery without angina pectoris: Secondary | ICD-10-CM

## 2020-08-02 DIAGNOSIS — I714 Abdominal aortic aneurysm, without rupture, unspecified: Secondary | ICD-10-CM

## 2020-08-02 DIAGNOSIS — E782 Mixed hyperlipidemia: Secondary | ICD-10-CM

## 2020-08-02 LAB — LIPID PANEL
Chol/HDL Ratio: 3.7 ratio (ref 0.0–5.0)
Cholesterol, Total: 194 mg/dL (ref 100–199)
HDL: 52 mg/dL (ref >39)
LDL Chol Calc (NIH): 114 mg/dL — ABNORMAL HIGH (ref 0–99)
Triglycerides: 157 mg/dL — ABNORMAL HIGH (ref 0–149)
VLDL Cholesterol Cal: 28 mg/dL (ref 5–40)

## 2020-08-02 LAB — HEPATIC FUNCTION PANEL
ALT: 12 IU/L (ref 0–44)
AST: 16 IU/L (ref 0–40)
Albumin: 4.5 g/dL (ref 3.7–4.7)
Alkaline Phosphatase: 65 IU/L (ref 44–121)
Bilirubin Total: 0.6 mg/dL (ref 0.0–1.2)
Bilirubin, Direct: 0.15 mg/dL (ref 0.00–0.40)
Total Protein: 7 g/dL (ref 6.0–8.5)

## 2020-08-04 NOTE — Progress Notes (Signed)
Spoke with md chelsea woodrum mda and reviewed pt history, pt is ok for [facility], barring any acute status changes  and does not need vascular clearance for 08-13-2020 surgery per dr Vikki Ports woodrum mda.  Spoke w/ via phone for pre-op interview---pt Lab needs dos----    I stat           Lab results------see below COVID test ------08-10-2020 1000 Arrive at -------900 am 08-13-2020 NPO after MN NO Solid Food.  Clear liquids from MN until---800 am then npo Med rec completed Medications to take morning of surgery -----atorvastatin, atenolol, eye drop, proair inhaler prb/bring inhaler, spiriva, hydrocodone prn Diabetic medication -----n/a Patient instructed to bring photo id and insurance card day of surgery Patient aware to have Driver (ride ) / caregiver  Wife karen will stay   for 24 hours after surgery  Patient Special Instructions -----none Pre-Op special Istructions -----none Patient verbalized understanding of instructions that were given at this phone interview. Patient denies shortness of breath, chest pain, fever, cough at this phone interview.  Anesthesia Review:s/sp aaa repair 3-32-2022, cad , s/p des mid rca 2015,  interstital lung disease managed by pcp, pt denies cardiac S &S, or sob at pre op phone call and states has not used nitro in years, cardiac clearcne note cadence furth pa 07-15-2020 chart/epic  PCP: dr [name] Cardiologist :dr Macon Large 05-04-2020 epic Chest x-ray :06-21-2020 epic EKG :05-04-2020 epic Echo : Stress test:06-05-2019 epic Cardiac Cath : 01-09-2014 epic Vascular lov post op check 07-27-2020 epic dr Carlis Abbott Activity level: does house work and can climb steps without problems Sleep Study/ CPAP :n/a Fasting Blood Sugar :      / Checks Blood Sugar -- times a day:  n/a Blood Thinner/ Instructions /Last Dose: pt aware to stop plavix 5 days before surgery per 08-13-2020 note dr [name] chart/epic ASA / Instructions/ Last Dose :  Pt aware to stop 81 mg aspirin 5 days before  surgery per dr Virgina Norfolk instructions

## 2020-08-05 ENCOUNTER — Other Ambulatory Visit: Payer: Self-pay

## 2020-08-05 ENCOUNTER — Encounter (HOSPITAL_BASED_OUTPATIENT_CLINIC_OR_DEPARTMENT_OTHER): Payer: Self-pay | Admitting: Urology

## 2020-08-10 ENCOUNTER — Other Ambulatory Visit (HOSPITAL_COMMUNITY)
Admission: RE | Admit: 2020-08-10 | Discharge: 2020-08-10 | Disposition: A | Discharge status: Home / Self Care | Payer: Medicare PPO | Source: Ambulatory Visit | Attending: Urology | Admitting: Urology

## 2020-08-10 DIAGNOSIS — Z01812 Encounter for preprocedural laboratory examination: Secondary | ICD-10-CM | POA: Diagnosis not present

## 2020-08-10 DIAGNOSIS — Z20822 Contact with and (suspected) exposure to covid-19: Secondary | ICD-10-CM | POA: Diagnosis not present

## 2020-08-10 LAB — SARS CORONAVIRUS 2 (TAT 6-24 HRS): SARS Coronavirus 2: NEGATIVE

## 2020-08-13 ENCOUNTER — Ambulatory Visit (HOSPITAL_BASED_OUTPATIENT_CLINIC_OR_DEPARTMENT_OTHER)
Admission: RE | Admit: 2020-08-13 | Discharge: 2020-08-13 | Disposition: A | Discharge status: Home / Self Care | Payer: Medicare PPO | Attending: Urology | Admitting: Urology

## 2020-08-13 ENCOUNTER — Ambulatory Visit (HOSPITAL_BASED_OUTPATIENT_CLINIC_OR_DEPARTMENT_OTHER): Payer: Medicare PPO | Admitting: Anesthesiology

## 2020-08-13 ENCOUNTER — Encounter (HOSPITAL_BASED_OUTPATIENT_CLINIC_OR_DEPARTMENT_OTHER): Payer: Self-pay | Admitting: Urology

## 2020-08-13 ENCOUNTER — Encounter (HOSPITAL_BASED_OUTPATIENT_CLINIC_OR_DEPARTMENT_OTHER)
Admission: RE | Disposition: A | Discharge status: Home / Self Care | Payer: Self-pay | Source: Home / Self Care | Attending: Urology

## 2020-08-13 DIAGNOSIS — I25110 Atherosclerotic heart disease of native coronary artery with unstable angina pectoris: Secondary | ICD-10-CM | POA: Diagnosis not present

## 2020-08-13 DIAGNOSIS — N200 Calculus of kidney: Secondary | ICD-10-CM | POA: Diagnosis not present

## 2020-08-13 DIAGNOSIS — Z7982 Long term (current) use of aspirin: Secondary | ICD-10-CM | POA: Insufficient documentation

## 2020-08-13 DIAGNOSIS — Z87891 Personal history of nicotine dependence: Secondary | ICD-10-CM | POA: Insufficient documentation

## 2020-08-13 DIAGNOSIS — Z87442 Personal history of urinary calculi: Secondary | ICD-10-CM | POA: Diagnosis not present

## 2020-08-13 DIAGNOSIS — Z881 Allergy status to other antibiotic agents status: Secondary | ICD-10-CM | POA: Insufficient documentation

## 2020-08-13 DIAGNOSIS — N3289 Other specified disorders of bladder: Secondary | ICD-10-CM

## 2020-08-13 DIAGNOSIS — N401 Enlarged prostate with lower urinary tract symptoms: Secondary | ICD-10-CM | POA: Insufficient documentation

## 2020-08-13 DIAGNOSIS — Z955 Presence of coronary angioplasty implant and graft: Secondary | ICD-10-CM | POA: Diagnosis not present

## 2020-08-13 DIAGNOSIS — D494 Neoplasm of unspecified behavior of bladder: Secondary | ICD-10-CM | POA: Diagnosis not present

## 2020-08-13 DIAGNOSIS — Z79899 Other long term (current) drug therapy: Secondary | ICD-10-CM | POA: Insufficient documentation

## 2020-08-13 DIAGNOSIS — Z7902 Long term (current) use of antithrombotics/antiplatelets: Secondary | ICD-10-CM | POA: Insufficient documentation

## 2020-08-13 DIAGNOSIS — I10 Essential (primary) hypertension: Secondary | ICD-10-CM | POA: Diagnosis not present

## 2020-08-13 DIAGNOSIS — Z886 Allergy status to analgesic agent status: Secondary | ICD-10-CM | POA: Diagnosis not present

## 2020-08-13 DIAGNOSIS — N329 Bladder disorder, unspecified: Secondary | ICD-10-CM | POA: Diagnosis present

## 2020-08-13 DIAGNOSIS — E785 Hyperlipidemia, unspecified: Secondary | ICD-10-CM | POA: Diagnosis not present

## 2020-08-13 DIAGNOSIS — C672 Malignant neoplasm of lateral wall of bladder: Secondary | ICD-10-CM | POA: Diagnosis not present

## 2020-08-13 HISTORY — DX: Other specified disorders of bladder: N32.89

## 2020-08-13 HISTORY — DX: Blindness, one eye, unspecified eye: H54.40

## 2020-08-13 HISTORY — PX: CYSTOSCOPY W/ URETERAL STENT PLACEMENT: SHX1429

## 2020-08-13 HISTORY — PX: TRANSURETHRAL RESECTION OF BLADDER TUMOR: SHX2575

## 2020-08-13 LAB — POCT I-STAT, CHEM 8
BUN: 22 mg/dL (ref 8–23)
Calcium, Ion: 1.37 mmol/L (ref 1.15–1.40)
Chloride: 105 mmol/L (ref 98–111)
Creatinine, Ser: 1 mg/dL (ref 0.61–1.24)
Glucose, Bld: 99 mg/dL (ref 70–99)
HCT: 49 % (ref 39.0–52.0)
Hemoglobin: 16.7 g/dL (ref 13.0–17.0)
Potassium: 4.1 mmol/L (ref 3.5–5.1)
Sodium: 141 mmol/L (ref 135–145)
TCO2: 24 mmol/L (ref 22–32)

## 2020-08-13 SURGERY — TURBT (TRANSURETHRAL RESECTION OF BLADDER TUMOR)
Anesthesia: General | Site: Ureter

## 2020-08-13 MED ORDER — SODIUM CHLORIDE 0.9 % IR SOLN
Status: DC | PRN
  Administered 2020-08-13: 6000 mL via INTRAVESICAL

## 2020-08-13 MED ORDER — ACETAMINOPHEN 500 MG PO TABS
ORAL_TABLET | ORAL | Status: AC
  Filled 2020-08-13: qty 2

## 2020-08-13 MED ORDER — PROPOFOL 10 MG/ML IV BOLUS
INTRAVENOUS | Status: DC | PRN
  Administered 2020-08-13: 100 mg via INTRAVENOUS

## 2020-08-13 MED ORDER — FENTANYL CITRATE (PF) 100 MCG/2ML IJ SOLN
INTRAMUSCULAR | Status: DC | PRN
  Administered 2020-08-13: 50 ug via INTRAVENOUS

## 2020-08-13 MED ORDER — SUGAMMADEX SODIUM 500 MG/5ML IV SOLN: INTRAVENOUS | Status: DC | PRN

## 2020-08-13 MED ORDER — ACETAMINOPHEN 500 MG PO TABS
1000.0000 mg | ORAL_TABLET | Freq: Once | ORAL | Status: AC
  Administered 2020-08-13: 1000 mg via ORAL

## 2020-08-13 MED ORDER — LIDOCAINE HCL (CARDIAC) PF 100 MG/5ML IV SOSY
PREFILLED_SYRINGE | INTRAVENOUS | Status: DC | PRN
  Administered 2020-08-13: 60 mg via INTRAVENOUS

## 2020-08-13 MED ORDER — IOHEXOL 300 MG/ML  SOLN
INTRAMUSCULAR | Status: DC | PRN
  Administered 2020-08-13: 13 mL via URETHRAL

## 2020-08-13 MED ORDER — ONDANSETRON HCL 4 MG/2ML IJ SOLN
INTRAMUSCULAR | Status: AC
  Filled 2020-08-13: qty 4

## 2020-08-13 MED ORDER — HYDROCODONE-ACETAMINOPHEN 5-325 MG PO TABS: 1.0000 | ORAL_TABLET | Freq: Four times a day (QID) | ORAL | 0 refills | Status: DC | PRN

## 2020-08-13 MED ORDER — FENTANYL CITRATE (PF) 100 MCG/2ML IJ SOLN
INTRAMUSCULAR | Status: AC
  Filled 2020-08-13: qty 2

## 2020-08-13 MED ORDER — LACTATED RINGERS IV SOLN: INTRAVENOUS | Status: DC

## 2020-08-13 MED ORDER — SUGAMMADEX SODIUM 500 MG/5ML IV SOLN
INTRAVENOUS | Status: DC | PRN
  Administered 2020-08-13: 500 mg via INTRAVENOUS

## 2020-08-13 MED ORDER — FENTANYL CITRATE (PF) 100 MCG/2ML IJ SOLN
25.0000 ug | INTRAMUSCULAR | Status: DC | PRN
  Administered 2020-08-13 (×4): 25 ug via INTRAVENOUS

## 2020-08-13 MED ORDER — LIDOCAINE 2% (20 MG/ML) 5 ML SYRINGE
INTRAMUSCULAR | Status: AC
  Filled 2020-08-13: qty 20

## 2020-08-13 MED ORDER — GEMCITABINE CHEMO FOR BLADDER INSTILLATION 2000 MG
2000.0000 mg | Freq: Once | INTRAVENOUS | Status: AC
  Administered 2020-08-13: 2000 mg via INTRAVESICAL
  Filled 2020-08-13: qty 2000

## 2020-08-13 MED ORDER — ROCURONIUM BROMIDE 100 MG/10ML IV SOLN
INTRAVENOUS | Status: DC | PRN
  Administered 2020-08-13: 70 mg via INTRAVENOUS

## 2020-08-13 MED ORDER — ONDANSETRON HCL 4 MG/2ML IJ SOLN
INTRAMUSCULAR | Status: DC | PRN
  Administered 2020-08-13: 4 mg via INTRAVENOUS

## 2020-08-13 MED ORDER — OXYCODONE HCL 5 MG PO TABS
5.0000 mg | ORAL_TABLET | Freq: Once | ORAL | Status: DC | PRN
Start: 2020-08-13 — End: 2020-08-13

## 2020-08-13 MED ORDER — SUGAMMADEX SODIUM 500 MG/5ML IV SOLN
INTRAVENOUS | Status: AC
  Filled 2020-08-13: qty 5

## 2020-08-13 MED ORDER — ONDANSETRON HCL 4 MG/2ML IJ SOLN: 4.0000 mg | Freq: Once | INTRAMUSCULAR | Status: DC | PRN

## 2020-08-13 MED ORDER — PHENYLEPHRINE HCL (PRESSORS) 10 MG/ML IV SOLN
INTRAVENOUS | Status: DC | PRN
  Administered 2020-08-13: 80 ug via INTRAVENOUS

## 2020-08-13 MED ORDER — CLINDAMYCIN PHOSPHATE 900 MG/50ML IV SOLN
INTRAVENOUS | Status: AC
  Filled 2020-08-13: qty 50

## 2020-08-13 MED ORDER — DEXAMETHASONE SODIUM PHOSPHATE 10 MG/ML IJ SOLN
INTRAMUSCULAR | Status: AC
  Filled 2020-08-13: qty 2

## 2020-08-13 MED ORDER — DEXAMETHASONE SODIUM PHOSPHATE 4 MG/ML IJ SOLN
INTRAMUSCULAR | Status: DC | PRN
  Administered 2020-08-13: 8 mg via INTRAVENOUS

## 2020-08-13 MED ORDER — CLINDAMYCIN PHOSPHATE 900 MG/50ML IV SOLN
900.0000 mg | Freq: Once | INTRAVENOUS | Status: AC
  Administered 2020-08-13: 900 mg via INTRAVENOUS

## 2020-08-13 MED ORDER — OXYCODONE HCL 5 MG/5ML PO SOLN
5.0000 mg | Freq: Once | ORAL | Status: DC | PRN
Start: 2020-08-13 — End: 2020-08-13

## 2020-08-13 SURGICAL SUPPLY — 40 items
BAG DRAIN URO-CYSTO SKYTR STRL (DRAIN) ×3 IMPLANT
BAG DRN RND TRDRP ANRFLXCHMBR (UROLOGICAL SUPPLIES)
BAG DRN UROCATH (DRAIN) ×2
BAG URINE DRAIN 2000ML AR STRL (UROLOGICAL SUPPLIES) IMPLANT
BAG URINE LEG 500ML (DRAIN) IMPLANT
BASKET ZERO TIP NITINOL 2.4FR (BASKET) IMPLANT
BSKT STON RTRVL ZERO TP 2.4FR (BASKET)
CATH FOLEY 2WAY SLVR  5CC 22FR (CATHETERS)
CATH FOLEY 2WAY SLVR 30CC 20FR (CATHETERS) IMPLANT
CATH FOLEY 2WAY SLVR 5CC 22FR (CATHETERS) IMPLANT
CATH SET URETHRAL DILATOR (CATHETERS) IMPLANT
CATH URET 5FR 28IN OPEN ENDED (CATHETERS) ×3 IMPLANT
CLOTH BEACON ORANGE TIMEOUT ST (SAFETY) ×3 IMPLANT
ELECT REM PT RETURN 9FT ADLT (ELECTROSURGICAL) ×3
ELECTRODE REM PT RTRN 9FT ADLT (ELECTROSURGICAL) ×2 IMPLANT
EVACUATOR MICROVAS BLADDER (UROLOGICAL SUPPLIES) IMPLANT
FIBER LASER FLEXIVA 365 (UROLOGICAL SUPPLIES) IMPLANT
GLOVE SURG ENC MOIS LTX SZ7 (GLOVE) ×4 IMPLANT
GLOVE SURG UNDER POLY LF SZ7.5 (GLOVE) ×1 IMPLANT
GOWN STRL REUS W/ TWL XL LVL3 (GOWN DISPOSABLE) IMPLANT
GOWN STRL REUS W/TWL LRG LVL3 (GOWN DISPOSABLE) ×3 IMPLANT
GOWN STRL REUS W/TWL XL LVL3 (GOWN DISPOSABLE) ×3
GUIDEWIRE STR DUAL SENSOR (WIRE) ×3 IMPLANT
GUIDEWIRE ZIPWRE .038 STRAIGHT (WIRE) IMPLANT
IV NS 1000ML (IV SOLUTION) ×3
IV NS 1000ML BAXH (IV SOLUTION) ×2 IMPLANT
IV NS IRRIG 3000ML ARTHROMATIC (IV SOLUTION) ×3 IMPLANT
KIT TURNOVER CYSTO (KITS) ×3 IMPLANT
LOOP CUT BIPOLAR 24F LRG (ELECTROSURGICAL) ×1 IMPLANT
MANIFOLD NEPTUNE II (INSTRUMENTS) ×3 IMPLANT
NS IRRIG 500ML POUR BTL (IV SOLUTION) ×3 IMPLANT
PACK CYSTO (CUSTOM PROCEDURE TRAY) ×3 IMPLANT
SYR 10ML LL (SYRINGE) ×3 IMPLANT
SYR TOOMEY IRRIG 70ML (MISCELLANEOUS)
SYRINGE TOOMEY IRRIG 70ML (MISCELLANEOUS) IMPLANT
TRACTIP FLEXIVA PULS ID 200XHI (Laser) IMPLANT
TRACTIP FLEXIVA PULSE ID 200 (Laser)
TUBE CONNECTING 12X1/4 (SUCTIONS) ×3 IMPLANT
TUBE FEEDING 8FR 16IN STR KANG (MISCELLANEOUS) IMPLANT
TUBING UROLOGY SET (TUBING) ×3 IMPLANT

## 2020-08-13 NOTE — Anesthesia Preprocedure Evaluation (Addendum)
Anesthesia Evaluation  Patient identified by MRN, date of birth, ID band Patient awake    Reviewed: Allergy & Precautions, NPO status , Patient's Chart, lab work & pertinent test results, reviewed documented beta blocker date and time   Airway Mallampati: I  TM Distance: >3 FB Neck ROM: Full    Dental  (+) Edentulous Upper, Missing, Poor Dentition, Dental Advisory Given,    Pulmonary neg shortness of breath, COPD,  COPD inhaler, Current Smoker and Patient abstained from smoking.,  ILD  53 pack year history, still smoking 6-7 cigg/d, has been smoking since 80yo    + decreased breath sounds      Cardiovascular METS: 5 - 7 Mets hypertension, Pt. on medications and Pt. on home beta blockers (-) angina+ CAD, + Cardiac Stents (DES to RCA 2015- plavix) and + Peripheral Vascular Disease  Normal cardiovascular exam Rhythm:Regular Rate:Normal  AAA s/p TEVAR 06/21/20- Interval bifurcated infrarenal stent graft placement with no endoleak, native sac diameter 6.2 cm (previously 6.1)  Stress test 06/05/19:  Lexiscan stress without ST T Wave changes  Myovue scan with normal perfusion, minimal apical thinning  LVEF calculated at 49% though visual estimate suggests LVEF is greater  This is a low risk study.     Neuro/Psych PSYCHIATRIC DISORDERS Anxiety negative neurological ROS     GI/Hepatic negative GI ROS, Neg liver ROS,   Endo/Other  negative endocrine ROS  Renal/GU negative Renal ROS Bladder dysfunction (bladder mass)      Musculoskeletal  (+) Arthritis , Osteoarthritis,    Abdominal   Peds  Hematology negative hematology ROS (+)   Anesthesia Other Findings   Reproductive/Obstetrics negative OB ROS                          Anesthesia Physical Anesthesia Plan  ASA: III  Anesthesia Plan: General   Post-op Pain Management:    Induction: Intravenous  PONV Risk Score and Plan: 2 and Ondansetron,  Dexamethasone and Treatment may vary due to age or medical condition  Airway Management Planned: Oral ETT  Additional Equipment: None  Intra-op Plan:   Post-operative Plan: Extubation in OR  Informed Consent: I have reviewed the patients History and Physical, chart, labs and discussed the procedure including the risks, benefits and alternatives for the proposed anesthesia with the patient or authorized representative who has indicated his/her understanding and acceptance.     Dental advisory given  Plan Discussed with: CRNA  Anesthesia Plan Comments:         Anesthesia Quick Evaluation

## 2020-08-13 NOTE — Anesthesia Postprocedure Evaluation (Signed)
Anesthesia Post Note  Patient: Connor Cuevas  Procedure(s) Performed: TRANSURETHRAL RESECTION OF BLADDER TUMOR (TURBT) WITH POSTOPERATIVE INSTILLATION OF GEMCITABINE (N/A ) CYSTOSCOPY WITH BILATERAL RETROGRADE PYELOGRAM (Bilateral Ureter)     Patient location during evaluation: PACU Anesthesia Type: General Level of consciousness: awake and alert, oriented and patient cooperative Pain management: pain level controlled Vital Signs Assessment: post-procedure vital signs reviewed and stable Respiratory status: spontaneous breathing, nonlabored ventilation and respiratory function stable Cardiovascular status: blood pressure returned to baseline and stable Postop Assessment: no apparent nausea or vomiting Anesthetic complications: no   No complications documented.  Last Vitals:  Vitals:   08/13/20 1230 08/13/20 1245  BP: (!) 167/102 (!) 180/94  Pulse: 65 70  Resp: (!) 23 19  Temp:    SpO2: 100% 99%    Last Pain:  Vitals:   08/13/20 1245  PainSc: [place]

## 2020-08-13 NOTE — Anesthesia Procedure Notes (Signed)
Procedure Name: Intubation Date/Time: 08/13/2020 11:00 AM Performed by: Georgeanne Nim, CRNA Pre-anesthesia Checklist: Patient identified, Emergency Drugs available, Suction available, Patient being monitored and Timeout performed Patient Re-evaluated:Patient Re-evaluated prior to induction Oxygen Delivery Method: Circle system utilized Preoxygenation: Pre-oxygenation with 100% oxygen Induction Type: IV induction Ventilation: Mask ventilation without difficulty Laryngoscope Size: Mac and 4 Grade View: Grade I Tube type: Oral Tube size: 7.0 mm Airway Equipment and Method: Stylet Placement Confirmation: ETT inserted through vocal cords under direct vision,  positive ETCO2,  CO2 detector and breath sounds checked- equal and bilateral Secured at: 21 cm Tube secured with: Tape Dental Injury: Teeth and Oropharynx as per pre-operative assessment

## 2020-08-13 NOTE — H&P (Signed)
Office Visit Report     07/07/2020   --------------------------------------------------------------------------------   Connor Cuevas  MRN: [id number]  DOB: March 27, 1941, 80 year old Male  SSN: [id number]   PRIMARY CARE:  [name], MD  REFERRING:  Kathie Rhodes, MD  PROVIDER:  Rexene Alberts, M.D.  LOCATION:  [facility] [phone]     --------------------------------------------------------------------------------   CC/HPI: Connor Cuevas is a 80 year old male seen in consultation today for a bladder mass, right renal mass, left complex renal cyst, left renal simple cyst, bilateral nonobstructing stones.   He recently underwent CTA of the abdomen and pelvis which revealed a infrarenal abdominal aortic aneurysm measuring 5.7 x 5.4 cm. He then underwent endovascular repair of this on 06/21/2020 by Dr. Carlis Abbott. He states he is recovering well from this procedure.   1. Bladder mass: Incidentally identified on CTA 06/10/2020 was a 1.3 x 0.7 cm bladder mass at the left posterior lateral bladder near the left ureterovesical junction. Patient does endorse a history of intermittent gross hematuria. He is a former smoker with approximately 53-pack-year history and quit in 03/2018. He denies family history of bladder malignancy.   2. Right kidney mass: Also incidentally identified on CTA 06/21/2020 was a solid enhancing exophytic mass in the lateral aspect of the mid right kidney measuring 1.7 x 1.2 cm suspicious for malignancy. There are also 2 additional low-density lesions in the mid to lower left kidney measuring 1.6 and 1.1 cm found to be complex cysts.   3. Ischemic mid left kidney: He was also found to have focal thinning of the posterior medial cortex of the mid left kidney likely due to prior ischemic inflammatory insult.   4. Bilateral renal stones: He also has bilateral stones which are nonobstructing. He has had multiple bilateral renal calculi dating back to 2013. He does have a  history of ESWL and ureteroscopy for ureteral stones in the past. ESWL was in 2002. He underwent right ureteroscopy in 2013. He has not had any stone procedures since that time.   5. Low back pain: He does complain of chronic right lower back pain. This does not appear to be flank pain. It does not appear to be renal colic.   6. Prostate cancer screening: His last PSA and 10/2015 was normal at 2.1. He denies a family history of prostate cancer.   7/8: GE junction thickening/transverse colon thickening: Also incidentally noticed on CTA 06/10/2020 is a masslike thickening of the gastroesophageal junction as well as annular thickening of the mid transverse colon.   He does have a history of a RCA stent approximately 6 years ago and is on Plavix.     ALLERGIES: Cefaclor CAPS Chantix Macrobid CAPS Macrodantin CAPS NSAIDs    MEDICATIONS: Aspirin 81 mg tablet,chewable  Plavix 75 mg tablet  Alphagan P 0.1 % drops  Atenolol 25 mg tablet  Atorvastatin Calcium 80 mg tablet  Co Q-10 100 mg capsule  Dorzolamide-Timolol 22.3 mg-6.8 mg/ml drops  Gabapentin 100 mg capsule  Hydrocodone-Acetaminophen 5 mg-325 mg tablet 1 tablet PO Q 6 H PRN  Icaps Areds  Latanoprost 0.005 % drops  Methocarbamol 500 mg tablet  Nitroglycerin 0.4 mg tablet, sublingual  Omega-3 Krill Oil  Proair Hfa 90 mcg hfa aerosol with adapter  Pseudoephedrine Er 120 mg tablet, extended release  Spiriva 18 mcg capsule, with inhalation device  Trazodone Hcl 100 mg tablet     GU PSH: Cystoscopy And Treatment - 2013 ESWL - 2013, 2008 Rpr Umbil Hern; Reduc <  5 Yr - 2013 Ureteroscopic laser litho, Right - 2020 Ureteroscopic stone removal - 2013, 2013       [facility] Notes: Cath Stent Placement, Cystoscopy With Removal Of Ureteral Calculus Right, Lithotripsy, Umbilical Hernia Repair, Cystoscopy With Ureteroscopy With Manipulation Of Calculus, Cystoscopy With Ureteroscopy With Manipulation Of Calculus, Inguinal Hernia Repair,  Lithotripsy, Hernia Repair   Triple A Duplex w/ 3 stent - June 21, 2020   NON-GU PSH: Hernia Repair - 2008     GU PMH: Renal calculus - 07/23/2018, - 2020, - 2020 (Stable), Bilateral, He has bilateral renal calculi that are nonobstructing., - 02/26/2018, - 2017, Bilateral kidney stones, - 2017 Flank Pain - 2020 Renal cyst, Left, I noted what appears to be a simple cyst of the left kidney on his CT scan today. - 02/26/2018 Ureteral calculus, Left, He has a 7 mm right ureteral stone. We discussed medical expulsive therapy which I think has a fairly low probability of success but he has obligations and wanted to try that for the next couple of weeks so he will return for a KUB and we will proceed with ureteroscopy if we his stone has not passed. - 02/26/2018, Calculus of right ureter, - 2014, Calculus of distal right ureter, - 2014 BPH w/o LUTS - 2017 Encounter for Prostate Cancer screening - 2017 Pelvic/perineal pain - 2017, - 2017 Gross hematuria, Gross hematuria - 2017 BPH w/LUTS, Benign localized prostatic hyperplasia with lower urinary tract symptoms (LUTS) - 2015 Chronic Kidney Disease, Chronic Renal Failure - 2014 History of urolithiasis, Nephrolithiasis - 2014 Kidney Failure Unspec, Renal Failure - 2014      PMH Notes: Calculus disease: In 8/02 he was noted to have a "densely calcified" stone in the left ureter by Dr. Reece Agar. At that time he underwent stent placement.  9/02 ESWL 2 with incomplete fragmentation requiring ureteroscopy and laser lithotripsy.  7/13 right ureteroscopy and stone extraction by Dr. Tammi Klippel analysis 9/02: Calcium oxalate monohydrate  Stone analysis 7/13: Calcium oxalate dihydrate and monohydrate.  CT scan 6/17 bilateral renal calculi.       NON-GU PMH: Encounter for general adult medical examination without abnormal findings, Encounter for preventive health examination - 2017 Myocardial Infarction, History of acute myocardial infarction -  2017 Anxiety, Anxiety - 2014 Personal history of other diseases of the digestive system, History of esophageal reflux - 2014 Personal history of other diseases of the nervous system and sense organs, History of glaucoma - 2014, History of glaucoma, - 2014 Personal history of other endocrine, nutritional and metabolic disease, History of hypercholesterolemia - 2014 Thoracic aortic aneurysm, without rupture    FAMILY HISTORY: Family Health Status Number - Runs In Family Father Deceased At [place] ___ - Runs In [place] In Family Hypertension - Mother Mother Deceased At Age 16 from diabetic complicati - Runs In Family Urologic Disorder - Daughter   SOCIAL HISTORY: Marital Status: Married Preferred Language: English; Ethnicity: Not Hispanic Or Latino; Race: White Current Smoking Status: Patient does not smoke anymore. Has not smoked since 04/04/2015. Smoked for 55 years. Smoked 1 pack per day.  Has never drank.  Drinks 4+ caffeinated drinks per day. Patient's occupation is/was Retired.     Notes: Current every day smoker, Tobacco use, Alcohol Use, Caffeine Use, Death In The Family Father, Caffeine Use, Death In The Family Mother, Tobacco Use, Marital History - Currently Married   REVIEW OF SYSTEMS:    GU Review Male:   Patient denies frequent urination, hard  to postpone urination, burning/ pain with urination, get up at night to urinate, leakage of urine, stream starts and stops, trouble starting your stream, have to strain to urinate , erection problems, and penile pain.  Gastrointestinal (Upper):   Patient denies nausea, vomiting, and indigestion/ heartburn.  Gastrointestinal (Lower):   Patient denies diarrhea and constipation.  Constitutional:   Patient denies fever, night sweats, weight loss, and fatigue.  Skin:   Patient denies skin rash/ lesion and itching.  Eyes:   Patient denies blurred vision and double vision.  Ears/ Nose/ Throat:   Patient denies sore throat and  sinus problems.  Hematologic/Lymphatic:   Patient denies swollen glands and easy bruising.  Cardiovascular:   Patient denies leg swelling and chest pains.  Respiratory:   Patient denies cough and shortness of breath.  Endocrine:   Patient denies excessive thirst.  Musculoskeletal:   Patient denies back pain and joint pain.  Neurological:   Patient denies headaches and dizziness.  Psychologic:   Patient denies depression and anxiety.   VITAL SIGNS:      07/07/2020 07:50 AM  Weight 128 lb / 58.06 kg  Height 69 in / 175.26 cm  BP 162/92 mmHg  Pulse 96 /min  Temperature 98.0 F / 36.6 C  BMI 18.9 kg/m   GU PHYSICAL EXAMINATION:    Anus and Perineum: No hemorrhoids. No anal stenosis. No rectal fissure, no anal fissure. No edema, no dimple, no perineal tenderness, no anal tenderness.  Prostate: 50 grams, no nodules  Seminal Vesicles: Nonpalpable.  Sphincter Tone: Normal sphincter. No rectal tenderness. No rectal mass.    MULTI-SYSTEM PHYSICAL EXAMINATION:    Constitutional: Well-nourished. No physical deformities. Normally developed. Good grooming.  Respiratory: No labored breathing, no use of accessory muscles.   Cardiovascular: Normal temperature, normal extremity pulses, no swelling, no varicosities.  Gastrointestinal: No mass, no tenderness, no rigidity, non obese abdomen.     Complexity of Data:  Source Of History:  Patient, Medical Record Summary  Lab Test Review:   PSA  Records Review:   AUA Symptom Score, Previous Patient Records  Urine Test Review:   Urinalysis  X-Ray Review: C.T. Abdomen/Pelvis: Reviewed Films. Reviewed Report. Discussed With Patient.     10/11/15 07/09/13 05/14/06 07/21/02  PSA  Total PSA 2.10  1.59  1.42  1.02     07/21/02  Hormones  Testosterone, Total 1.84    Notes:                     CLINICAL DATA: Abdominal aortic aneurysm, preop planning   EXAM:  CTA ABDOMEN AND PELVIS WITHOUT AND WITH CONTRAST   TECHNIQUE:  Multidetector CT imaging of  the abdomen and pelvis was performed  using the standard protocol during bolus administration of  intravenous contrast. Multiplanar reconstructed images and MIPs were  obtained and reviewed to evaluate the vascular anatomy.   CONTRAST: 75 mL ISOVUE-370 IOPAMIDOL (ISOVUE-370) INJECTION 76%   COMPARISON: CT abdomen pelvis 08/24/2011   CT urogram 07/23/2018   FINDINGS:  VASCULAR   Aorta: Infrarenal abdominal aortic aneurysm measures a maximum of  5.7 x 5.4 cm. When measured in a similar fashion on the prior  examination from 04/22/2018, the infrarenal abdominal aortic  aneurysm measured 4.4 x 4.4 cm.   Celiac: Patent without evidence of aneurysm, dissection, vasculitis  or significant stenosis.   SMA: Mild stenosis at the origin, otherwise patent.   Renals: No significant stenosis of the renal arteries.   IMA: Occluded at  the origin. Opacification of distal branches  indicative retrograde collateral flow.   Inflow: No significant narrowing of the right common or internal  iliac arteries. There is focal non flow limiting dissection of the  distal right common iliac artery best seen on image 79 of series 8  and 134 of series 4. Inflow vessels otherwise unremarkable.   Proximal Outflow: Bilateral common femoral and visualized portions  of the superficial and profunda femoral arteries are patent without  evidence of aneurysm, dissection, vasculitis or significant  stenosis.   Veins: No obvious venous abnormality within the limitations of this  arterial phase study.   Review of the MIP images confirms the above findings.   NON-VASCULAR   Lower chest: No acute abnormality.   Hepatobiliary: Subcentimeter hypodense lesion in the inferior tip of  the left hepatic lobe is unchanged dating back to 08/21/2011 which  indicates a benign etiology, likely a cyst. Liver, gallbladder, and  bile ducts are otherwise unremarkable.   Pancreas: Unremarkable. No pancreatic ductal dilatation  or  surrounding inflammatory changes.   Spleen: Normal in size without focal abnormality.   Adrenals/Urinary Tract: Adrenal glands are unremarkable. Numerous  bilateral nonobstructing renal calculi with the largest located at  the lower pole of the right kidney measuring 9 mm.   Solid enhancing exophytic mass in the lateral cortex of the mid  right kidney measuring 1.7 x 1.2 cm best seen on image 101 of series  8 is suspicious for malignancy.   There are 2 additional low-density lesions in the mid to lower left  kidney measuring 1.6 cm and 1.1 cm. These cannot be characterized as  a simple cyst given its internal density measuring between 25 and 35  Hounsfield units.   Focal thinning of the posteromedial cortex of the mid left kidney  likely due to prior ischemic or inflammatory insult. See kidneys in  ureters otherwise unremarkable.   Area of focal masslike thickening noted in the left posterolateral  bladder near the ureterovesicular junction which measures  approximately 1.3 x 0.7 cm. It is best seen on image 104 of series  10.   Stomach/Bowel: Masslike thickening of the gastroesophageal junction  best seen on image 10 of series 16 measures 2.8 x 1.9 cm. No bowel  dilatation to indicate ileus or obstruction. There is masslike  annular thickening of the mid transverse colon best seen on image  117 of series 4. Correlation with colonoscopy is recommended to  exclude underlying mass. The appendix is normal.   Lymphatic: No enlarged lymph nodes.   Reproductive: Moderately enlarged prostate.   Other: Hernia repair mesh noted in the left inguinal region.   Musculoskeletal: Mild compression deformity of the L4 vertebral body  superior endplate is new since 07/23/2018. Mild degenerative changes  seen throughout the lumbar spine.   IMPRESSION:  VASCULAR   1. Infrarenal abdominal aortic aneurysm measuring up to 5.7 x 5.4 cm  compared to 4.4 x 4.4 cm on 07/23/2018.  2. 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   NON-VASCULAR   1. Solid mass in the upper pole of the right kidney measuring 1.7 x  1.2 cm (image 101/series 8) is suspicious for renal malignancy.  2. 1.6 and 1.1 cm low-density lesion in the lower half of the left  kidney is may represent mildly complex cysts given that there  internal densities measure between 25 and 35 Hounsfield units. They  cannot be characterized as simple cysts on  the current examination.  There best visualized on the delayed phase images. Further  evaluation ultrasound should be considered to identified 3 simple  cyst in the lower half of the left kidney.  3. Multiple nonobstructing bilateral renal calculi, greater on the  right.  4. Masslike thickening of the wall of the gastroesophageal junction  (image 10/series 16) should be further evaluated with upper  endoscopy to exclude malignancy.  5. Annular thickening of the mid transverse colon wall best seen on  image 117 of series 4 should be further evaluated with colonoscopy  to exclude underlying mass.  6. Masslike thickening of the left posterolateral bladder wall  measuring 1.3 x 0.7 cm (image 104/series 10). Correlate with urine  cytology is this could represent a bladder mass. Correlation with  cystoscopy may be needed.    Electronically Signed  By: Miachel Roux M.D.  On: 06/10/2020 10:09   PROCEDURES:         Flexible Cystoscopy - 52000  Risks, benefits, and some of the potential complications of the procedure were discussed at length with the patient including infection, bleeding, voiding discomfort, urinary retention, fever, chills, sepsis, and others. All questions were answered. Informed consent was obtained. Antibiotic prophylaxis was given. Sterile technique and intraurethral analgesia were used.  Meatus:  Normal size. Normal location. Normal condition.  Urethra:  No strictures.  External Sphincter:  Normal.   Verumontanum:  Normal.  Prostate:  Borderline obstructing. Moderate hyperplasia.  Bladder Neck:  Non-obstructing.  Ureteral Orifices:  Normal location. Normal size. Normal shape. Effluxed clear urine.  Bladder:  1.5cm papillary mass on the left lateral wall just proximal to the left UVJ      The lower urinary tract was carefully examined. The procedure was well-tolerated and without complications. Antibiotic instructions were given. Instructions were given to call the office immediately for bloody urine, difficulty urinating, urinary retention, painful or frequent urination, fever, chills, nausea, vomiting or other illness. The patient stated that he understood these instructions and would comply with them.         Urinalysis w/Scope Dipstick Dipstick Cont'd Micro  Color: Yellow Bilirubin: Neg mg/dL WBC/hpf: 0 - 5/hpf  Appearance: Slightly Cloudy Ketones: Neg mg/dL RBC/hpf: 10 - 20/hpf  Specific Gravity: 1.015 Blood: 3+ ery/uL Bacteria: Mod (26-50/hpf)  pH: 6.0 Protein: Trace mg/dL Cystals: NS (Not Seen)  Glucose: Neg mg/dL Urobilinogen: 0.2 mg/dL Casts: Hyaline    Nitrites: Neg Trichomonas: Not Present    Leukocyte Esterase: Trace leu/uL Mucous: Not Present      Epithelial Cells: 0 - 5/hpf      Yeast: NS (Not Seen)      Sperm: Not Present    ASSESSMENT:      ICD-10 Details  1 GU:   BPH w/LUTS - N40.1   2   Encounter for Prostate Cancer screening - Z12.5   3   Right renal neoplasm - D49.511   4   Bladder tumor/neoplasm - D41.4   5   Renal cyst - N28.1   6   Renal calculus - N20.0    PLAN:           Orders Labs PSA  X-Rays: MRI Abdomen With and Without I.V. Contrast. No Oral Contrast - Renal mass protocol          Schedule         Document Letter(s):  Created for Patient: Clinical Summary         Notes:    #1. Bladder mass:  -Cystoscopy  today confirms a 1.5 cm papillary mass on the left lateral wall just proximal to the left ureterovesical junction. I recommend  taking him to the OR for cystoscopy, TURBT, instillation of gemcitabine. Discussed risks and benefits. He will need to hold Plavix for approximately 5 days prior to procedure. Will obtain cardiac clearance in order to do so.   2. Right renal mass:  -CTA 06/10/2020 with 1.7 x 1.2 cm right renal mass  -Also present are 2 additional low-density lesions in the mid to lower left kidney measuring 1.6 and 1.1 cm.  -We discussed that we need to obtain further renal mass protocol imaging.  -Ordered MRI of the abdomen with and without contrast  -We discussed that small renal mass usually involves robotic assisted partial nephrectomy. We also discussed other options including ablation therapy.  -He will follow-up after obtaining MRI and after undergoing resection of bladder tumor to further discuss.   3. Left renal complex cyst: Seen on CTA 06/10/2020. Obtaining MRI of the abdomen with and without contrast to further evaluate. He will follow-up afterwards to review.   4. Bilateral renal stones:  -He has been present since 2013. He does have a history of ESWL and ureteroscopy for a ureteral stone.  -After treating for malignancies as above, we will consider treating his stones with either ESWL, ureteroscopy or PCNL.   5. Prostate cancer screening: PSA 10/2015 was normal at 2.1. DRE today 40 g, no nodules. Obtain PSA and will notify of result.   6. Esophageal mass: Seen on CTA 06/10/2020 he will undergo further evaluation with upper endoscopy per GI   7. Masslike thickening of transverse mid colon: He will undergo further evaluation with colonoscopy per GI.   CC: Tally Joe, MD    Signed by Jettie Pagan, M.D. on 07/09/20 at 3:38 PM (EDT)  Urology Preoperative H&P   Chief Complaint: Bladder mass  History of Present Illness: Connor Cuevas is a 80 y.o. male with a bladder mass here for TURBT, possible b/l RPG, possible left stent, and intravesical gemcitabine instillation. Denies fevers or chills.      Past Medical History:  Diagnosis Date  . AAA (abdominal aortic aneurysm) (HCC) 04/16/2018   4.3 cm, noted on [place]   . Acute kidney failure (HCC) 2005 approx   after ureteroscopy, hospitalized for about 10 days afterwards  . Anxiety   . Arthritis    Hands, wrist, elbow, back and spine oa  . Bladder mass   . Blind right eye since 07-2019  . Bulging lumbar disc    L4-L5 degeneration, Chronic back pain  . CAD (coronary artery disease)    a. 01/09/14: [place] s/p DES to RCA   . COPD (chronic obstructive pulmonary disease) (HCC)    wheezing  . Dyspnea    with exertion  . Glaucoma of both eyes   . Hepatic cyst 2013   Several tiny hepatic cysts , noted on CT  . History of kidney stones    small kidney stones in right kidney  . Hyperlipidemia   . Interstitial lung disease (HCC)    managed by pcp dr dsvid swayne  . Pneumonia yrs ago  . Pulmonary nodule, right 08/02/2016   a. Right upper lobe  . Right ureteral stone   . Seasonal allergies   . 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  . 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 Aortosclerosis, bilateral - mild with calcification. Per Dr. Azucena Cecil at  [facility]   . Tobacco abuse   . Wears dentures    upper   . Wears glasses     Past Surgical History:  Procedure Laterality Date  . ABDOMINAL AORTIC ENDOVASCULAR STENT GRAFT  06/21/2020   Procedure: ABDOMINAL AORTIC ENDOVASCULAR STENT GRAFT;  Surgeon: Marty Heck, MD;  Location: [place];  Service: Vascular;;  . CARDIAC CATHETERIZATION  2017   right carotid stent  . CATARACT EXTRACTION W/ INTRAOCULAR LENS  IMPLANT, BILATERAL  1997 (APPROX)  . CATARACT EXTRACTION W/PHACO Left 03/31/2020   Procedure: CATARACT EXTRACTION PHACO AND INTRAOCULAR LENS PLACEMENT (IOC) LEFT 5.51 01:16.1 7.2%;  Surgeon: Leandrew Koyanagi, MD;  Location: [place];  Service: Ophthalmology;  Laterality: Left;  . COLONOSCOPY  2017  .  CYSTOSCOPY/RETROGRADE/URETEROSCOPY/STONE EXTRACTION WITH BASKET  10/17/2011   Procedure: CYSTOSCOPY/RETROGRADE/URETEROSCOPY/STONE EXTRACTION WITH BASKET;  Surgeon: Hanley Ben, MD;  Location: [place];  Service: Urology;  Laterality: Right;  . CYSTOSCOPY/URETEROSCOPY/HOLMIUM LASER/STENT PLACEMENT Right 05/13/2018   Procedure: CYSTOSCOPY/RETROGRADE/URETEROSCOPY/HOLMIUM LASER;  Surgeon: Kathie Rhodes, MD;  Location: [facility];  Service: Urology;  Laterality: Right;  . HERNIA REPAIR  15 yrs ago   bil inguinal  . LAPAROSCOPIC LEFT INGUINAL (OBTURATOR) HERNIA AND UMBILICAL HERNIA REPAIR  01-13-2011  . LEFT HEART CATHETERIZATION WITH CORONARY ANGIOGRAM N/A 01/09/2014   Procedure: LEFT HEART CATHETERIZATION WITH CORONARY ANGIOGRAM;  Surgeon: Josue Hector, MD;  Location: [facility] C[facility] LAB;  Service: Cardiovascular;  Laterality: N/A;  . LEFT URETEROSCOPIC STONE EXTRACTION  11-30-2000   W/ PREVIOUS ESWL AND URETERAL STENT PLACEMENT  . PHOTOCOAGULATION Right 12/10/2019   Procedure: TRANSCLEARAL DIODE CYCLOPHOTOCOAGULATION RIGHT;  Surgeon: Leandrew Koyanagi, MD;  Location: [place];  Service: Ophthalmology;  Laterality: Right;  . REPAIR RIGHT INGUINAL HERNIA W/ MESH  12-31-2000  . TONSILLECTOMY  age 45  . ULTRASOUND GUIDANCE FOR VASCULAR ACCESS Bilateral 06/21/2020   Procedure: ULTRASOUND GUIDANCE FOR VASCULAR ACCESS;  Surgeon: Marty Heck, MD;  Location: [place];  Service: Vascular;  Laterality: Bilateral;  . URETEROLITHOTOMY  2009    Allergies:  Allergies  Allergen Reactions  . Cefaclor Anaphylaxis, Rash and Swelling    Throat closed  . Nitrofurantoin Anaphylaxis    Right kidney shut down macrobid  . Chantix [Varenicline] Other (See Comments)    nightmares  . Nsaids Other (See Comments)    Due to blood thinner and asa use    Family History  Problem Relation Age of Onset  . Hypertension Mother   . Heart attack Other        uncle  . Stroke  Other   . Stroke Maternal Grandmother   . Stroke Maternal Grandfather   . Stroke Paternal Grandmother   . Stroke Paternal Grandfather     Social History:  reports that he quit smoking about 2 years ago. His smoking use included cigarettes. He has a 53.00 pack-year smoking history. He has never used smokeless tobacco. He reports that he does not drink alcohol and does not use drugs.  ROS: A complete review of systems was performed.  All systems are negative except for pertinent findings as noted.  Physical Exam:  Vital signs in last 24 hours: Temp:  [97.6 F (36.4 C)] 97.6 F (36.4 C) (05/13 0929) Pulse Rate:  [84] 84 (05/13 0929) Resp:  [15] 15 (05/13 0929) BP: (156)/(91) 156/91 (05/13 0929) SpO2:  [100 %] 100 % (05/13 0929) Weight:  [58.7 kg] 58.7 kg (05/13 0929) Constitutional:  Alert and oriented, No acute distress  Cardiovascular: Regular rate and rhythm Respiratory: Normal respiratory effort, Lungs clear bilaterally GI: Abdomen is soft, nontender, nondistended, no abdominal masses GU: No CVA tenderness Lymphatic: No lymphadenopathy Neurologic: Grossly intact, no focal deficits Psychiatric: Normal mood and affect  Laboratory Data:  Recent Labs    08/13/20 0921  HGB 16.7  HCT 49.0    Recent Labs    08/13/20 0921  NA 141  K 4.1  CL 105  GLUCOSE 99  BUN 22  CREATININE 1.00     Results for orders placed or performed during the hospital encounter of 08/13/20 (from the past 24 hour(s))  I-STAT, chem 8     Status: None   Collection Time: 08/13/20  9:21 AM  Result Value Ref Range   Sodium 141 135 - 145 mmol/L   Potassium 4.1 3.5 - 5.1 mmol/L   Chloride 105 98 - 111 mmol/L   BUN 22 8 - 23 mg/dL   Creatinine, Ser 1.00 0.61 - 1.24 mg/dL   Glucose, Bld 99 70 - 99 mg/dL   Calcium, Ion 1.37 1.15 - 1.40 mmol/L   TCO2 24 22 - 32 mmol/L   Hemoglobin 16.7 13.0 - 17.0 g/dL   HCT 49.0 39.0 - 52.0 %   Recent Results (from the past 240 hour(s))  SARS CORONAVIRUS 2 (TAT  6-24 HRS) Nasopharyngeal Nasopharyngeal Swab     Status: None   Collection Time: 08/10/20 10:05 AM   Specimen: Nasopharyngeal Swab  Result Value Ref Range Status   SARS Coronavirus 2 NEGATIVE NEGATIVE Final    Comment: (NOTE) SARS-CoV-2 target nucleic acids are NOT DETECTED.  The SARS-CoV-2 RNA is generally detectable in upper and lower respiratory specimens during the acute phase of infection. Negative results do not preclude SARS-CoV-2 infection, do not rule out co-infections with other pathogens, and should not be used as the sole basis for treatment or other patient management decisions. Negative results must be combined with clinical observations, patient history, and epidemiological information. The expected result is Negative.  Fact Sheet for Patients: SugarRoll.be  Fact Sheet for Healthcare Providers: https://www.woods-mathews.com/  This test is not yet approved or cleared by the [place] FDA and  has been authorized for detection and/or diagnosis of SARS-CoV-2 by FDA under an Emergency Use Authorization (EUA). This EUA will remain  in effect (meaning this test can be used) for the duration of the COVID-19 declaration under Se ction 564(b)(1) of the Act, 21 U.S.C. section 360bbb-3(b)(1), unless the authorization is terminated or revoked sooner.  Performed at [facility], [place] [address] [place], [place] [address]     Renal Function: Recent Labs    08/13/20 4627  CREATININE 1.00   Estimated Creatinine Clearance: 50.5 mL/min (by C-G formula based on SCr of 1 mg/dL).  Radiologic Imaging: No results found.  I independently reviewed the above imaging studies.  Assessment and Plan DA AUTHEMENT is a 80 y.o. male with a bladder mass here for TURBT, possible b/l RPG, possible left stent, and intravesical gemcitabine instillation. Denies fevers or chills. Ok to proceed.  Matt R. Shona Pardo MD 08/13/2020, 9:53 AM   [facility] Pager: [phone]

## 2020-08-13 NOTE — Transfer of Care (Signed)
Immediate Anesthesia Transfer of Care Note  Patient: Connor Cuevas  Procedure(s) Performed: TRANSURETHRAL RESECTION OF BLADDER TUMOR (TURBT) WITH POSTOPERATIVE INSTILLATION OF GEMCITABINE (N/A ) CYSTOSCOPY WITH BILATERAL RETROGRADE PYELOGRAM (Bilateral Ureter)  Patient Location: PACU  Anesthesia Type:General  Level of Consciousness: awake, alert , oriented and patient cooperative  Airway & Oxygen Therapy: Patient Spontanous Breathing and Patient connected to nasal cannula oxygen  Post-op Assessment: Report given to RN and Post -op Vital signs reviewed and stable  Post vital signs: Reviewed and stable  Last Vitals:  Vitals Value Taken Time  BP    Temp    Pulse 69 08/13/20 1147  Resp 15 08/13/20 1147  SpO2 100 % 08/13/20 1147  Vitals shown include unvalidated device data.  Last Pain:  Vitals:   08/13/20 0929  PainSc: 4       Patients Stated Pain Goal: 4 (50/09/38 1829)  Complications: No complications documented.

## 2020-08-13 NOTE — Discharge Instructions (Signed)
Activity:  You are encouraged to ambulate frequently (about every hour during waking hours) to help prevent blood clots from forming in your legs or lungs.     Diet: You should advance your diet as instructed by your physician.  It will be normal to have some bloating, nausea, and abdominal discomfort intermittently.   Prescriptions:  You will be provided a prescription for pain medication to take as needed.  If your pain is not severe enough to require the prescription pain medication, you may take extra strength Tylenol instead which will have less side effects.  You should also take a prescribed stool softener to avoid straining with bowel movements as the prescription pain medication may constipate you.   What to call us about: You should call the office [phone]) if you develop fever > 101 or develop persistent vomiting. Activity:  You are encouraged to ambulate frequently (about every hour during waking hours) to help prevent blood clots from forming in your legs or lungs.        Transurethral Resection of Bladder Tumor, Care After This sheet gives you information about how to care for yourself after your procedure. Your health care provider may also give you more specific instructions. If you have problems or questions, contact your health care provider. What can I expect after the procedure? After the procedure, it is common to have:  A small amount of blood in your urine for up to 2 weeks.  Soreness or mild pain from your catheter. After your catheter is removed, you may have mild soreness, especially when urinating.  Pain in your lower abdomen. Follow these instructions at home: Medicines  Take over-the-counter and prescription medicines only as told by your health care provider.  If you were prescribed an antibiotic medicine, take it as told by your health care provider. Do not stop taking the antibiotic even if you start to feel better.  Do not drive for 24 hours if  you were given a sedative during your procedure.  Ask your health care provider if the medicine prescribed to you: ? Requires you to avoid driving or using heavy machinery. ? Can cause constipation. You may need to take these actions to prevent or treat constipation:  Take over-the-counter or prescription medicines.  Eat foods that are high in fiber, such as beans, whole grains, and fresh fruits and vegetables.  Limit foods that are high in fat and processed sugars, such as fried or sweet foods.   Activity  Return to your normal activities as told by your health care provider. Ask your health care provider what activities are safe for you.  Do not lift anything that is heavier than 10 lb (4.5 kg), or the limit that you are told, until your health care provider says that it is safe.  Avoid intense physical activity for as long as told by your health care provider.  Rest as told by your health care provider.  Avoid sitting for a long time without moving. Get up to take short walks every 1-2 hours. This is important to improve blood flow and breathing. Ask for help if you feel weak or unsteady. General instructions  Do not drink alcohol for as long as told by your health care provider. This is especially important if you are taking prescription pain medicines.  Do not take baths, swim, or use a hot tub until your health care provider approves. Ask your health care provider if you may take showers. You may only be  allowed to take sponge baths.  If you have a catheter, follow instructions from your health care provider about caring for your catheter and your drainage bag.  Drink enough fluid to keep your urine pale yellow.  Wear compression stockings as told by your health care provider. These stockings help to prevent blood clots and reduce swelling in your legs.  Keep all follow-up visits as told by your health care provider. This is important. ? You will need to be followed closely  with regular checks of your bladder and urethra (cystoscopies) to make sure that the cancer does not come back.   Contact a health care provider if:  You have pain that gets worse or does not improve with medicine.  You have blood in your urine for more than 2 weeks.  You have cloudy or bad-smelling urine.  You become constipated. Signs of constipation may include having: ? Fewer than three bowel movements in a week. ? Difficulty having a bowel movement. ? Stools that are dry, hard, or larger than normal.  You have a fever. Get help right away if:  You have: ? Severe pain. ? Bright red blood in your urine. ? Blood clots in your urine. ? A lot of blood in your urine.  Your catheter has been removed and you are not able to urinate.  You have a catheter in place and the catheter is not draining urine. Summary  After your procedure, it is common to have a small amount of blood in your urine, soreness or mild pain from your catheter, and pain in your lower abdomen.  Take over-the-counter and prescription medicines only as told by your health care provider.  Rest as told by your health care provider. Follow your health care provider's instructions about returning to normal activities. Ask what activities are safe for you.  If you have a catheter, follow instructions from your health care provider about caring for your catheter and your drainage bag.  Get help right away if you cannot urinate, you have severe pain, or you have bright red blood or blood clots in your urine. This information is not intended to replace advice given to you by your health care provider. Make sure you discuss any questions you have with your health care provider. Document Revised: 10/18/2017 Document Reviewed: 10/18/2017 Elsevier Patient Education  2021 [place] Instructions  Activity: Get plenty of rest for the remainder of the day. A responsible individual must  stay with you for 24 hours following the procedure.  For the next 24 hours, DO NOT: -Drive a car -Paediatric nurse -Drink alcoholic beverages -Take any medication unless instructed by your physician -Make any legal decisions or sign important papers.  Meals: Start with liquid foods such as gelatin or soup. Progress to regular foods as tolerated. Avoid greasy, spicy, heavy foods. If nausea and/or vomiting occur, drink only clear liquids until the nausea and/or vomiting subsides. Call your physician if vomiting continues.  Special Instructions/Symptoms: Your throat may feel dry or sore from the anesthesia or the breathing tube placed in your throat during surgery. If this causes discomfort, gargle with warm salt water. The discomfort should disappear within 24 hours.  If you had a scopolamine patch placed behind your ear for the management of post- operative nausea and/or vomiting:  1. The medication in the patch is effective for 72 hours, after which it should be removed.  Wrap patch in a tissue and discard in the  trash. Wash hands thoroughly with soap and water. 2. You may remove the patch earlier than 72 hours if you experience unpleasant side effects which may include dry mouth, dizziness or visual disturbances. 3. Avoid touching the patch. Wash your hands with soap and water after contact with the patch.

## 2020-08-13 NOTE — Op Note (Signed)
Operative Note  Preoperative diagnosis:  1.  Bladder mass  Postoperative diagnosis: 1.  Bladder mass  Procedure(s): 1.  TURBT medium 3cm 2. Instillation of gemcitabine 3.  Bilateral retrograde pyelograms  Surgeon: Connor Alberts, MD  Assistants:  None  Anesthesia:  General  Complications:  None  EBL: Minimal  Specimens: 1.  Left lateral wall bladder tumor ID Type Source Tests Collected by Time Destination  1 : LEFT LATERAL WALL BLADDER TUMOR GU Bladder Tumor SURGICAL PATHOLOGY Connor Lima, MD 08/13/2020 1115     Drains/Catheters: 1.  92 French Foley catheter  Intraoperative findings:   1. Cystoscopy demonstrated approximately 3 cm lesion in the left lateral wall just superior to the left ureteral orifice.  The left ureteral orifice was uninvolved. 2. Right retrograde pyelogram with intraoperative interpretation demonstrated no hydronephrosis, no filling defects, no extravasation of contrast. 3. Left retrograde pyelogram with intraoperative interpretation demonstrated no hydronephrosis, no filling defects, no extravasation of contrast 4. Successful resection to the level of the muscle of the bladder lesion. 5. Excellent hemostasis 6.  Number of tumors:          1 Size of largest tumor:          3 cm    Characteristics of tumors:     Papillary  Yes  Recurrent   No           Primary   Yes  Suspicious for Carcinoma in situ:    No  Clinical tumor stage:          cTa      Bimanual exam under anesthesia:        Yes - no evidence of 3D mass  Visually complete resection:                 Yes  Visualization of detrusor muscle in resection base:        Yes  Visual evaluation for perforation:             Yes (no perforation)   Indication:  Connor Cuevas is a 80 y.o. male with a history of a bladder mass. All the risks, benefits were discussed with the patient to include but not limited to infection, pain, bleeding, damage to adjacent structures, need for further  operations, adverse reaction to anesthesia and death.  Patient understands these risks and agrees to proceed with the operation as planned.    Description of procedure: After informed consent was obtained from the patient, the patient was taken to the operating room. General anesthesia was administered. The patient was placed in dorsal lithotomy position and prepped and draped in usual sterile fashion. Sequential compression devices were applied to lower extremities at the beginning of the case for DVT prophylaxis. Antibiotics were infused prior to surgery start time. A surgical time-out was performed to properly identify the patient, the surgery to be performed, and the surgical site.     We then passed the 21-French rigid cystoscope down the urethra and into the bladder under direct vision without any difficulty. The anterior urethral was normal.  He had a slight narrow caliber of the bulbar urethra. The prostate was mildly-obstructing. The bladder was inspected with 30 and 70 degree lenses. Once in the bladder, systematic evaluation of bladder revealed 3 cm bladder lesion on the left lateral wall just.  To the left ureteral orifice. The ureteral orfices were in orthotopic position and not involved.  I performed bilateral retrograde pyelogram demonstrating findings as above.  We then removed the cystoscope and then passed down the 26 French resectoscope sheath down the urethra into the bladder under direct vision with the visual obturator. The tumor was resected down to muscle. The TUR bladder tumor chips were retrieved from the bladder and each region of resection was passed off the field as a separate specimen.  Hemostasis was achieved using electrocautery. We then proceeded with removing the resectoscope and then placed in a 16 Fr Foley catheter.  The patient tolerated the procedure well with no complication and was awoken from anesthesia and taken to recovery in stable condition.     While in the  post-operative care unit, as a separate procedure, 2000 mg of gemcitabine in 50 mL of water was instilled in the bladder through the catheter and the catheter was plugged. This will remain indwelling for approximately one hour. It will then be drained from the bladder and the catheter will be removed and the patient discharged home.  Plan: Discharge home after Foley catheter removed.  Follow-up with me next week to review pathology.  Matt R. [facility]  Pager: [phone]

## 2020-08-16 ENCOUNTER — Encounter (HOSPITAL_BASED_OUTPATIENT_CLINIC_OR_DEPARTMENT_OTHER): Payer: Self-pay | Admitting: Urology

## 2020-08-18 DIAGNOSIS — R8271 Bacteriuria: Secondary | ICD-10-CM | POA: Diagnosis not present

## 2020-08-18 LAB — SURGICAL PATHOLOGY

## 2020-08-23 DIAGNOSIS — D49511 Neoplasm of unspecified behavior of right kidney: Secondary | ICD-10-CM | POA: Diagnosis not present

## 2020-08-23 DIAGNOSIS — D414 Neoplasm of uncertain behavior of bladder: Secondary | ICD-10-CM | POA: Diagnosis not present

## 2020-08-23 DIAGNOSIS — N200 Calculus of kidney: Secondary | ICD-10-CM | POA: Diagnosis not present

## 2020-08-24 ENCOUNTER — Emergency Department (HOSPITAL_COMMUNITY)
Admission: EM | Admit: 2020-08-24 | Discharge: 2020-08-24 | Disposition: A | Discharge status: Home / Self Care | Payer: Medicare PPO | Attending: Emergency Medicine | Admitting: Emergency Medicine

## 2020-08-24 DIAGNOSIS — Z955 Presence of coronary angioplasty implant and graft: Secondary | ICD-10-CM | POA: Diagnosis not present

## 2020-08-24 DIAGNOSIS — Z7982 Long term (current) use of aspirin: Secondary | ICD-10-CM | POA: Insufficient documentation

## 2020-08-24 DIAGNOSIS — Z87891 Personal history of nicotine dependence: Secondary | ICD-10-CM | POA: Diagnosis not present

## 2020-08-24 DIAGNOSIS — J449 Chronic obstructive pulmonary disease, unspecified: Secondary | ICD-10-CM | POA: Insufficient documentation

## 2020-08-24 DIAGNOSIS — I10 Essential (primary) hypertension: Secondary | ICD-10-CM | POA: Diagnosis not present

## 2020-08-24 DIAGNOSIS — Z79899 Other long term (current) drug therapy: Secondary | ICD-10-CM | POA: Diagnosis not present

## 2020-08-24 DIAGNOSIS — I2510 Atherosclerotic heart disease of native coronary artery without angina pectoris: Secondary | ICD-10-CM | POA: Diagnosis not present

## 2020-08-24 DIAGNOSIS — R339 Retention of urine, unspecified: Secondary | ICD-10-CM | POA: Diagnosis not present

## 2020-08-24 LAB — URINALYSIS, ROUTINE W REFLEX MICROSCOPIC
Bilirubin Urine: NEGATIVE
Glucose, UA: NEGATIVE mg/dL
Ketones, ur: NEGATIVE mg/dL
Leukocytes,Ua: NEGATIVE
Nitrite: POSITIVE — AB
Protein, ur: 30 mg/dL — AB
RBC / HPF: >50 RBC/hpf — ABNORMAL HIGH (ref 0–5)
Specific Gravity, Urine: 1.009 (ref 1.005–1.030)
pH: 7 (ref 5.0–8.0)

## 2020-08-24 NOTE — ED Provider Notes (Signed)
[facility] EMERGENCY DEPARTMENT Provider Note   CSN: [id number] Arrival date & time: 08/24/20  1854   History Chief Complaint  Patient presents with  . Urinary Retention   Connor Cuevas is a 80 y.o. 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  Patient underwent TURBT of a 3cm low grade papillary urothelial carcinoma of the left lateral bladder wall just superior to the left ureteral orifice with postoperative instillation of gemcitabine on 08/13/2020 with Dr. Abner Greenspan. Since the procedure, patient had bladder spasms and decreased urinary frequency for which he was prescribed pyridium. Patient had urinalysis and urine culture resulted on Saturday consistent with a suspected urinary tract infection for which he was prescribed Bactrim which he has been taking as prescribed. Patient states that upon awakening this morning, he had significant abdominal discomfort and attempted to urinate approximately 50 times today with only dribbling of urine. He states that the pain is currently excruciating so he presented to the ED for evaluation.    Past Medical History:  Diagnosis Date  . AAA (abdominal aortic aneurysm) ([place]) 04/16/2018   4.3 cm, noted on [place]   . Acute kidney failure ([place]) 2005 approx   after ureteroscopy, hospitalized for about 10 days afterwards  . Anxiety   . Arthritis    Hands, wrist, elbow, back and spine oa  . Bladder mass   . Blind right eye since 07-2019  . Bulging lumbar disc    L4-L5 degeneration, Chronic back pain  . CAD (coronary artery disease)    a. 01/09/14: [place] s/p DES to RCA   . COPD (chronic obstructive pulmonary disease) (HCC)    wheezing  . Dyspnea    with exertion  . Glaucoma of both eyes   . Hepatic cyst 2013   Several tiny  hepatic cysts , noted on CT  . History of kidney stones    small kidney stones in right kidney  . Hyperlipidemia   . Interstitial lung disease ([place])    managed by pcp dr dsvid swayne  . Pneumonia yrs ago  . Pulmonary nodule, right 08/02/2016   a. Right upper lobe  . Right ureteral stone   . Seasonal allergies   . 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  . 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 Aortosclerosis, bilateral - mild with calcification. Per Dr. Moreen Fowler at [facility]   . Tobacco abuse   . Wears dentures    upper   . Wears glasses    Patient Active Problem List   Diagnosis Date Noted  . Unstable angina ([place]) 01/10/2014  . Hyperlipidemia   . CAD (coronary artery disease)   . COPD (chronic obstructive pulmonary disease) ([place])   . AAA (abdominal aortic aneurysm) ([place])   . 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 Tobacco abuse   . 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 Pulmonary nodule 09/28/2008  . ANXIETY 09/25/2008  . Aortic valve disorder 09/25/2008   Past Surgical History:  Procedure Laterality Date  . ABDOMINAL AORTIC ENDOVASCULAR STENT GRAFT  06/21/2020   Procedure: ABDOMINAL AORTIC ENDOVASCULAR STENT GRAFT;  Surgeon: Marty Heck, MD;  Location: [facility];  Service: Vascular;;  . CARDIAC CATHETERIZATION  2017   right carotid stent  . CATARACT EXTRACTION W/ INTRAOCULAR LENS  IMPLANT, BILATERAL  1997 (APPROX)  . CATARACT EXTRACTION W/PHACO Left 03/31/2020   Procedure: CATARACT EXTRACTION PHACO AND INTRAOCULAR LENS PLACEMENT (IOC) LEFT 5.51 01:16.1 7.2%;  Surgeon: Leandrew Koyanagi, MD;  Location: [facility];  Service: Ophthalmology;  Laterality: Left;  . COLONOSCOPY  2017  . CYSTOSCOPY W/ URETERAL STENT PLACEMENT Bilateral 08/13/2020   Procedure: CYSTOSCOPY WITH BILATERAL RETROGRADE PYELOGRAM;  Surgeon: Janith Lima, MD;  [facility]: [facility];  Service: Urology;  Laterality: Bilateral;  .  CYSTOSCOPY/RETROGRADE/URETEROSCOPY/STONE EXTRACTION WITH BASKET  10/17/2011   Procedure: CYSTOSCOPY/RETROGRADE/URETEROSCOPY/STONE EXTRACTION WITH BASKET;  Surgeon: Hanley Ben, MD;  Location: [facility];  Service: Urology;  Laterality: Right;  . CYSTOSCOPY/URETEROSCOPY/HOLMIUM LASER/STENT PLACEMENT Right 05/13/2018   Procedure: CYSTOSCOPY/RETROGRADE/URETEROSCOPY/HOLMIUM LASER;  Surgeon: Kathie Rhodes, MD;  Location: [facility];  Service: Urology;  Laterality: Right;  . HERNIA REPAIR  15 yrs ago   bil inguinal  . LAPAROSCOPIC LEFT INGUINAL (OBTURATOR) HERNIA AND UMBILICAL HERNIA REPAIR  01-13-2011  . LEFT HEART CATHETERIZATION WITH CORONARY ANGIOGRAM N/A 01/09/2014   Procedure: LEFT HEART CATHETERIZATION WITH CORONARY ANGIOGRAM;  Surgeon: Josue Hector, MD;  Location: [facility] CATH LAB;  Service: Cardiovascular;  Laterality: N/A;  . LEFT URETEROSCOPIC STONE EXTRACTION  11-30-2000   W/ PREVIOUS ESWL AND URETERAL STENT PLACEMENT  . PHOTOCOAGULATION Right 12/10/2019   Procedure: TRANSCLEARAL DIODE CYCLOPHOTOCOAGULATION RIGHT;  Surgeon: Leandrew Koyanagi, MD;  Location: [place] [facility];  Service: Ophthalmology;  Laterality: Right;  . REPAIR RIGHT INGUINAL HERNIA W/ MESH  12-31-2000  . TONSILLECTOMY  age 54  . TRANSURETHRAL RESECTION OF BLADDER TUMOR N/A 08/13/2020   Procedure: TRANSURETHRAL RESECTION OF BLADDER TUMOR (TURBT) WITH POSTOPERATIVE INSTILLATION OF GEMCITABINE;  Surgeon: Janith Lima, MD;  Location: [facility];  Service: Urology;  Laterality: N/A;  GENERAL ANESTHESIA WITH INTUBATED, PARALYSIS  . ULTRASOUND GUIDANCE FOR VASCULAR ACCESS Bilateral 06/21/2020   Procedure: ULTRASOUND GUIDANCE FOR VASCULAR ACCESS;  Surgeon: Marty Heck, MD;  Location: [facility];  Service: Vascular;  Laterality: Bilateral;  . URETEROLITHOTOMY  2009    Family History  Problem Relation Age of Onset  . Hypertension Mother   . Heart attack Other        uncle   . Stroke Other   . Stroke Maternal Grandmother   . Stroke Maternal Grandfather   . Stroke Paternal Grandmother   . Stroke Paternal Grandfather     Social History   Tobacco Use  . Smoking status: Former Smoker    Packs/day: 1.00    Years: 53.00    Pack years: 53.00    Types: Cigarettes    Quit date: 03/11/2018    Years since quitting: 2.4  . Smokeless tobacco: Never Used  Vaping Use  . Vaping Use: Never used  Substance Use Topics  . Alcohol use: No  . Drug use: Never    Home Medications Prior to Admission medications   Medication Sig Start Date End Date Taking? Authorizing Provider  aspirin EC 81 MG EC tablet Take 1 tablet (81 mg total) by mouth daily at 6 (six) AM. Swallow whole. 06/23/20   Rhyne, Hulen Shouts, PA-C  atenolol (TENORMIN) 25 MG tablet Take 1 tablet (25 mg total) by mouth daily. 05/04/20   Josue Hector, MD  atorvastatin (LIPITOR) 80 MG tablet Take 1 tablet (80 mg total) by mouth every morning. 05/04/20   Josue Hector, MD  brimonidine (ALPHAGAN P) 0.1 % SOLN Place 1 drop into both eyes 2 (two) times daily.  [provider]  clopidogrel (PLAVIX) 75 MG tablet Take 1 tablet by mouth once daily Patient taking differently: Take 75 mg by mouth daily. 08/29/19   Josue Hector, MD  Coenzyme Q10 (CO Q 10) 100 MG CAPS Take 100 mg by mouth daily.    [provider]  gabapentin (NEURONTIN) 100 MG capsule Take 1 capsule (100 mg total) by mouth 3 (three) times daily. Patient taking differently: Take 100 mg by mouth as needed. 10/30/19   Magnant, Charles L, PA-C  GARLIC PO Take by mouth daily.    [provider]  HYDROcodone-acetaminophen (NORCO/VICODIN) 5-325 MG tablet Take 1 tablet by mouth every 6 (six) hours as needed for moderate pain. 08/13/20   Janith Lima, MD  latanoprost (XALATAN) 0.005 % ophthalmic solution Place 1 drop into both eyes at bedtime.    [provider]  methocarbamol (ROBAXIN) 500 MG tablet TAKE 1 TABLET BY MOUTH  EVERY 8 HOURS AS NEEDED Patient not taking: No sig reported 08/29/19   Magnant, Charles L, PA-C  Multiple Vitamin (MULTIVITAMIN) tablet Take 1 tablet by mouth daily.    [provider]  Multiple Vitamins-Minerals (ICAPS AREDS FORMULA PO) Take 1 tablet by mouth 2 (two) times daily.    [provider]  nitroGLYCERIN (NITROSTAT) 0.4 MG SL tablet Place 1 tablet (0.4 mg total) under the tongue every 5 (five) minutes as needed for chest pain. 06/12/19   Josue Hector, MD  OMEGA-3 KRILL OIL PO Take 200 mg by mouth daily.    [provider]  PROAIR HFA 108 [phone] Base) MCG/ACT inhaler Inhale 2 puffs into the lungs every 4 (four) hours as needed for wheezing. 09/16/18   [provider]  pseudoephedrine (SUDAFED) 120 MG 12 hr tablet Take 120 mg by mouth daily as needed for congestion.    [provider]  tiotropium (SPIRIVA) 18 MCG inhalation capsule Place 18 mcg into inhaler and inhale every morning.    [provider]  traZODone (DESYREL) 100 MG tablet Take 100 mg by mouth at bedtime.  03/10/15   [provider]  VITAMIN D, CHOLECALCIFEROL, PO Take by mouth daily.    [provider]    Allergies    Cefaclor, Nitrofurantoin, Chantix [varenicline], and Nsaids  Review of Systems   Review of Systems  Constitutional: Negative for chills and fever.  HENT: Negative for ear pain and sore throat.   Eyes: Negative for pain and visual disturbance.  Respiratory: Negative for cough and shortness of breath.   Cardiovascular: Negative for chest pain and palpitations.  Gastrointestinal: Positive for abdominal pain. Negative for vomiting.  Genitourinary: Positive for decreased urine volume, difficulty urinating and dysuria. Negative for hematuria.  Musculoskeletal: Negative for arthralgias and back pain.  Skin: Negative for color change and rash.  Neurological: Negative for seizures and syncope.  All other systems reviewed and are  negative.  Physical Exam Updated Vital Signs BP (!) 153/92   Pulse 91   Temp 98.3 F (36.8 C) (Oral)   Resp 20   SpO2 96%   Physical Exam Vitals and nursing note reviewed.  Constitutional:      General: He is in acute distress.     Appearance: He is well-developed.  HENT:     Head: Normocephalic and atraumatic.  Eyes:     Conjunctiva/sclera: Conjunctivae normal.  Cardiovascular:     Rate and Rhythm: Normal rate and regular rhythm.     Heart sounds: No murmur heard.   Pulmonary:  Effort: Pulmonary effort is normal. No respiratory distress.     Breath sounds: Normal breath sounds.  Abdominal:     General: Bowel sounds are normal.     Palpations: Abdomen is soft.     Tenderness: There is no abdominal tenderness.     Comments: Firm distention of suprapubic region  Genitourinary:    Penis: Normal.      Testes: Normal.  Musculoskeletal:     Cervical back: Neck supple.  Skin:    General: Skin is warm and dry.  Neurological:     Mental Status: He is alert.    ED Results / Procedures / Treatments   Labs (all labs ordered are listed, but only abnormal results are displayed) Labs Reviewed  URINE CULTURE  URINALYSIS, ROUTINE W REFLEX MICROSCOPIC   EKG None  Radiology No results found.  Medications Ordered in ED Medications - No data to display  ED Course  I have reviewed the triage vital signs and the nursing notes.  Pertinent labs & imaging results that were available during my care of the patient were reviewed by me and considered in my medical decision making (see chart for details).    MDM Rules/Calculators/A&P                          Connor Cuevas is a 80 y.o. male with bladder cancer s/p TURBT and chemotherapy on 08/13/2020 who presents with urinary retention and abdominal discomfort. Bladder scan upon arrival demonstrates retention of >900cc of urine. Discussed case with urologist on call with recommendation for placement of 18 French Catheter and  voiding trial in one week. Patient had foley catheter placed with complete resolution of his symptoms and drainage of approximately 900cc of urine. Will obtain urinalysis and urine culture per his report of urinary tract infection treated with Bactrim. Patient informed to call his urologist's office to schedule an appointment with either Dr. Abner Greenspan or one of his nurse practitioners.  Final Clinical Impression(s) / ED Diagnoses Final diagnoses:  Urinary retention   Rx / DC Orders ED Discharge Orders    None       Cato Mulligan, MD 08/24/20 2109    Tegeler, Gwenyth Allegra, MD 08/25/20 2221

## 2020-08-24 NOTE — ED Triage Notes (Signed)
Pt bibems from home due to urinary retention. Pt had a procedure done with chemo 6 days ago on his bladder at [facility]. For the last 24 hours patient has had urinary retention with "excruciating" pain. Pt alert and oriented x4.

## 2020-08-24 NOTE — Discharge Instructions (Addendum)
Connor Cuevas,  It was a pleasure meeting you in the emergency department. You presented for evaluation of decreased urine output and abdominal discomfort found to have urinary retention which resolved after placement of a Foley Catheter. You will need to go home with the Foley and contact your urologist tomorrow morning to schedule an appointment in one week to attempt a voiding trial.  Sincerely, Dr. Paulla Dolly, MD

## 2020-08-26 LAB — URINE CULTURE: Culture: NO GROWTH

## 2020-09-01 DIAGNOSIS — R338 Other retention of urine: Secondary | ICD-10-CM | POA: Diagnosis not present

## 2020-09-08 ENCOUNTER — Telehealth: Payer: Self-pay | Admitting: *Deleted

## 2020-09-08 ENCOUNTER — Other Ambulatory Visit: Payer: Medicare PPO | Admitting: Hospice

## 2020-09-08 ENCOUNTER — Other Ambulatory Visit: Payer: Self-pay

## 2020-09-08 DIAGNOSIS — D494 Neoplasm of unspecified behavior of bladder: Secondary | ICD-10-CM

## 2020-09-08 DIAGNOSIS — Z86010 Personal history of colonic polyps: Secondary | ICD-10-CM | POA: Diagnosis not present

## 2020-09-08 DIAGNOSIS — K219 Gastro-esophageal reflux disease without esophagitis: Secondary | ICD-10-CM | POA: Diagnosis not present

## 2020-09-08 DIAGNOSIS — Z515 Encounter for palliative care: Secondary | ICD-10-CM

## 2020-09-08 DIAGNOSIS — R935 Abnormal findings on diagnostic imaging of other abdominal regions, including retroperitoneum: Secondary | ICD-10-CM | POA: Diagnosis not present

## 2020-09-08 NOTE — Telephone Encounter (Signed)
   Name: Connor Cuevas  DOB: [birth year]  MRN: [id number]   Primary Cardiologist: [name], MD  Chart reviewed as part of pre-operative protocol coverage. Patient was contacted 09/08/2020 in reference to pre-operative risk assessment for pending surgery as outlined below.  JARMARCUS WAMBOLD was last seen on 05/2020 by Dr. [name].  Since that day, RAJINDER MESICK has done well from a CV standpoint. He can achieve greater than 4 METS without anginal symptoms. He recently held Plavix for bladder tumor resection without difficulty.  Therefore, based on ACC/AHA guidelines, the patient would be at acceptable risk for the planned procedure without further cardiovascular testing.   The patient was advised that if he develops new symptoms prior to surgery to contact our office to arrange for a follow-up visit, and he verbalized understanding.  I will route this recommendation to the requesting party via Epic fax function and remove from pre-op pool. Please call with questions.  Kathyrn Drown, NP 09/08/2020, 12:44 PM

## 2020-09-08 NOTE — Progress Notes (Signed)
[facility] Consult Note Telephone: [phone]  Fax: [phone]  PATIENT NAME: Connor Cuevas DOB: [birth year] MRN: [id number]  PRIMARY CARE PROVIDER:   [name], MD [name], MD [address] [place],  [place] [address]  REFERRING PROVIDER: [name], MD [name], MD [place],  [place] [address]  RESPONSIBLE PARTY:   Self [phone] c Spouse is HCPOA [place]    Name Relation Home Work [place] [phone]  [phone]   Connor, Cuevas. Son [phone]  [phone]      Visit is to build trust and highlight Palliative Medicine as specialized medical care for people living with serious illness, aimed at facilitating better quality of life through symptoms relief, assisting with advance care planning and complex medical decision making.  NP called Anderson [place] and left her a voicemail with callback number  RECOMMENDATIONS/PLAN:   CODE STATUS: Patient is a full code  Goals of Care: Goals include to maximize quality of life and symptom management. Visit consisted of counseling and education dealing with the complex and emotionally intense issues of symptom management and palliative care in the setting of serious and potentially life-threatening illness.  Love from family and neighbors Patient to cope.  Palliative care team will continue to support patient, patient's family, and medical team.  Symptom Management/Plan: Bladder CA: Newly diagnosed March 2022.  Tumor removed as planned. Followed by Oncologist at [facility] is to remove the tumor 08/13/2020.  Patient has appointment with gastroenterologist today.  COPD: No COPD exacerbation at this time.  Continue Spiriva, Albuterol.  Avoid irritants.  Slow deep breathing.  Use fan for air circulation. Follow up: Palliative care will continue to follow for complex  medical decision making, advance care planning, and clarification of goals. Return 6 weeks or prn.Encouraged to call provider sooner with any concerns.   PPS: 60%  HOSPICE ELIGIBILITY/DIAGNOSIS: TBD  Chief Complaint: Initial palliative care visit: Bladder CA  HISTORY OF PRESENT ILLNESS:  Connor Cuevas is a 80 y.o. year old male  with multiple medical conditions including newly diagnosed bladder cancer with successful transurethral resection of bladder tumor 08/18/2020, AAA repair, COPD, hypertension, kidney tumor, CAD.  Patient reports he was recently treated for urinary tract infection; resolved at this time.  He denies pain/discomfort.  Obtained from review of EMR, discussion with primary team, caregiver, family and/or Mr. Hershman.  Review and summarization of Epic records shows history from other than patient. Rest of 10 point ROS asked and negative.  Review and summarization of Epic records shows history from other than patient.   Palliative Care was asked to follow this patient o help address complex decision making in the context of advance care planning and goals of care clarification.   PPS: 60%   PERTINENT MEDICATIONS:  Outpatient Encounter Medications as of 09/08/2020  Medication Sig  . aspirin EC 81 MG EC tablet Take 1 tablet (81 mg total) by mouth daily at 6 (six) AM. Swallow whole.  Marland Kitchen atenolol (TENORMIN) 25 MG tablet Take 1 tablet (25 mg total) by mouth daily.  Marland Kitchen atorvastatin (LIPITOR) 80 MG tablet Take 1 tablet (80 mg total) by mouth every morning.  . brimonidine (ALPHAGAN P) 0.1 % SOLN Place 1 drop into both eyes 2 (two) times daily.  . clopidogrel (PLAVIX) 75 MG tablet Take 1 tablet by mouth once daily (  Patient taking differently: Take 75 mg by mouth daily.)  . Coenzyme Q10 (CO Q 10) 100 MG CAPS Take 100 mg by mouth daily.  Marland Kitchen gabapentin (NEURONTIN) 100 MG capsule Take 1 capsule (100 mg total) by mouth 3 (three) times daily. (Patient taking differently: Take 100 mg by  mouth as needed.)  . GARLIC PO Take by mouth daily.  Marland Kitchen HYDROcodone-acetaminophen (NORCO/VICODIN) 5-325 MG tablet Take 1 tablet by mouth every 6 (six) hours as needed for moderate pain.  Marland Kitchen latanoprost (XALATAN) 0.005 % ophthalmic solution Place 1 drop into both eyes at bedtime.  . methocarbamol (ROBAXIN) 500 MG tablet TAKE 1 TABLET BY MOUTH EVERY 8 HOURS AS NEEDED (Patient not taking: No sig reported)  . Multiple Vitamin (MULTIVITAMIN) tablet Take 1 tablet by mouth daily.  . Multiple Vitamins-Minerals (ICAPS AREDS FORMULA PO) Take 1 tablet by mouth 2 (two) times daily.  . nitroGLYCERIN (NITROSTAT) 0.4 MG SL tablet Place 1 tablet (0.4 mg total) under the tongue every 5 (five) minutes as needed for chest pain.  Marland Kitchen OMEGA-3 KRILL OIL PO Take 200 mg by mouth daily.  Marland Kitchen PROAIR HFA 108 (90 Base) MCG/ACT inhaler Inhale 2 puffs into the lungs every 4 (four) hours as needed for wheezing.  . pseudoephedrine (SUDAFED) 120 MG 12 hr tablet Take 120 mg by mouth daily as needed for congestion.  Marland Kitchen tiotropium (SPIRIVA) 18 MCG inhalation capsule Place 18 mcg into inhaler and inhale every morning.  . traZODone (DESYREL) 100 MG tablet Take 100 mg by mouth at bedtime.   Marland Kitchen VITAMIN D, CHOLECALCIFEROL, PO Take by mouth daily.   No facility-administered encounter medications on file as of 09/08/2020.    HOSPICE ELIGIBILITY/DIAGNOSIS: TBD  PAST MEDICAL HISTORY:  Past Medical History:  Diagnosis Date  . AAA (abdominal aortic aneurysm) ([place]) 04/16/2018   4.3 cm, noted on [place]   . Acute kidney failure ([place]) 2005 approx   after ureteroscopy, hospitalized for about 10 days afterwards  . Anxiety   . Arthritis    Hands, wrist, elbow, back and spine oa  . Bladder mass   . Blind right eye since 07-2019  . Bulging lumbar disc    L4-L5 degeneration, Chronic back pain  . CAD (coronary artery disease)    a. 01/09/14: [place] s/p DES to RCA   . COPD (chronic obstructive pulmonary disease) (HCC)    wheezing  . Dyspnea    with  exertion  . Glaucoma of both eyes   . Hepatic cyst 2013   Several tiny hepatic cysts , noted on CT  . History of kidney stones    small kidney stones in right kidney  . Hyperlipidemia   . Interstitial lung disease ([place])    managed by pcp dr dsvid swayne  . Pneumonia yrs ago  . Pulmonary nodule, right 08/02/2016   a. Right upper lobe  . Right ureteral stone   . Seasonal allergies   . 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  . 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 Aortosclerosis, bilateral - mild with calcification. Per Dr. Moreen Fowler [facility]   . Tobacco abuse   . Wears dentures    upper   . Wears glasses      ALLERGIES:  Allergies  Allergen Reactions  . Cefaclor Anaphylaxis, Rash and Swelling    Throat closed  . Nitrofurantoin Anaphylaxis    Right kidney shut down macrobid  . Chantix [Varenicline] Other (See Comments)  nightmares  . Nsaids Other (See Comments)    Due to blood thinner and asa use      I spent 46 minutes providing this consultation; this includes time spent with patient/family, chart review and documentation. More than 50% of the time in this consultation was spent on counseling and coordinating communication   Thank you for the opportunity to participate in the care of MARRIO SCRIBNER Please call our office at [phone] if we can be of additional assistance.  Note: Portions of this note were generated with Lobbyist. Dictation errors may occur despite best attempts at proofreading.  Teodoro Spray, NP

## 2020-09-08 NOTE — Telephone Encounter (Signed)
   [facility] Pre-operative Risk Assessment    Patient Name: Connor Cuevas  DOB: 1941/03/13  MRN: [id number]   [place]: - Please ensure there is not already an duplicate clearance open for this procedure. - Under Visit Info/Reason for Call, type in Other and utilize the format Clearance MM/DD/YY or Clearance TBD. Do not use dashes or single digits. - If request is for dental extraction, please clarify the # of teeth to be extracted. - If the patient is currently at the dentist's office, call Pre-Op APP to address. If the patient is not currently in the dentist office, please route to the Pre-Op pool  Request for surgical clearance:  1. What type of surgery is being performed? COLONOSCOPY/ENDOSCOPY   2. When is this surgery scheduled? TBD (SOMETIME IN October 2022)   3. What type of clearance is required (medical clearance vs. Pharmacy clearance to hold med vs. Both)? MEDICAL  4. Are there any medications that need to be held prior to surgery and how long? PLAVIX    5. Practice name and name of physician performing surgery? [facility]; DR, SCHOOLER   6. What is the office phone number? [phone]   7.   What is the office fax number? [phone]  8.   Anesthesia type (None, local, MAC, general) ? PROPOFOL   Julaine Hua 09/08/2020, 12:34 PM  _________________________________________________________________   (provider comments below)

## 2020-09-15 ENCOUNTER — Other Ambulatory Visit: Payer: Self-pay | Admitting: Cardiovascular Disease

## 2020-09-23 ENCOUNTER — Other Ambulatory Visit: Payer: Self-pay | Admitting: Gastroenterology

## 2020-10-14 DIAGNOSIS — W57XXXA Bitten or stung by nonvenomous insect and other nonvenomous arthropods, initial encounter: Secondary | ICD-10-CM | POA: Diagnosis not present

## 2020-10-14 DIAGNOSIS — S30861A Insect bite (nonvenomous) of abdominal wall, initial encounter: Secondary | ICD-10-CM | POA: Diagnosis not present

## 2020-10-19 DIAGNOSIS — I2510 Atherosclerotic heart disease of native coronary artery without angina pectoris: Secondary | ICD-10-CM | POA: Diagnosis not present

## 2020-10-19 DIAGNOSIS — E782 Mixed hyperlipidemia: Secondary | ICD-10-CM | POA: Diagnosis not present

## 2020-10-19 DIAGNOSIS — Z87442 Personal history of urinary calculi: Secondary | ICD-10-CM | POA: Diagnosis not present

## 2020-10-19 DIAGNOSIS — Z955 Presence of coronary angioplasty implant and graft: Secondary | ICD-10-CM | POA: Diagnosis not present

## 2020-10-19 DIAGNOSIS — J449 Chronic obstructive pulmonary disease, unspecified: Secondary | ICD-10-CM | POA: Diagnosis not present

## 2020-10-19 DIAGNOSIS — M1990 Unspecified osteoarthritis, unspecified site: Secondary | ICD-10-CM | POA: Diagnosis not present

## 2020-10-19 DIAGNOSIS — I700 Atherosclerosis of aorta: Secondary | ICD-10-CM | POA: Diagnosis not present

## 2020-10-19 DIAGNOSIS — F419 Anxiety disorder, unspecified: Secondary | ICD-10-CM | POA: Diagnosis not present

## 2020-10-19 DIAGNOSIS — N2889 Other specified disorders of kidney and ureter: Secondary | ICD-10-CM | POA: Diagnosis not present

## 2020-11-08 ENCOUNTER — Telehealth: Payer: Self-pay | Admitting: Hospice

## 2020-11-08 NOTE — Telephone Encounter (Signed)
NP called patient for scheduled visit; he rescheduled to 8/26 due to he was out of state in [place] visiting with his daughter.

## 2020-11-26 ENCOUNTER — Other Ambulatory Visit: Payer: Medicare PPO | Admitting: Hospice

## 2020-11-26 ENCOUNTER — Other Ambulatory Visit: Payer: Self-pay

## 2020-12-02 DIAGNOSIS — N200 Calculus of kidney: Secondary | ICD-10-CM | POA: Diagnosis not present

## 2020-12-02 DIAGNOSIS — D414 Neoplasm of uncertain behavior of bladder: Secondary | ICD-10-CM | POA: Diagnosis not present

## 2020-12-02 DIAGNOSIS — D49511 Neoplasm of unspecified behavior of right kidney: Secondary | ICD-10-CM | POA: Diagnosis not present

## 2020-12-02 DIAGNOSIS — N281 Cyst of kidney, acquired: Secondary | ICD-10-CM | POA: Diagnosis not present

## 2020-12-10 DIAGNOSIS — J019 Acute sinusitis, unspecified: Secondary | ICD-10-CM | POA: Diagnosis not present

## 2020-12-17 ENCOUNTER — Other Ambulatory Visit: Payer: Self-pay | Admitting: Urology

## 2020-12-17 DIAGNOSIS — D49511 Neoplasm of unspecified behavior of right kidney: Secondary | ICD-10-CM

## 2020-12-21 DIAGNOSIS — M791 Myalgia, unspecified site: Secondary | ICD-10-CM | POA: Diagnosis not present

## 2020-12-21 DIAGNOSIS — R5383 Other fatigue: Secondary | ICD-10-CM | POA: Diagnosis not present

## 2020-12-21 DIAGNOSIS — N2889 Other specified disorders of kidney and ureter: Secondary | ICD-10-CM | POA: Diagnosis not present

## 2020-12-21 DIAGNOSIS — I2510 Atherosclerotic heart disease of native coronary artery without angina pectoris: Secondary | ICD-10-CM | POA: Diagnosis not present

## 2020-12-23 DIAGNOSIS — H401133 Primary open-angle glaucoma, bilateral, severe stage: Secondary | ICD-10-CM | POA: Diagnosis not present

## 2020-12-28 ENCOUNTER — Observation Stay (HOSPITAL_BASED_OUTPATIENT_CLINIC_OR_DEPARTMENT_OTHER): Payer: Medicare PPO

## 2020-12-28 ENCOUNTER — Other Ambulatory Visit: Payer: Self-pay

## 2020-12-28 ENCOUNTER — Emergency Department (HOSPITAL_COMMUNITY): Payer: Medicare PPO

## 2020-12-28 ENCOUNTER — Observation Stay (HOSPITAL_COMMUNITY)
Admission: EM | Admit: 2020-12-28 | Discharge: 2020-12-29 | Disposition: A | Discharge status: Home / Self Care | Payer: Medicare PPO | Attending: Internal Medicine | Admitting: Internal Medicine

## 2020-12-28 ENCOUNTER — Encounter (HOSPITAL_COMMUNITY): Payer: Self-pay | Admitting: Emergency Medicine

## 2020-12-28 DIAGNOSIS — H409 Unspecified glaucoma: Secondary | ICD-10-CM | POA: Diagnosis present

## 2020-12-28 DIAGNOSIS — Z20822 Contact with and (suspected) exposure to covid-19: Secondary | ICD-10-CM | POA: Insufficient documentation

## 2020-12-28 DIAGNOSIS — I200 Unstable angina: Secondary | ICD-10-CM | POA: Diagnosis not present

## 2020-12-28 DIAGNOSIS — Z7982 Long term (current) use of aspirin: Secondary | ICD-10-CM | POA: Insufficient documentation

## 2020-12-28 DIAGNOSIS — R0600 Dyspnea, unspecified: Secondary | ICD-10-CM | POA: Diagnosis not present

## 2020-12-28 DIAGNOSIS — I209 Angina pectoris, unspecified: Secondary | ICD-10-CM | POA: Diagnosis not present

## 2020-12-28 DIAGNOSIS — D494 Neoplasm of unspecified behavior of bladder: Secondary | ICD-10-CM | POA: Diagnosis not present

## 2020-12-28 DIAGNOSIS — R079 Chest pain, unspecified: Secondary | ICD-10-CM | POA: Diagnosis present

## 2020-12-28 DIAGNOSIS — F411 Generalized anxiety disorder: Secondary | ICD-10-CM | POA: Diagnosis present

## 2020-12-28 DIAGNOSIS — I714 Abdominal aortic aneurysm, without rupture, unspecified: Secondary | ICD-10-CM | POA: Diagnosis present

## 2020-12-28 DIAGNOSIS — Z7901 Long term (current) use of anticoagulants: Secondary | ICD-10-CM | POA: Diagnosis not present

## 2020-12-28 DIAGNOSIS — R911 Solitary pulmonary nodule: Secondary | ICD-10-CM | POA: Diagnosis not present

## 2020-12-28 DIAGNOSIS — I10 Essential (primary) hypertension: Secondary | ICD-10-CM | POA: Diagnosis not present

## 2020-12-28 DIAGNOSIS — Z87891 Personal history of nicotine dependence: Secondary | ICD-10-CM | POA: Insufficient documentation

## 2020-12-28 DIAGNOSIS — E782 Mixed hyperlipidemia: Secondary | ICD-10-CM | POA: Diagnosis not present

## 2020-12-28 DIAGNOSIS — J449 Chronic obstructive pulmonary disease, unspecified: Secondary | ICD-10-CM | POA: Diagnosis present

## 2020-12-28 DIAGNOSIS — Z79899 Other long term (current) drug therapy: Secondary | ICD-10-CM | POA: Insufficient documentation

## 2020-12-28 DIAGNOSIS — R0789 Other chest pain: Secondary | ICD-10-CM | POA: Diagnosis not present

## 2020-12-28 DIAGNOSIS — R0609 Other forms of dyspnea: Secondary | ICD-10-CM

## 2020-12-28 DIAGNOSIS — E785 Hyperlipidemia, unspecified: Secondary | ICD-10-CM | POA: Diagnosis present

## 2020-12-28 DIAGNOSIS — R1031 Right lower quadrant pain: Secondary | ICD-10-CM | POA: Insufficient documentation

## 2020-12-28 DIAGNOSIS — I208 Other forms of angina pectoris: Secondary | ICD-10-CM

## 2020-12-28 DIAGNOSIS — I359 Nonrheumatic aortic valve disorder, unspecified: Secondary | ICD-10-CM | POA: Diagnosis present

## 2020-12-28 DIAGNOSIS — I771 Stricture of artery: Secondary | ICD-10-CM

## 2020-12-28 DIAGNOSIS — Z85828 Personal history of other malignant neoplasm of skin: Secondary | ICD-10-CM | POA: Diagnosis not present

## 2020-12-28 DIAGNOSIS — I2510 Atherosclerotic heart disease of native coronary artery without angina pectoris: Secondary | ICD-10-CM | POA: Diagnosis not present

## 2020-12-28 DIAGNOSIS — R0602 Shortness of breath: Secondary | ICD-10-CM | POA: Diagnosis not present

## 2020-12-28 DIAGNOSIS — Z720 Tobacco use: Secondary | ICD-10-CM | POA: Diagnosis present

## 2020-12-28 DIAGNOSIS — J80 Acute respiratory distress syndrome: Secondary | ICD-10-CM | POA: Diagnosis not present

## 2020-12-28 LAB — CBC
HCT: 46.4 % (ref 39.0–52.0)
HCT: 46.9 % (ref 39.0–52.0)
Hemoglobin: 15 g/dL (ref 13.0–17.0)
Hemoglobin: 15.2 g/dL (ref 13.0–17.0)
MCH: 29.4 pg (ref 26.0–34.0)
MCH: 29.4 pg (ref 26.0–34.0)
MCHC: 32.3 g/dL (ref 30.0–36.0)
MCHC: 32.4 g/dL (ref 30.0–36.0)
MCV: 91 fL (ref 80.0–100.0)
MCV: 90.7 fL (ref 80.0–100.0)
Platelets: 322 10*3/uL (ref 150–400)
Platelets: 275 10*3/uL (ref 150–400)
RBC: 5.1 MIL/uL (ref 4.22–5.81)
RBC: 5.17 MIL/uL (ref 4.22–5.81)
RDW: 13.4 % (ref 11.5–15.5)
RDW: 13.5 % (ref 11.5–15.5)
WBC: 13.9 10*3/uL — ABNORMAL HIGH (ref 4.0–10.5)
WBC: 16.8 10*3/uL — ABNORMAL HIGH (ref 4.0–10.5)
nRBC: 0 % (ref 0.0–0.2)
nRBC: 0 % (ref 0.0–0.2)

## 2020-12-28 LAB — URINALYSIS, ROUTINE W REFLEX MICROSCOPIC
Bacteria, UA: NONE SEEN
Bilirubin Urine: NEGATIVE
Glucose, UA: NEGATIVE mg/dL
Ketones, ur: NEGATIVE mg/dL
Nitrite: NEGATIVE
Protein, ur: 100 mg/dL — AB
RBC / HPF: >50 RBC/hpf — ABNORMAL HIGH (ref 0–5)
Specific Gravity, Urine: 1.036 — ABNORMAL HIGH (ref 1.005–1.030)
pH: 5 (ref 5.0–8.0)

## 2020-12-28 LAB — PROTIME-INR
INR: 1 (ref 0.8–1.2)
Prothrombin Time: 13.4 seconds (ref 11.4–15.2)

## 2020-12-28 LAB — BASIC METABOLIC PANEL
Anion gap: 7 (ref 5–15)
BUN: 20 mg/dL (ref 8–23)
CO2: 27 mmol/L (ref 22–32)
Calcium: 9.3 mg/dL (ref 8.9–10.3)
Chloride: 103 mmol/L (ref 98–111)
Creatinine, Ser: 1.13 mg/dL (ref 0.61–1.24)
GFR, Estimated: >60 mL/min (ref >60)
Glucose, Bld: 103 mg/dL — ABNORMAL HIGH (ref 70–99)
Potassium: 4.2 mmol/L (ref 3.5–5.1)
Sodium: 137 mmol/L (ref 135–145)

## 2020-12-28 LAB — ECHOCARDIOGRAM COMPLETE
Calc EF: 51.4 %
Height: 69 in
S' Lateral: 2.9 cm
Single Plane A2C EF: 49.4 %
Single Plane A4C EF: 57.2 %
Weight: 2292.78 oz

## 2020-12-28 LAB — RESP PANEL BY RT-PCR (FLU A&B, COVID) ARPGX2
Influenza A by PCR: NEGATIVE
Influenza B by PCR: NEGATIVE
SARS Coronavirus 2 by RT PCR: NEGATIVE

## 2020-12-28 LAB — CREATININE, SERUM
Creatinine, Ser: 1.04 mg/dL (ref 0.61–1.24)
GFR, Estimated: >60 mL/min (ref >60)

## 2020-12-28 LAB — TROPONIN I (HIGH SENSITIVITY)
Troponin I (High Sensitivity): 17 ng/L (ref <18)
Troponin I (High Sensitivity): 14 ng/L (ref <18)

## 2020-12-28 IMAGING — DX DG CHEST 2V
2 series · 2 of 2 positions shown · non-contrast
Comparison: [DATE]

CLINICAL DATA: Central chest pain with shortness of breath for 3
days

EXAM:
CHEST - 2 VIEW

[chest pa]
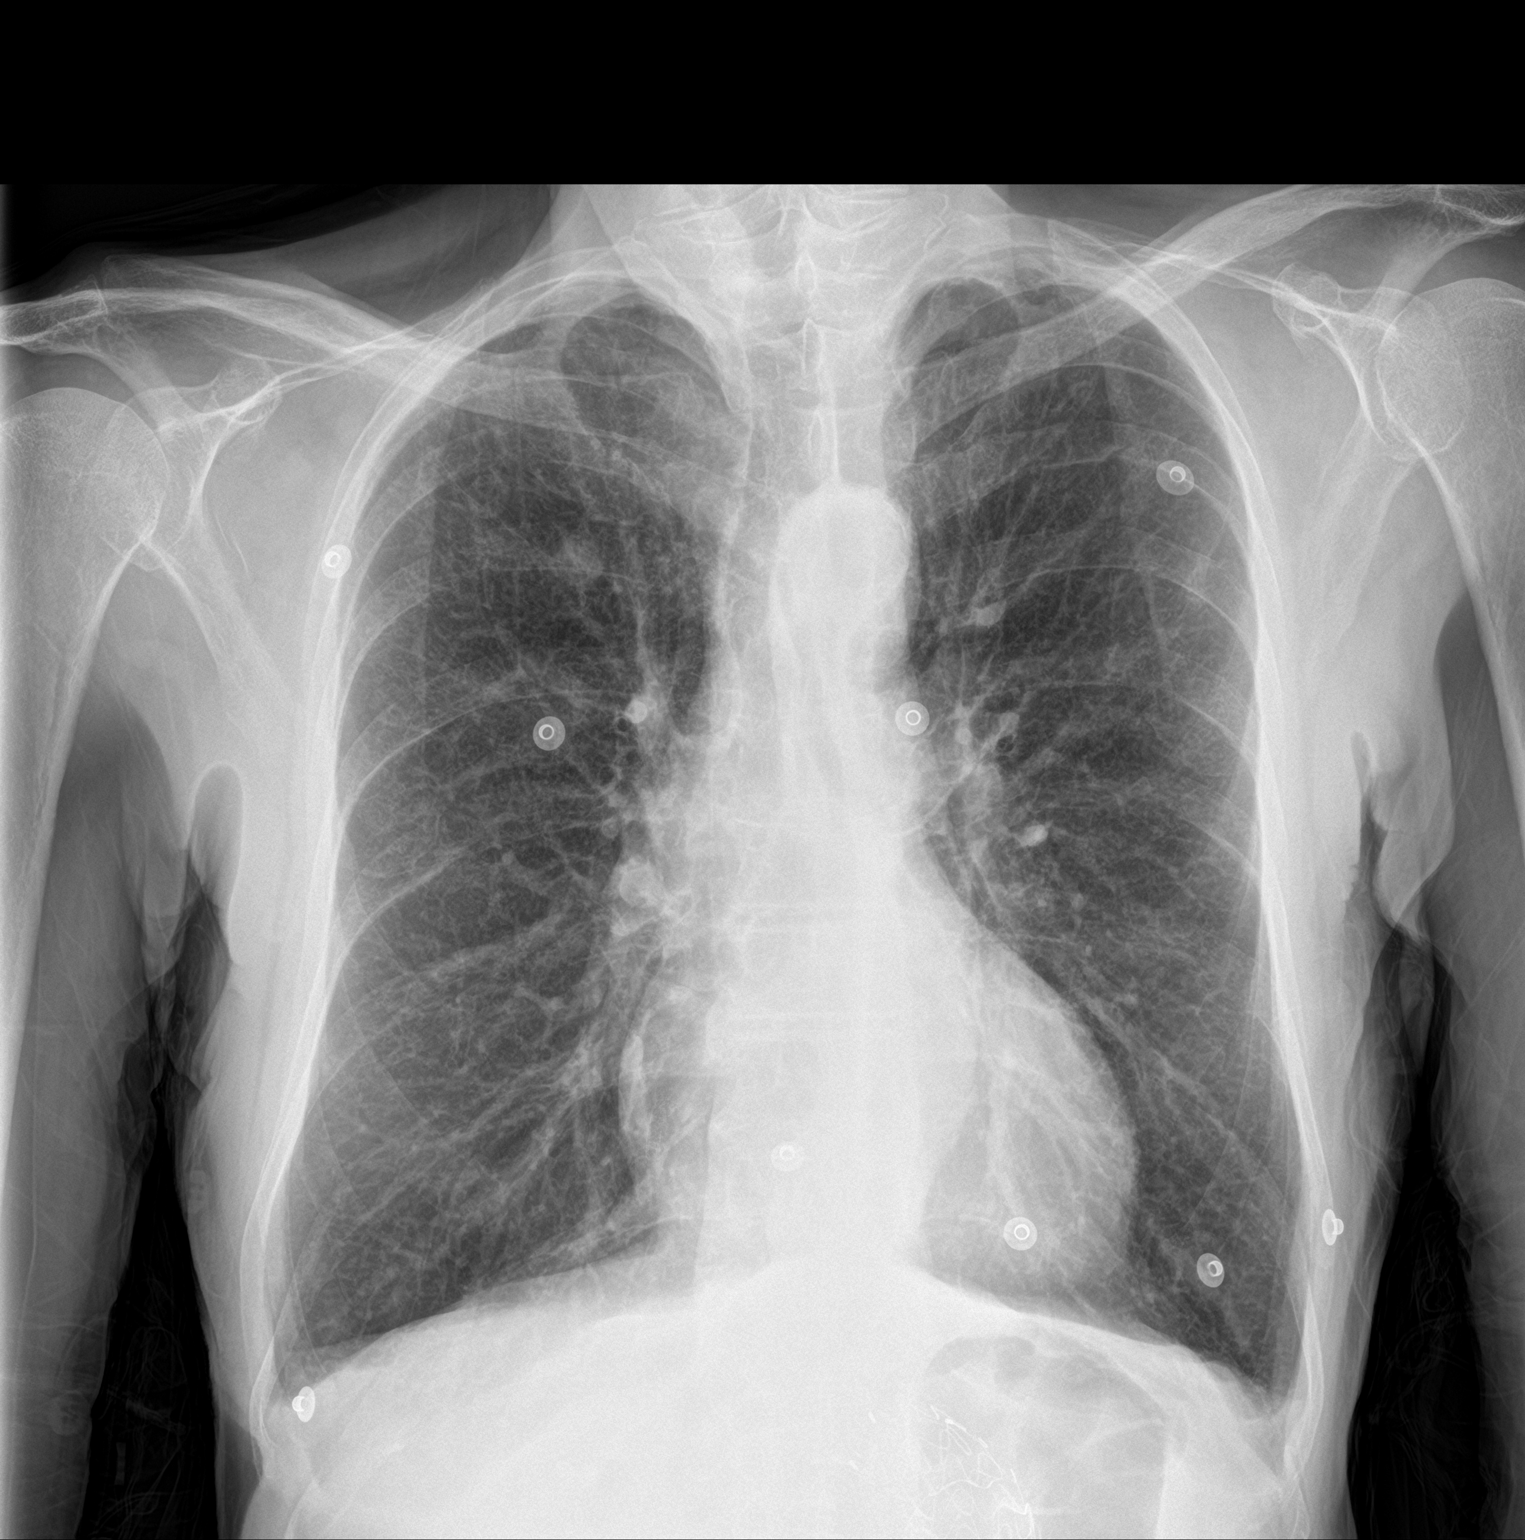

[chest lat]
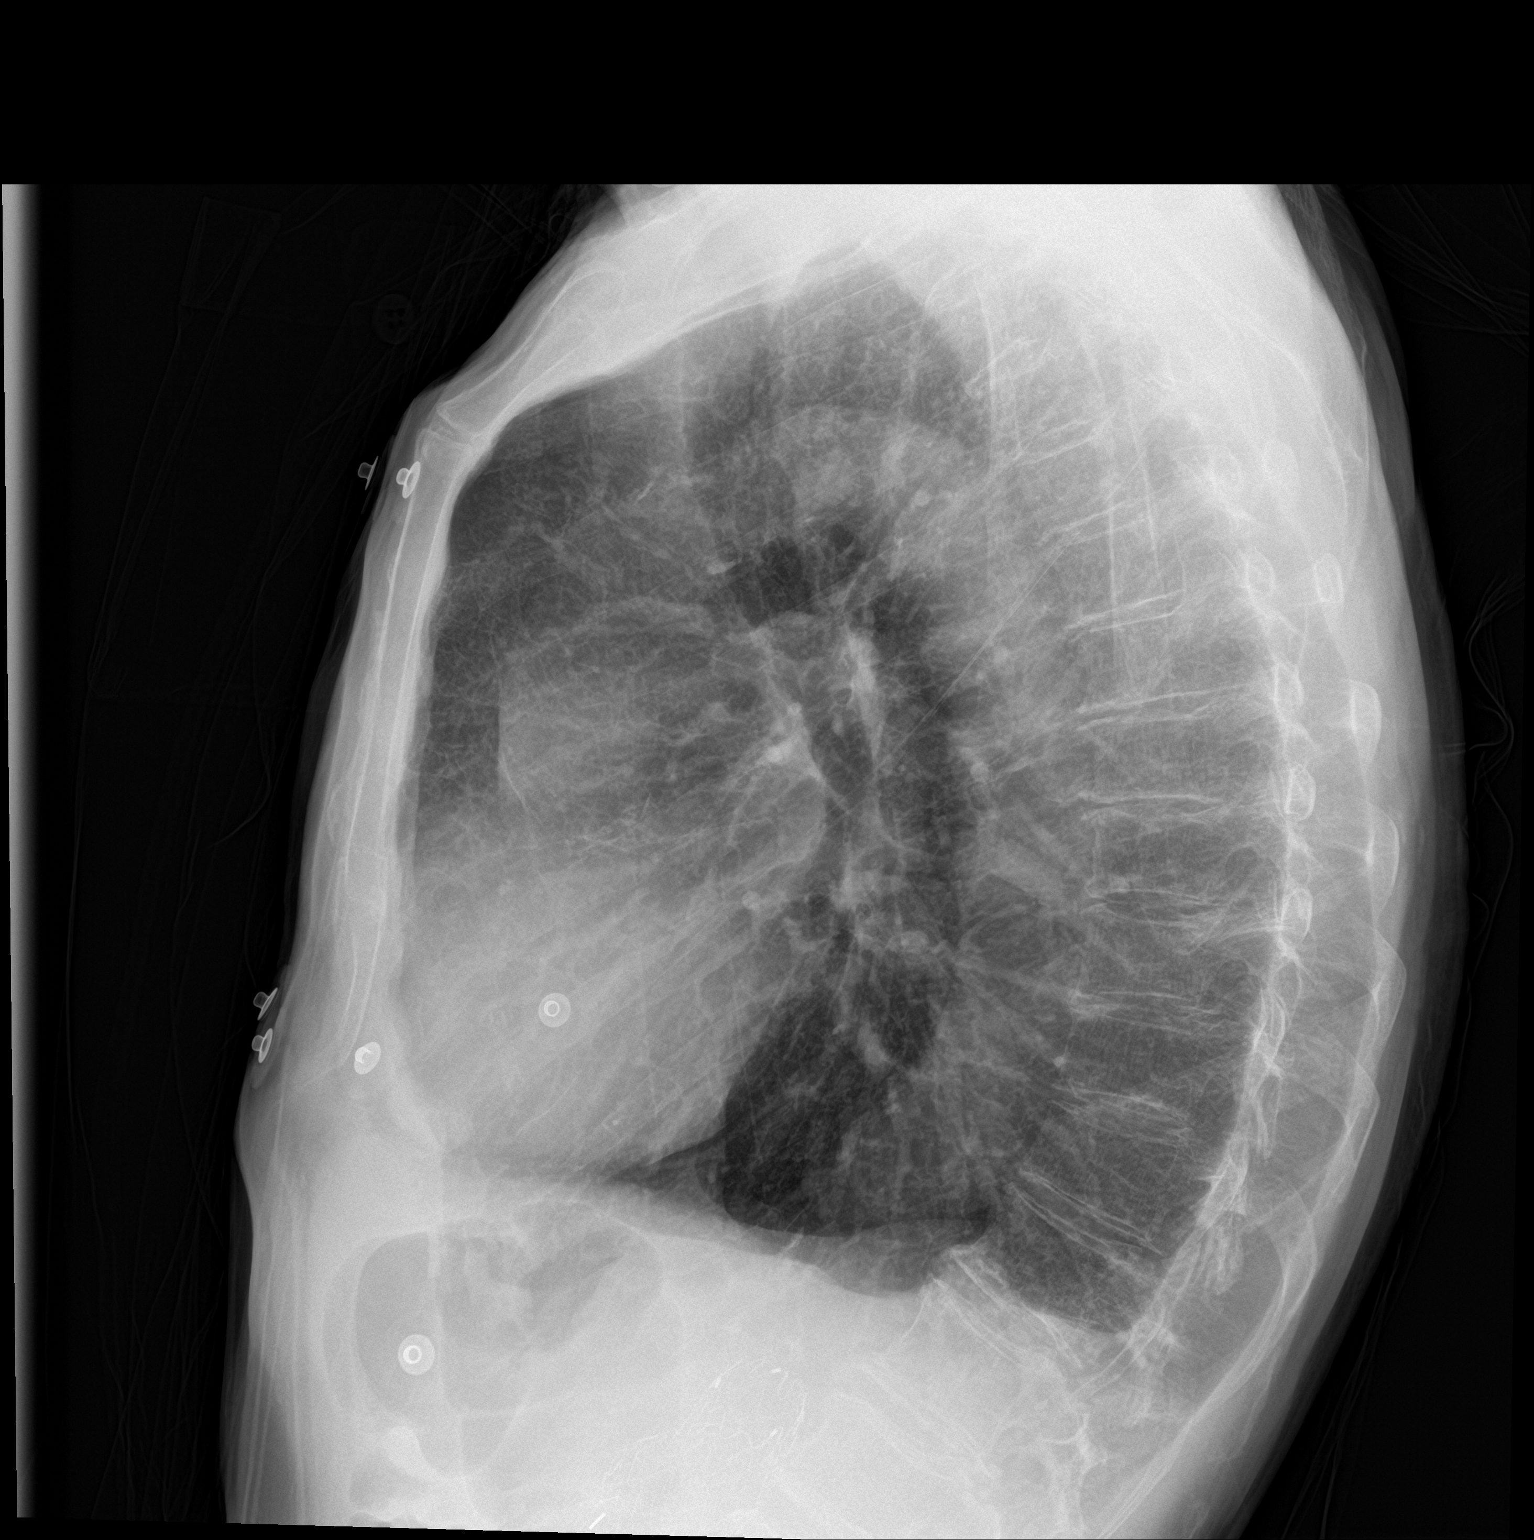

[2 of 2 positions shown; findings below may reference images not displayed]

FINDINGS: Large lung volumes with diaphragm flattening. There is history of
COPD. 12 mm nodular density over the right upper lobe. An adjacent
nodular density marked with an arrow has the shape of a button. No
edema, effusion, or pneumothorax. Normal heart size and stable
mediastinal contours. Abdominal aortic stent graft, minimally
covered.
IMPRESSION: 1. 12 mm nodule over the right upper lobe, recommend chest CT
follow-up.
2. COPD without acute superimposed finding.

## 2020-12-28 IMAGING — CT CT ABD-PELV W/ CM
2 of 5 series · 16 of 46 positions shown, 18 images · IV contrast (omnipaque)
Comparison: [DATE]

CLINICAL DATA: Right lower quadrant pain

EXAM:
CT ABDOMEN AND PELVIS WITH CONTRAST
TECHNIQUE: Multidetector CT imaging of the abdomen and pelvis was performed
using the standard protocol following bolus administration of
intravenous contrast.
CONTRAST:  100mL OMNIPAQUE IOHEXOL 350 MG/ML SOLN

[Series 3: a/p w/ 5mm · axial · 0.67mm/px · z∈[+823,+1198]mm · 13 of 85 slices shown, 15 images]
[im 5/85  soft-tissue]
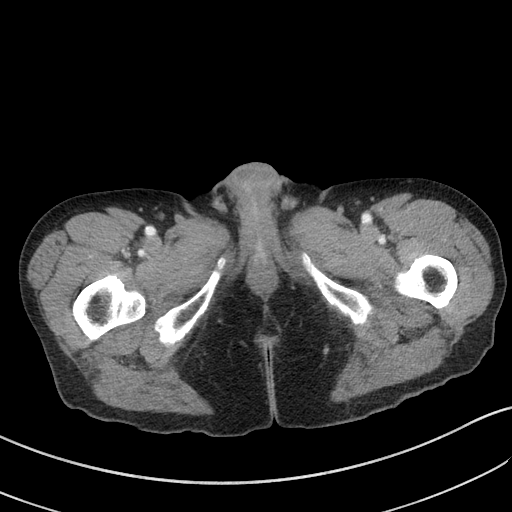
[im 5/85  bone]
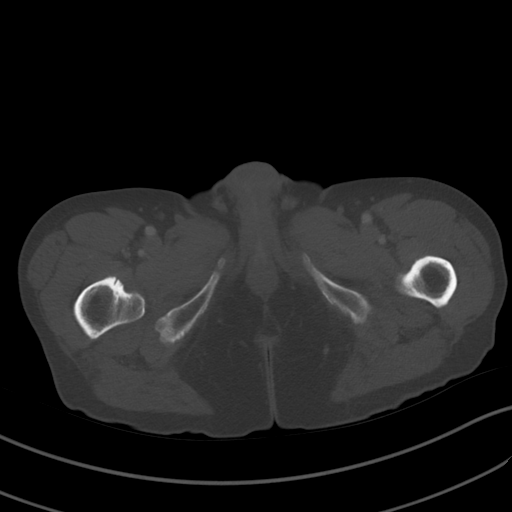
[im 10/85  soft-tissue]
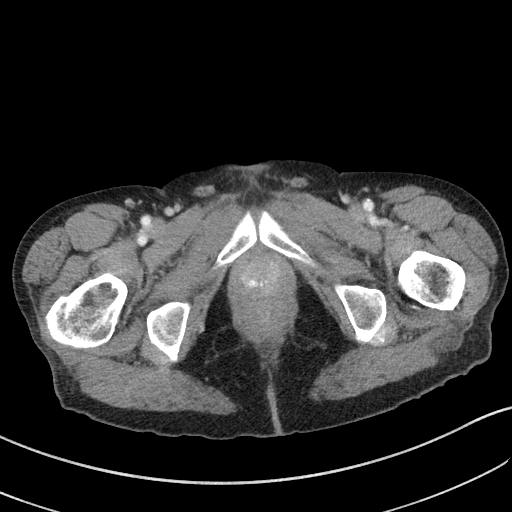
[im 19/85  soft-tissue]
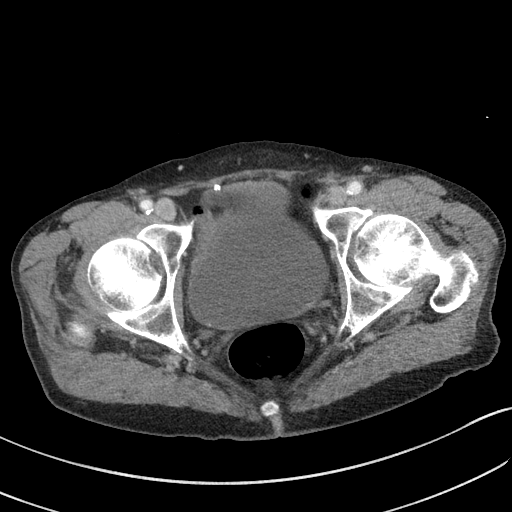
[im 24/85  soft-tissue]
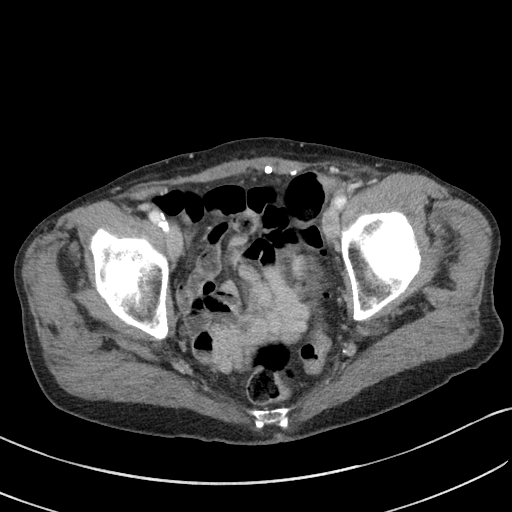
[im 29/85  soft-tissue]
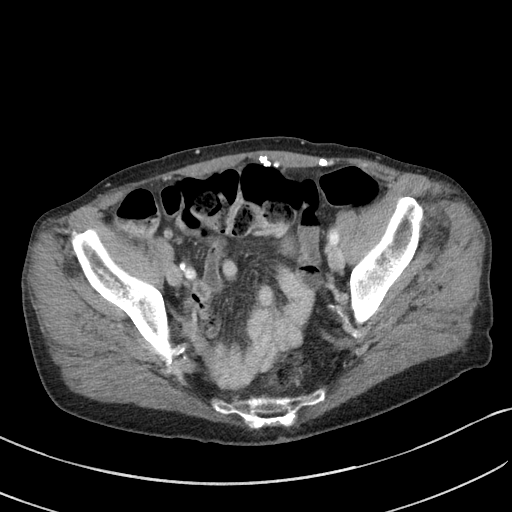
[im 38/85  soft-tissue]
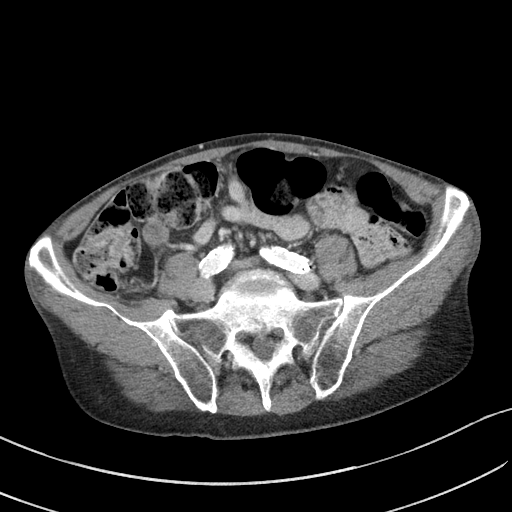
[im 43/85  soft-tissue]
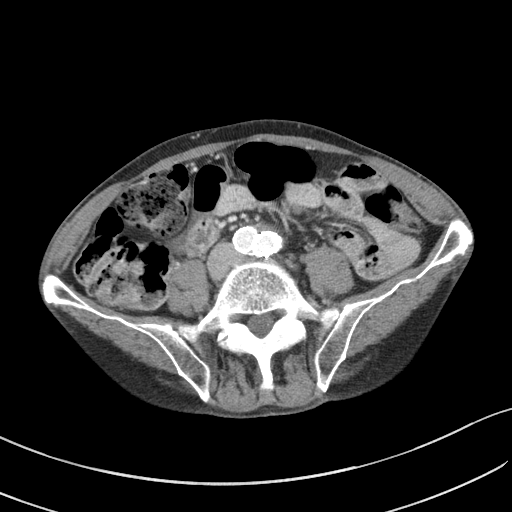
[im 47/85  soft-tissue]
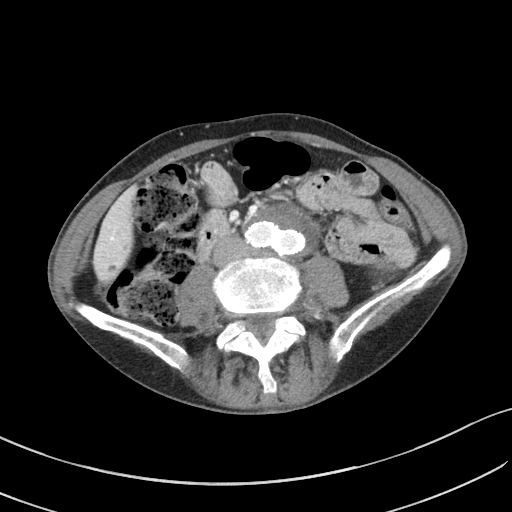
[im 57/85  soft-tissue]
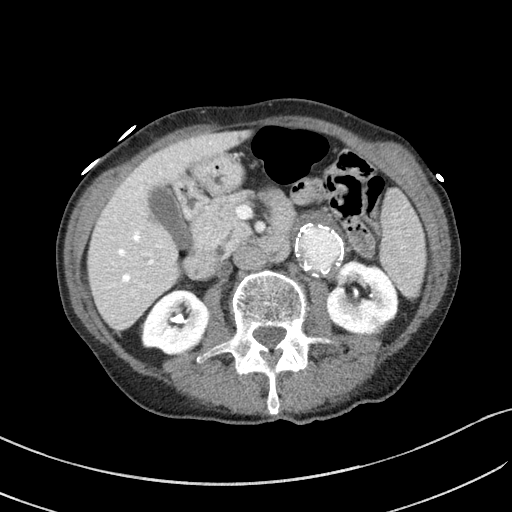
[im 57/85  bone]
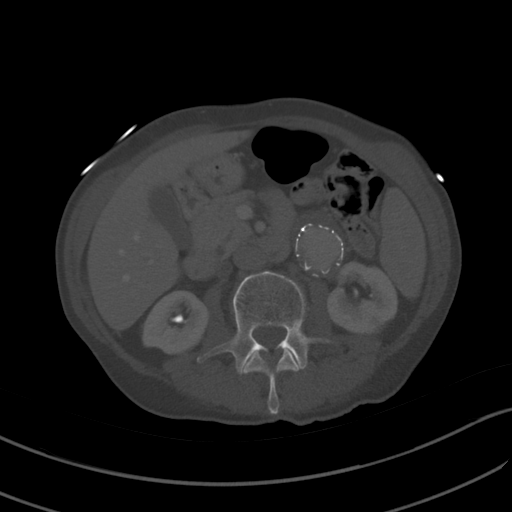
[im 61/85  soft-tissue]
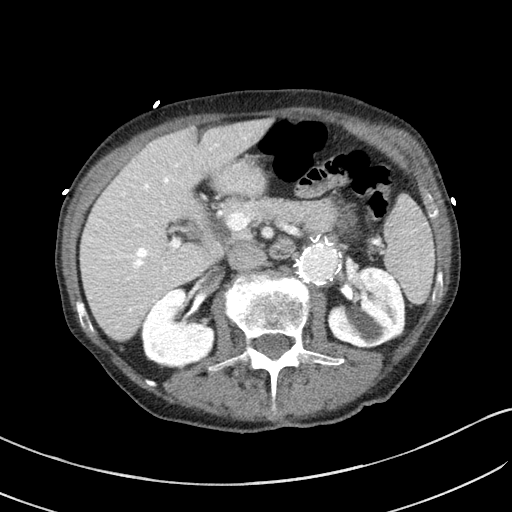
[im 66/85  soft-tissue]
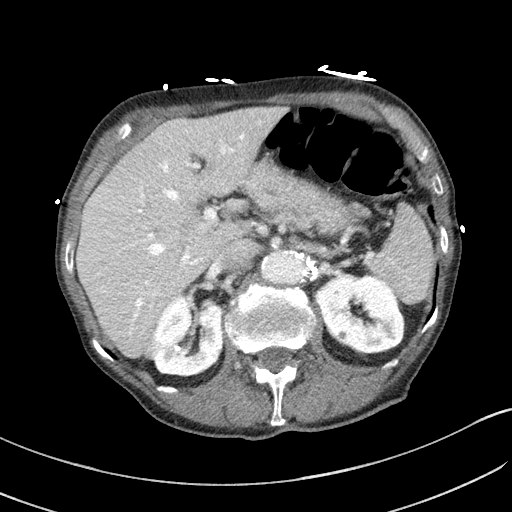
[im 75/85  soft-tissue]
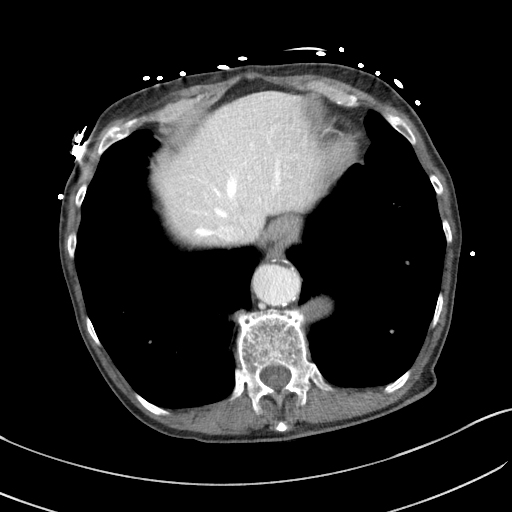
[im 80/85  soft-tissue]
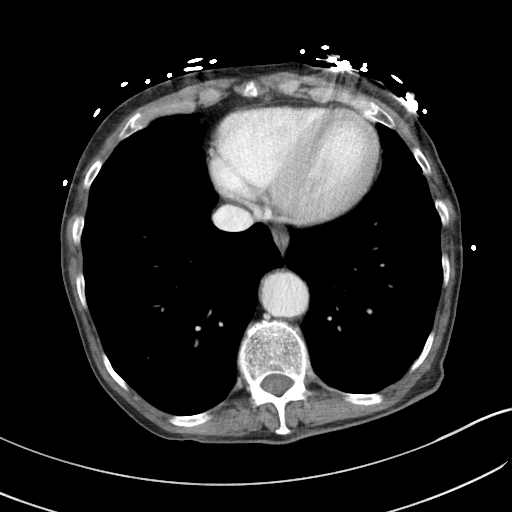

[Series 6: a/p w/ cor · coronal · 0.71mm/px · 3 of 122 slices shown]
[im 41/122  soft-tissue]
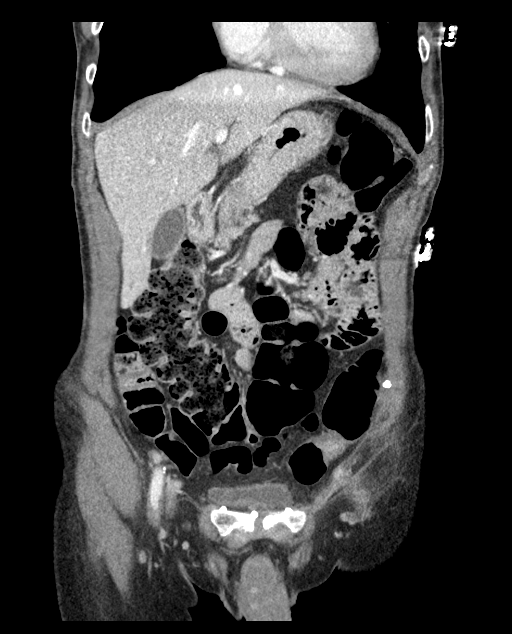
[im 54/122  soft-tissue]
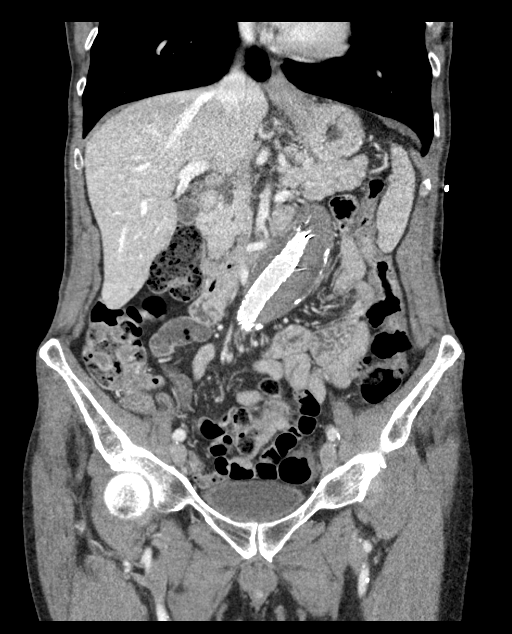
[im 68/122  soft-tissue]
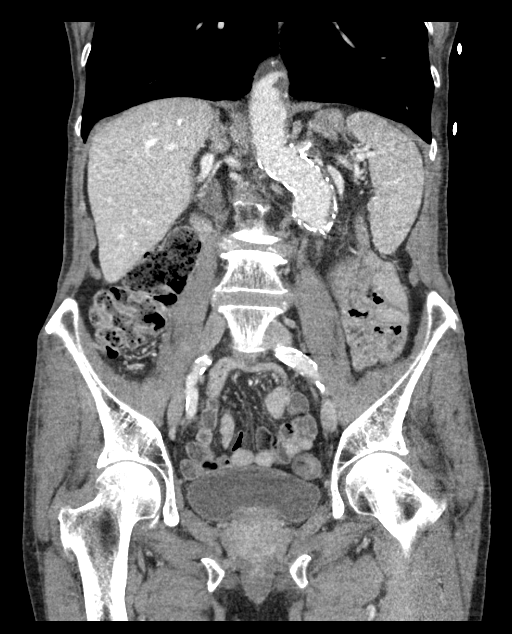

[16 of 46 positions shown; findings below may reference images not displayed]

FINDINGS: Lower chest: No acute abnormality.

Hepatobiliary: No focal hepatic abnormality. Gallbladder
unremarkable.

Pancreas: No focal abnormality or ductal dilatation.

Spleen: No focal abnormality.  Normal size.

Adrenals/Urinary Tract: 2 cm cyst in the midpole of the left kidney,
unchanged. 2.5 cm cyst off the lower pole of the left kidney,
unchanged. Bilateral nephrolithiasis, stable. Punctate punctate
calcifications noted along the posterior bladder wall near the UVJ
bilaterally. It is unclear if these reflect layering stones or
bladder wall calcifications. Adrenal glands unremarkable. Small
low-density lesion noted in the upper pole of the right kidney,
difficult to characterize but does not appear definitively cystic.

Stomach/Bowel: Normal appendix. Stomach, large and small bowel
grossly unremarkable.

Vascular/Lymphatic: Prior stent graft repair of AAA. Aneurysm sac
size measures 5.3 x 4.1 cm compared to 5.3 x 6.2 cm previously. No
adenopathy.

Reproductive: Prostate enlargement with calcifications.

Other: No free fluid or free air.

Musculoskeletal: No acute bony abnormality.
IMPRESSION: Bilateral nephrolithiasis.  No hydronephrosis.

Punctate calcifications along the posterior bladder wall near both
UVJ. It is unclear if these reflect small layering stones or bladder
wall calcifications. No hydronephrosis.

11 mm low-density lesion in the upper pole the right kidney which is
not definitively cystic. This is difficult to characterize due to
its small size and proximity to the liver. Consider further
evaluation with nonemergent MRI for better characterization.

Prior stent graft repair of AAA with decreasing size of the aneurysm
sac.

Prostate enlargement.

## 2020-12-28 IMAGING — CT CT ANGIO CHEST
2 of 6 series · 18 of 36 positions shown · IV contrast (omnipaque)
Comparison: [DATE]

CLINICAL DATA: Shortness of breath, chest pain

EXAM:
CT ANGIOGRAPHY CHEST WITH CONTRAST
TECHNIQUE: Multidetector CT imaging of the chest was performed using the
standard protocol during bolus administration of intravenous
contrast. Multiplanar CT image reconstructions and MIPs were
obtained to evaluate the vascular anatomy.
CONTRAST:  100mL OMNIPAQUE IOHEXOL 350 MG/ML SOLN

[Series 7: pe thins · axial · 0.66mm/px · z∈[+1147,+1422]mm · 17 of 311 slices shown]
[im 18/311  lung]
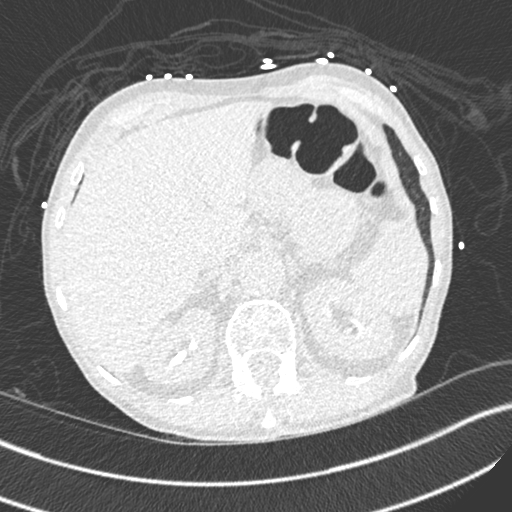
[im 35/311  mediastinal]
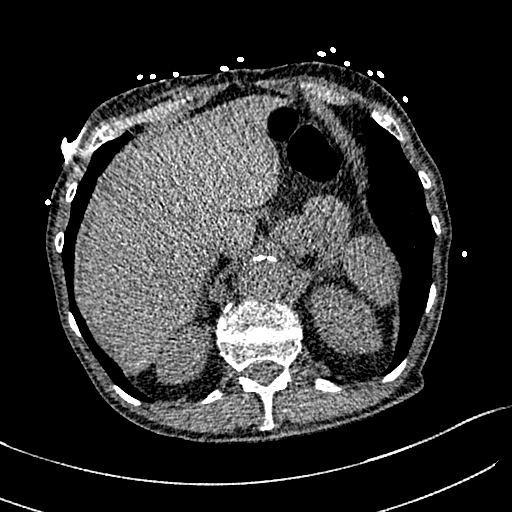
[im 52/311  lung]
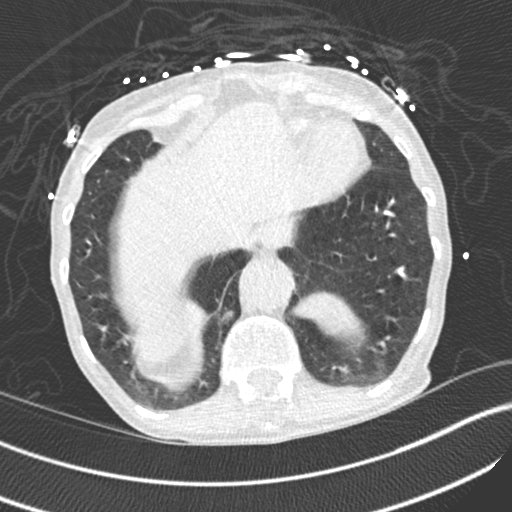
[im 69/311  mediastinal]
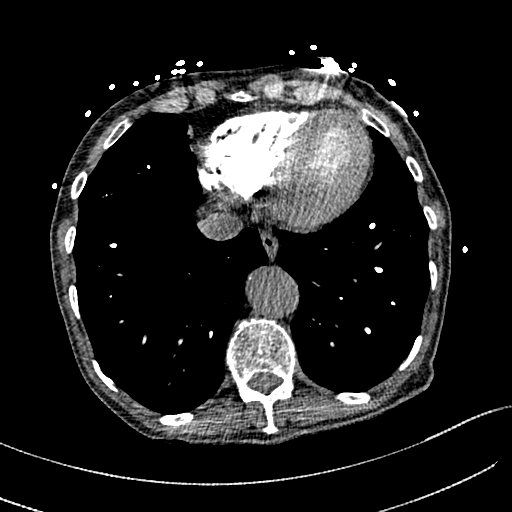
[im 87/311  lung]
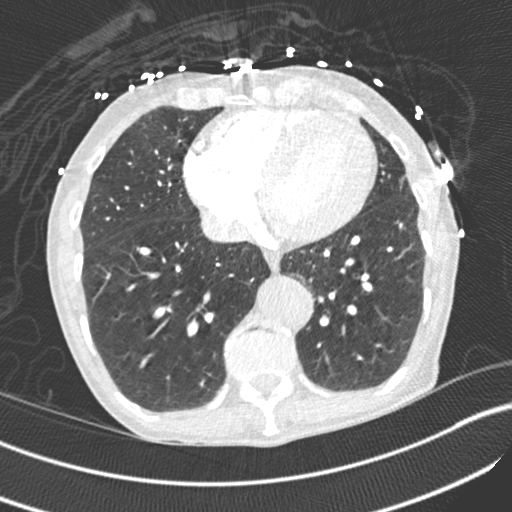
[im 104/311  mediastinal]
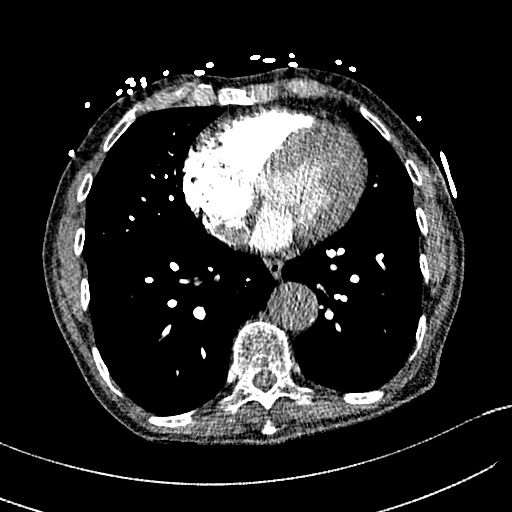
[im 121/311  lung]
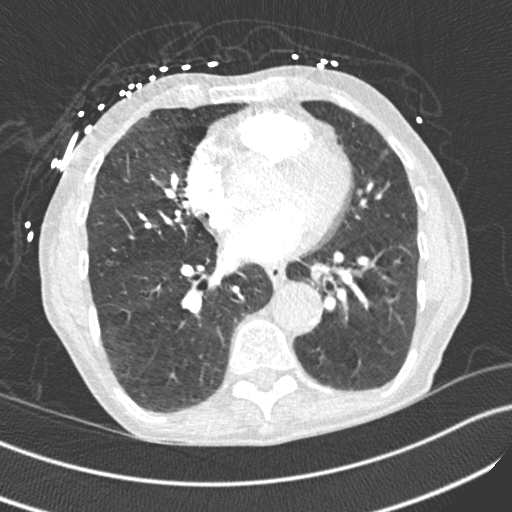
[im 138/311  mediastinal]
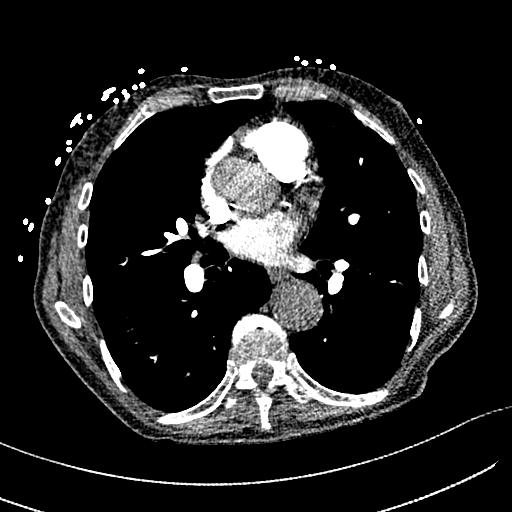
[im 156/311  lung]
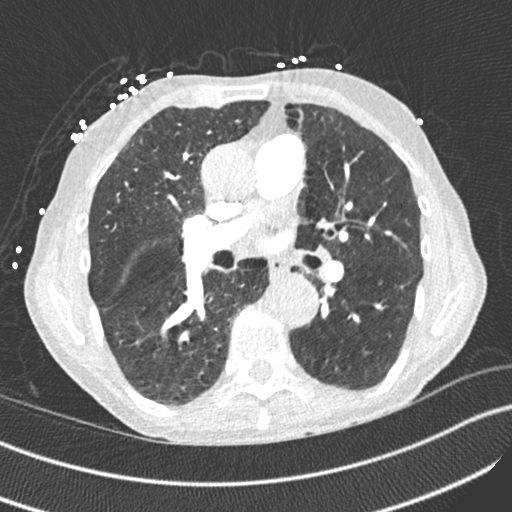
[im 173/311  mediastinal]
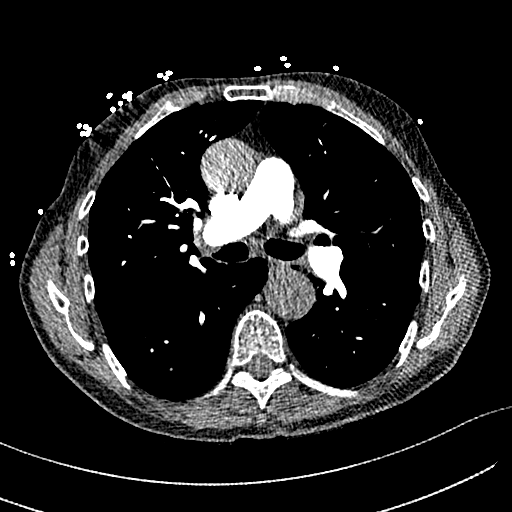
[im 190/311  lung]
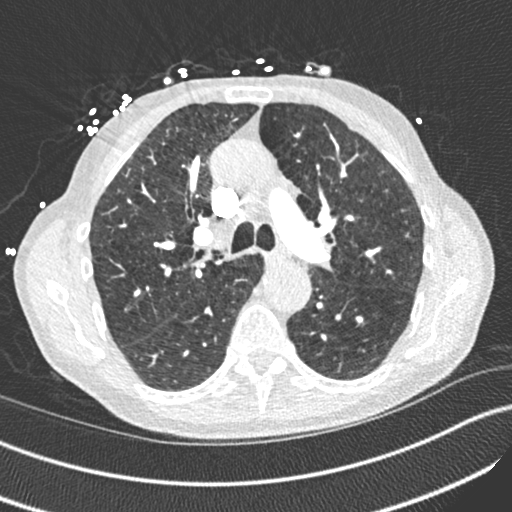
[im 207/311  mediastinal]
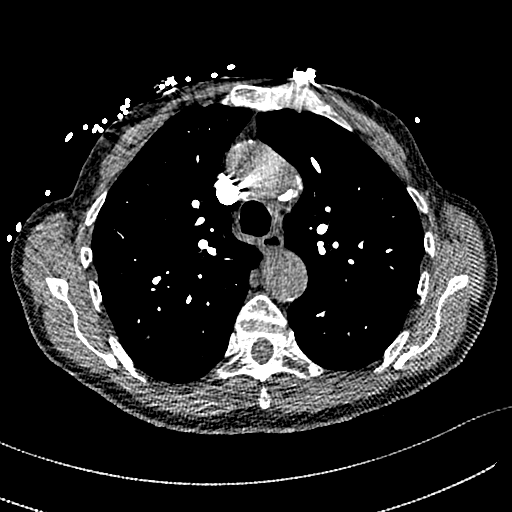
[im 224/311  lung]
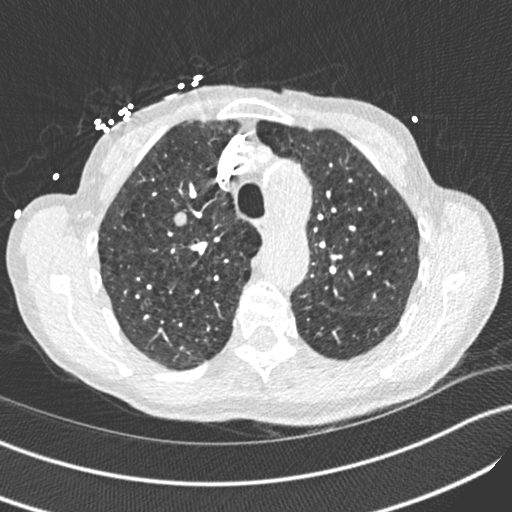
[im 242/311  mediastinal]
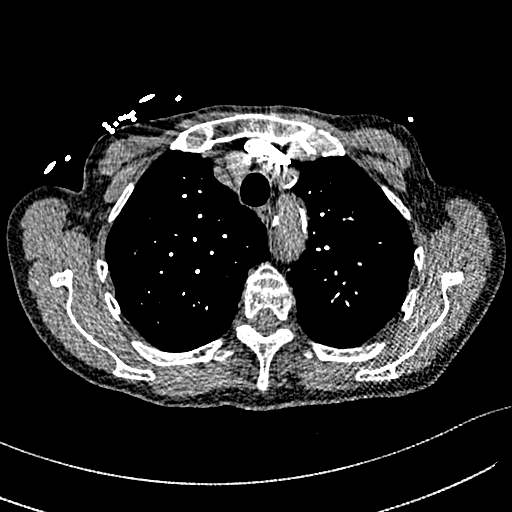
[im 259/311  lung]
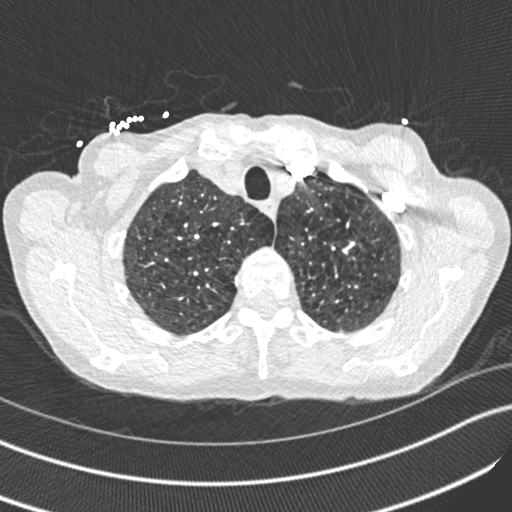
[im 276/311  mediastinal]
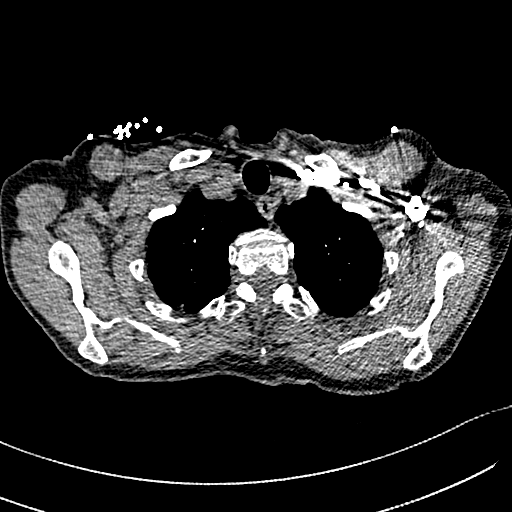
[im 293/311  lung]
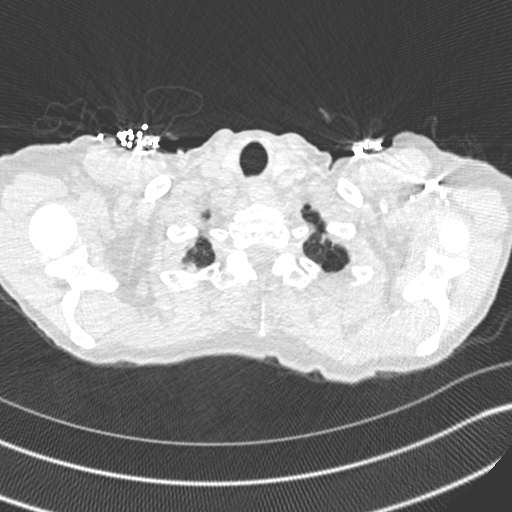

[Series 8: pe 2mm cor · coronal · 0.66mm/px · 1 of 123 slices shown]
[im 62/123  mediastinal]
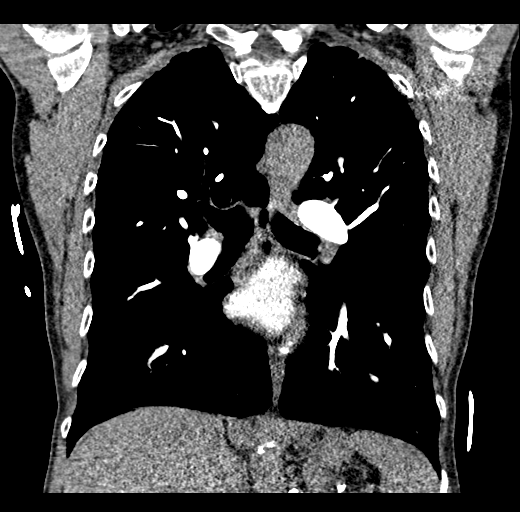

[18 of 36 positions shown; findings below may reference images not displayed]

FINDINGS: Cardiovascular: No filling defects in the pulmonary arteries to
suggest pulmonary emboli. Heart is normal size. Aorta is normal
caliber. Aortic and coronary artery calcifications.

Mediastinum/Nodes: No mediastinal, hilar, or axillary adenopathy.
Trachea and esophagus are unremarkable. Thyroid unremarkable.

Lungs/Pleura: Moderate emphysema. Right upper lobe nodule measures
14 mm compared to 10 mm previously. The slow growth over the last 9
years suggests a benign process. Biapical scarring. No acute
confluent opacities or effusions.

Upper Abdomen: Imaging into the upper abdomen demonstrates no acute
findings.

Musculoskeletal: Chest wall soft tissues are unremarkable. No acute
bony abnormality.

Review of the MIP images confirms the above findings.
IMPRESSION: No evidence of pulmonary embolus.

14 mm right upper lobe pulmonary nodule, slightly increased in size
from 10 mm since [P0]. This slow of growth over the last 9 years
suggests a benign process.

Coronary artery disease.

Aortic Atherosclerosis ([P0]-[P0]) and Emphysema ([P0]-[P0]).

## 2020-12-28 MED ORDER — OMEGA-3-ACID ETHYL ESTERS 1 G PO CAPS
1.0000 g | ORAL_CAPSULE | Freq: Every day | ORAL | Status: DC
  Administered 2020-12-29: 1 g via ORAL
  Filled 2020-12-28: qty 1

## 2020-12-28 MED ORDER — ONDANSETRON HCL 4 MG/2ML IJ SOLN: 4.0000 mg | Freq: Four times a day (QID) | INTRAMUSCULAR | Status: DC | PRN

## 2020-12-28 MED ORDER — ADULT MULTIVITAMIN W/MINERALS CH
1.0000 | ORAL_TABLET | Freq: Every day | ORAL | Status: DC
  Administered 2020-12-28 – 2020-12-29 (×2): 1 via ORAL
  Filled 2020-12-28 (×2): qty 1

## 2020-12-28 MED ORDER — CO Q 10 100 MG PO CAPS: 100.0000 mg | ORAL_CAPSULE | Freq: Every day | ORAL | Status: DC

## 2020-12-28 MED ORDER — ENOXAPARIN SODIUM 40 MG/0.4ML IJ SOSY
40.0000 mg | PREFILLED_SYRINGE | INTRAMUSCULAR | Status: DC
  Administered 2020-12-28: 40 mg via SUBCUTANEOUS
  Filled 2020-12-28 (×2): qty 0.4

## 2020-12-28 MED ORDER — OMEGA-3 KRILL OIL 300 MG PO CAPS: 200.0000 mg | ORAL_CAPSULE | Freq: Every day | ORAL | Status: DC

## 2020-12-28 MED ORDER — HYDROCODONE-ACETAMINOPHEN 5-325 MG PO TABS
1.0000 | ORAL_TABLET | Freq: Four times a day (QID) | ORAL | Status: DC | PRN
  Administered 2020-12-28 – 2020-12-29 (×2): 1 via ORAL
  Filled 2020-12-28 (×2): qty 1

## 2020-12-28 MED ORDER — NITROGLYCERIN 0.4 MG SL SUBL: 0.4000 mg | SUBLINGUAL_TABLET | SUBLINGUAL | Status: DC | PRN

## 2020-12-28 MED ORDER — CLOPIDOGREL BISULFATE 75 MG PO TABS
75.0000 mg | ORAL_TABLET | Freq: Every day | ORAL | Status: DC
  Administered 2020-12-29: 75 mg via ORAL
  Filled 2020-12-28: qty 1

## 2020-12-28 MED ORDER — TRAZODONE HCL 100 MG PO TABS
100.0000 mg | ORAL_TABLET | Freq: Every day | ORAL | Status: DC
  Administered 2020-12-28: 100 mg via ORAL
  Filled 2020-12-28: qty 1

## 2020-12-28 MED ORDER — ACETAMINOPHEN 325 MG PO TABS: 650.0000 mg | ORAL_TABLET | ORAL | Status: DC | PRN

## 2020-12-28 MED ORDER — NITROGLYCERIN 0.1 MG/HR TD PT24
0.1000 mg | MEDICATED_PATCH | TRANSDERMAL | Status: DC
  Administered 2020-12-28 – 2020-12-29 (×2): 0.1 mg via TRANSDERMAL
  Filled 2020-12-28 (×2): qty 1

## 2020-12-28 MED ORDER — ATENOLOL 25 MG PO TABS
25.0000 mg | ORAL_TABLET | Freq: Every day | ORAL | Status: DC
  Administered 2020-12-28: 25 mg via ORAL
  Filled 2020-12-28: qty 1

## 2020-12-28 MED ORDER — ATORVASTATIN CALCIUM 80 MG PO TABS
80.0000 mg | ORAL_TABLET | Freq: Every day | ORAL | Status: DC
  Administered 2020-12-28 – 2020-12-29 (×2): 80 mg via ORAL
  Filled 2020-12-28: qty 1
  Filled 2020-12-28: qty 2

## 2020-12-28 MED ORDER — LATANOPROST 0.005 % OP SOLN
1.0000 [drp] | Freq: Every day | OPHTHALMIC | Status: DC
  Administered 2020-12-28 – 2020-12-29 (×2): 1 [drp] via OPHTHALMIC
  Filled 2020-12-28: qty 2.5

## 2020-12-28 MED ORDER — ASPIRIN EC 81 MG PO TBEC
81.0000 mg | DELAYED_RELEASE_TABLET | Freq: Every day | ORAL | Status: DC
  Administered 2020-12-29: 81 mg via ORAL
  Filled 2020-12-28: qty 1

## 2020-12-28 MED ORDER — ALBUTEROL SULFATE (2.5 MG/3ML) 0.083% IN NEBU: 3.0000 mL | INHALATION_SOLUTION | RESPIRATORY_TRACT | Status: DC | PRN

## 2020-12-28 MED ORDER — AMLODIPINE BESYLATE 2.5 MG PO TABS
2.5000 mg | ORAL_TABLET | Freq: Every day | ORAL | Status: DC
  Administered 2020-12-29: 2.5 mg via ORAL
  Filled 2020-12-28 (×2): qty 1

## 2020-12-28 MED ORDER — BRIMONIDINE TARTRATE 0.15 % OP SOLN
1.0000 [drp] | Freq: Two times a day (BID) | OPHTHALMIC | Status: DC
  Administered 2020-12-28 – 2020-12-29 (×3): 1 [drp] via OPHTHALMIC
  Filled 2020-12-28: qty 5

## 2020-12-28 MED ORDER — MORPHINE SULFATE (PF) 2 MG/ML IV SOLN: 1.0000 mg | INTRAVENOUS | Status: DC | PRN

## 2020-12-28 MED ORDER — IOHEXOL 350 MG/ML SOLN
100.0000 mL | Freq: Once | INTRAVENOUS | Status: AC | PRN
  Administered 2020-12-28: 100 mL via INTRAVENOUS

## 2020-12-28 MED ORDER — VITAMIN D 25 MCG (1000 UNIT) PO TABS
1000.0000 [IU] | ORAL_TABLET | Freq: Every day | ORAL | Status: DC
  Administered 2020-12-28 – 2020-12-29 (×2): 1000 [IU] via ORAL
  Filled 2020-12-28 (×2): qty 1

## 2020-12-28 MED ORDER — UMECLIDINIUM BROMIDE 62.5 MCG/INH IN AEPB
1.0000 | INHALATION_SPRAY | Freq: Every day | RESPIRATORY_TRACT | Status: DC
  Administered 2020-12-28 – 2020-12-29 (×2): 1 via RESPIRATORY_TRACT
  Filled 2020-12-28: qty 7

## 2020-12-28 MED ORDER — METOPROLOL SUCCINATE ER 25 MG PO TB24
12.5000 mg | ORAL_TABLET | Freq: Every day | ORAL | Status: DC
  Administered 2020-12-29: 12.5 mg via ORAL
  Filled 2020-12-28 (×2): qty 1

## 2020-12-28 NOTE — Consult Note (Signed)
Cardiology Consultation:   Patient ID: [name] MRN: [id number]; DOB: 1940/05/26  Admit date: 12/28/2020 Date of Consult: 12/28/2020  PCP:  [name], MD   [facility] Providers Cardiologist:  [name], MD   Patient Profile:   Connor Cuevas is a 80 y.o. male with a hx of CAD s/p DES to RCA (01/2014), HLD, AAA, and COPD who is being seen 12/28/2020 for the evaluation of chest pain at the request of Dr. Earnest Conroy.  History of Present Illness:   Connor Cuevas has a history of CAD s/p DES to mid RCA in Oct 2015. Nuclear stress test 06/05/19 was nonischemic and low risk. AAA was 4.5 cm on Jan 2021, MRI spine for back pain with AAA 4.9 cm. He was referred back to [facility] and underwent endovascular repair for infrarenal AAA for a 5.9 cm AAA in April 2022. Percutaneous closure device failed requiring bilateral femoral cutdowns. Last seen by Dr. [name] 05/04/20 and was doing well at that time. He had a TURBT 08/2020 for bladder cancer/low grade papillary urothelial carcinoma.    He presented to [facility] 12/28/20 with chest pain. His wife was hospitalized over the weekend. He has had significant worsening of chest pain. He had intermittent chest pain for 2-3 days. His CP worsened last night between 10 PM to 1AM. CP was exertional, worse with ambulation, and has since transitioned to resting pain. CP relieved with nitro.   Workup notable for HS troponin x 2 negative CTA negative for PE Lung nodule noted with increase in size since 2013, suspected benign  During my interview, he now states he has been battling chest pain with exertion for the last 3 weeks. His son at bedside also describes a 25 lb weight gain in the last 5-6 weeks. He states that he woke up last night gasping for breath. He sat on the side of the bed and continued to have dyspnea with development of chest tightness. He tried to move around and get his breakfast and coffee, but continued to have dyspnea. On EMS arrival, his SBP was in  the 200s. He was given nitro x 2 which improved his BP. He now has a headache. He also describers fluttering in his stomach. He is very concerned about his wife's hospitalization, who interestingly is getting discharged today. He thinks he may be anxious, confirmed by his son.   I plugged in his telemetry, pulse ox, and took a BP. He is sinus rhythm and O2 was 98% on room air, however he continues to gasp for air during out conversation. COVID negative.    Past Medical History:  Diagnosis Date   AAA (abdominal aortic aneurysm) ([place]) 04/16/2018   4.3 cm, noted on [place]    Acute kidney failure ([place]) 2005 approx   after ureteroscopy, hospitalized for about 10 days afterwards   Anxiety    Arthritis    Hands, wrist, elbow, back and spine oa   Bladder mass    Blind right eye since 07-2019   Bulging lumbar disc    L4-L5 degeneration, Chronic back pain   CAD (coronary artery disease)    a. 01/09/14: [place] s/p DES to RCA    COPD (chronic obstructive pulmonary disease) ([place])    wheezing   Dyspnea    with exertion   Glaucoma of both eyes    Hepatic cyst 2013   Several tiny hepatic cysts , noted on CT   History of kidney stones    small kidney stones in right  kidney   Hyperlipidemia    Interstitial lung disease ([place])    managed by pcp dr Memory Dance swayne   Pneumonia yrs ago   Pulmonary nodule, right 08/02/2016   a. 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 Aortosclerosis, bilateral - mild with calcification. Per Dr. Moreen Fowler at [place] [facility]    Wears dentures    upper    Wears glasses     Past Surgical History:  Procedure Laterality Date   ABDOMINAL AORTIC ENDOVASCULAR STENT GRAFT  06/21/2020   Procedure: ABDOMINAL AORTIC ENDOVASCULAR STENT GRAFT;  Surgeon: Marty Heck, MD;  Location: [place];  Service: Vascular;;   CARDIAC  CATHETERIZATION  2017   right carotid stent   CATARACT EXTRACTION W/ INTRAOCULAR LENS  IMPLANT, BILATERAL  1997 (APPROX)   CATARACT EXTRACTION W/PHACO Left 03/31/2020   Procedure: CATARACT EXTRACTION PHACO AND INTRAOCULAR LENS PLACEMENT (IOC) LEFT 5.51 01:16.1 7.2%;  Surgeon: Leandrew Koyanagi, MD;  Location: [place];  Service: Ophthalmology;  Laterality: Left;   COLONOSCOPY  2017   CYSTOSCOPY W/ URETERAL STENT PLACEMENT Bilateral 08/13/2020   Procedure: CYSTOSCOPY WITH BILATERAL RETROGRADE PYELOGRAM;  Surgeon: Janith Lima, MD;  Location: [facility];  Service: Urology;  Laterality: Bilateral;   CYSTOSCOPY/RETROGRADE/URETEROSCOPY/STONE EXTRACTION WITH BASKET  10/17/2011   Procedure: CYSTOSCOPY/RETROGRADE/URETEROSCOPY/STONE EXTRACTION WITH BASKET;  Surgeon: Hanley Ben, MD;  Location: [place];  Service: Urology;  Laterality: Right;   CYSTOSCOPY/URETEROSCOPY/HOLMIUM LASER/STENT PLACEMENT Right 05/13/2018   Procedure: CYSTOSCOPY/RETROGRADE/URETEROSCOPY/HOLMIUM LASER;  Surgeon: Kathie Rhodes, MD;  Location: [facility]  Service: Urology;  Laterality: Right;   HERNIA REPAIR  15 yrs ago   bil inguinal   LAPAROSCOPIC LEFT INGUINAL (OBTURATOR) HERNIA AND UMBILICAL HERNIA REPAIR  01-13-2011   LEFT HEART CATHETERIZATION WITH CORONARY ANGIOGRAM N/A 01/09/2014   Procedure: LEFT HEART CATHETERIZATION WITH CORONARY ANGIOGRAM;  Surgeon: Josue Hector, MD;  Location[facility] C[facility];  Service: Cardiovascular;  Laterality: N/A;   LEFT URETEROSCOPIC STONE EXTRACTION  11-30-2000   W/ PREVIOUS ESWL AND URETERAL STENT PLACEMENT   PHOTOCOAGULATION Right 12/10/2019   Procedure: TRANSCLEARAL DIODE CYCLOPHOTOCOAGULATION RIGHT;  Surgeon: Leandrew Koyanagi, MD;  Location: [place];  Service: Ophthalmology;  Laterality: Right;   REPAIR RIGHT INGUINAL HERNIA W/ MESH  12-31-2000   TONSILLECTOMY  age 35   TRANSURETHRAL RESECTION OF BLADDER TUMOR N/A  08/13/2020   Procedure: TRANSURETHRAL RESECTION OF BLADDER TUMOR (TURBT) WITH POSTOPERATIVE INSTILLATION OF GEMCITABINE;  Surgeon: Janith Lima, MD;  Location: [facility];  Service: Urology;  Laterality: N/A;  GENERAL ANESTHESIA WITH INTUBATED, PARALYSIS   ULTRASOUND GUIDANCE FOR VASCULAR ACCESS Bilateral 06/21/2020   Procedure: ULTRASOUND GUIDANCE FOR VASCULAR ACCESS;  Surgeon: Marty Heck, MD;  Location: [place];  Service: Vascular;  Laterality: Bilateral;   URETEROLITHOTOMY  2009     Home Medications:  Prior to Admission medications   Medication Sig Start Date End Date Taking? Authorizing Provider  aspirin EC 81 MG EC tablet Take 1 tablet (81 mg total) by mouth daily at 6 (six) AM. Swallow whole. 06/23/20  Yes Rhyne, Samantha J, PA-C  atenolol (TENORMIN) 25 MG tablet Take 1 tablet (25 mg total) by mouth daily. 05/04/20  Yes Josue Hector, MD  atorvastatin (LIPITOR) 80 MG tablet Take 1 tablet (80  mg total) by mouth every morning. Patient taking differently: Take 80 mg by mouth daily. 05/04/20  Yes Josue Hector, MD  brimonidine (ALPHAGAN P) 0.1 % SOLN Place 1 drop into both eyes 2 (two) times daily.   Yes [provider]  clopidogrel (PLAVIX) 75 MG tablet Take 1 tablet by mouth once daily 09/15/20  Yes Josue Hector, MD  Coenzyme Q10 (CO Q 10) 100 MG CAPS Take 100 mg by mouth daily.   Yes [provider]  gabapentin (NEURONTIN) 100 MG capsule Take 1 capsule (100 mg total) by mouth 3 (three) times daily. Patient taking differently: Take 100 mg by mouth as needed. 10/30/19  Yes Magnant, Charles L, PA-C  GARLIC PO Take by mouth daily.   Yes [provider]  HYDROcodone-acetaminophen (NORCO/VICODIN) 5-325 MG tablet Take 1 tablet by mouth every 6 (six) hours as needed for moderate pain. 08/13/20  Yes Janith Lima, MD  latanoprost (XALATAN) 0.005 % ophthalmic solution Place 1 drop into both eyes at bedtime.   Yes [provider]  Multiple  Vitamin (MULTIVITAMIN) tablet Take 1 tablet by mouth daily.   Yes [provider]  Multiple Vitamins-Minerals (ICAPS AREDS FORMULA PO) Take 1 tablet by mouth 2 (two) times daily.   Yes [provider]  OMEGA-3 KRILL OIL PO Take 200 mg by mouth daily.   Yes [provider]  PROAIR HFA 108 (90 Base) MCG/ACT inhaler Inhale 2 puffs into the lungs every 4 (four) hours as needed for wheezing. 09/16/18  Yes [provider]  pseudoephedrine (SUDAFED) 120 MG 12 hr tablet Take 120 mg by mouth daily as needed for congestion.   Yes [provider]  tiotropium (SPIRIVA) 18 MCG inhalation capsule Place 18 mcg into inhaler and inhale daily.   Yes [provider]  traZODone (DESYREL) 100 MG tablet Take 100 mg by mouth at bedtime.  03/10/15  Yes [provider]  VITAMIN D, CHOLECALCIFEROL, PO Take 1 tablet by mouth daily.   Yes [provider]  methocarbamol (ROBAXIN) 500 MG tablet TAKE 1 TABLET BY MOUTH EVERY 8 HOURS AS NEEDED Patient not taking: Reported on 12/28/2020 08/29/19   Magnant, Gerrianne Scale, PA-C  nitroGLYCERIN (NITROSTAT) 0.4 MG SL tablet Place 1 tablet (0.4 mg total) under the tongue every 5 (five) minutes as needed for chest pain. 06/12/19   Josue Hector, MD    Inpatient Medications: Scheduled Meds:  [START ON 12/29/2020] aspirin EC  81 mg Oral Q0600   atenolol  25 mg Oral Daily   atorvastatin  80 mg Oral Daily   brimonidine  1 drop Both Eyes BID   cholecalciferol  1,000 Units Oral Daily   [START ON 12/29/2020] clopidogrel  75 mg Oral Daily   enoxaparin (LOVENOX) injection  40 mg Subcutaneous Q24H   latanoprost  1 drop Both Eyes QHS   multivitamin with minerals  1 tablet Oral Daily   nitroGLYCERIN  0.1 mg Transdermal Q24H   Omega-3 Krill Oil  200 mg Oral Daily   traZODone  100 mg Oral QHS   umeclidinium bromide  1 puff Inhalation Daily   Continuous Infusions:  PRN Meds: acetaminophen, albuterol, HYDROcodone-acetaminophen,  morphine injection, nitroGLYCERIN, ondansetron (ZOFRAN) IV  Allergies:    Allergies  Allergen Reactions   Cefaclor Anaphylaxis, Rash and Swelling    Throat closed   Nitrofurantoin Anaphylaxis    Right kidney shut down macrobid   Chantix [Varenicline] Other (See Comments)    nightmares   Nsaids  Other (See Comments)    Due to blood thinner and asa use    Social History:   Social History   Socioeconomic History   Marital status: Married    Spouse name: Not on file   Number of children: Not on file   Years of education: Not on file   Highest education level: Not on file  Occupational History   Not on file  Tobacco Use   Smoking status: Former    Packs/day: 1.00    Years: 53.00    Pack years: 53.00    Types: Cigarettes    Quit date: 03/11/2018    Years since quitting: 2.8   Smokeless tobacco: Never  Vaping Use   Vaping Use: Never used  Substance and Sexual Activity   Alcohol use: No   Drug use: Never   Sexual activity: Yes  Other Topics Concern   Not on file  Social History Narrative   Not on file   Social Determinants of Health   Financial Resource Strain: Not on file  Food Insecurity: Not on file  Transportation Needs: Not on file  Physical Activity: Not on file  Stress: Not on file  Social Connections: Not on file  Intimate Partner Violence: Not on file    Family History:    Family History  Problem Relation Age of Onset   Hypertension Mother    Heart attack Other        uncle   Stroke Other    Stroke Maternal Grandmother    Stroke Maternal Grandfather    Stroke Paternal Grandmother    Stroke Paternal Grandfather      ROS:  Please see the history of present illness.   All other ROS reviewed and negative.     Physical Exam/Data:   Vitals:   12/28/20 1015 12/28/20 1030 12/28/20 1145 12/28/20 1200  BP: (!) 152/82 (!) 157/80 (!) 145/90 (!) 159/86  Pulse: 60 60 73 68  Resp: 20 (!) 21 (!) 29 (!) 21  Temp:      TempSrc:      SpO2: 94% 93% 95%  95%  Weight:      Height:       No intake or output data in the 24 hours ending 12/28/20 1324 Last 3 Weights 12/28/2020 08/13/2020 08/05/2020  Weight (lbs) 143 lb 4.8 oz 129 lb 6.4 oz 129 lb  Weight (kg) 65 kg 58.695 kg 58.514 kg     Body mass index is 21.16 kg/m.  General:  elderly male in mild distress HEENT: normal Neck: no JVD Vascular: No carotid bruits; Distal pulses 2+ bilaterally Cardiac:  normal S1, S2; RRR; no murmur  Lungs:  no crackles, poor air movement Abd: soft, nontender, no hepatomegaly  Ext: no edema Musculoskeletal:  No deformities, BUE and BLE strength normal and equal Skin: warm and dry  Neuro:  CNs 2-12 intact, no focal abnormalities noted Psych:  Normal affect   EKG:  The EKG was personally reviewed and demonstrates:  sinus rhythm HR 62, LVH, anterior Q waves Telemetry:  Telemetry was personally reviewed and demonstrates:  sinus rhythm HR 80s  Relevant CV Studies:  Nuclear stress test 06/05/19: Lexiscan stress without ST T Wave changes Myovue scan with normal perfusion, minimal apical thinning LVEF calculated at 49% though visual estimate suggests LVEF is greater This is a low risk study.  Laboratory Data:  High Sensitivity Troponin:   Recent Labs  Lab 12/28/20 0540 12/28/20 0745  TROPONINIHS 17 14  Chemistry Recent Labs  Lab 12/28/20 0540  NA 137  K 4.2  CL 103  CO2 27  GLUCOSE 103*  BUN 20  CREATININE 1.13  CALCIUM 9.3  GFRNONAA >60  ANIONGAP 7    No results for input(s): PROT, ALBUMIN, AST, ALT, ALKPHOS, BILITOT in the last 168 hours. Lipids No results for input(s): CHOL, TRIG, HDL, LABVLDL, LDLCALC, CHOLHDL in the last 168 hours.  Hematology Recent Labs  Lab 12/28/20 0540  WBC 13.9*  RBC 5.10  HGB 15.0  HCT 46.4  MCV 91.0  MCH 29.4  MCHC 32.3  RDW 13.4  PLT 322   Thyroid No results for input(s): TSH, FREET4 in the last 168 hours.  BNPNo results for input(s): BNP, PROBNP in the last 168 hours.  DDimer No results for  input(s): DDIMER in the last 168 hours.   Radiology/Studies:  DG Chest 2 View  Result Date: 12/28/2020 CLINICAL DATA:  Central chest pain with shortness of breath for 3 days EXAM: CHEST - 2 VIEW COMPARISON:  06/21/2020 FINDINGS: Large lung volumes with diaphragm flattening. There is history of COPD. 12 mm nodular density over the right upper lobe. An adjacent nodular density marked with an arrow has the shape of a button. No edema, effusion, or pneumothorax. Normal heart size and stable mediastinal contours. Abdominal aortic stent graft, minimally covered. IMPRESSION: 1. 12 mm nodule over the right upper lobe, recommend chest CT follow-up. 2. COPD without acute superimposed finding. Electronically Signed   By: Jorje Guild M.D.   On: 12/28/2020 05:57   CT Angio Chest PE W and/or Wo Contrast  Result Date: 12/28/2020 CLINICAL DATA:  Shortness of breath, chest pain EXAM: CT ANGIOGRAPHY CHEST WITH CONTRAST TECHNIQUE: Multidetector CT imaging of the chest was performed using the standard protocol during bolus administration of intravenous contrast. Multiplanar CT image reconstructions and MIPs were obtained to evaluate the vascular anatomy. CONTRAST:  183mL OMNIPAQUE IOHEXOL 350 MG/ML SOLN COMPARISON:  08/17/2011 FINDINGS: Cardiovascular: No filling defects in the pulmonary arteries to suggest pulmonary emboli. Heart is normal size. Aorta is normal caliber. Aortic and coronary artery calcifications. Mediastinum/Nodes: No mediastinal, hilar, or axillary adenopathy. Trachea and esophagus are unremarkable. Thyroid unremarkable. Lungs/Pleura: Moderate emphysema. Right upper lobe nodule measures 14 mm compared to 10 mm previously. The slow growth over the last 9 years suggests a benign process. Biapical scarring. No acute confluent opacities or effusions. Upper Abdomen: Imaging into the upper abdomen demonstrates no acute findings. Musculoskeletal: Chest wall soft tissues are unremarkable. No acute bony  abnormality. Review of the MIP images confirms the above findings. IMPRESSION: No evidence of pulmonary embolus. 14 mm right upper lobe pulmonary nodule, slightly increased in size from 10 mm since 2013. This slow of growth over the last 9 years suggests a benign process. Coronary artery disease. Aortic Atherosclerosis (ICD10-I70.0) and Emphysema (ICD10-J43.9). Electronically Signed   By: Rolm Baptise M.D.   On: 12/28/2020 08:38   CT ABDOMEN PELVIS W CONTRAST  Result Date: 12/28/2020 CLINICAL DATA:  Right lower quadrant pain EXAM: CT ABDOMEN AND PELVIS WITH CONTRAST TECHNIQUE: Multidetector CT imaging of the abdomen and pelvis was performed using the standard protocol following bolus administration of intravenous contrast. CONTRAST:  146mL OMNIPAQUE IOHEXOL 350 MG/ML SOLN COMPARISON:  07/22/2020 FINDINGS: Lower chest: No acute abnormality. Hepatobiliary: No focal hepatic abnormality. Gallbladder unremarkable. Pancreas: No focal abnormality or ductal dilatation. Spleen: No focal abnormality.  Normal size. Adrenals/Urinary Tract: 2 cm cyst in the midpole of the left kidney, unchanged. 2.5 cm  cyst off the lower pole of the left kidney, unchanged. Bilateral nephrolithiasis, stable. Punctate punctate calcifications noted along the posterior bladder wall near the UVJ bilaterally. It is unclear if these reflect layering stones or bladder wall calcifications. Adrenal glands unremarkable. Small low-density lesion noted in the upper pole of the right kidney, difficult to characterize but does not appear definitively cystic. Stomach/Bowel: Normal appendix. Stomach, large and small bowel grossly unremarkable. Vascular/Lymphatic: Prior stent graft repair of AAA. Aneurysm sac size measures 5.3 x 4.1 cm compared to 5.3 x 6.2 cm previously. No adenopathy. Reproductive: Prostate enlargement with calcifications. Other: No free fluid or free air. Musculoskeletal: No acute bony abnormality. IMPRESSION: Bilateral nephrolithiasis.   No hydronephrosis. Punctate calcifications along the posterior bladder wall near both UVJ. It is unclear if these reflect small layering stones or bladder wall calcifications. No hydronephrosis. 11 mm low-density lesion in the upper pole the right kidney which is not definitively cystic. This is difficult to characterize due to its small size and proximity to the liver. Consider further evaluation with nonemergent MRI for better characterization. Prior stent graft repair of AAA with decreasing size of the aneurysm sac. Prostate enlargement. Electronically Signed   By: Rolm Baptise M.D.   On: 12/28/2020 08:46     Assessment and Plan:   Chest pain CAD s/p DES to mid RCA 2015 - last nuclear stress test was nonischemic in 06/2019 - remains on DAPT following AAA repair - hs troponin x 2 negative - EKG with anterior Q waves noted on prior tracings, LVH - he describes chest tightness that followed onset of what sounds like PND - will await echocardiogram, may consider nuclear stress test tomorrow - NPO at [facility]   Dyspnea - pt appears visibly short of breath with conversation, but not hypoxic on room air - on exam, does not appear volume up - no acute process on CXR or CT chest - will collect a BNP and echocardiogram   Leukocytosis  - WBC 13.9 - CXR with no acute process - consider UA   Lung nodule COPD - 12 mm RUL on CXR   Hyperlipidemia 08/02/2020: Cholesterol, Total 194; HDL 52; LDL Chol Calc (NIH) 114; Triglycerides 157 - continue 80 mg lipitor, omega 3   AAA s/p repair - followed by [facility] - on DAPT with ASA and plavix    Risk Assessment/Risk Scores:     HEAR Score (for undifferentiated chest pain):  HEAR Score: 5{       For questions or updates, please contact Pray Please consult [email or web address] for contact info under    Signed, Ledora Bottcher, [place]  12/28/2020 1:24 PM   Patient seen and examined. Agree with assessment and plan. Connor Cuevas is a 80 year old  gentleman who has known CAD and underwent DES stenting to his RCA in October 2015.  He is status post abdominal aortic aneurysm endovascular repair surgery or AAA at 5.9 cm in April 2022.  He has a history of hyperlipidemia as well as COPD.  Over the past 5 days, he has noticed some increasing shortness of breath as well as a vague sensation of chest discomfort.  More recently he has noticed increasing shortness of breath with lying flat consistent with PND.  Reportedly, his son described a 25 pound weight gain over the past 6 weeks.  Upon arrival to the emergency room he was significantly hypertensive and was given sublingual nitroglycerin which improved blood pressure but induced nitrate headache.  Presently in the emergency room he  denies any chest pain.  He is tachycardic with a pulse around 108.  O2 saturation was 98% on room air.  There was no significant JVD.  He was kyphotic.  He had decreased breath sounds at his bases.  There was regular around 100.  There was a suggestion of soft S3.  There was 1/6 systolic murmur.  He did not have significant hepatojugular reflux.'s are present.  There was no edema.  Neurologic evaluation was grossly nonfocal.  Oratory is notable for BUN 20 creatinine 1.13.  White blood count 13.9, hemoglobin 15/hematocrit 46.4.  Influenza negative.  Chest negative for PE, presence of CAD and aortic atherosclerosis.  Pelvic CT shows bilateral nephrolithiasis without hydronephrosis.  High-sensitivity troponins are negative x2 at 17 and 14.  ECG reveals normal sinus rhythm at 62 with borderline LVH.  No ST T wave abnormalities.  I have recommended that the patient undergo a 2D echo Doppler study for further evaluation of his systolic and diastolic function particularly with his symptom complex suggestive of paroxysmal nocturnal dyspnea.  Will check BNP level to assess for systolic or diastolic heart failure.  We will obtain serial ECG.  Blood pressure was significantly elevated on  presentation.  He previously had a Lexiscan Myoview study in March 2021 with normal perfusion and probable normal variant apical thinning.  Will initiate oral nitrate therapy, consider changing from atenolol to metoprolol succinate.  With hypertension, consider adding amlodipine low-dose initially.  He continues to be on DAPT with aspirin/Plavix.  Further recommendations pending results of echo Doppler assessment of systolic and diastolic function.  Connor Sine, MD, [facility] 12/28/2020 2:58 PM

## 2020-12-28 NOTE — H&P (Addendum)
History and Physical    DOA: 12/28/2020  PCP: [name], MD  Patient coming from: home  Chief Complaint: Chest pain  HPI: Connor Cuevas is a 80 y.o. male with history h/o CAD, COPD, hyperlipidemia, interstitial lung disease, AAA with endovascular stent graft 06/2020, iliac stenosis, TRUBT 08/2020 for bladder cancer/low grade papillary urothelial carcinoma, presents with concern for chest pain. Patient reports taking decongestants for chronic sinusitis almost regularly.  2 weeks back he had exacerbation of upper respiratory symptoms with flulike symptoms and nasal congestion.  He has been taking Sudafed daily since then.  He did see his PCP last week and had COVID test negative.  He is up-to-date with COVID vaccinations/booster.  He is yet to get his flu shot this year.  He has been visiting his wife in the hospital and noted worsening exertional dyspnea with chest tightness over the last 2 days associated with tingling in fingers and toes.  Denies any chest pain per se or radiation or associated nausea or vomiting but does report palpitations which he feels mostly in his belly (patient had AAA repair recently).  Symptoms are relieved with rest.  Last night he could not get comfortable and continued to feel chest tightness on laying down, tried drinking coffee, using albuterol inhaler and tried resting with no relief which brought him to the ED.  His last stress test was last year and normal.  He did receive aspirin 324 mg and 3 sublingual nitro doses via EMS which made him feel better. Patient currently appears comfortable laying down on ED stretcher.  Son at bedside.  Patient reports compliance with home medications including aspirin and Plavix that he took this morning. ED course: Afebrile, SBP 1 40-1 60s currently, respiratory rate 15-29 while in ED, pulse 68.  WBC 13.9, hemoglobin 15, hematocrit 46.4, platelets 322, sodium 137, potassium 4.2, bicarb 27, BUN 20, creatinine 1.13.  BG 103.  Troponin  I HS 14-17.  EKG LVH pattern with nonspecific T wave changes.  CTA chest negative for PE but showed 14 mm right pulmonary nodule.  Case was discussed with cardiology who recommended to admit for rule out ACS, okay to hold off on heparin drip.  Review of Systems: As per HPI, otherwise review of systems negative.    Past Medical History:  Diagnosis Date   AAA (abdominal aortic aneurysm) ([place]) 04/16/2018   4.3 cm, noted on [place]    Acute kidney failure ([place]) 2005 approx   after ureteroscopy, hospitalized for about 10 days afterwards   Anxiety    Arthritis    Hands, wrist, elbow, back and spine oa   Bladder mass    Blind right eye since 07-2019   Bulging lumbar disc    L4-L5 degeneration, Chronic back pain   CAD (coronary artery disease)    a. 01/09/14: [place] s/p DES to RCA    COPD (chronic obstructive pulmonary disease) ([place])    wheezing   Dyspnea    with exertion   Glaucoma of both eyes    Hepatic cyst 2013   Several tiny hepatic cysts , noted on CT   History of kidney stones    small kidney stones in right kidney   Hyperlipidemia    Interstitial lung disease ([place])    managed by pcp dr Memory Dance swayne   Pneumonia yrs ago   Pulmonary nodule, right 08/02/2016   a. 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 Aortosclerosis, bilateral - mild with calcification. Per Dr. Moreen Fowler at [place] [facility]    Wears dentures    upper    Wears glasses     Past Surgical History:  Procedure Laterality Date   ABDOMINAL AORTIC ENDOVASCULAR STENT GRAFT  06/21/2020   Procedure: ABDOMINAL AORTIC ENDOVASCULAR STENT GRAFT;  Surgeon: Marty Heck, MD;  Location: [facility];  Service: Vascular;;   CARDIAC CATHETERIZATION  2017   right carotid stent   CATARACT EXTRACTION W/ INTRAOCULAR LENS  IMPLANT, BILATERAL  1997 (APPROX)   CATARACT EXTRACTION W/PHACO Left  03/31/2020   Procedure: CATARACT EXTRACTION PHACO AND INTRAOCULAR LENS PLACEMENT (IOC) LEFT 5.51 01:16.1 7.2%;  Surgeon: Leandrew Koyanagi, MD;  Location: [facility];  Service: Ophthalmology;  Laterality: Left;   COLONOSCOPY  2017   CYSTOSCOPY W/ URETERAL STENT PLACEMENT Bilateral 08/13/2020   Procedure: CYSTOSCOPY WITH BILATERAL RETROGRADE PYELOGRAM;  Surgeon: Janith Lima, MD;  Location: [facility];  Service: Urology;  Laterality: Bilateral;   CYSTOSCOPY/RETROGRADE/URETEROSCOPY/STONE EXTRACTION WITH BASKET  10/17/2011   Procedure: CYSTOSCOPY/RETROGRADE/URETEROSCOPY/STONE EXTRACTION WITH BASKET;  Surgeon: Hanley Ben, MD;  Location: [facility];  Service: Urology;  Laterality: Right;   CYSTOSCOPY/URETEROSCOPY/HOLMIUM LASER/STENT PLACEMENT Right 05/13/2018   Procedure: CYSTOSCOPY/RETROGRADE/URETEROSCOPY/HOLMIUM LASER;  Surgeon: Kathie Rhodes, MD;  Location: [facility];  Service: Urology;  Laterality: Right;   HERNIA REPAIR  15 yrs ago   bil inguinal   LAPAROSCOPIC LEFT INGUINAL (OBTURATOR) HERNIA AND UMBILICAL HERNIA REPAIR  01-13-2011   LEFT HEART CATHETERIZATION WITH CORONARY ANGIOGRAM N/A 01/09/2014   Procedure: LEFT HEART CATHETERIZATION WITH CORONARY ANGIOGRAM;  Surgeon: Josue Hector, MD;  Location: [facility] C[facility];  Service: Cardiovascular;  Laterality: N/A;   LEFT URETEROSCOPIC STONE EXTRACTION  11-30-2000   W/ PREVIOUS ESWL AND URETERAL STENT PLACEMENT   PHOTOCOAGULATION Right 12/10/2019   Procedure: TRANSCLEARAL DIODE CYCLOPHOTOCOAGULATION RIGHT;  Surgeon: Leandrew Koyanagi, MD;  Location: [place];  Service: Ophthalmology;  Laterality: Right;   REPAIR RIGHT INGUINAL HERNIA W/ MESH  12-31-2000   TONSILLECTOMY  age 89   TRANSURETHRAL RESECTION OF BLADDER TUMOR N/A 08/13/2020   Procedure: TRANSURETHRAL RESECTION OF BLADDER TUMOR (TURBT) WITH POSTOPERATIVE INSTILLATION OF GEMCITABINE;  Surgeon: Janith Lima, MD;   Location: [facility];  Service: Urology;  Laterality: N/A;  GENERAL ANESTHESIA WITH INTUBATED, PARALYSIS   ULTRASOUND GUIDANCE FOR VASCULAR ACCESS Bilateral 06/21/2020   Procedure: ULTRASOUND GUIDANCE FOR VASCULAR ACCESS;  Surgeon: Marty Heck, MD;  Location: [facility];  Service: Vascular;  Laterality: Bilateral;   URETEROLITHOTOMY  2009    Social history:  reports that he quit smoking about 2 years ago. His smoking use included cigarettes. He has a 53.00 pack-year smoking history. He has never used smokeless tobacco. He reports that he does not drink alcohol and does not use drugs.   Allergies  Allergen Reactions   Cefaclor Anaphylaxis, Rash and Swelling    Throat closed   Nitrofurantoin Anaphylaxis    Right kidney shut down macrobid   Chantix [Varenicline] Other (See Comments)    nightmares   Nsaids Other (See Comments)    Due to blood thinner and asa use    Family History  Problem Relation Age of Onset   Hypertension Mother    Heart attack Other        uncle   Stroke Other  Stroke Maternal Grandmother    Stroke Maternal Grandfather    Stroke Paternal Grandmother    Stroke Paternal Grandfather       Prior to Admission medications   Medication Sig Start Date End Date Taking? Authorizing Provider  aspirin EC 81 MG EC tablet Take 1 tablet (81 mg total) by mouth daily at 6 (six) AM. Swallow whole. 06/23/20   Rhyne, Hulen Shouts, PA-C  atenolol (TENORMIN) 25 MG tablet Take 1 tablet (25 mg total) by mouth daily. 05/04/20   Josue Hector, MD  atorvastatin (LIPITOR) 80 MG tablet Take 1 tablet (80 mg total) by mouth every morning. 05/04/20   Josue Hector, MD  brimonidine (ALPHAGAN P) 0.1 % SOLN Place 1 drop into both eyes 2 (two) times daily.    [provider]  clopidogrel (PLAVIX) 75 MG tablet Take 1 tablet by mouth once daily 09/15/20   Josue Hector, MD  Coenzyme Q10 (CO Q 10) 100 MG CAPS Take 100 mg by mouth daily.    [provider]   gabapentin (NEURONTIN) 100 MG capsule Take 1 capsule (100 mg total) by mouth 3 (three) times daily. Patient taking differently: Take 100 mg by mouth as needed. 10/30/19   Magnant, Charles L, PA-C  GARLIC PO Take by mouth daily.    [provider]  HYDROcodone-acetaminophen (NORCO/VICODIN) 5-325 MG tablet Take 1 tablet by mouth every 6 (six) hours as needed for moderate pain. 08/13/20   Janith Lima, MD  latanoprost (XALATAN) 0.005 % ophthalmic solution Place 1 drop into both eyes at bedtime.    [provider]  methocarbamol (ROBAXIN) 500 MG tablet TAKE 1 TABLET BY MOUTH EVERY 8 HOURS AS NEEDED Patient not taking: No sig reported 08/29/19   Magnant, Charles L, PA-C  Multiple Vitamin (MULTIVITAMIN) tablet Take 1 tablet by mouth daily.    [provider]  Multiple Vitamins-Minerals (ICAPS AREDS FORMULA PO) Take 1 tablet by mouth 2 (two) times daily.    [provider]  nitroGLYCERIN (NITROSTAT) 0.4 MG SL tablet Place 1 tablet (0.4 mg total) under the tongue every 5 (five) minutes as needed for chest pain. 06/12/19   Josue Hector, MD  OMEGA-3 KRILL OIL PO Take 200 mg by mouth daily.    [provider]  PROAIR HFA 108 [phone] Base) MCG/ACT inhaler Inhale 2 puffs into the lungs every 4 (four) hours as needed for wheezing. 09/16/18   [provider]  pseudoephedrine (SUDAFED) 120 MG 12 hr tablet Take 120 mg by mouth daily as needed for congestion.    [provider]  tiotropium (SPIRIVA) 18 MCG inhalation capsule Place 18 mcg into inhaler and inhale every morning.    [provider]  traZODone (DESYREL) 100 MG tablet Take 100 mg by mouth at bedtime.  03/10/15   [provider]  VITAMIN D, CHOLECALCIFEROL, PO Take by mouth daily.    [provider]    Physical Exam: Vitals:   12/28/20 0854 12/28/20 0900 12/28/20 0945 12/28/20 1015  BP: (!) 155/68 (!) 146/85 (!) 148/81 (!) 152/82  Pulse: 63 66 61 60  Resp: 20 19 15  20   Temp:      TempSrc:      SpO2: 95% 93% 96% 94%  Weight:      Height:        Constitutional: Thin built male, NAD, calm, comfortable Eyes: PERRL, lids and conjunctivae normal ENMT: Mucous membranes are moist. Posterior pharynx clear of any exudate or  lesions.Normal dentition.  Neck: normal, supple, no masses, no thyromegaly Respiratory: clear to auscultation bilaterally, no wheezing, no crackles. Normal respiratory effort. No accessory muscle use.  Cardiovascular: Regular rate and rhythm, no murmurs / rubs / gallops. No extremity edema. 2+ pedal pulses. No carotid bruits.  Abdomen: Reproducible epigastric and mid abdominal tenderness with voluntary guarding but no spontaneous guarding/rebound, no masses palpated. No hepatosplenomegaly. Bowel sounds positive.  Musculoskeletal: no clubbing / cyanosis. No joint deformity upper and lower extremities. Good ROM, no contractures. Normal muscle tone.  Neurologic: CN 2-12 grossly intact. Sensation intact, DTR normal. Strength 5/5 in all 4.  Psychiatric: Normal judgment and insight. Alert and oriented x 3. Normal mood.  SKIN/catheters: no rashes, lesions, ulcers. No induration  Labs on Admission: I have personally reviewed following labs and imaging studies  CBC: Recent Labs  Lab 12/28/20 0540  WBC 13.9*  HGB 15.0  HCT 46.4  MCV 91.0  PLT 188   Basic Metabolic Panel: Recent Labs  Lab 12/28/20 0540  NA 137  K 4.2  CL 103  CO2 27  GLUCOSE 103*  BUN 20  CREATININE 1.13  CALCIUM 9.3   GFR: Estimated Creatinine Clearance: 49.5 mL/min (by C-G formula based on SCr of 1.13 mg/dL). Recent Labs  Lab 12/28/20 0540  WBC 13.9*   Liver Function Tests: No results for input(s): AST, ALT, ALKPHOS, BILITOT, PROT, ALBUMIN in the last 168 hours. No results for input(s): LIPASE, AMYLASE in the last 168 hours. No results for input(s): AMMONIA in the last 168 hours. Coagulation Profile: Recent Labs  Lab 12/28/20 0540  INR 1.0    Cardiac Enzymes: No results for input(s): CKTOTAL, CKMB, CKMBINDEX, TROPONINI in the last 168 hours. BNP (last 3 results) No results for input(s): PROBNP in the last 8760 hours. HbA1C: No results for input(s): HGBA1C in the last 72 hours. CBG: No results for input(s): GLUCAP in the last 168 hours. Lipid Profile: No results for input(s): CHOL, HDL, LDLCALC, TRIG, CHOLHDL, LDLDIRECT in the last 72 hours. Thyroid Function Tests: No results for input(s): TSH, T4TOTAL, FREET4, T3FREE, THYROIDAB in the last 72 hours. Anemia Panel: No results for input(s): VITAMINB12, FOLATE, FERRITIN, TIBC, IRON, RETICCTPCT in the last 72 hours. Urine analysis:    Component Value Date/Time   COLORURINE AMBER (A) 08/24/2020 2015   APPEARANCEUR HAZY (A) 08/24/2020 2015   LABSPEC 1.009 08/24/2020 2015   PHURINE 7.0 08/24/2020 2015   GLUCOSEU NEGATIVE 08/24/2020 2015   HGBUR LARGE (A) 08/24/2020 2015   BILIRUBINUR NEGATIVE 08/24/2020 2015   [place] 08/24/2020 2015   PROTEINUR 30 (A) 08/24/2020 2015   UROBILINOGEN 0.2 08/24/2011 0640   NITRITE POSITIVE (A) 08/24/2020 2015   LEUKOCYTESUR NEGATIVE 08/24/2020 2015    Radiological Exams on Admission: Personally reviewed  DG Chest 2 View  Result Date: 12/28/2020 CLINICAL DATA:  Central chest pain with shortness of breath for 3 days EXAM: CHEST - 2 VIEW COMPARISON:  06/21/2020 FINDINGS: Large lung volumes with diaphragm flattening. There is history of COPD. 12 mm nodular density over the right upper lobe. An adjacent nodular density marked with an arrow has the shape of a button. No edema, effusion, or pneumothorax. Normal heart size and stable mediastinal contours. Abdominal aortic stent graft, minimally covered. IMPRESSION: 1. 12 mm nodule over the right upper lobe, recommend chest CT follow-up. 2. COPD without acute superimposed finding. Electronically Signed   By: Jorje Guild M.D.   On: 12/28/2020 05:57   CT Angio Chest PE W and/or Wo  Contrast  Result Date: 12/28/2020 CLINICAL DATA:  Shortness of breath, chest pain EXAM: CT ANGIOGRAPHY CHEST WITH CONTRAST TECHNIQUE: Multidetector CT imaging of the chest was performed using the standard protocol during bolus administration of intravenous contrast. Multiplanar CT image reconstructions and MIPs were obtained to evaluate the vascular anatomy. CONTRAST:  133mL OMNIPAQUE IOHEXOL 350 MG/ML SOLN COMPARISON:  08/17/2011 FINDINGS: Cardiovascular: No filling defects in the pulmonary arteries to suggest pulmonary emboli. Heart is normal size. Aorta is normal caliber. Aortic and coronary artery calcifications. Mediastinum/Nodes: No mediastinal, hilar, or axillary adenopathy. Trachea and esophagus are unremarkable. Thyroid unremarkable. Lungs/Pleura: Moderate emphysema. Right upper lobe nodule measures 14 mm compared to 10 mm previously. The slow growth over the last 9 years suggests a benign process. Biapical scarring. No acute confluent opacities or effusions. Upper Abdomen: Imaging into the upper abdomen demonstrates no acute findings. Musculoskeletal: Chest wall soft tissues are unremarkable. No acute bony abnormality. Review of the MIP images confirms the above findings. IMPRESSION: No evidence of pulmonary embolus. 14 mm right upper lobe pulmonary nodule, slightly increased in size from 10 mm since 2013. This slow of growth over the last 9 years suggests a benign process. Coronary artery disease. Aortic Atherosclerosis (ICD10-I70.0) and Emphysema (ICD10-J43.9). Electronically Signed   By: Rolm Baptise M.D.   On: 12/28/2020 08:38   CT ABDOMEN PELVIS W CONTRAST  Result Date: 12/28/2020 CLINICAL DATA:  Right lower quadrant pain EXAM: CT ABDOMEN AND PELVIS WITH CONTRAST TECHNIQUE: Multidetector CT imaging of the abdomen and pelvis was performed using the standard protocol following bolus administration of intravenous contrast. CONTRAST:  179mL OMNIPAQUE IOHEXOL 350 MG/ML SOLN COMPARISON:  07/22/2020  FINDINGS: Lower chest: No acute abnormality. Hepatobiliary: No focal hepatic abnormality. Gallbladder unremarkable. Pancreas: No focal abnormality or ductal dilatation. Spleen: No focal abnormality.  Normal size. Adrenals/Urinary Tract: 2 cm cyst in the midpole of the left kidney, unchanged. 2.5 cm cyst off the lower pole of the left kidney, unchanged. Bilateral nephrolithiasis, stable. Punctate punctate calcifications noted along the posterior bladder wall near the UVJ bilaterally. It is unclear if these reflect layering stones or bladder wall calcifications. Adrenal glands unremarkable. Small low-density lesion noted in the upper pole of the right kidney, difficult to characterize but does not appear definitively cystic. Stomach/Bowel: Normal appendix. Stomach, large and small bowel grossly unremarkable. Vascular/Lymphatic: Prior stent graft repair of AAA. Aneurysm sac size measures 5.3 x 4.1 cm compared to 5.3 x 6.2 cm previously. No adenopathy. Reproductive: Prostate enlargement with calcifications. Other: No free fluid or free air. Musculoskeletal: No acute bony abnormality. IMPRESSION: Bilateral nephrolithiasis.  No hydronephrosis. Punctate calcifications along the posterior bladder wall near both UVJ. It is unclear if these reflect small layering stones or bladder wall calcifications. No hydronephrosis. 11 mm low-density lesion in the upper pole the right kidney which is not definitively cystic. This is difficult to characterize due to its small size and proximity to the liver. Consider further evaluation with nonemergent MRI for better characterization. Prior stent graft repair of AAA with decreasing size of the aneurysm sac. Prostate enlargement. Electronically Signed   By: Rolm Baptise M.D.   On: 12/28/2020 08:46    EKG: Independently reviewed. NSR , LVH pattern-non specific T wave changes     Assessment and Plan:   Principal Problem:   Ischemic chest pain ([place]) Active Problems:   CAD (coronary  artery disease)   Anxiety state   Unspecified glaucoma   Aortic valve disorder   Hyperlipidemia   COPD (chronic obstructive  pulmonary disease) ([place])   AAA (abdominal aortic aneurysm) ([facility])   Stenosis of iliac artery ([facility])   Tobacco abuse   Bladder tumor    1.  Chest pain: Rule out ACS.  Patient has history of CAD s/p PTCA to RCA and on DAPT.  Troponin x2 and EKG so far reassuring.  Could be vasospastic angina related to Sudafed use.  Relieved by nitro given by EMS.  Will place on Nitropatch and resume home medications including aspirin, Plavix, beta-blockers, statins.  Chest x-ray, CTA chest negative for pneumonia/PE or other acute findings.  N.p.o. after midnight in case cardiology recommends NST.  Hold off on heparin unless cardiology recommends.  Advised patient to absolutely avoid OTC ephedrine products-can utilize hydroxyzine/Mucinex as needed instead.  2.  Hypertension: Blood pressure currently elevated, should be able to tolerate Nitropatch.  Will change to Imdur if experiences severe headache.  Resume other home antihypertensives.  3.  Leukocytosis: Could be reactive from recent URI and underlying chronic sinusitis.  Recently completed antibiotic course.  COVID and flu test resulted negative now.  Repeat CBC in AM.  Afebrile.    4.  Pulmonary nodule: CTA chest reported 14 mm right pulmonary nodule (increased from 10 mm over 9 years-indicative of benign process per radiologist).  Follow-up PCP.  5.  Abdominal discomfort, palpitations: CT abdomen showed nonspecific findings including bilateral nephrolithiasis without obstruction or hydronephrosis.Also reported "punctate calcifications along the posterior bladder wall near both UVJ. It is unclear if these reflect small layering stones or bladder wall calcifications. 11 mm low-density lesion in the upper pole the right kidney-difficult to characterize due to its small size and proximity to the liver. Consider further evaluation with  nonemergent MRI for better characterization.Prior stent graft repair of AAA with decreasing size of the aneurysm sac.Prostate enlargement."  Check PVR  6.  COPD: Quit tobacco use 2 years back.  Not on home O2.  May need walking desat studies if cardiology work-up negative as reports tingling in extremities associated with dyspnea on exertion which could indicate exertional hypoxia.  No wheezing currently, resume home inhaler therapy  7.  History of bladder tumor: S/p resection.  CT reports prostrate enlargement but no bladder abnormalities.  8.  Anxiety: Resume home medications    DVT prophylaxis: Lovenox  COVID screen: Negative  Code Status: Full code.Health care proxy would be son-Rand  Patient/Family Communication: Discussed with patient and all questions answered to satisfaction.  Consults called: [facility] cardiology Admission status :Patient will be admitted under OBSERVATION status.The patient's presenting symptoms, physical exam findings, and initial radiographic and laboratory data in the context of their medical condition is felt to place them at low risk for further clinical deterioration. Furthermore, it is anticipated that the patient will be medically stable for discharge from the hospital within 2 midnights of hospital stay.       Guilford Shi MD [facility] Pager in [place]  If 7PM-7AM, please contact night-coverage [email or web address]   12/28/2020, 10:40 AM

## 2020-12-28 NOTE — ED Provider Notes (Signed)
[facility] EMERGENCY DEPARTMENT Provider Note   CSN: [id number] Arrival date & time: 12/28/20  5188     History Chief Complaint  Patient presents with   Chest Pain    Connor Cuevas is a 80 y.o. male.  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   Reports that he has had intermittent chest pain over the last couple of days with significant worsening last night between 10 PM and 1 AM.  His wife was admitted to the hospital over the weekend.  He reports he had some intermittent pain, that he reports was worse with ambulation, for example walking into the hospital to visit his wife, however last night it became more severe and persisted even when he was resting.  Describes it as a tightness and a pressure-like pain in the center of the chest, without radiation to the back, arm, neck or jaw.  Had associated severe dyspnea.  This morning, he was given nitroglycerin by EMS and reports that improved his chest pain significantly and lowered his blood pressures, however his shortness of breath has persisted and he still has very mild tightness.  Denies associated nausea, vomiting, diaphoresis.  He reports he also has right lower quadrant abdominal pain that began around the same time as his chest pain.  Discusses his history of bladder tumor removal and AAA repair.  Has pain in his bilateral calves.  Also reports a cough present for about 4 days.  Reports he had a cough several weeks ago 3 weeks ago was placed on amoxicillin by his doctor, but that improved until the last 4 days.  Reports this is mild.  Denies any fevers  Past Medical History:  Diagnosis Date   AAA (abdominal aortic aneurysm) ([place]) 04/16/2018   4.3 cm, noted on [place]    Acute kidney failure ([place])  2005 approx   after ureteroscopy, hospitalized for about 10 days afterwards   Anxiety    Arthritis    Hands, wrist, elbow, back and spine oa   Bladder mass    Blind right eye since 07-2019   Bulging lumbar disc    L4-L5 degeneration, Chronic back pain   CAD (coronary artery disease)    a. 01/09/14: [place] s/p DES to RCA    COPD (chronic obstructive pulmonary disease) ([place])    wheezing   Dyspnea    with exertion   Glaucoma of both eyes    Hepatic cyst 2013   Several tiny hepatic cysts , noted on CT   History of kidney stones    small kidney stones in right kidney   Hyperlipidemia    Interstitial lung disease ([place])    managed by pcp dr Memory Dance swayne   Pneumonia yrs ago   Pulmonary nodule, right 08/02/2016   a. 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 Aortosclerosis, bilateral - mild with calcification. Per Dr. Moreen Fowler at [facility] abuse    Wears dentures    upper    Wears glasses     Patient Active Problem List   Diagnosis Date Noted   Unstable angina ([place])  01/10/2014   Hyperlipidemia    CAD (coronary artery disease)    COPD (chronic obstructive pulmonary disease) (HCC)    AAA (abdominal aortic aneurysm) (HCC)    Stenosis of iliac artery (HCC)    Tobacco abuse    GLAUCOMA, RIGHT EYE 09/28/2008   Pulmonary nodule 09/28/2008   ANXIETY 09/25/2008   Aortic valve disorder 09/25/2008    Past Surgical History:  Procedure Laterality Date   ABDOMINAL AORTIC ENDOVASCULAR STENT GRAFT  06/21/2020   Procedure: ABDOMINAL AORTIC ENDOVASCULAR STENT GRAFT;  Surgeon: Marty Heck, MD;  Location: [place];  Service: Vascular;;   CARDIAC CATHETERIZATION  2017   right carotid stent   CATARACT EXTRACTION W/ INTRAOCULAR LENS  IMPLANT, BILATERAL  1997 (APPROX)   CATARACT EXTRACTION W/PHACO Left 03/31/2020   Procedure: CATARACT EXTRACTION  PHACO AND INTRAOCULAR LENS PLACEMENT (IOC) LEFT 5.51 01:16.1 7.2%;  Surgeon: Leandrew Koyanagi, MD;  Location: [place];  Service: Ophthalmology;  Laterality: Left;   COLONOSCOPY  2017   CYSTOSCOPY W/ URETERAL STENT PLACEMENT Bilateral 08/13/2020   Procedure: CYSTOSCOPY WITH BILATERAL RETROGRADE PYELOGRAM;  Surgeon: Janith Lima, MD;  Location: [facility];  Service: Urology;  Laterality: Bilateral;   CYSTOSCOPY/RETROGRADE/URETEROSCOPY/STONE EXTRACTION WITH BASKET  10/17/2011   Procedure: CYSTOSCOPY/RETROGRADE/URETEROSCOPY/STONE EXTRACTION WITH BASKET;  Surgeon: Hanley Ben, MD;  Location: [place];  Service: Urology;  Laterality: Right;   CYSTOSCOPY/URETEROSCOPY/HOLMIUM LASER/STENT PLACEMENT Right 05/13/2018   Procedure: CYSTOSCOPY/RETROGRADE/URETEROSCOPY/HOLMIUM LASER;  Surgeon: Kathie Rhodes, MD;  Location: [facility];  Service: Urology;  Laterality: Right;   HERNIA REPAIR  15 yrs ago   bil inguinal   LAPAROSCOPIC LEFT INGUINAL (OBTURATOR) HERNIA AND UMBILICAL HERNIA REPAIR  01-13-2011   LEFT HEART CATHETERIZATION WITH CORONARY ANGIOGRAM N/A 01/09/2014   Procedure: LEFT HEART CATHETERIZATION WITH CORONARY ANGIOGRAM;  Surgeon: Josue Hector, MD;  Location: [facility] CATH LAB;  Service: Cardiovascular;  Laterality: N/A;   LEFT URETEROSCOPIC STONE EXTRACTION  11-30-2000   W/ PREVIOUS ESWL AND URETERAL STENT PLACEMENT   PHOTOCOAGULATION Right 12/10/2019   Procedure: TRANSCLEARAL DIODE CYCLOPHOTOCOAGULATION RIGHT;  Surgeon: Leandrew Koyanagi, MD;  Location: [place];  Service: Ophthalmology;  Laterality: Right;   REPAIR RIGHT INGUINAL HERNIA W/ MESH  12-31-2000   TONSILLECTOMY  age 13   TRANSURETHRAL RESECTION OF BLADDER TUMOR N/A 08/13/2020   Procedure: TRANSURETHRAL RESECTION OF BLADDER TUMOR (TURBT) WITH POSTOPERATIVE INSTILLATION OF GEMCITABINE;  Surgeon: Janith Lima, MD;  [facility];  Service:  Urology;  Laterality: N/A;  GENERAL ANESTHESIA WITH INTUBATED, PARALYSIS   ULTRASOUND GUIDANCE FOR VASCULAR ACCESS Bilateral 06/21/2020   Procedure: ULTRASOUND GUIDANCE FOR VASCULAR ACCESS;  Surgeon: Marty Heck, MD;  Location: [facility] OR;  Service: Vascular;  Laterality: Bilateral;   URETEROLITHOTOMY  2009       Family History  Problem Relation Age of Onset   Hypertension Mother    Heart attack Other        uncle   Stroke Other    Stroke Maternal Grandmother    Stroke Maternal Grandfather    Stroke Paternal Grandmother    Stroke Paternal Grandfather     Social History   Tobacco Use   Smoking status: Former    Packs/day: 1.00    Years: 53.00    Pack years: 53.00    Types: Cigarettes    Quit date: 03/11/2018    Years since quitting: 2.8   Smokeless tobacco: Never  Vaping Use   Vaping Use: Never used  Substance  Use Topics   Alcohol use: No   Drug use: Never    Home Medications Prior to Admission medications   Medication Sig Start Date End Date Taking? Authorizing Provider  aspirin EC 81 MG EC tablet Take 1 tablet (81 mg total) by mouth daily at 6 (six) AM. Swallow whole. 06/23/20   Rhyne, Hulen Shouts, PA-C  atenolol (TENORMIN) 25 MG tablet Take 1 tablet (25 mg total) by mouth daily. 05/04/20   Josue Hector, MD  atorvastatin (LIPITOR) 80 MG tablet Take 1 tablet (80 mg total) by mouth every morning. 05/04/20   Josue Hector, MD  brimonidine (ALPHAGAN P) 0.1 % SOLN Place 1 drop into both eyes 2 (two) times daily.    [provider]  clopidogrel (PLAVIX) 75 MG tablet Take 1 tablet by mouth once daily 09/15/20   Josue Hector, MD  Coenzyme Q10 (CO Q 10) 100 MG CAPS Take 100 mg by mouth daily.    [provider]  gabapentin (NEURONTIN) 100 MG capsule Take 1 capsule (100 mg total) by mouth 3 (three) times daily. Patient taking differently: Take 100 mg by mouth as needed. 10/30/19   Magnant, Charles L, PA-C  GARLIC PO Take by mouth daily.    [provider]  HYDROcodone-acetaminophen (NORCO/VICODIN) 5-325 MG tablet Take 1 tablet by mouth every 6 (six) hours as needed for moderate pain. 08/13/20   Janith Lima, MD  latanoprost (XALATAN) 0.005 % ophthalmic solution Place 1 drop into both eyes at bedtime.    [provider]  methocarbamol (ROBAXIN) 500 MG tablet TAKE 1 TABLET BY MOUTH EVERY 8 HOURS AS NEEDED Patient not taking: No sig reported 08/29/19   Magnant, Charles L, PA-C  Multiple Vitamin (MULTIVITAMIN) tablet Take 1 tablet by mouth daily.    [provider]  Multiple Vitamins-Minerals (ICAPS AREDS FORMULA PO) Take 1 tablet by mouth 2 (two) times daily.    [provider]  nitroGLYCERIN (NITROSTAT) 0.4 MG SL tablet Place 1 tablet (0.4 mg total) under the tongue every 5 (five) minutes as needed for chest pain. 06/12/19   Josue Hector, MD  OMEGA-3 KRILL OIL PO Take 200 mg by mouth daily.    [provider]  PROAIR HFA 108 [phone] Base) MCG/ACT inhaler Inhale 2 puffs into the lungs every 4 (four) hours as needed for wheezing. 09/16/18   [provider]  pseudoephedrine (SUDAFED) 120 MG 12 hr tablet Take 120 mg by mouth daily as needed for congestion.    [provider]  tiotropium (SPIRIVA) 18 MCG inhalation capsule Place 18 mcg into inhaler and inhale every morning.    [provider]  traZODone (DESYREL) 100 MG tablet Take 100 mg by mouth at bedtime.  03/10/15   [provider]  VITAMIN D, CHOLECALCIFEROL, PO Take by mouth daily.    [provider]    Allergies    Cefaclor, Nitrofurantoin, Chantix [varenicline], and Nsaids  Review of Systems   Review of Systems  Constitutional:  Negative for fever.  HENT:  Negative for sore throat.   Eyes:  Negative for visual disturbance.  Respiratory:  Positive for cough and shortness of breath.   Cardiovascular:  Negative for chest pain.  Gastrointestinal:  Positive for abdominal pain. Negative for constipation,  diarrhea, nausea and vomiting.  Genitourinary:  Negative for difficulty urinating.  Musculoskeletal:  Positive for back pain. Negative for neck stiffness.  Skin:  Negative for rash.  Neurological:  Negative for syncope and  headaches.   Physical Exam Updated Vital Signs BP (!) 148/81   Pulse 61   Temp 98.6 F (37 C) (Oral)   Resp 15   Ht 5\' 9"  (1.753 m)   Wt 65 kg   SpO2 96%   BMI 21.16 kg/m   Physical Exam Vitals and nursing note reviewed.  Constitutional:      General: He is not in acute distress.    Appearance: He is well-developed. He is not diaphoretic.  HENT:     Head: Normocephalic and atraumatic.  Eyes:     Conjunctiva/sclera: Conjunctivae normal.  Cardiovascular:     Rate and Rhythm: Normal rate and regular rhythm.     Heart sounds: Normal heart sounds. No murmur heard.   No friction rub. No gallop.  Pulmonary:     Effort: Pulmonary effort is normal. No respiratory distress.     Breath sounds: Normal breath sounds. No wheezing or rales.  Abdominal:     General: There is no distension.     Palpations: Abdomen is soft.     Tenderness: There is abdominal tenderness (RLQ). There is no guarding.  Musculoskeletal:     Cervical back: Normal range of motion.     Right lower leg: No edema.     Left lower leg: No edema.  Skin:    General: Skin is warm and dry.  Neurological:     Mental Status: He is alert and oriented to person, place, and time.    ED Results / Procedures / Treatments   Labs (all labs ordered are listed, but only abnormal results are displayed) Labs Reviewed  BASIC METABOLIC PANEL - Abnormal; Notable for the following components:      Result Value   Glucose, Bld 103 (*)    All other components within normal limits  CBC - Abnormal; Notable for the following components:   WBC 13.9 (*)    All other components within normal limits  RESP PANEL BY RT-PCR (FLU A&B, COVID) [id number]  PROTIME-INR  TROPONIN I (HIGH SENSITIVITY)  TROPONIN I (HIGH  SENSITIVITY)    EKG EKG Interpretation  Date/Time:  Tuesday December 28 2020 05:34:32 EDT Ventricular Rate:  62 PR Interval:  168 QRS Duration: 82 QT Interval:  406 QTC Calculation: 412 R Axis:   64 Text Interpretation: Normal sinus rhythm Moderate voltage criteria for LVH, may be normal variant ( Sokolow-Lyon , Cornell product ) Septal infarct , age undetermined Abnormal ECG No significant change since last tracing Reconfirmed by Gareth Morgan [phone]) on 12/28/2020 7:10:59 AM  Radiology DG Chest 2 View  Result Date: 12/28/2020 CLINICAL DATA:  Central chest pain with shortness of breath for 3 days EXAM: CHEST - 2 VIEW COMPARISON:  06/21/2020 FINDINGS: Large lung volumes with diaphragm flattening. There is history of COPD. 12 mm nodular density over the right upper lobe. An adjacent nodular density marked with an arrow has the shape of a button. No edema, effusion, or pneumothorax. Normal heart size and stable mediastinal contours. Abdominal aortic stent graft, minimally covered. IMPRESSION: 1. 12 mm nodule over the right upper lobe, recommend chest CT follow-up. 2. COPD without acute superimposed finding. Electronically Signed   By: Jorje Guild M.D.   On: 12/28/2020 05:57   CT Angio Chest PE W and/or Wo Contrast  Result Date: 12/28/2020 CLINICAL DATA:  Shortness of breath, chest pain EXAM: CT ANGIOGRAPHY CHEST WITH CONTRAST TECHNIQUE: Multidetector CT imaging of the chest was performed using the standard protocol during bolus administration of intravenous  contrast. Multiplanar CT image reconstructions and MIPs were obtained to evaluate the vascular anatomy. CONTRAST:  130mL OMNIPAQUE IOHEXOL 350 MG/ML SOLN COMPARISON:  08/17/2011 FINDINGS: Cardiovascular: No filling defects in the pulmonary arteries to suggest pulmonary emboli. Heart is normal size. Aorta is normal caliber. Aortic and coronary artery calcifications. Mediastinum/Nodes: No mediastinal, hilar, or axillary adenopathy.  Trachea and esophagus are unremarkable. Thyroid unremarkable. Lungs/Pleura: Moderate emphysema. Right upper lobe nodule measures 14 mm compared to 10 mm previously. The slow growth over the last 9 years suggests a benign process. Biapical scarring. No acute confluent opacities or effusions. Upper Abdomen: Imaging into the upper abdomen demonstrates no acute findings. Musculoskeletal: Chest wall soft tissues are unremarkable. No acute bony abnormality. Review of the MIP images confirms the above findings. IMPRESSION: No evidence of pulmonary embolus. 14 mm right upper lobe pulmonary nodule, slightly increased in size from 10 mm since 2013. This slow of growth over the last 9 years suggests a benign process. Coronary artery disease. Aortic Atherosclerosis (ICD10-I70.0) and Emphysema (ICD10-J43.9). Electronically Signed   By: Rolm Baptise M.D.   On: 12/28/2020 08:38   CT ABDOMEN PELVIS W CONTRAST  Result Date: 12/28/2020 CLINICAL DATA:  Right lower quadrant pain EXAM: CT ABDOMEN AND PELVIS WITH CONTRAST TECHNIQUE: Multidetector CT imaging of the abdomen and pelvis was performed using the standard protocol following bolus administration of intravenous contrast. CONTRAST:  138mL OMNIPAQUE IOHEXOL 350 MG/ML SOLN COMPARISON:  07/22/2020 FINDINGS: Lower chest: No acute abnormality. Hepatobiliary: No focal hepatic abnormality. Gallbladder unremarkable. Pancreas: No focal abnormality or ductal dilatation. Spleen: No focal abnormality.  Normal size. Adrenals/Urinary Tract: 2 cm cyst in the midpole of the left kidney, unchanged. 2.5 cm cyst off the lower pole of the left kidney, unchanged. Bilateral nephrolithiasis, stable. Punctate punctate calcifications noted along the posterior bladder wall near the UVJ bilaterally. It is unclear if these reflect layering stones or bladder wall calcifications. Adrenal glands unremarkable. Small low-density lesion noted in the upper pole of the right kidney, difficult to characterize but  does not appear definitively cystic. Stomach/Bowel: Normal appendix. Stomach, large and small bowel grossly unremarkable. Vascular/Lymphatic: Prior stent graft repair of AAA. Aneurysm sac size measures 5.3 x 4.1 cm compared to 5.3 x 6.2 cm previously. No adenopathy. Reproductive: Prostate enlargement with calcifications. Other: No free fluid or free air. Musculoskeletal: No acute bony abnormality. IMPRESSION: Bilateral nephrolithiasis.  No hydronephrosis. Punctate calcifications along the posterior bladder wall near both UVJ. It is unclear if these reflect small layering stones or bladder wall calcifications. No hydronephrosis. 11 mm low-density lesion in the upper pole the right kidney which is not definitively cystic. This is difficult to characterize due to its small size and proximity to the liver. Consider further evaluation with nonemergent MRI for better characterization. Prior stent graft repair of AAA with decreasing size of the aneurysm sac. Prostate enlargement. Electronically Signed   By: Rolm Baptise M.D.   On: 12/28/2020 08:46    Procedures Procedures   Medications Ordered in ED Medications  iohexol (OMNIPAQUE) 350 MG/ML injection 100 mL (100 mLs Intravenous Contrast Given 12/28/20 6389)    ED Course  I have reviewed the triage vital signs and the nursing notes.  Pertinent labs & imaging results that were available during my care of the patient were reviewed by me and considered in my medical decision making (see chart for details).    MDM Rules/Calculators/A&P  80 year old male with history of CAD, COPD, hyperlipidemia, interstitial lung disease, AAA with endovascular stent graft 06/2020, iliac stenosis, TRUBT 08/2020 for bladder cancer/low grade papillary urothelial carcinoma, presents with concern for chest pain. Differential diagnosis for chest pain includes pulmonary embolus, dissection, pneumothorax, pneumonia, ACS, myocarditis, pericarditis.  EKG was  done and evaluate by me and showed no acute ST changes and no signs of pericarditis. Chest x-ray was done and evaluated by me and radiology and showed no sign of pneumonia or pneumothorax, does show 12 mm lung nodule that will need CT follow-up.  Do not feel history or exam are consistent with aortic dissection.  Initial troponin negative, given duration of symptoms doubt MI, however history concerning for angina. Given significant dyspnea associated with symptoms and cancer history, will order CT PE study. Also reports right sided abdominal pain with significant tenderness on exam and will order CT abdomen pelvis and UA.   CT PE study shows no PE, benign appearance to pulmonary nodule which is only slightly increased in size over the last 9 years, and does show evidence of coronary artery disease. CT abdomen pelvis shows bilateral nephrolithiasis, punctate calcifications along the bladder wall, low-density lesion in the upper pole of the right kidney difficult to characterize, and discussed recommendation for nonemergent MRI for further characterization.  Patient received aspirin and nitroglycerin with EMS.  Reports his pain worsened again just walking to the bathroom, however now with rest that has eased off and feels only slight discomfort.  Discussed with cardiology PA Hinton Dyer, and will admit to hospitalist service with cardiology consulting given high risk and anginal symptoms. Holding on initiating heparin at this time.       Final Clinical Impression(s) / ED Diagnoses Final diagnoses:  Nonspecific chest pain  Angina at rest [facility])    Rx / DC Orders ED Discharge Orders     None        Gareth Morgan, MD 12/28/20 [phone]

## 2020-12-28 NOTE — ED Provider Notes (Signed)
Emergency Medicine Provider Triage Evaluation Note  Connor Cuevas , a 80 y.o. male  was evaluated in triage.  Pt complains of central chest pressure which as been intermittent, present for the past 3 days but worse at night. Acute worsening at 0100 today. Notes some aggravation with exertion. Took 324mg  ASA, Plavix, albuterol inhaler without relief. Also used his wife's home O2 given his associated SOB. Did have some improvement to his chest pressure with SL NTG given by EMS, but this has not improved his SOB symptoms. Marland Kitchen Hx AAA, CAD s/p DES to RCA, HLD, COPD.  Review of Systems  Positive: CP, SOB Negative: Diaphoresis, syncope, leg swelling  Physical Exam  There were no vitals taken for this visit. Gen:   Awake, no distress   Resp:  Appears dyspneic without tachypnea. Lungs grossly clear. MSK:   Moves extremities without difficulty  Other:  No BLE edema  Medical Decision Making  Medically screening exam initiated at 5:22 AM.  Appropriate orders placed.  Connor Cuevas was informed that the remainder of the evaluation will be completed by another provider, this initial triage assessment does not replace that evaluation, and the importance of remaining in the ED until their evaluation is complete.  Chest pain   Antonietta Breach, PA-C 12/28/20 [id number]    Merryl Hacker, MD 12/28/20 [phone]

## 2020-12-28 NOTE — ED Notes (Signed)
Pt voided in urinal, RN noted that urine was darker than this AM and pt c/o urinary frequency/burning. Pt states he has history of recent bladder/urethral procedure that contributed to the recent burning sensation. RN asked if Dr Earnest Conroy wanted a UA sent, awaiting response.

## 2020-12-28 NOTE — Progress Notes (Signed)
  Echocardiogram 2D Echocardiogram has been performed.  Connor Cuevas 12/28/2020, 3:50 PM

## 2020-12-28 NOTE — ED Notes (Signed)
Obtained troponin, covid swab. IV in place prior to rooming. RN called CT to update that pt ready for exam.

## 2020-12-28 NOTE — ED Triage Notes (Signed)
Patient arrived with EMS from home reports central chest pain with SOB for 3 days , he took ASA 324 mg and EMS gave 2 NTG sl prior to arrival with relief , history of CAD / Coronary stent/COPD, his cardiologist is Dr. [name] .

## 2020-12-29 DIAGNOSIS — J449 Chronic obstructive pulmonary disease, unspecified: Secondary | ICD-10-CM

## 2020-12-29 DIAGNOSIS — E782 Mixed hyperlipidemia: Secondary | ICD-10-CM

## 2020-12-29 DIAGNOSIS — I2583 Coronary atherosclerosis due to lipid rich plaque: Secondary | ICD-10-CM

## 2020-12-29 DIAGNOSIS — E785 Hyperlipidemia, unspecified: Secondary | ICD-10-CM

## 2020-12-29 DIAGNOSIS — I2510 Atherosclerotic heart disease of native coronary artery without angina pectoris: Secondary | ICD-10-CM

## 2020-12-29 DIAGNOSIS — D494 Neoplasm of unspecified behavior of bladder: Secondary | ICD-10-CM

## 2020-12-29 DIAGNOSIS — I209 Angina pectoris, unspecified: Secondary | ICD-10-CM | POA: Diagnosis not present

## 2020-12-29 DIAGNOSIS — F411 Generalized anxiety disorder: Secondary | ICD-10-CM

## 2020-12-29 DIAGNOSIS — R0789 Other chest pain: Secondary | ICD-10-CM | POA: Diagnosis not present

## 2020-12-29 DIAGNOSIS — R0600 Dyspnea, unspecified: Secondary | ICD-10-CM

## 2020-12-29 DIAGNOSIS — I359 Nonrheumatic aortic valve disorder, unspecified: Secondary | ICD-10-CM

## 2020-12-29 DIAGNOSIS — I714 Abdominal aortic aneurysm, without rupture: Secondary | ICD-10-CM | POA: Diagnosis not present

## 2020-12-29 LAB — LIPID PANEL
Cholesterol: 215 mg/dL — ABNORMAL HIGH (ref 0–200)
HDL: 43 mg/dL (ref >40)
LDL Cholesterol: 155 mg/dL — ABNORMAL HIGH (ref 0–99)
Total CHOL/HDL Ratio: 5 RATIO
Triglycerides: 84 mg/dL (ref <150)
VLDL: 17 mg/dL (ref 0–40)

## 2020-12-29 LAB — CBC
HCT: 44.8 % (ref 39.0–52.0)
Hemoglobin: 14.5 g/dL (ref 13.0–17.0)
MCH: 29.2 pg (ref 26.0–34.0)
MCHC: 32.4 g/dL (ref 30.0–36.0)
MCV: 90.1 fL (ref 80.0–100.0)
Platelets: 259 10*3/uL (ref 150–400)
RBC: 4.97 MIL/uL (ref 4.22–5.81)
RDW: 13.5 % (ref 11.5–15.5)
WBC: 13.2 10*3/uL — ABNORMAL HIGH (ref 4.0–10.5)
nRBC: 0 % (ref 0.0–0.2)

## 2020-12-29 MED ORDER — AMLODIPINE BESYLATE 2.5 MG PO TABS: 2.5000 mg | ORAL_TABLET | Freq: Every day | ORAL | 2 refills | Status: DC

## 2020-12-29 MED ORDER — METOPROLOL SUCCINATE ER 25 MG PO TB24: 12.5000 mg | ORAL_TABLET | Freq: Every day | ORAL | 2 refills | Status: DC

## 2020-12-29 NOTE — Progress Notes (Signed)
Progress Note  Patient Name: Connor Cuevas Date of Encounter: 12/29/2020  Primary Cardiologist: Dr. [name]   Subjective   Feels much better today, no shortness of breath.  No chest pain  Inpatient Medications    Scheduled Meds:  amLODipine  2.5 mg Oral Daily   aspirin EC  81 mg Oral Q0600   atorvastatin  80 mg Oral Daily   brimonidine  1 drop Both Eyes BID   cholecalciferol  1,000 Units Oral Daily   clopidogrel  75 mg Oral Daily   enoxaparin (LOVENOX) injection  40 mg Subcutaneous Q24H   latanoprost  1 drop Both Eyes QHS   metoprolol succinate  12.5 mg Oral Daily   multivitamin with minerals  1 tablet Oral Daily   nitroGLYCERIN  0.1 mg Transdermal Q24H   omega-3 acid ethyl esters  1 g Oral Daily   traZODone  100 mg Oral QHS   umeclidinium bromide  1 puff Inhalation Daily   Continuous Infusions:  PRN Meds: acetaminophen, albuterol, HYDROcodone-acetaminophen, morphine injection, nitroGLYCERIN, ondansetron (ZOFRAN) IV   Vital Signs    Vitals:   12/28/20 2020 12/29/20 0033 12/29/20 0455 12/29/20 1202  BP: 117/64 109/62 118/68 133/77  Pulse: 73 90 91 73  Resp: 20 18 18 16   Temp: 98.7 F (37.1 C) 99.1 F (37.3 C) 99.2 F (37.3 C) 98 F (36.7 C)  TempSrc: Oral Oral Oral Oral  SpO2: 96% 96% 95% 99%  Weight:      Height:        Intake/Output Summary (Last 24 hours) at 12/29/2020 1447 Last data filed at 12/28/2020 2100 Gross per 24 hour  Intake --  Output 200 ml  Net -200 ml    I/O since admission: -200  Filed Weights   12/28/20 0524  Weight: 65 kg    Telemetry    Sinus rhythm- Personally Reviewed  ECG    ECG (independently read by me): Normal sinus rhythm at 62, LVH and  Physical Exam    BP 133/77 (BP Location: Right Arm)   Pulse 73   Temp 98 F (36.7 C) (Oral)   Resp 16   Ht 5\' 9"  (1.753 m)   Wt 65 kg   SpO2 99%   BMI 21.16 kg/m  General: Alert, oriented, no distress.  Skin: normal turgor, no rashes, warm and dry HEENT:  Normocephalic, atraumatic. Pupils equal round and reactive to light; sclera anicteric; extraocular muscles intact;  Nose without nasal septal hypertrophy Mouth/Parynx benign; Neck: No JVD, no carotid bruits; normal carotid upstroke Lungs: clear to ausculatation and percussion; no wheezing or rales; slightly decreased breath sounds at bases without wheezing Chest wall: without tenderness to palpitation;  Heart: PMI not displaced, RRR, s1 s2 normal, 1/6 systolic murmur, no diastolic murmur, no rubs, gallops, thrills, or heaves Abdomen: soft, nontender; no hepatosplenomehaly, BS+; abdominal aorta nontender and not dilated by palpation. Back: no CVA tenderness; kyphotic Pulses 2+ Musculoskeletal: full range of motion, normal strength, no joint deformities Extremities: no clubbing cyanosis or edema, Homan's sign negative  Neurologic: grossly nonfocal; Cranial nerves grossly wnl Psychologic: Normal mood and affect   Labs    Chemistry Recent Labs  Lab 12/28/20 0540 12/28/20 1400  NA 137  --   K 4.2  --   CL 103  --   CO2 27  --   GLUCOSE 103*  --   BUN 20  --   CREATININE 1.13 1.04  CALCIUM 9.3  --   GFRNONAA >60 >60  ANIONGAP 7  --      Hematology Recent Labs  Lab 12/28/20 0540 12/28/20 1400 12/29/20 0030  WBC 13.9* 16.8* 13.2*  RBC 5.10 5.17 4.97  HGB 15.0 15.2 14.5  HCT 46.4 46.9 44.8  MCV 91.0 90.7 90.1  MCH 29.4 29.4 29.2  MCHC 32.3 32.4 32.4  RDW 13.4 13.5 13.5  PLT 322 275 259    Cardiac EnzymesNo results for input(s): TROPONINI in the last 168 hours. No results for input(s): TROPIPOC in the last 168 hours.   BNPNo results for input(s): BNP, PROBNP in the last 168 hours.   DDimer No results for input(s): DDIMER in the last 168 hours.   Lipid Panel     Component Value Date/Time   CHOL 215 (H) 12/29/2020 0030   CHOL 194 08/02/2020 0837   TRIG 84 12/29/2020 0030   HDL 43 12/29/2020 0030   HDL 52 08/02/2020 0837   CHOLHDL 5.0 12/29/2020 0030   VLDL 17  12/29/2020 0030   LDLCALC 155 (H) 12/29/2020 0030   LDLCALC 114 (H) 08/02/2020 0837     Radiology    DG Chest 2 View  Result Date: 12/28/2020 CLINICAL DATA:  Central chest pain with shortness of breath for 3 days EXAM: CHEST - 2 VIEW COMPARISON:  06/21/2020 FINDINGS: Large lung volumes with diaphragm flattening. There is history of COPD. 12 mm nodular density over the right upper lobe. An adjacent nodular density marked with an arrow has the shape of a button. No edema, effusion, or pneumothorax. Normal heart size and stable mediastinal contours. Abdominal aortic stent graft, minimally covered. IMPRESSION: 1. 12 mm nodule over the right upper lobe, recommend chest CT follow-up. 2. COPD without acute superimposed finding. Electronically Signed   By: Jorje Guild M.D.   On: 12/28/2020 05:57   CT Angio Chest PE W and/or Wo Contrast  Result Date: 12/28/2020 CLINICAL DATA:  Shortness of breath, chest pain EXAM: CT ANGIOGRAPHY CHEST WITH CONTRAST TECHNIQUE: Multidetector CT imaging of the chest was performed using the standard protocol during bolus administration of intravenous contrast. Multiplanar CT image reconstructions and MIPs were obtained to evaluate the vascular anatomy. CONTRAST:  124mL OMNIPAQUE IOHEXOL 350 MG/ML SOLN COMPARISON:  08/17/2011 FINDINGS: Cardiovascular: No filling defects in the pulmonary arteries to suggest pulmonary emboli. Heart is normal size. Aorta is normal caliber. Aortic and coronary artery calcifications. Mediastinum/Nodes: No mediastinal, hilar, or axillary adenopathy. Trachea and esophagus are unremarkable. Thyroid unremarkable. Lungs/Pleura: Moderate emphysema. Right upper lobe nodule measures 14 mm compared to 10 mm previously. The slow growth over the last 9 years suggests a benign process. Biapical scarring. No acute confluent opacities or effusions. Upper Abdomen: Imaging into the upper abdomen demonstrates no acute findings. Musculoskeletal: Chest wall soft tissues  are unremarkable. No acute bony abnormality. Review of the MIP images confirms the above findings. IMPRESSION: No evidence of pulmonary embolus. 14 mm right upper lobe pulmonary nodule, slightly increased in size from 10 mm since 2013. This slow of growth over the last 9 years suggests a benign process. Coronary artery disease. Aortic Atherosclerosis (ICD10-I70.0) and Emphysema (ICD10-J43.9). Electronically Signed   By: Rolm Baptise M.D.   On: 12/28/2020 08:38   CT ABDOMEN PELVIS W CONTRAST  Result Date: 12/28/2020 CLINICAL DATA:  Right lower quadrant pain EXAM: CT ABDOMEN AND PELVIS WITH CONTRAST TECHNIQUE: Multidetector CT imaging of the abdomen and pelvis was performed using the standard protocol following bolus administration of intravenous contrast. CONTRAST:  124mL OMNIPAQUE IOHEXOL 350 MG/ML SOLN COMPARISON:  07/22/2020 FINDINGS: Lower chest: No acute abnormality. Hepatobiliary: No focal hepatic abnormality. Gallbladder unremarkable. Pancreas: No focal abnormality or ductal dilatation. Spleen: No focal abnormality.  Normal size. Adrenals/Urinary Tract: 2 cm cyst in the midpole of the left kidney, unchanged. 2.5 cm cyst off the lower pole of the left kidney, unchanged. Bilateral nephrolithiasis, stable. Punctate punctate calcifications noted along the posterior bladder wall near the UVJ bilaterally. It is unclear if these reflect layering stones or bladder wall calcifications. Adrenal glands unremarkable. Small low-density lesion noted in the upper pole of the right kidney, difficult to characterize but does not appear definitively cystic. Stomach/Bowel: Normal appendix. Stomach, large and small bowel grossly unremarkable. Vascular/Lymphatic: Prior stent graft repair of AAA. Aneurysm sac size measures 5.3 x 4.1 cm compared to 5.3 x 6.2 cm previously. No adenopathy. Reproductive: Prostate enlargement with calcifications. Other: No free fluid or free air. Musculoskeletal: No acute bony abnormality.  IMPRESSION: Bilateral nephrolithiasis.  No hydronephrosis. Punctate calcifications along the posterior bladder wall near both UVJ. It is unclear if these reflect small layering stones or bladder wall calcifications. No hydronephrosis. 11 mm low-density lesion in the upper pole the right kidney which is not definitively cystic. This is difficult to characterize due to its small size and proximity to the liver. Consider further evaluation with nonemergent MRI for better characterization. Prior stent graft repair of AAA with decreasing size of the aneurysm sac. Prostate enlargement. Electronically Signed   By: Rolm Baptise M.D.   On: 12/28/2020 08:46   ECHOCARDIOGRAM COMPLETE  Result Date: 12/28/2020    ECHOCARDIOGRAM REPORT   Patient Name:   Connor Cuevas Date of Exam: 12/28/2020 Medical Rec #:  [id number]      Height:       69.0 in Accession #:    [id number]     Weight:       143.3 lb Date of Birth:  [birth year]      BSA:          1.793 m Patient Age:    80 years       BP:           166/80 mmHg Patient Gender: M              HR:           95 bpm. Exam Location:  Inpatient Procedure: 2D Echo, Cardiac Doppler and Color Doppler Indications:    Dyspnea R06.00  History:        Patient has prior history of Echocardiogram examinations, most                 recent 01/09/2014. CAD, COPD, AAA; Risk Factors:Dyslipidemia.  Sonographer:    Bernadene Person RDCS Referring Phys: [id number] [place]  1. Left ventricular ejection fraction, by estimation, is 60 to 65%. The left ventricle has normal function. The left ventricle has no regional wall motion abnormalities. Left ventricular diastolic parameters are consistent with Grade I diastolic dysfunction (impaired relaxation).  2. Right ventricular systolic function is normal. The right ventricular size is normal.  3. The mitral valve is normal in structure. No evidence of mitral valve regurgitation. No evidence of mitral stenosis.  4. The aortic valve is normal in  structure. Aortic valve regurgitation is not visualized. No aortic stenosis is present.  5. The inferior vena cava is normal in size with greater than 50% respiratory variability, suggesting right atrial pressure of 3 mmHg. FINDINGS  Left Ventricle: Left ventricular ejection fraction, by estimation, is  60 to 65%. The left ventricle has normal function. The left ventricle has no regional wall motion abnormalities. The left ventricular internal cavity size was normal in size. There is  no left ventricular hypertrophy. Left ventricular diastolic parameters are consistent with Grade I diastolic dysfunction (impaired relaxation). Right Ventricle: The right ventricular size is normal. No increase in right ventricular wall thickness. Right ventricular systolic function is normal. Left Atrium: Left atrial size was normal in size. Right Atrium: Right atrial size was normal in size. Pericardium: There is no evidence of pericardial effusion. Mitral Valve: The mitral valve is normal in structure. No evidence of mitral valve regurgitation. No evidence of mitral valve stenosis. Tricuspid Valve: The tricuspid valve is normal in structure. Tricuspid valve regurgitation is not demonstrated. No evidence of tricuspid stenosis. Aortic Valve: The aortic valve is normal in structure. Aortic valve regurgitation is not visualized. No aortic stenosis is present. Pulmonic Valve: The pulmonic valve was normal in structure. Pulmonic valve regurgitation is trivial. No evidence of pulmonic stenosis. Aorta: The aortic root is normal in size and structure. Venous: The inferior vena cava is normal in size with greater than 50% respiratory variability, suggesting right atrial pressure of 3 mmHg. IAS/Shunts: No atrial level shunt detected by color flow Doppler.  LEFT VENTRICLE PLAX 2D LVIDd:         4.30 cm     Diastology LVIDs:         2.90 cm     LV e' medial:  9.73 cm/s LV PW:         1.10 cm     LV e' lateral: 11.80 cm/s LV IVS:        0.90 cm  LVOT diam:     2.10 cm LV SV:         52 LV SV Index:   29 LVOT Area:     3.46 cm  LV Volumes (MOD) LV vol d, MOD A2C: 87.5 ml LV vol d, MOD A4C: 95.5 ml LV vol s, MOD A2C: 44.3 ml LV vol s, MOD A4C: 40.9 ml LV SV MOD A2C:     43.2 ml LV SV MOD A4C:     95.5 ml LV SV MOD BP:      47.5 ml RIGHT VENTRICLE RV S prime:     13.50 cm/s TAPSE (M-mode): 1.6 cm LEFT ATRIUM             Index       RIGHT ATRIUM           Index LA diam:        3.00 cm 1.67 cm/m  RA Area:     11.00 cm LA Vol (A2C):   24.0 ml 13.38 ml/m RA Volume:   24.00 ml  13.38 ml/m LA Vol (A4C):   23.7 ml 13.22 ml/m LA Biplane Vol: 25.0 ml 13.94 ml/m  AORTIC VALVE LVOT Vmax:   93.60 cm/s LVOT Vmean:  60.800 cm/s LVOT VTI:    0.149 m  AORTA Ao Root diam: 3.70 cm Ao Asc diam:  3.30 cm  SHUNTS Systemic VTI:  0.15 m Systemic Diam: 2.10 cm Candee Furbish MD Electronically signed by Candee Furbish MD Signature Date/Time: 12/28/2020/3:56:42 PM    Final     Cardiac Studies   ECHO: 12/28/20 IMPRESSIONS    1. Left ventricular ejection fraction, by estimation, is 60 to 65%. The  left ventricle has normal function. The left ventricle has no regional  wall motion abnormalities. Left ventricular diastolic  parameters are  consistent with Grade I diastolic  dysfunction (impaired relaxation).   2. Right ventricular systolic function is normal. The right ventricular  size is normal.   3. The mitral valve is normal in structure. No evidence of mitral valve  regurgitation. No evidence of mitral stenosis.   4. The aortic valve is normal in structure. Aortic valve regurgitation is  not visualized. No aortic stenosis is present.   5. The inferior vena cava is normal in size with greater than 50%  respiratory variability, suggesting right atrial pressure of 3 mmHg.   Patient Profile     Connor Cuevas is a 80 y.o. male with a hx of CAD s/p DES to RCA (01/2014), HLD, AAA, and COPD who was admitted on December 28, 2020 with increasing shortness of breath and  PND  Assessment & Plan    Dyspnea: Improved.  Echo Doppler study shows EF 60 to 65% with grade 1 diastolic dysfunction.  No significant valvular pathology.  Not hypoxic; no acute process on chest x-ray or CT of the chest.  BNP not resulted.  CAD: He is status post DES stent to mid RCA in October 2015.  His last nuclear stress test in March 2021 was nonischemic and low risk.  Patient denies any chest pain.  On further questioning he denies exertional chest tightness.  Yesterday symptomatology were more related to dyspnea and suggestive of PND.  At this point, I do not feel the patient needs a repeat ischemic evaluation unless he has recurrent symptomatology and this can be done as an outpatient.  He continues to be on DAPT with aspirin/Plavix.  Continue amlodipine at 2.5 mg increased to 5 mg if blood pressure increases.  Yesterday I discontinued atenolol and substituted this with metoprolol succinate  Hyperlipidemia: LDL cholesterol elevated at 155.  If patient is taking atorvastatin 80 mg, would initiate Zetia 10 mg  Signed, Troy Sine, MD, [facility] 12/29/2020, 2:47 PM

## 2020-12-29 NOTE — Discharge Summary (Signed)
Physician Discharge Summary  [name]:[id number] DOB: November 30, 1940 DOA: 12/28/2020  PCP: [name], MD  Admit date: 12/28/2020 Discharge date: 12/29/2020  Admitted From: Home  Discharge disposition: Home  Recommendations for Outpatient Follow-Up:   Follow up with your primary care provider in one week.  Check CBC, BMP, magnesium in the next visit Follow-up with Dr. [name], cardiology as outpatient.  Discharge Diagnosis:   Principal Problem:   Chest pain Active Problems:   Anxiety state   Unspecified glaucoma   Aortic valve disorder   Hyperlipidemia   CAD (coronary artery disease)   COPD (chronic obstructive pulmonary disease) (HCC)   AAA (abdominal aortic aneurysm) (HCC)   Stenosis of iliac artery (HCC)   Tobacco abuse   Bladder tumor   Discharge Condition: Improved.  Diet recommendation: Low sodium, heart healthy.    Wound care: None.  Code status: Full.   History of Present Illness:   HIAWATHA Cuevas is a 80 y.o. male with past medical history of coronary artery disease, COPD, hyperlipidemia, interstitial lung disease, COPD, AAA with endovascular graft on 06/08/2020, history of bladder cancer status post TURBT presented to the hospital for concerns of chest pain.  He was having some upper respiratory symptoms and was on Sudafed as outpatient.  Patient had seen his primary care physician and had a COVID test done which was negative. Of note, patient had a  stress test was last year that was normal.  Patient was then placed in observation.  Hospital Course:   Following conditions were addressed during hospitalization as listed below,  Atypical chest pain: Acute coronary syndrome ruled out.  Troponins negative.  Patient has a history of coronary artery disease status post PTCA to RCA, on DAPT.  Patient was seen by cardiology who recommended metoprolol and amlodipine on discharge.  Atenolol has been discontinued.  Patient will be continued on DAPT on discharge  including beta-blockers and statin.  Chest x-ray, CTA chest was negative for pneumonia/PE or other acute findings.     Essential hypertension: Patient will continue metoprolol and amlodipine on discharge   Leukocytosis:  Could be reactive.  Recent sinusitis.  Leukocytosis trending down.   Pulmonary nodule: CTA chest reported 14 mm right pulmonary nodule (increased from 10 mm over 9 years-indicative of benign process per radiologist).  Will need to follow-up with primary care provider as outpatient.   Abdominal discomfort, palpitations:  Patient had a CT scan of the abdomen which showed nonspecific findings including bilateral nephrolithiasis.  Patient does have a previous stent graft repair with decreasing size of aneurysm sac    COPD: Stable at this time.  History of bladder tumor: S/p resection.  CT reports prostrate enlargement but no bladder abnormalities.  Follow-up as outpatient.   Anxiety: Continue trazodone from home.  Disposition.  At this time, patient is stable for disposition home with outpatient PCP follow-up.  Medical Consultants:   Cardiology  Procedures:    None Subjective:   Today, patient was seen and examined at bedside.  Patient denies any chest pain, shortness of breath, fever, chills or rigor.  Feels much better than when he came in  Discharge Exam:   Vitals:   12/29/20 0455 12/29/20 1202  BP: 118/68 133/77  Pulse: 91 73  Resp: 18 16  Temp: 99.2 F (37.3 C) 98 F (36.7 C)  SpO2: 95% 99%   Vitals:   12/28/20 2020 12/29/20 0033 12/29/20 0455 12/29/20 1202  BP: 117/64 109/62 118/68 133/77  Pulse: 73 90  91 73  Resp: 20 18 18 16   Temp: 98.7 F (37.1 C) 99.1 F (37.3 C) 99.2 F (37.3 C) 98 F (36.7 C)  TempSrc: Oral Oral Oral Oral  SpO2: 96% 96% 95% 99%  Weight:      Height:       General: Alert awake, not in obvious distress, thinly built HENT: pupils equally reacting to light,  No scleral pallor or icterus noted. Oral mucosa is moist.   Chest:  Clear breath sounds.  Diminished breath sounds bilaterally. No crackles or wheezes.  CVS: S1 &S2 heard. No murmur.  Regular rate and rhythm. Abdomen: Nontender, bowel sounds are heard.   Extremities: No cyanosis, clubbing or edema.  Peripheral pulses are palpable. Psych: Alert, awake and oriented, normal mood CNS:  No cranial nerve deficits.  Power equal in all extremities.   Skin: Warm and dry.  No rashes noted.  The results of significant diagnostics from this hospitalization (including imaging, microbiology, ancillary and laboratory) are listed below for reference.     Diagnostic Studies:   DG Chest 2 View  Result Date: 12/28/2020 CLINICAL DATA:  Central chest pain with shortness of breath for 3 days EXAM: CHEST - 2 VIEW COMPARISON:  06/21/2020 FINDINGS: Large lung volumes with diaphragm flattening. There is history of COPD. 12 mm nodular density over the right upper lobe. An adjacent nodular density marked with an arrow has the shape of a button. No edema, effusion, or pneumothorax. Normal heart size and stable mediastinal contours. Abdominal aortic stent graft, minimally covered. IMPRESSION: 1. 12 mm nodule over the right upper lobe, recommend chest CT follow-up. 2. COPD without acute superimposed finding. Electronically Signed   By: Jorje Guild M.D.   On: 12/28/2020 05:57   CT Angio Chest PE W and/or Wo Contrast  Result Date: 12/28/2020 CLINICAL DATA:  Shortness of breath, chest pain EXAM: CT ANGIOGRAPHY CHEST WITH CONTRAST TECHNIQUE: Multidetector CT imaging of the chest was performed using the standard protocol during bolus administration of intravenous contrast. Multiplanar CT image reconstructions and MIPs were obtained to evaluate the vascular anatomy. CONTRAST:  17mL OMNIPAQUE IOHEXOL 350 MG/ML SOLN COMPARISON:  08/17/2011 FINDINGS: Cardiovascular: No filling defects in the pulmonary arteries to suggest pulmonary emboli. Heart is normal size. Aorta is normal caliber.  Aortic and coronary artery calcifications. Mediastinum/Nodes: No mediastinal, hilar, or axillary adenopathy. Trachea and esophagus are unremarkable. Thyroid unremarkable. Lungs/Pleura: Moderate emphysema. Right upper lobe nodule measures 14 mm compared to 10 mm previously. The slow growth over the last 9 years suggests a benign process. Biapical scarring. No acute confluent opacities or effusions. Upper Abdomen: Imaging into the upper abdomen demonstrates no acute findings. Musculoskeletal: Chest wall soft tissues are unremarkable. No acute bony abnormality. Review of the MIP images confirms the above findings. IMPRESSION: No evidence of pulmonary embolus. 14 mm right upper lobe pulmonary nodule, slightly increased in size from 10 mm since 2013. This slow of growth over the last 9 years suggests a benign process. Coronary artery disease. Aortic Atherosclerosis (ICD10-I70.0) and Emphysema (ICD10-J43.9). Electronically Signed   By: Rolm Baptise M.D.   On: 12/28/2020 08:38   CT ABDOMEN PELVIS W CONTRAST  Result Date: 12/28/2020 CLINICAL DATA:  Right lower quadrant pain EXAM: CT ABDOMEN AND PELVIS WITH CONTRAST TECHNIQUE: Multidetector CT imaging of the abdomen and pelvis was performed using the standard protocol following bolus administration of intravenous contrast. CONTRAST:  165mL OMNIPAQUE IOHEXOL 350 MG/ML SOLN COMPARISON:  07/22/2020 FINDINGS: Lower chest: No acute abnormality. Hepatobiliary: No focal  hepatic abnormality. Gallbladder unremarkable. Pancreas: No focal abnormality or ductal dilatation. Spleen: No focal abnormality.  Normal size. Adrenals/Urinary Tract: 2 cm cyst in the midpole of the left kidney, unchanged. 2.5 cm cyst off the lower pole of the left kidney, unchanged. Bilateral nephrolithiasis, stable. Punctate punctate calcifications noted along the posterior bladder wall near the UVJ bilaterally. It is unclear if these reflect layering stones or bladder wall calcifications. Adrenal glands  unremarkable. Small low-density lesion noted in the upper pole of the right kidney, difficult to characterize but does not appear definitively cystic. Stomach/Bowel: Normal appendix. Stomach, large and small bowel grossly unremarkable. Vascular/Lymphatic: Prior stent graft repair of AAA. Aneurysm sac size measures 5.3 x 4.1 cm compared to 5.3 x 6.2 cm previously. No adenopathy. Reproductive: Prostate enlargement with calcifications. Other: No free fluid or free air. Musculoskeletal: No acute bony abnormality. IMPRESSION: Bilateral nephrolithiasis.  No hydronephrosis. Punctate calcifications along the posterior bladder wall near both UVJ. It is unclear if these reflect small layering stones or bladder wall calcifications. No hydronephrosis. 11 mm low-density lesion in the upper pole the right kidney which is not definitively cystic. This is difficult to characterize due to its small size and proximity to the liver. Consider further evaluation with nonemergent MRI for better characterization. Prior stent graft repair of AAA with decreasing size of the aneurysm sac. Prostate enlargement. Electronically Signed   By: Rolm Baptise M.D.   On: 12/28/2020 08:46   ECHOCARDIOGRAM COMPLETE  Result Date: 12/28/2020    ECHOCARDIOGRAM REPORT   Patient Name:   Connor Cuevas Date of Exam: 12/28/2020 Medical Rec #:  [id number]      Height:       69.0 in Accession #:    [id number]     Weight:       143.3 lb Date of Birth:  1940/05/23      BSA:          1.793 m Patient Age:    80 years       BP:           166/80 mmHg Patient Gender: M              HR:           95 bpm. Exam Location:  Inpatient Procedure: 2D Echo, Cardiac Doppler and Color Doppler Indications:    Dyspnea R06.00  History:        Patient has prior history of Echocardiogram examinations, most                 recent 01/09/2014. CAD, COPD, AAA; Risk Factors:Dyslipidemia.  Sonographer:    Bernadene Person RDCS Referring Phys: [id number] [place]  1. Left  ventricular ejection fraction, by estimation, is 60 to 65%. The left ventricle has normal function. The left ventricle has no regional wall motion abnormalities. Left ventricular diastolic parameters are consistent with Grade I diastolic dysfunction (impaired relaxation).  2. Right ventricular systolic function is normal. The right ventricular size is normal.  3. The mitral valve is normal in structure. No evidence of mitral valve regurgitation. No evidence of mitral stenosis.  4. The aortic valve is normal in structure. Aortic valve regurgitation is not visualized. No aortic stenosis is present.  5. The inferior vena cava is normal in size with greater than 50% respiratory variability, suggesting right atrial pressure of 3 mmHg. FINDINGS  Left Ventricle: Left ventricular ejection fraction, by estimation, is 60 to 65%. The left ventricle has normal function. The  left ventricle has no regional wall motion abnormalities. The left ventricular internal cavity size was normal in size. There is  no left ventricular hypertrophy. Left ventricular diastolic parameters are consistent with Grade I diastolic dysfunction (impaired relaxation). Right Ventricle: The right ventricular size is normal. No increase in right ventricular wall thickness. Right ventricular systolic function is normal. Left Atrium: Left atrial size was normal in size. Right Atrium: Right atrial size was normal in size. Pericardium: There is no evidence of pericardial effusion. Mitral Valve: The mitral valve is normal in structure. No evidence of mitral valve regurgitation. No evidence of mitral valve stenosis. Tricuspid Valve: The tricuspid valve is normal in structure. Tricuspid valve regurgitation is not demonstrated. No evidence of tricuspid stenosis. Aortic Valve: The aortic valve is normal in structure. Aortic valve regurgitation is not visualized. No aortic stenosis is present. Pulmonic Valve: The pulmonic valve was normal in structure. Pulmonic  valve regurgitation is trivial. No evidence of pulmonic stenosis. Aorta: The aortic root is normal in size and structure. Venous: The inferior vena cava is normal in size with greater than 50% respiratory variability, suggesting right atrial pressure of 3 mmHg. IAS/Shunts: No atrial level shunt detected by color flow Doppler.  LEFT VENTRICLE PLAX 2D LVIDd:         4.30 cm     Diastology LVIDs:         2.90 cm     LV e' medial:  9.73 cm/s LV PW:         1.10 cm     LV e' lateral: 11.80 cm/s LV IVS:        0.90 cm LVOT diam:     2.10 cm LV SV:         52 LV SV Index:   29 LVOT Area:     3.46 cm  LV Volumes (MOD) LV vol d, MOD A2C: 87.5 ml LV vol d, MOD A4C: 95.5 ml LV vol s, MOD A2C: 44.3 ml LV vol s, MOD A4C: 40.9 ml LV SV MOD A2C:     43.2 ml LV SV MOD A4C:     95.5 ml LV SV MOD BP:      47.5 ml RIGHT VENTRICLE RV S prime:     13.50 cm/s TAPSE (M-mode): 1.6 cm LEFT ATRIUM             Index       RIGHT ATRIUM           Index LA diam:        3.00 cm 1.67 cm/m  RA Area:     11.00 cm LA Vol (A2C):   24.0 ml 13.38 ml/m RA Volume:   24.00 ml  13.38 ml/m LA Vol (A4C):   23.7 ml 13.22 ml/m LA Biplane Vol: 25.0 ml 13.94 ml/m  AORTIC VALVE LVOT Vmax:   93.60 cm/s LVOT Vmean:  60.800 cm/s LVOT VTI:    0.149 m  AORTA Ao Root diam: 3.70 cm Ao Asc diam:  3.30 cm  SHUNTS Systemic VTI:  0.15 m Systemic Diam: 2.10 cm Candee Furbish MD Electronically signed by Candee Furbish MD Signature Date/Time: 12/28/2020/3:56:42 PM    Final     Labs:   Basic Metabolic Panel: Recent Labs  Lab 12/28/20 0540 12/28/20 1400  NA 137  --   K 4.2  --   CL 103  --   CO2 27  --   GLUCOSE 103*  --   BUN 20  --  CREATININE 1.13 1.04  CALCIUM 9.3  --    GFR Estimated Creatinine Clearance: 53.8 mL/min (by C-G formula based on SCr of 1.04 mg/dL). Liver Function Tests: No results for input(s): AST, ALT, ALKPHOS, BILITOT, PROT, ALBUMIN in the last 168 hours. No results for input(s): LIPASE, AMYLASE in the last 168 hours. No results for  input(s): AMMONIA in the last 168 hours. Coagulation profile Recent Labs  Lab 12/28/20 0540  INR 1.0    CBC: Recent Labs  Lab 12/28/20 0540 12/28/20 1400 12/29/20 0030  WBC 13.9* 16.8* 13.2*  HGB 15.0 15.2 14.5  HCT 46.4 46.9 44.8  MCV 91.0 90.7 90.1  PLT 322 275 259   Cardiac Enzymes: No results for input(s): CKTOTAL, CKMB, CKMBINDEX, TROPONINI in the last 168 hours. BNP: Invalid input(s): POCBNP CBG: No results for input(s): GLUCAP in the last 168 hours. D-Dimer No results for input(s): DDIMER in the last 72 hours. Hgb A1c No results for input(s): HGBA1C in the last 72 hours. Lipid Profile Recent Labs    12/29/20 0030  CHOL 215*  HDL 43  LDLCALC 155*  TRIG 84  CHOLHDL 5.0   Thyroid function studies No results for input(s): TSH, T4TOTAL, T3FREE, THYROIDAB in the last 72 hours.  Invalid input(s): FREET3 Anemia work up No results for input(s): VITAMINB12, FOLATE, FERRITIN, TIBC, IRON, RETICCTPCT in the last 72 hours. Microbiology Recent Results (from the past 240 hour(s))  Resp Panel by RT-PCR (Flu A&B, Covid) Nasopharyngeal Swab     Status: None   Collection Time: 12/28/20  7:32 AM   Specimen: Nasopharyngeal Swab; Nasopharyngeal(NP) swabs in vial transport medium  Result Value Ref Range Status   SARS Coronavirus 2 by RT PCR NEGATIVE NEGATIVE Final    Comment: (NOTE) SARS-CoV-2 target nucleic acids are NOT DETECTED.  The SARS-CoV-2 RNA is generally detectable in upper respiratory specimens during the acute phase of infection. The lowest concentration of SARS-CoV-2 viral copies this assay can detect is 138 copies/mL. A negative result does not preclude SARS-Cov-2 infection and should not be used as the sole basis for treatment or other patient management decisions. A negative result may occur with  improper specimen collection/handling, submission of specimen other than nasopharyngeal swab, presence of viral mutation(s) within the areas targeted by this  assay, and inadequate number of viral copies(<138 copies/mL). A negative result must be combined with clinical observations, patient history, and epidemiological information. The expected result is Negative.  Fact Sheet for Patients:  EntrepreneurPulse.com.au  Fact Sheet for Healthcare Providers:  IncredibleEmployment.be  This test is no t yet approved or cleared by the [place] FDA and  has been authorized for detection and/or diagnosis of SARS-CoV-2 by FDA under an Emergency Use Authorization (EUA). This EUA will remain  in effect (meaning this test can be used) for the duration of the COVID-19 declaration under Section 564(b)(1) of the Act, 21 U.S.C.section 360bbb-3(b)(1), unless the authorization is terminated  or revoked sooner.       Influenza A by PCR NEGATIVE NEGATIVE Final   Influenza B by PCR NEGATIVE NEGATIVE Final    Comment: (NOTE) The Xpert Xpress SARS-CoV-2/FLU/RSV plus assay is intended as an aid in the diagnosis of influenza from Nasopharyngeal swab specimens and should not be used as a sole basis for treatment. Nasal washings and aspirates are unacceptable for Xpert Xpress SARS-CoV-2/FLU/RSV testing.  Fact Sheet for Patients: EntrepreneurPulse.com.au  Fact Sheet for Healthcare Providers: IncredibleEmployment.be  This test is not yet approved or cleared by the [place] FDA  and has been authorized for detection and/or diagnosis of SARS-CoV-2 by FDA under an Emergency Use Authorization (EUA). This EUA will remain in effect (meaning this test can be used) for the duration of the COVID-19 declaration under Section 564(b)(1) of the Act, 21 U.S.C. section 360bbb-3(b)(1), unless the authorization is terminated or revoked.  Performed at [facility], [place] [address], [place], [place] [address]      Discharge Instructions:   Discharge Instructions     Diet - low sodium  heart healthy   Complete by: As directed    Discharge instructions   Complete by: As directed    Follow-up with your primary care physician in 1 week.  Follow-up with Dr. [name] cardiology as outpatient as scheduled by you. No overexertion.  Your atenolol has been changed to metoprolol and amlodipine.  Rest of the medications are same.  Please seek medical attention for worsening symptoms.   Increase activity slowly   Complete by: As directed       Allergies as of 12/29/2020       Reactions   Cefaclor Anaphylaxis, Rash, Swelling   Throat closed   Nitrofurantoin Anaphylaxis   Right kidney shut down macrobid   Chantix [varenicline] Other (See Comments)   nightmares   Nsaids Other (See Comments)   Due to blood thinner and asa use        Medication List     STOP taking these medications    atenolol 25 MG tablet Commonly known as: TENORMIN       TAKE these medications    amLODipine 2.5 MG tablet Commonly known as: NORVASC Take 1 tablet (2.5 mg total) by mouth daily. Start taking on: December 30, 2020   aspirin 81 MG EC tablet Take 1 tablet (81 mg total) by mouth daily at 6 (six) AM. Swallow whole.   atorvastatin 80 MG tablet Commonly known as: LIPITOR Take 1 tablet (80 mg total) by mouth every morning. What changed: when to take this   brimonidine 0.1 % Soln Commonly known as: ALPHAGAN P Place 1 drop into both eyes 2 (two) times daily.   clopidogrel 75 MG tablet Commonly known as: PLAVIX Take 1 tablet by mouth once daily   Co Q 10 100 MG Caps Take 100 mg by mouth daily.   gabapentin 100 MG capsule Commonly known as: NEURONTIN Take 1 capsule (100 mg total) by mouth 3 (three) times daily. What changed:  when to take this reasons to take this   GARLIC PO Take by mouth daily.   HYDROcodone-acetaminophen 5-325 MG tablet Commonly known as: NORCO/VICODIN Take 1 tablet by mouth every 6 (six) hours as needed for moderate pain.   ICAPS AREDS FORMULA  PO Take 1 tablet by mouth 2 (two) times daily.   latanoprost 0.005 % ophthalmic solution Commonly known as: XALATAN Place 1 drop into both eyes at bedtime.   methocarbamol 500 MG tablet Commonly known as: ROBAXIN TAKE 1 TABLET BY MOUTH EVERY 8 HOURS AS NEEDED   metoprolol succinate 25 MG 24 hr tablet Commonly known as: TOPROL-XL Take 0.5 tablets (12.5 mg total) by mouth daily. Start taking on: December 30, 2020   multivitamin tablet Take 1 tablet by mouth daily.   nitroGLYCERIN 0.4 MG SL tablet Commonly known as: NITROSTAT Place 1 tablet (0.4 mg total) under the tongue every 5 (five) minutes as needed for chest pain.   OMEGA-3 KRILL OIL PO Take 200 mg by mouth daily.   ProAir HFA 108 (90  Base) MCG/ACT inhaler Generic drug: albuterol Inhale 2 puffs into the lungs every 4 (four) hours as needed for wheezing.     tiotropium 18 MCG inhalation capsule Commonly known as: SPIRIVA Place 18 mcg into inhaler and inhale daily.   traZODone 100 MG tablet Commonly known as: DESYREL Take 100 mg by mouth at bedtime.   VITAMIN D (CHOLECALCIFEROL) PO Take 1 tablet by mouth daily.        Time coordinating discharge: 39 minutes  Signed:  Danitza Schoenfeldt  [facility] 12/29/2020, 3:17 PM

## 2021-01-04 DIAGNOSIS — R935 Abnormal findings on diagnostic imaging of other abdominal regions, including retroperitoneum: Secondary | ICD-10-CM | POA: Diagnosis not present

## 2021-01-04 DIAGNOSIS — F419 Anxiety disorder, unspecified: Secondary | ICD-10-CM | POA: Diagnosis not present

## 2021-01-04 DIAGNOSIS — Z23 Encounter for immunization: Secondary | ICD-10-CM | POA: Diagnosis not present

## 2021-01-04 DIAGNOSIS — R5381 Other malaise: Secondary | ICD-10-CM | POA: Diagnosis not present

## 2021-01-04 DIAGNOSIS — I25119 Atherosclerotic heart disease of native coronary artery with unspecified angina pectoris: Secondary | ICD-10-CM | POA: Diagnosis not present

## 2021-01-04 DIAGNOSIS — J449 Chronic obstructive pulmonary disease, unspecified: Secondary | ICD-10-CM | POA: Diagnosis not present

## 2021-01-07 ENCOUNTER — Encounter (HOSPITAL_COMMUNITY): Payer: Self-pay | Admitting: Gastroenterology

## 2021-01-12 ENCOUNTER — Other Ambulatory Visit: Payer: Self-pay | Admitting: Gastroenterology

## 2021-01-15 ENCOUNTER — Ambulatory Visit
Admission: RE | Admit: 2021-01-15 | Discharge: 2021-01-15 | Disposition: A | Discharge status: Home / Self Care | Payer: Medicare PPO | Source: Ambulatory Visit | Attending: Urology | Admitting: Urology

## 2021-01-15 ENCOUNTER — Other Ambulatory Visit: Payer: Self-pay

## 2021-01-15 DIAGNOSIS — D49511 Neoplasm of unspecified behavior of right kidney: Secondary | ICD-10-CM

## 2021-01-15 DIAGNOSIS — N2889 Other specified disorders of kidney and ureter: Secondary | ICD-10-CM | POA: Diagnosis not present

## 2021-01-15 IMAGING — MR MR ABDOMEN WO/W CM
20 series · 48 of 48 positions shown · IV contrast (12 ml Multihance)
Comparison: CT abdomen/pelvis dated [DATE]. MRI abdomen dated
[DATE].

CLINICAL DATA: Follow-up renal mass

EXAM:
MRI ABDOMEN WITHOUT AND WITH CONTRAST
TECHNIQUE: Multiplanar multisequence MR imaging of the abdomen was performed
both before and after the administration of intravenous contrast.
CONTRAST:  12mL MULTIHANCE GADOBENATE DIMEGLUMINE 529 MG/ML IV SOLN

[Series 3: T2 · coronal · 5.0mm · 0.78mm/px · 1 of 16 slices shown (1 of 3)]
[im 1/16]
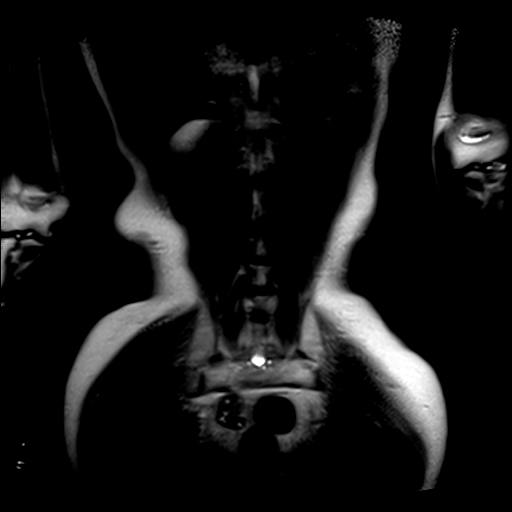

[Series 4: T2 · axial · 5.0mm · 0.66mm/px · 1 of 25 slices shown (2 of 3)]
[im 1/25]
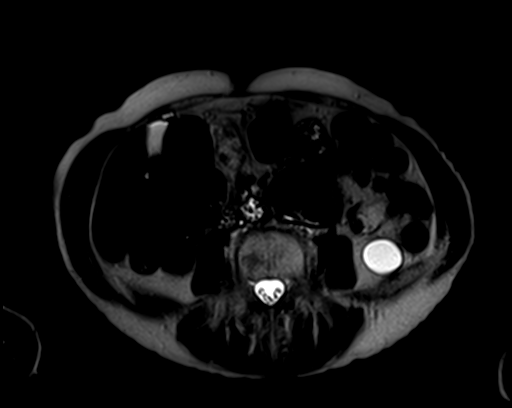

[Series 5: bSSFP · axial · 4.0mm · 0.74mm/px · 1 of 38 slices shown]
[im 1/38]
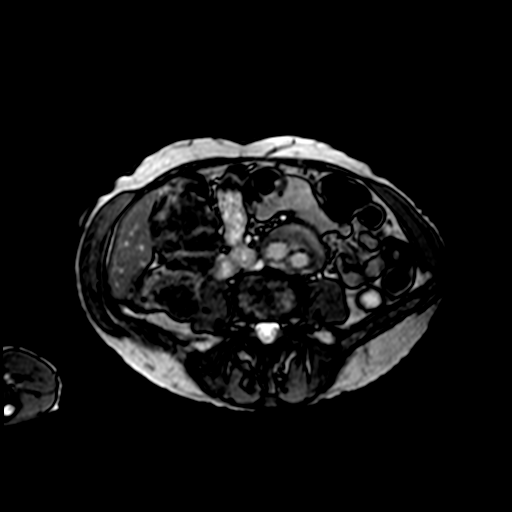

[Series 6: T1 · axial · 5.0mm · 0.66mm/px · z∈[-57,+81]mm · 2 of 48 slices shown]
[im 1/48]
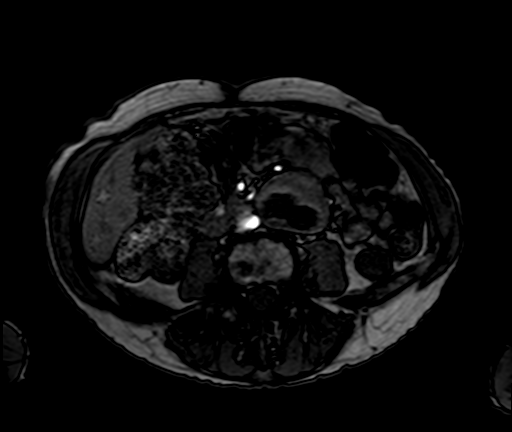
[im 48/48]
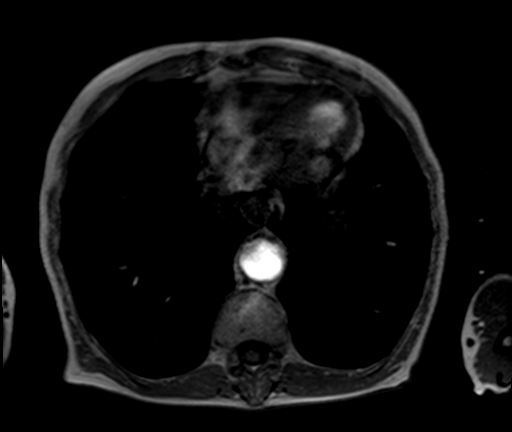

[Series 7: ep2d_diff_b50_500_800_p2_trig · axial · 6.0mm · 1.98mm/px · z∈[-52,+104]mm · 3 of 63 slices shown]
[im 1/63]
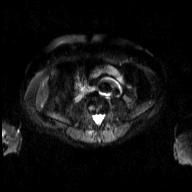
[im 32/63]
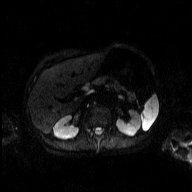
[im 63/63]
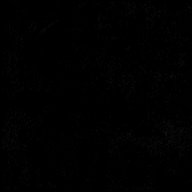

[Series 8: ep2d_diff_b50_500_800_p2_trig_adc · axial · 6.0mm · 1.98mm/px · 1 of 21 slices shown]
[im 1/21]
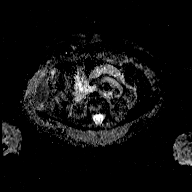

[Series 9: T2 · axial · 5.0mm · 1.33mm/px · 1 of 28 slices shown (3 of 3)]
[im 1/28]
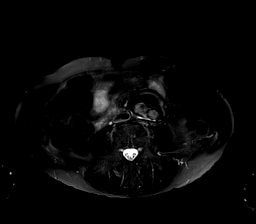

[Series 10: T1 dynamic · axial · non-contrast · 2.4mm · 1.41mm/px · z∈[-72,+80]mm · 3 of 64 slices shown]
[im 1/64]
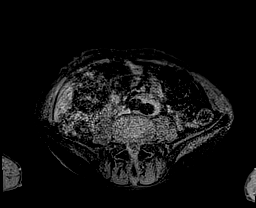
[im 32/64]
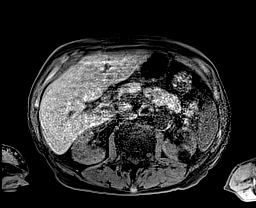
[im 64/64]
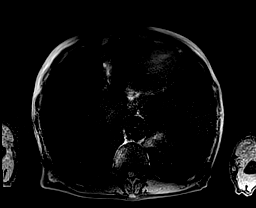

[Series 13: post 25 sec · axial · 2.4mm · 1.41mm/px · z∈[-72,+80]mm · 3 of 64 slices shown (1 of 2)]
[im 1/64]
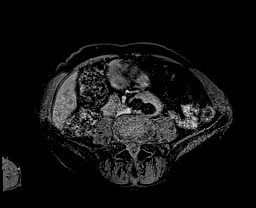
[im 32/64]
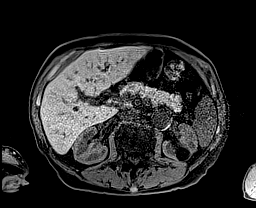
[im 64/64]
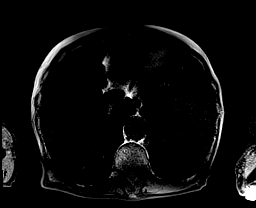

[Series 14: post 25 sec_sub · axial · 2.4mm · 1.41mm/px · z∈[-72,+80]mm · 3 of 64 slices shown]
[im 1/64]
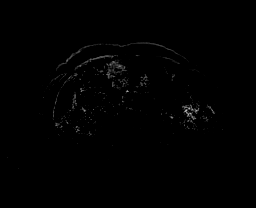
[im 32/64]
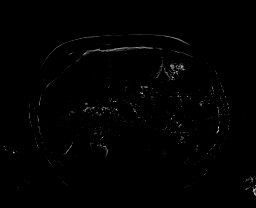
[im 64/64]
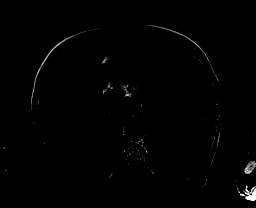

[Series 15: post 45 sec · axial · 2.4mm · 1.41mm/px · z∈[-72,+80]mm · 3 of 64 slices shown (1 of 2)]
[im 1/64]
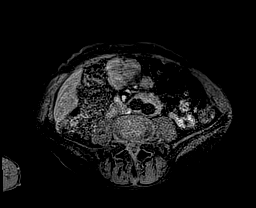
[im 32/64]
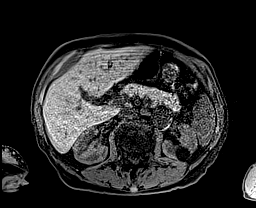
[im 64/64]
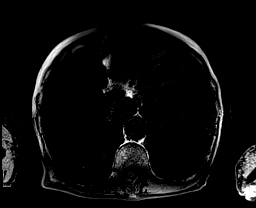

[Series 16: post 45 sec_sub · axial · 2.4mm · 1.41mm/px · z∈[-72,+80]mm · 3 of 64 slices shown]
[im 1/64]
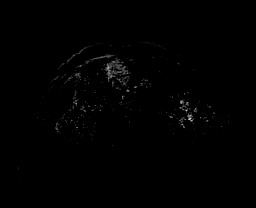
[im 32/64]
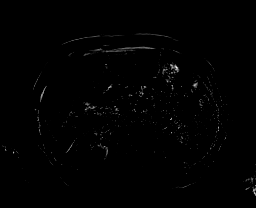
[im 64/64]
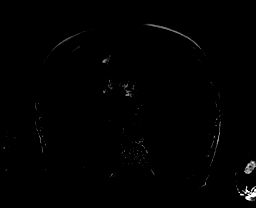

[Series 17: post 90 sec · axial · 2.4mm · 1.41mm/px · z∈[-72,+80]mm · 3 of 64 slices shown (1 of 2)]
[im 1/64]
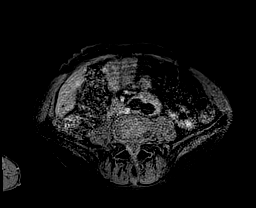
[im 32/64]
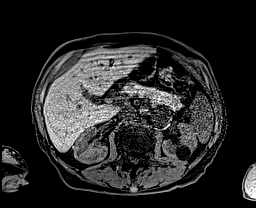
[im 64/64]
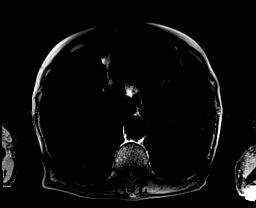

[Series 18: post 90 sec_sub · axial · 2.4mm · 1.41mm/px · z∈[-72,+80]mm · 3 of 64 slices shown]
[im 1/64]
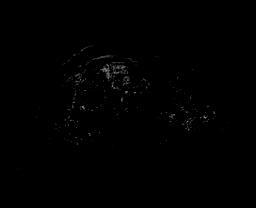
[im 32/64]
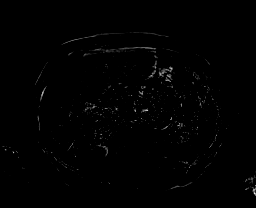
[im 64/64]
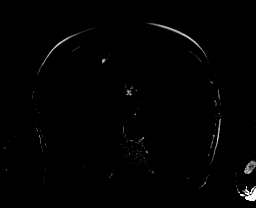

[Series 19: post 25 sec · axial · 2.3mm · 1.41mm/px · z∈[-68,+76]mm · 3 of 64 slices shown (2 of 2)]
[im 1/64]
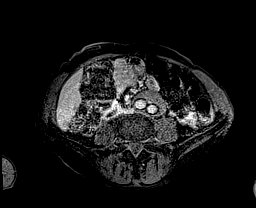
[im 32/64]
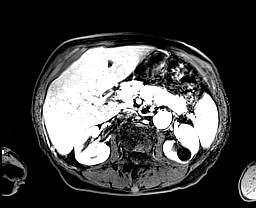
[im 64/64]
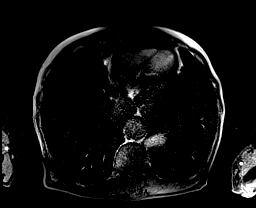

[Series 20: post 45 sec · axial · 2.3mm · 1.41mm/px · z∈[-68,+76]mm · 3 of 64 slices shown (2 of 2)]
[im 1/64]
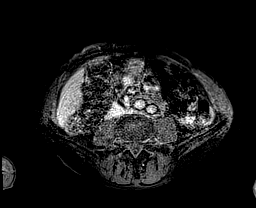
[im 32/64]
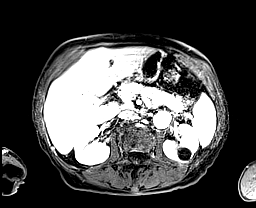
[im 64/64]
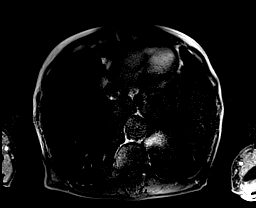

[Series 21: post 90 sec · axial · 2.3mm · 1.41mm/px · z∈[-68,+76]mm · 3 of 64 slices shown (2 of 2)]
[im 1/64]
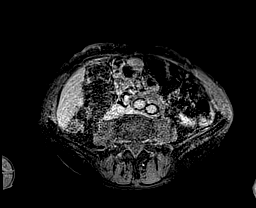
[im 32/64]
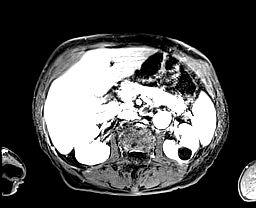
[im 64/64]
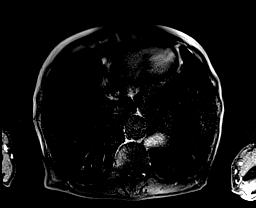

[Series 22: T1 dynamic post-contrast · coronal · 2.6mm · 0.78mm/px · 2 of 52 slices shown]
[im 1/52]
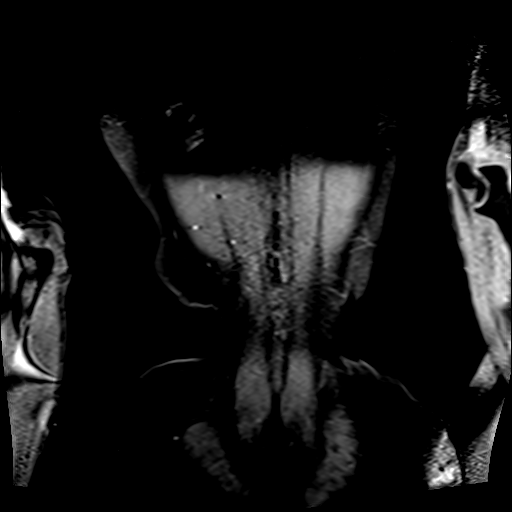
[im 52/52]
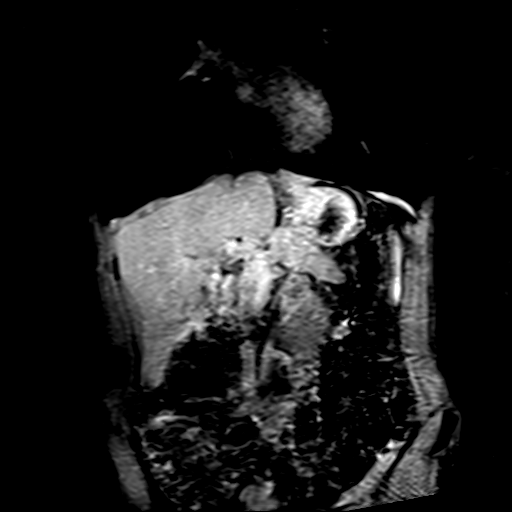

[Series 23: post axial 3+ · axial · 2.4mm · 1.41mm/px · z∈[-72,+80]mm · 3 of 64 slices shown (1 of 2)]
[im 1/64]
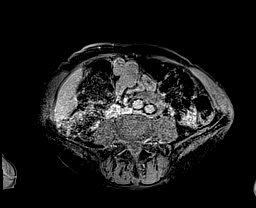
[im 32/64]
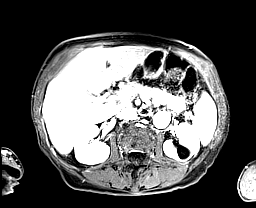
[im 64/64]
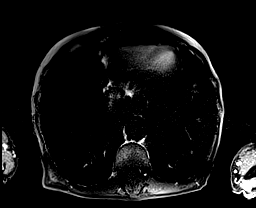

[Series 24: post axial 3+ · axial · 2.4mm · 1.41mm/px · z∈[-72,+80]mm · 3 of 64 slices shown (2 of 2)]
[im 1/64]
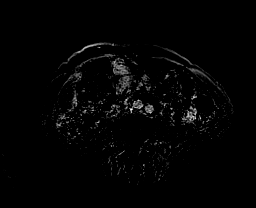
[im 32/64]
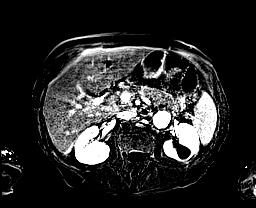
[im 64/64]
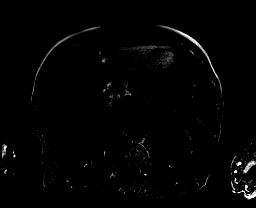

[48 of 48 positions shown; findings below may reference images not displayed]

FINDINGS: Lower chest: Lung bases are clear.

Hepatobiliary: Liver is within normal limits.

Gallbladder is unremarkable. No hepatic or extrahepatic ductal
dilatation.

Pancreas:  Within normal limits.

Spleen:  Within normal limits.

Adrenals/Urinary Tract:  Adrenal glands are within normal limits.

15 mm enhancing lesion in the lateral right upper kidney (series
9/image 25), compatible with solid renal neoplasm, grossly
unchanged.

Left renal cysts with scarring in the medial left lower kidney. No
hydronephrosis.

Stomach/Bowel: Stomach is within normal limits.

Visualized bowel is grossly unremarkable.

Vascular/Lymphatic: Status post aorta bi-iliac stent. Aneurysm sac
measures 4.1 x 5.0 cm, grossly unchanged.

No suspicious abdominal lymphadenopathy.

Other:  No abdominal ascites.

Musculoskeletal: No focal osseous lesions.
IMPRESSION: 15 mm enhancing lesion in the lateral right upper kidney, compatible
with solid renal neoplasm, grossly unchanged.

Stable 4.1 x 5.0 cm abdominal aortic aneurysm status post aorta
bi-iliac stent.

## 2021-01-15 MED ORDER — GADOBENATE DIMEGLUMINE 529 MG/ML IV SOLN
12.0000 mL | Freq: Once | INTRAVENOUS | Status: AC | PRN
  Administered 2021-01-15: 12 mL via INTRAVENOUS

## 2021-01-18 ENCOUNTER — Other Ambulatory Visit: Payer: Self-pay

## 2021-01-18 ENCOUNTER — Ambulatory Visit (HOSPITAL_COMMUNITY)
Admission: RE | Admit: 2021-01-18 | Discharge: 2021-01-18 | Disposition: A | Discharge status: Home / Self Care | Payer: Medicare PPO | Source: Ambulatory Visit | Attending: Gastroenterology | Admitting: Gastroenterology

## 2021-01-18 ENCOUNTER — Encounter (HOSPITAL_COMMUNITY): Payer: Self-pay | Admitting: Gastroenterology

## 2021-01-18 ENCOUNTER — Ambulatory Visit (HOSPITAL_COMMUNITY): Payer: Medicare PPO | Admitting: Certified Registered"

## 2021-01-18 ENCOUNTER — Encounter (HOSPITAL_COMMUNITY)
Admission: RE | Disposition: A | Discharge status: Home / Self Care | Payer: Self-pay | Source: Ambulatory Visit | Attending: Gastroenterology

## 2021-01-18 DIAGNOSIS — K219 Gastro-esophageal reflux disease without esophagitis: Secondary | ICD-10-CM | POA: Diagnosis present

## 2021-01-18 DIAGNOSIS — R12 Heartburn: Secondary | ICD-10-CM | POA: Diagnosis not present

## 2021-01-18 DIAGNOSIS — Z7902 Long term (current) use of antithrombotics/antiplatelets: Secondary | ICD-10-CM | POA: Insufficient documentation

## 2021-01-18 DIAGNOSIS — D122 Benign neoplasm of ascending colon: Secondary | ICD-10-CM | POA: Insufficient documentation

## 2021-01-18 DIAGNOSIS — K319 Disease of stomach and duodenum, unspecified: Secondary | ICD-10-CM | POA: Insufficient documentation

## 2021-01-18 DIAGNOSIS — Z7982 Long term (current) use of aspirin: Secondary | ICD-10-CM | POA: Insufficient documentation

## 2021-01-18 DIAGNOSIS — Q438 Other specified congenital malformations of intestine: Secondary | ICD-10-CM | POA: Insufficient documentation

## 2021-01-18 DIAGNOSIS — Z1211 Encounter for screening for malignant neoplasm of colon: Secondary | ICD-10-CM | POA: Insufficient documentation

## 2021-01-18 DIAGNOSIS — K635 Polyp of colon: Secondary | ICD-10-CM | POA: Diagnosis not present

## 2021-01-18 DIAGNOSIS — Z87891 Personal history of nicotine dependence: Secondary | ICD-10-CM | POA: Diagnosis not present

## 2021-01-18 DIAGNOSIS — Z955 Presence of coronary angioplasty implant and graft: Secondary | ICD-10-CM | POA: Diagnosis not present

## 2021-01-18 DIAGNOSIS — Z79899 Other long term (current) drug therapy: Secondary | ICD-10-CM | POA: Diagnosis not present

## 2021-01-18 DIAGNOSIS — Z860101 Personal history of adenomatous and serrated colon polyps: Secondary | ICD-10-CM

## 2021-01-18 DIAGNOSIS — Z9221 Personal history of antineoplastic chemotherapy: Secondary | ICD-10-CM | POA: Diagnosis not present

## 2021-01-18 DIAGNOSIS — Z86010 Personal history of colonic polyps: Secondary | ICD-10-CM

## 2021-01-18 DIAGNOSIS — K640 First degree hemorrhoids: Secondary | ICD-10-CM | POA: Diagnosis not present

## 2021-01-18 DIAGNOSIS — Z8551 Personal history of malignant neoplasm of bladder: Secondary | ICD-10-CM | POA: Diagnosis not present

## 2021-01-18 DIAGNOSIS — K2900 Acute gastritis without bleeding: Secondary | ICD-10-CM | POA: Diagnosis not present

## 2021-01-18 HISTORY — PX: ESOPHAGOGASTRODUODENOSCOPY (EGD) WITH PROPOFOL: SHX5813

## 2021-01-18 HISTORY — PX: BIOPSY: SHX5522

## 2021-01-18 HISTORY — PX: COLONOSCOPY WITH PROPOFOL: SHX5780

## 2021-01-18 HISTORY — PX: SUBMUCOSAL TATTOO INJECTION: SHX6856

## 2021-01-18 SURGERY — COLONOSCOPY WITH PROPOFOL
Anesthesia: Monitor Anesthesia Care

## 2021-01-18 MED ORDER — PROPOFOL 500 MG/50ML IV EMUL
INTRAVENOUS | Status: DC | PRN
  Administered 2021-01-18: 120 ug/kg/min via INTRAVENOUS

## 2021-01-18 MED ORDER — SODIUM CHLORIDE 0.9 % IV SOLN: INTRAVENOUS | Status: DC

## 2021-01-18 MED ORDER — LACTATED RINGERS IV SOLN: INTRAVENOUS | Status: DC

## 2021-01-18 MED ORDER — SPOT INK MARKER SYRINGE KIT
PACK | SUBMUCOSAL | Status: DC | PRN
  Administered 2021-01-18 (×2): 2 mL via SUBMUCOSAL

## 2021-01-18 MED ORDER — PROPOFOL 10 MG/ML IV BOLUS
INTRAVENOUS | Status: DC | PRN
  Administered 2021-01-18: 20 mg via INTRAVENOUS
  Administered 2021-01-18: 10 mg via INTRAVENOUS
  Administered 2021-01-18: 20 mg via INTRAVENOUS

## 2021-01-18 MED ORDER — PROPOFOL 1000 MG/100ML IV EMUL
INTRAVENOUS | Status: AC
  Filled 2021-01-18: qty 100

## 2021-01-18 SURGICAL SUPPLY — 25 items

## 2021-01-18 NOTE — Anesthesia Procedure Notes (Signed)
Procedure Name: MAC Date/Time: 01/18/2021 9:53 AM Performed by: Niel Hummer, CRNA Pre-anesthesia Checklist: Emergency Drugs available, Patient identified, Suction available and Patient being monitored Oxygen Delivery Method: Simple face mask

## 2021-01-18 NOTE — Anesthesia Postprocedure Evaluation (Signed)
Anesthesia Post Note  Patient: Connor Cuevas  Procedure(s) Performed: COLONOSCOPY WITH PROPOFOL ESOPHAGOGASTRODUODENOSCOPY (EGD) WITH PROPOFOL BIOPSY SUBMUCOSAL TATTOO INJECTION     Patient location during evaluation: Endoscopy Anesthesia Type: MAC Level of consciousness: awake and alert Pain management: pain level controlled Vital Signs Assessment: post-procedure vital signs reviewed and stable Respiratory status: spontaneous breathing, nonlabored ventilation and respiratory function stable Cardiovascular status: stable and blood pressure returned to baseline Postop Assessment: no apparent nausea or vomiting Anesthetic complications: no   No notable events documented.  Last Vitals:  Vitals:   01/18/21 1040 01/18/21 1050  BP: (!) 145/87 (!) 164/85  Pulse: 86 77  Resp: 16 (!) 21  Temp:    SpO2: 100% 100%    Last Pain:  Vitals:   01/18/21 1050  TempSrc:   PainSc: 0-No pain                 Emmi Wertheim,W. EDMOND

## 2021-01-18 NOTE — Discharge Instructions (Addendum)
YOU HAD AN ENDOSCOPIC PROCEDURE TODAY: Refer to the procedure report and other information in the discharge instructions given to you for any specific questions about what was found during the examination. If this information does not answer your questions, please call [facility] office at [phone] to clarify.   YOU SHOULD EXPECT: Some feelings of bloating in the abdomen. Passage of more gas than usual. Walking can help get rid of the air that was put into your GI tract during the procedure and reduce the bloating. If you had a lower endoscopy (such as a colonoscopy or flexible sigmoidoscopy) you may notice spotting of blood in your stool or on the toilet paper. Some abdominal soreness may be present for a day or two, also.  DIET: Your first meal following the procedure should be a light meal and then it is ok to progress to your normal diet. A half-sandwich or bowl of soup is an example of a good first meal. Heavy or fried foods are harder to digest and may make you feel nauseous or bloated. Drink plenty of fluids but you should avoid alcoholic beverages for 24 hours. If you had a esophageal dilation, please see attached instructions for diet.    ACTIVITY: Your care partner should take you home directly after the procedure. You should plan to take it easy, moving slowly for the rest of the day. You can resume normal activity the day after the procedure however YOU SHOULD NOT DRIVE, use power tools, machinery or perform tasks that involve climbing or major physical exertion for 24 hours (because of the sedation medicines used during the test).   SYMPTOMS TO REPORT IMMEDIATELY: A gastroenterologist can be reached at any hour. Please call [phone]  for any of the following symptoms:  Following lower endoscopy (colonoscopy, flexible sigmoidoscopy) Excessive amounts of blood in the stool  Significant tenderness, worsening of abdominal pains  Swelling of the abdomen that is new, acute  Fever of 100  or higher  Following upper endoscopy (EGD, EUS, ERCP, esophageal dilation) Vomiting of blood or coffee ground material  New, significant abdominal pain  New, significant chest pain or pain under the shoulder blades  Painful or persistently difficult swallowing  New shortness of breath  Black, tarry-looking or red, bloody stools  FOLLOW UP:  If any biopsies were taken you will be contacted by phone or by letter within the next 1-3 weeks. Call [phone]  if you have not heard about the biopsies in 3 weeks.  Please also call with any specific questions about appointments or follow up tests. YOU HAD AN ENDOSCOPIC PROCEDURE TODAY: Refer to the procedure report and other information in the discharge instructions given to you for any specific questions about what was found during the examination. If this information does not answer your questions, please call [facility] office at [phone] to clarify.   YOU SHOULD EXPECT: Some feelings of bloating in the abdomen. Passage of more gas than usual. Walking can help get rid of the air that was put into your GI tract during the procedure and reduce the bloating. If you had a lower endoscopy (such as a colonoscopy or flexible sigmoidoscopy) you may notice spotting of blood in your stool or on the toilet paper. Some abdominal soreness may be present for a day or two, also.  DIET: Your first meal following the procedure should be a light meal and then it is ok to progress to your normal diet. A half-sandwich or bowl of soup is an   example of a good first meal. Heavy or fried foods are harder to digest and may make you feel nauseous or bloated. Drink plenty of fluids but you should avoid alcoholic beverages for 24 hours. If you had a esophageal dilation, please see attached instructions for diet.    ACTIVITY: Your care partner should take you home directly after the procedure. You should plan to take it easy, moving slowly for the rest of the day. You can resume  normal activity the day after the procedure however YOU SHOULD NOT DRIVE, use power tools, machinery or perform tasks that involve climbing or major physical exertion for 24 hours (because of the sedation medicines used during the test).   SYMPTOMS TO REPORT IMMEDIATELY: A gastroenterologist can be reached at any hour. Please call [phone]  for any of the following symptoms:  Following lower endoscopy (colonoscopy, flexible sigmoidoscopy) Excessive amounts of blood in the stool  Significant tenderness, worsening of abdominal pains  Swelling of the abdomen that is new, acute  Fever of 100 or higher  Following upper endoscopy (EGD, EUS, ERCP, esophageal dilation) Vomiting of blood or coffee ground material  New, significant abdominal pain  New, significant chest pain or pain under the shoulder blades  Painful or persistently difficult swallowing  New shortness of breath  Black, tarry-looking or red, bloody stools  FOLLOW UP:  If any biopsies were taken you will be contacted by phone or by letter within the next 1-3 weeks. Call [phone]  if you have not heard about the biopsies in 3 weeks.  Please also call with any specific questions about appointments or follow up tests.   RESTART PLAVIX in 2 days (start back on 01/20/21).

## 2021-01-18 NOTE — Anesthesia Postprocedure Evaluation (Signed)
Anesthesia Post Note  Patient: Connor Cuevas  Procedure(s) Performed: COLONOSCOPY WITH PROPOFOL ESOPHAGOGASTRODUODENOSCOPY (EGD) WITH PROPOFOL BIOPSY SUBMUCOSAL TATTOO INJECTION     Patient location during evaluation: Other Anesthesia Type: MAC Level of consciousness: awake and alert Pain management: pain level controlled Vital Signs Assessment: post-procedure vital signs reviewed and stable Respiratory status: spontaneous breathing, nonlabored ventilation, respiratory function stable and patient connected to nasal cannula oxygen Cardiovascular status: stable and blood pressure returned to baseline Postop Assessment: no apparent nausea or vomiting Anesthetic complications: no   No notable events documented.  Last Vitals:  Vitals:   01/18/21 1040 01/18/21 1050  BP: (!) 145/87 (!) 164/85  Pulse: 86 77  Resp: 16 (!) 21  Temp:    SpO2: 100% 100%    Last Pain:  Vitals:   01/18/21 1050  TempSrc:   PainSc: 0-No pain                 Lleyton Byers,W. EDMOND

## 2021-01-18 NOTE — Interval H&P Note (Signed)
History and Physical Interval Note:  01/18/2021 9:52 AM  Connor Cuevas  has presented today for surgery, with the diagnosis of History of colon polyps/GERD/Abnormal CT scan.  The various methods of treatment have been discussed with the patient and family. After consideration of risks, benefits and other options for treatment, the patient has consented to  Procedure(s): COLONOSCOPY WITH PROPOFOL (N/A) ESOPHAGOGASTRODUODENOSCOPY (EGD) WITH PROPOFOL (N/A) as a surgical intervention.  The patient's history has been reviewed, patient examined, no change in status, stable for surgery.  I have reviewed the patient's chart and labs.  Questions were answered to the patient's satisfaction.     Lear Ng

## 2021-01-18 NOTE — Transfer of Care (Signed)
Immediate Anesthesia Transfer of Care Note  Patient: Connor Cuevas  Procedure(s) Performed: COLONOSCOPY WITH PROPOFOL ESOPHAGOGASTRODUODENOSCOPY (EGD) WITH PROPOFOL BIOPSY SUBMUCOSAL TATTOO INJECTION  Patient Location: PACU  Anesthesia Type:MAC  Level of Consciousness: drowsy  Airway & Oxygen Therapy: Patient Spontanous Breathing and Patient connected to face mask oxygen  Post-op Assessment: Report given to RN, Post -op Vital signs reviewed and stable and Patient moving all extremities X 4  Post vital signs: Reviewed and stable  Last Vitals:  Vitals Value Taken Time  BP 107/51   Temp    Pulse 83 01/18/21 1029  Resp 14   SpO2 100 % 01/18/21 1029  Vitals shown include unvalidated device data.  Last Pain:  Vitals:   01/18/21 0904  TempSrc: Axillary         Complications: No notable events documented.

## 2021-01-18 NOTE — Op Note (Addendum)
[facility] Patient Name: Connor Cuevas Procedure Date: 01/18/2021 MRN: [id number] Attending MD: Lear Ng , MD Date of Birth: 1941/03/08 CSN: [id number] Age: 80 Admit Type: Outpatient Procedure:                Upper GI endoscopy Indications:              Heartburn, Esophageal reflux Providers:                Lear Ng, MD, Dulcy Fanny, Cherylynn Ridges, Technician, Luan Moore, Technician,                            Maudry Diego, CRNA Referring MD:             [name] Medicines:                Propofol per Anesthesia, Monitored Anesthesia Care Complications:            No immediate complications. Estimated Blood Loss:     Estimated blood loss was minimal. Procedure:                Pre-Anesthesia Assessment:                           - Prior to the procedure, a History and Physical                            was performed, and patient medications and                            allergies were reviewed. The patient's tolerance of                            previous anesthesia was also reviewed. The risks                            and benefits of the procedure and the sedation                            options and risks were discussed with the patient.                            All questions were answered, and informed consent                            was obtained. Prior Anticoagulants: The patient has                            taken Plavix (clopidogrel), last dose was 3 days                            prior to procedure. ASA Grade Assessment: III - A  patient with severe systemic disease. After                            reviewing the risks and benefits, the patient was                            deemed in satisfactory condition to undergo the                            procedure.                           After obtaining informed consent, the endoscope was                             passed under direct vision. Throughout the                            procedure, the patient's blood pressure, pulse, and                            oxygen saturations were monitored continuously. The                            [id number]-[id number] ([id number]) Olympus endoscope was introduced                            through the mouth, and advanced to the second part                            of duodenum. The upper GI endoscopy was                            accomplished without difficulty. The patient                            tolerated the procedure well. Scope In: Scope Out: Findings:      The examined esophagus was normal.      The Z-line was regular and was found 40 cm from the incisors.      Segmental mild inflammation characterized by congestion (edema) and       erythema was found in the gastric antrum. Biopsies were taken with a       cold forceps for histology. Estimated blood loss was minimal.      The cardia and gastric fundus were normal on retroflexion.      The examined duodenum was normal. Impression:               - Normal esophagus.                           - Z-line regular, 40 cm from the incisors.                           - Acute gastritis. Biopsied.                           -  Normal examined duodenum. Moderate Sedation:      N/A - MAC procedure Recommendation:           - Patient has a contact number available for                            emergencies. The signs and symptoms of potential                            delayed complications were discussed with the                            patient. Return to normal activities tomorrow.                            Written discharge instructions were provided to the                            patient.                           - Follow an antireflux regimen.                           - Await pathology results.                           - Resume Plavix (clopidogrel) at prior dose see                             colonoscopy report. Procedure Code(s):        --- Professional ---                           [phone], Esophagogastroduodenoscopy, flexible,                            transoral; with biopsy, single or multiple Diagnosis Code(s):        --- Professional ---                           K21.9, Gastro-esophageal reflux disease without                            esophagitis                           R12, Heartburn                           K29.00, Acute gastritis without bleeding CPT copyright 2019 [employer]. All rights reserved. The codes documented in this report are preliminary and upon coder review may  be revised to meet current compliance requirements. Lear Ng, MD 01/18/2021 10:31:51 AM This report has been signed electronically. Number of Addenda: 0

## 2021-01-18 NOTE — Op Note (Signed)
[facility] Patient Name: Connor Cuevas Procedure Date: 01/18/2021 MRN: [id number] Attending MD: Lear Ng , MD Date of Birth: 07/27/1940 CSN: [id number] Age: 80 Admit Type: Outpatient Procedure:                Colonoscopy Indications:              High risk colon cancer surveillance: Personal                            history of colonic polyps, Last colonoscopy:                            December 2014 Providers:                Lear Ng, MD, Dulcy Fanny, Cherylynn Ridges, Technician, Luan Moore, Technician,                            Maudry Diego, CRNA Referring MD:             [name] Medicines:                Propofol per Anesthesia, Monitored Anesthesia Care Complications:            No immediate complications. Estimated Blood Loss:     Estimated blood loss was minimal. Procedure:                Pre-Anesthesia Assessment:                           - Prior to the procedure, a History and Physical                            was performed, and patient medications and                            allergies were reviewed. The patient's tolerance of                            previous anesthesia was also reviewed. The risks                            and benefits of the procedure and the sedation                            options and risks were discussed with the patient.                            All questions were answered, and informed consent                            was obtained. Prior Anticoagulants: The patient has                            taken  Plavix (clopidogrel), last dose was 3 days                            prior to procedure. ASA Grade Assessment: III - A                            patient with severe systemic disease. After                            reviewing the risks and benefits, the patient was                            deemed in satisfactory condition to undergo the                             procedure.                           After obtaining informed consent, the colonoscope                            was passed under direct vision. Throughout the                            procedure, the patient's blood pressure, pulse, and                            oxygen saturations were monitored continuously. The                            PCF-HQ190L ([id number] Olympus colonoscope was                            introduced through the anus and advanced to the the                            cecum, identified by appendiceal orifice and                            ileocecal valve. The colonoscopy was performed with                            difficulty due to a tortuous colon and fair prep.                            Successful completion of the procedure was aided by                            straightening and shortening the scope to obtain                            bowel loop reduction and lavage. The patient  tolerated the procedure well. The quality of the                            bowel preparation was fair. The ileocecal valve,                            appendiceal orifice, and rectum were photographed. Scope In: 10:06:26 AM Scope Out: 10:25:47 AM Scope Withdrawal Time: 0 hours 16 minutes 59 seconds  Total Procedure Duration: 0 hours 19 minutes 21 seconds  Findings:      The perianal and digital rectal examinations were normal.      A 50 mm polypoid lesion was found in the proximal ascending colon. The       lesion was multi-lobulated. No bleeding was present. Biopsies were taken       with a cold forceps for histology. Estimated blood loss was minimal.       Area was tattooed with an injection of 2 mL of [place] ink.      A 20 mm polyp was found in the mid ascending colon. The polyp was       carpet-like. Biopsies were taken with a cold forceps for histology.       Estimated blood loss was minimal. Area was tattooed with an injection of        2 mL of [place] ink.      A 14 mm polyp was found in the ascending colon. The polyp was       carpet-like. Polypectomy was not attempted due to polypoid lesion in       same segment.      Internal hemorrhoids were found during retroflexion. The hemorrhoids       were medium-sized and Grade I (internal hemorrhoids that do not       prolapse). Impression:               - Preparation of the colon was fair.                           - Rule out malignancy, polypoid lesion in the                            proximal ascending colon. Biopsied. Tattooed.                           - One 20 mm polyp in the mid ascending colon.                            Biopsied. Tattooed.                           - One 14 mm polyp in the ascending colon. Resection                            not attempted.                           - Internal hemorrhoids. Moderate Sedation:      N/A - MAC procedure Recommendation:           - Patient  has a contact number available for                            emergencies. The signs and symptoms of potential                            delayed complications were discussed with the                            patient. Return to normal activities tomorrow.                            Written discharge instructions were provided to the                            patient.                           - High fiber diet.                           - Await pathology results.                           - Repeat colonoscopy (date not yet determined) for                            surveillance based on pathology results.                           - Refer to a surgeon at appointment to be scheduled.                           - Resume Plavix (clopidogrel) at prior dose in 2                            days. Procedure Code(s):        --- Professional ---                           [phone], Colonoscopy, flexible; with directed                            submucosal injection(s), any substance                            22297, Colonoscopy, flexible; with biopsy, single                            or multiple Diagnosis Code(s):        --- Professional ---                           Z86.010, Personal history of colonic polyps                           D49.0,  Neoplasm of unspecified behavior of                            digestive system                           K63.5, Polyp of colon                           K64.0, First degree hemorrhoids CPT copyright 2019 [employer]. All rights reserved. The codes documented in this report are preliminary and upon coder review may  be revised to meet current compliance requirements. Lear Ng, MD 01/18/2021 10:42:57 AM This report has been signed electronically. Number of Addenda: 0

## 2021-01-18 NOTE — Anesthesia Preprocedure Evaluation (Addendum)
Anesthesia Evaluation  Patient identified by MRN, date of birth, ID band Patient awake    Reviewed: Allergy & Precautions, H&P , NPO status , Patient's Chart, lab work & pertinent test results  Airway Mallampati: II  TM Distance: >3 FB Neck ROM: Full    Dental no notable dental hx. (+) Edentulous Upper, Dental Advisory Given   Pulmonary shortness of breath, COPD,  COPD inhaler, former smoker,    Pulmonary exam normal breath sounds clear to auscultation       Cardiovascular + CAD, + Cardiac Stents and + Peripheral Vascular Disease   Rhythm:Regular Rate:Normal     Neuro/Psych Anxiety negative neurological ROS     GI/Hepatic negative GI ROS, Neg liver ROS,   Endo/Other  negative endocrine ROS  Renal/GU Renal disease  negative genitourinary   Musculoskeletal  (+) Arthritis , Osteoarthritis,    Abdominal   Peds  Hematology negative hematology ROS (+)   Anesthesia Other Findings   Reproductive/Obstetrics negative OB ROS                            Anesthesia Physical Anesthesia Plan  ASA: 3  Anesthesia Plan: MAC   Post-op Pain Management:    Induction: Intravenous  PONV Risk Score and Plan: 1 and Propofol infusion  Airway Management Planned: Simple Face Mask  Additional Equipment:   Intra-op Plan:   Post-operative Plan:   Informed Consent: I have reviewed the patients History and Physical, chart, labs and discussed the procedure including the risks, benefits and alternatives for the proposed anesthesia with the patient or authorized representative who has indicated his/her understanding and acceptance.     Dental advisory given  Plan Discussed with: CRNA  Anesthesia Plan Comments:         Anesthesia Quick Evaluation

## 2021-01-18 NOTE — H&P (Signed)
Date of Initial H&P: 01/12/21  History reviewed, patient examined, no change in status, stable for surgery.

## 2021-01-19 LAB — SURGICAL PATHOLOGY

## 2021-01-19 NOTE — Addendum Note (Signed)
Addendum  created 01/19/21 1457 by Roderic Palau, MD   Clinical Note Signed

## 2021-02-15 ENCOUNTER — Other Ambulatory Visit: Payer: Self-pay

## 2021-02-15 ENCOUNTER — Encounter: Payer: Self-pay | Admitting: Vascular Surgery

## 2021-02-15 ENCOUNTER — Ambulatory Visit: Payer: Medicare PPO | Admitting: Vascular Surgery

## 2021-02-15 VITALS — BP 131/78 | HR 83 | Temp 98.2°F | Resp 16 | Ht 69.0 in | Wt 126.2 lb

## 2021-02-15 DIAGNOSIS — I7143 Infrarenal abdominal aortic aneurysm, without rupture: Secondary | ICD-10-CM

## 2021-02-15 DIAGNOSIS — I714 Abdominal aortic aneurysm, without rupture, unspecified: Secondary | ICD-10-CM | POA: Diagnosis not present

## 2021-02-15 DIAGNOSIS — Z95828 Presence of other vascular implants and grafts: Secondary | ICD-10-CM | POA: Diagnosis not present

## 2021-02-15 NOTE — Progress Notes (Signed)
Patient name: Connor Cuevas MRN: [id number] DOB: [birth year] Sex: male  REASON FOR VISIT: 20-month follow-up after EVAR for abdominal aortic aneurysm  HPI: Connor Cuevas is a 80 y.o. male with multiple medical problems that presents for 6 month follow-up for surveillance after EVAR for a 5.9 cm abdominal aortic aneurysm on 06/21/2020.  This was done with percutaneous access but did require bilateral common femoral cutdowns after percutaneous device closure failed.    Since last evaluation he states he has undergone transurethral bladder resection and chemo for bladder cancer.  He just underwent a colonoscopy and now needs to see a Psychologist, sport and exercise.  Otherwise reports no abdominal or back pain.  No concerns as it related to his AAA.  Past Medical History:  Diagnosis Date   AAA (abdominal aortic aneurysm) 04/16/2018   4.3 cm, noted on [place]    Acute kidney failure ([place]) 2005 approx   after ureteroscopy, hospitalized for about 10 days afterwards   Anxiety    Arthritis    Hands, wrist, elbow, back and spine oa   Bladder mass    Blind right eye since 07-2019   Bulging lumbar disc    L4-L5 degeneration, Chronic back pain   CAD (coronary artery disease)    a. 01/09/14: [place] s/p DES to RCA    COPD (chronic obstructive pulmonary disease) ([place])    wheezing   Dyspnea    with exertion   Glaucoma of both eyes    Hepatic cyst 2013   Several tiny hepatic cysts , noted on CT   History of kidney stones    small kidney stones in right kidney   Hyperlipidemia    Interstitial lung disease ([place])    managed by pcp dr Memory Dance swayne   Pneumonia yrs ago   Pulmonary nodule, right 08/02/2016   a. 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 Aortosclerosis, bilateral - mild with calcification. Per Dr. Moreen Fowler at [facility]    Wears dentures    upper    Wears glasses      Past Surgical History:  Procedure Laterality Date   ABDOMINAL AORTIC ENDOVASCULAR STENT GRAFT  06/21/2020   Procedure: ABDOMINAL AORTIC ENDOVASCULAR STENT GRAFT;  Surgeon: Marty Heck, MD;  Location: [place];  Service: Vascular;;   BIOPSY  01/18/2021   Procedure: BIOPSY;  Surgeon: Wilford Corner, MD;  Location: [facility];  Service: Endoscopy;;  EGD and COLON   CARDIAC CATHETERIZATION  2017   right carotid stent   CATARACT EXTRACTION W/ INTRAOCULAR LENS  IMPLANT, BILATERAL  1997 (APPROX)   CATARACT EXTRACTION W/PHACO Left 03/31/2020   Procedure: CATARACT EXTRACTION PHACO AND INTRAOCULAR LENS PLACEMENT (IOC) LEFT 5.51 01:16.1 7.2%;  Surgeon: Leandrew Koyanagi, MD;  Location: [place];  Service: Ophthalmology;  Laterality: Left;   COLONOSCOPY  2017   COLONOSCOPY WITH PROPOFOL N/A 01/18/2021   Procedure: COLONOSCOPY WITH PROPOFOL;  Surgeon: Wilford Corner, MD;  Location: [facility];  Service: Endoscopy;  Laterality: N/A;   CYSTOSCOPY W/ URETERAL STENT PLACEMENT Bilateral 08/13/2020   Procedure: CYSTOSCOPY WITH BILATERAL RETROGRADE PYELOGRAM;  Surgeon: Janith Lima, MD;  Location: [facility];  Service: Urology;  Laterality: Bilateral;   CYSTOSCOPY/RETROGRADE/URETEROSCOPY/STONE EXTRACTION WITH BASKET  10/17/2011   Procedure: CYSTOSCOPY/RETROGRADE/URETEROSCOPY/STONE EXTRACTION WITH BASKET;  Surgeon: Hanley Ben,  MD;  Location: [place];  Service: Urology;  Laterality: Right;   CYSTOSCOPY/URETEROSCOPY/HOLMIUM LASER/STENT PLACEMENT Right 05/13/2018   Procedure: CYSTOSCOPY/RETROGRADE/URETEROSCOPY/HOLMIUM LASER;  Surgeon: Kathie Rhodes, MD;  Location[facility];  Service: Urology;  Laterality: Right;   ESOPHAGOGASTRODUODENOSCOPY (EGD) WITH PROPOFOL N/A 01/18/2021   Procedure: ESOPHAGOGASTRODUODENOSCOPY (EGD) WITH PROPOFOL;  Surgeon: Wilford Corner, MD;  [facility];  Service: Endoscopy;  Laterality:  N/A;   HERNIA REPAIR  15 yrs ago   bil inguinal   LAPAROSCOPIC LEFT INGUINAL (OBTURATOR) HERNIA AND UMBILICAL HERNIA REPAIR  01-13-2011   LEFT HEART CATHETERIZATION WITH CORONARY ANGIOGRAM N/A 01/09/2014   Procedure: LEFT HEART CATHETERIZATION WITH CORONARY ANGIOGRAM;  Surgeon: Josue Hector, MD;  Location: [facility];  Service: Cardiovascular;  Laterality: N/A;   LEFT URETEROSCOPIC STONE EXTRACTION  11-30-2000   W/ PREVIOUS ESWL AND URETERAL STENT PLACEMENT   PHOTOCOAGULATION Right 12/10/2019   Procedure: TRANSCLEARAL DIODE CYCLOPHOTOCOAGULATION RIGHT;  Surgeon: Leandrew Koyanagi, MD;  Location: [place];  Service: Ophthalmology;  Laterality: Right;   REPAIR RIGHT INGUINAL HERNIA W/ MESH  12-31-2000   SUBMUCOSAL TATTOO INJECTION  01/18/2021   Procedure: SUBMUCOSAL TATTOO INJECTION;  Surgeon: Wilford Corner, MD;  Location: [facility];  Service: Endoscopy;;   TONSILLECTOMY  age 24   TRANSURETHRAL RESECTION OF BLADDER TUMOR N/A 08/13/2020   Procedure: TRANSURETHRAL RESECTION OF BLADDER TUMOR (TURBT) WITH POSTOPERATIVE INSTILLATION OF GEMCITABINE;  Surgeon: Janith Lima, MD;  Location: [facility];  Service: Urology;  Laterality: N/A;  GENERAL ANESTHESIA WITH INTUBATED, PARALYSIS   ULTRASOUND GUIDANCE FOR VASCULAR ACCESS Bilateral 06/21/2020   Procedure: ULTRASOUND GUIDANCE FOR VASCULAR ACCESS;  Surgeon: Marty Heck, MD;  Location: [place];  Service: Vascular;  Laterality: Bilateral;   URETEROLITHOTOMY  2009    Family History  Problem Relation Age of Onset   Hypertension Mother    Heart attack Other        uncle   Stroke Other    Stroke Maternal Grandmother    Stroke Maternal Grandfather    Stroke Paternal Grandmother    Stroke Paternal Grandfather     SOCIAL HISTORY: Social History   Tobacco Use   Smoking status: Former    Packs/day: 1.00    Years: 53.00    Pack years: 53.00    Types: Cigarettes    Quit date: 03/11/2018    Years since  quitting: 2.9   Smokeless tobacco: Never  Substance Use Topics   Alcohol use: No    Allergies  Allergen Reactions   Cefaclor Anaphylaxis, Rash and Swelling    Throat closed   Nitrofurantoin Anaphylaxis    Right kidney shut down macrobid   Chantix [Varenicline] Other (See Comments)    nightmares   Nsaids Other (See Comments)    Due to blood thinner and asa use    Current Outpatient Medications  Medication Sig Dispense Refill   amLODipine (NORVASC) 2.5 MG tablet Take 1 tablet (2.5 mg total) by mouth daily. 30 tablet 2   aspirin EC 81 MG EC tablet Take 1 tablet (81 mg total) by mouth daily at 6 (six) AM. Swallow whole.     atorvastatin (LIPITOR) 80 MG tablet Take 1 tablet (80 mg total) by mouth every morning. (Patient taking differently: Take 80 mg by mouth daily.) 90 tablet 3   brimonidine (ALPHAGAN P) 0.1 % SOLN Place 1 drop into both eyes 2 (two) times daily.     cholecalciferol (VITAMIN D3) 25 MCG (1000 UNIT) tablet  Take 1,000 Units by mouth daily.     clopidogrel (PLAVIX) 75 MG tablet Take 1 tablet by mouth once daily 90 tablet 2   Coenzyme Q10 (CO Q 10) 100 MG CAPS Take 100 mg by mouth daily.     GARLIC PO Take 1 tablet by mouth daily.     HYDROcodone-acetaminophen (NORCO/VICODIN) 5-325 MG tablet Take 1 tablet by mouth every 6 (six) hours as needed for moderate pain. 12 tablet 0   latanoprost (XALATAN) 0.005 % ophthalmic solution Place 1 drop into both eyes at bedtime.     metoprolol succinate (TOPROL-XL) 25 MG 24 hr tablet Take 0.5 tablets (12.5 mg total) by mouth daily. 15 tablet 2   Multiple Vitamin (MULTIVITAMIN) tablet Take 1 tablet by mouth daily.     Multiple Vitamins-Minerals (ICAPS AREDS FORMULA PO) Take 1 tablet by mouth 2 (two) times daily.     nitroGLYCERIN (NITROSTAT) 0.4 MG SL tablet Place 1 tablet (0.4 mg total) under the tongue every 5 (five) minutes as needed for chest pain. 25 tablet 12   OMEGA-3 KRILL OIL PO Take 200 mg by mouth daily.     PROAIR HFA 108 (90  Base) MCG/ACT inhaler Inhale 2 puffs into the lungs every 4 (four) hours as needed for wheezing.     pseudoephedrine (SUDAFED) 120 MG 12 hr tablet Take 120 mg by mouth daily as needed for congestion.     tiotropium (SPIRIVA) 18 MCG inhalation capsule Place 18 mcg into inhaler and inhale daily.     traZODone (DESYREL) 100 MG tablet Take 100 mg by mouth at bedtime.      No current facility-administered medications for this visit.    REVIEW OF SYSTEMS:  [X]  denotes positive finding, [ ]  denotes negative finding Cardiac  Comments:  Chest pain or chest pressure:    Shortness of breath upon exertion:    Short of breath when lying flat:    Irregular heart rhythm:        Vascular    Pain in calf, thigh, or hip brought on by ambulation:    Pain in feet at night that wakes you up from your sleep:     Blood clot in your veins:    Leg swelling:         Pulmonary    Oxygen at home:    Productive cough:     Wheezing:         Neurologic    Sudden weakness in arms or legs:     Sudden numbness in arms or legs:     Sudden onset of difficulty speaking or slurred speech:    Temporary loss of vision in one eye:     Problems with dizziness:         Gastrointestinal    Blood in stool:     Vomited blood:         Genitourinary    Burning when urinating:     Blood in urine:        Psychiatric    Major depression:         Hematologic    Bleeding problems:    Problems with blood clotting too easily:        Skin    Rashes or ulcers:        Constitutional    Fever or chills:      PHYSICAL EXAM: Vitals:   02/15/21 0914  BP: 131/78  Pulse: 83  Resp: 16  Temp: 98.2 F (36.8 C)  TempSrc: Temporal  SpO2: 99%  Weight: 126 lb 3.2 oz (57.2 kg)  Height: 5\' 9"  (1.753 m)    GENERAL: The patient is a well-nourished male, in no acute distress. The vital signs are documented above. CARDIAC: There is a regular rate and rhythm.  VASCULAR:  Bilateral groin incisions well-healed Bilateral  femoral pulses palpable Bilateral DP pulses palpable Abdomen: No pain with deep palpation.  Soft. Non-tender.  DATA:   CT abdomen pelvis with contrast reviewed from 12/28/2020 that shows stent graft in excellent position.  No evidence of endoleak.  Aneurysm sac is decreasing in size and maximal diameter measures 4.9 cm by my measurement on both coronal and sagittal imaging.  Assessment/Plan:  80 year old male status post EVAR on 06/21/2020 for a 5.9 cm abdominal aortic aneurysm.  He was scheduled for EVAR duplex today but we canceled this study given he had a recent CT scan in the ED on 12/28/2020.  I reviewed this imaging with the patient and discussed stent graft is in good position.  No evidence of endoleak.  Aneurysm appears to be decreasing in size and maximal diameter is 4.9 cm by my measurement on both coronal and sagittal imaging.  Very pleased with his progress.  He has palpable femoral and pedal pulses.  I will see him in 1 year with EVAR duplex.  Discussed he call with questions or concerns   Marty Heck, MD [facility] Office: [phone]

## 2021-02-18 DIAGNOSIS — N3001 Acute cystitis with hematuria: Secondary | ICD-10-CM | POA: Diagnosis not present

## 2021-02-18 DIAGNOSIS — J0100 Acute maxillary sinusitis, unspecified: Secondary | ICD-10-CM | POA: Diagnosis not present

## 2021-02-18 DIAGNOSIS — M5431 Sciatica, right side: Secondary | ICD-10-CM | POA: Diagnosis not present

## 2021-02-18 DIAGNOSIS — R059 Cough, unspecified: Secondary | ICD-10-CM | POA: Diagnosis not present

## 2021-02-18 DIAGNOSIS — R0981 Nasal congestion: Secondary | ICD-10-CM | POA: Diagnosis not present

## 2021-02-18 DIAGNOSIS — R350 Frequency of micturition: Secondary | ICD-10-CM | POA: Diagnosis not present

## 2021-02-28 ENCOUNTER — Telehealth: Payer: Self-pay | Admitting: *Deleted

## 2021-02-28 ENCOUNTER — Ambulatory Visit: Payer: Self-pay | Admitting: General Surgery

## 2021-02-28 DIAGNOSIS — N390 Urinary tract infection, site not specified: Secondary | ICD-10-CM | POA: Diagnosis not present

## 2021-02-28 DIAGNOSIS — F439 Reaction to severe stress, unspecified: Secondary | ICD-10-CM | POA: Diagnosis not present

## 2021-02-28 DIAGNOSIS — I2510 Atherosclerotic heart disease of native coronary artery without angina pectoris: Secondary | ICD-10-CM | POA: Diagnosis not present

## 2021-02-28 DIAGNOSIS — D122 Benign neoplasm of ascending colon: Secondary | ICD-10-CM | POA: Diagnosis not present

## 2021-02-28 MED ORDER — GENTAMICIN SULFATE 40 MG/ML IJ SOLN: 5.0000 mg/kg | INTRAVENOUS | Status: AC

## 2021-02-28 MED ORDER — DEXTROSE 5 % IV SOLN: 900.0000 mg | INTRAVENOUS | Status: AC

## 2021-02-28 NOTE — Telephone Encounter (Signed)
   Pre-operative Risk Assessment    Patient Name: Connor Cuevas  DOB: 02-04-1941 MRN: [id number]      Request for Surgical Clearance   Procedure:   PARTIAL COLECTOMY  Date of Surgery: Clearance TBD                                 Surgeon:  DR. Leighton Ruff Surgeon's Group or Practice Name:  [facility] Phone number:  [phone] Fax number:  [phone] ATTN: Mammie Lorenzo, LPN   Type of Clearance Requested: - Medical  - Pharmacy:  Hold Aspirin and Clopidogrel (Plavix)     Type of Anesthesia:   General    Additional requests/questions:   Jiles Prows   02/28/2021, 3:51 PM

## 2021-02-28 NOTE — H&P (Signed)
REFERRING PHYSICIAN:  Lear Ng, MD   PROVIDER:  Monico Blitz, MD   MRN: [id number] DOB: July 06, 1940 DATE OF ENCOUNTER: 02/28/2021   Subjective    Chief Complaint: Colon Polyps (Ascending colon polypoid lesion)       History of Present Illness: Connor Cuevas is a 80 y.o. male who is seen today as an office consultation at the request of Dr. Michail Sermon for evaluation of Colon Polyps (Ascending colon polypoid lesion) .  80 year old male who presents to the office for evaluation of a polypoid lesion found on recent colonoscopy.  This was performed due to a personal history of colon polyps.  The colonoscopy was listed as difficult due to a tortuous colon.  A 5 cm polypoid lesion was found in the proximal ascending colon. There were 2 other polyps in the mid ascending colon.  Biopsies of these lesions were taken and both areas were tattooed.  Pathology showed tubular adenomas with no high-grade dysplasia or malignancy.  Patient has a history of COPD and coronary artery disease. He is a 53-pack-year smoker.  He is status post endovascular scaler aortic stent graft earlier this year.  He does not have any surgical history within his abdomen.  Recent CT scan of the abdomen and pelvis shows no concerning masses or lesions within the abdomen.  He has bladder cancer and follows with Dr. Abner Greenspan.     Review of Systems: A complete review of systems was obtained from the patient.  I have reviewed this information and discussed as appropriate with the patient.  See HPI as well for other ROS.         Medical History: Past Medical History      Past Medical History:  Diagnosis Date   Aneurysm (CMS-HCC)     Anxiety     Arthritis     COPD (chronic obstructive pulmonary disease) (CMS-HCC)     Glaucoma (increased eye pressure)     History of cancer     Hypertension             Patient Active Problem List  Diagnosis   Abdominal aortic aneurysm without rupture   Aortic valve disorder    Atherosclerosis of aorta (CMS-HCC)   Unstable angina (CMS-HCC)   Acute exacerbation of chronic obstructive airways disease (CMS-HCC)   Gastro-esophageal reflux disease without esophagitis   Hx of adenomatous colonic polyps      Past Surgical History  History reviewed. No pertinent surgical history.      Allergies       Allergies  Allergen Reactions   Nitrofurantoin Monohyd/M-Cryst Other (See Comments)      Right kidney shut down               Current Outpatient Medications on File Prior to Visit  Medication Sig Dispense Refill   clopidogreL (PLAVIX) 75 mg tablet Take 1 tablet (75 mg total) by mouth once daily       HYDROcodone-acetaminophen (NORCO) 5-325 mg tablet Take by mouth       atenoloL (TENORMIN) 25 MG tablet Take 1 tablet (25 mg total) by mouth once daily       atorvastatin (LIPITOR) 80 MG tablet Take 1 tablet (80 mg total) by mouth every morning       brimonidine (ALPHAGAN P) 0.1 % ophthalmic solution Apply 1 drop to eye 2 (two) times daily       dorzolamide-timoloL (COSOPT) 22.3-6.8 mg/mL ophthalmic solution INSTILL 1 DROP INTO AFFECTED EYE THREE TIMES DAILY  tiotropium (SPIRIVA) 18 mcg inhalation capsule Place into inhaler and inhale       traZODone (DESYREL) 100 MG tablet          No current facility-administered medications on file prior to visit.      Family History       Family History  Problem Relation Age of Onset   High blood pressure (Hypertension) Mother          Social History       Tobacco Use  Smoking Status Unknown  Smokeless Tobacco Current      Social History  Social History        Socioeconomic History   Marital status: Married  Tobacco Use   Smoking status: Unknown   Smokeless tobacco: Current  Substance and Sexual Activity   Alcohol use: Never   Drug use: Never        Objective:         Vitals:    02/28/21 1044  BP: (!) 142/76  Pulse: 106  Temp: 36.9 C (98.4 F)  SpO2: 97%  Weight: 57.2 kg (126 lb)   Height: 175.3 cm (5\' 9" )      Exam Gen: NAD Abd: soft, nondistended       Labs, Imaging and Diagnostic Testing:  Recent CT scan of the abdomen and pelvis shows no concerning masses or lesions within the abdomen. Images personally reviewed.  Redundant transverse colon CT angio of the abdomen and pelvis performed April 2022 shows a stable aortic stent graft.  SMA and celiac are patent.     Assessment and Plan:  Diagnoses and all orders for this visit:   Adenomatous polyp of ascending colon -     polyethylene glycol (MIRALAX) powder; Take 233.75 g by mouth once for 1 dose Take according to your procedure prep instructions. -     bisacodyL (DULCOLAX) 5 mg EC tablet; Take 4 tablets (20 mg total) by mouth once for 1 dose -     metroNIDAZOLE (FLAGYL) 500 MG tablet; Take 2 tablets (1,000 mg total) by mouth 3 (three) times daily for 6 doses Take according to your procedure colon prep instructions -     neomycin 500 mg tablet; Take 2 tablets (1,000 mg total) by mouth 3 (three) times daily for 3 doses Take according to your procedure colon prep instructions     80 year old male smoker who presents to the office for multiple ascending colon polyps.  Biopsies show no signs of malignancy.  The area has been tattooed.  I have recommended proceeding with a robotic assisted partial colectomy.  We will plan on proceeding with intravascular assessment of perfusion due to his ongoing smoking.  We have discussed this in detail including risk associated with smoking around the surgery time.  All questions were answered.   The surgery and anatomy were described to the patient as well as the risks of surgery and the possible complications.  These include: Bleeding, deep abdominal infections and possible wound complications such as hernia and infection, damage to adjacent structures, leak of surgical connections, which can lead to other surgeries and possibly an ostomy, possible need for other procedures, such as  abscess drains in radiology, possible prolonged hospital stay, possible diarrhea from removal of part of the colon, possible constipation from narcotics, possible bowel, bladder or sexual dysfunction if having rectal surgery, prolonged fatigue/weakness or appetite loss, possible early recurrence of of disease, possible complications of their medical problems such as heart disease or arrhythmias or lung  problems, death (less than 1%). I believe the patient understands and wishes to proceed with the surgery.

## 2021-03-01 NOTE — Telephone Encounter (Signed)
Pt is scheduled to see Dr. [name] tomorrow, 03/02/21, and clearance will be addressed at that time.  Will route back to the requesting surgeon's office to make them aware.

## 2021-03-01 NOTE — Telephone Encounter (Signed)
   Name: [name]  DOB: Feb 05, 1941  MRN: [id number]  Primary Cardiologist: [name], MD  Chart reviewed as part of pre-operative protocol coverage. Because of Bradie M Andalon's past medical history and time since last visit, he will require a follow-up visit in order to better assess preoperative cardiovascular risk.  Pt has not been seen after recent hospitalization for dyspnea/PND.   I will also forward this to Dr. [name] to comment on holding ASA and plavix for partial colectomy (s/p PCI in 2015).    Pre-op covering staff: - Please schedule appointment and call patient to inform them. If patient already had an upcoming appointment within acceptable timeframe, please add "pre-op clearance" to the appointment notes so provider is aware. - Please contact requesting surgeon's office via preferred method (i.e, phone, fax) to inform them of need for appointment prior to surgery.  If applicable, this message will also be routed to pharmacy pool and/or primary cardiologist for input on holding anticoagulant/antiplatelet agent as requested below so that this information is available to the clearing provider at time of patient's appointment.   Tami Lin Shigeko Manard, PA  03/01/2021, 1:45 PM

## 2021-03-02 ENCOUNTER — Encounter: Payer: Self-pay | Admitting: Cardiovascular Disease

## 2021-03-02 ENCOUNTER — Other Ambulatory Visit: Payer: Self-pay

## 2021-03-02 ENCOUNTER — Ambulatory Visit: Payer: Medicare PPO | Admitting: Cardiovascular Disease

## 2021-03-02 VITALS — BP 140/90 | HR 68 | Ht 69.0 in | Wt 127.0 lb

## 2021-03-02 DIAGNOSIS — Z01810 Encounter for preprocedural cardiovascular examination: Secondary | ICD-10-CM

## 2021-03-02 DIAGNOSIS — I2583 Coronary atherosclerosis due to lipid rich plaque: Secondary | ICD-10-CM | POA: Diagnosis not present

## 2021-03-02 DIAGNOSIS — I714 Abdominal aortic aneurysm, without rupture, unspecified: Secondary | ICD-10-CM

## 2021-03-02 DIAGNOSIS — E782 Mixed hyperlipidemia: Secondary | ICD-10-CM

## 2021-03-02 DIAGNOSIS — I200 Unstable angina: Secondary | ICD-10-CM

## 2021-03-02 DIAGNOSIS — I2510 Atherosclerotic heart disease of native coronary artery without angina pectoris: Secondary | ICD-10-CM

## 2021-03-02 NOTE — Patient Instructions (Signed)

## 2021-03-02 NOTE — Progress Notes (Signed)
Patient ID: Connor Cuevas, male   DOB: [birth year], 80 y.o.   MRN: [id number]      80 y.o. with history of COPD, RUL nodule previous smoker. CAD with DES to the mid RCA in October 2015. Myovue done 06/05/19 was non ischemic and low risk  On statin for HLD. Needs f/u US abdomen 04/29/19 AAA 4.5 cm  Had MRI spine for back pain and noted AAA 4.9 cm   No chest pain Compliant with meds   05/13/18 had ureteroscopic procedure for nephrolithiasis. No issues holding plavix   Has had sinus infection most of January with malaise. 2 courses of antibiotics and prednisone  Had cataract surgery 03/31/20 on left eye  Chronic back pain with ? 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  All other systems reviewed and negative  General: BP 140/90   Pulse 68   Ht 5\' 9"  (1.753 m)   Wt 127 lb (57.6 kg)   SpO2 97%   BMI 18.75 kg/m  Affect appropriate Healthy:  appears stated age 46: normal Neck supple with no adenopathy JVP normal no bruits no thyromegaly Lungs clear with no wheezing and good diaphragmatic motion Heart:  S1/S2 no murmur, no rub, gallop or click PMI  normal Abdomen: benighn, BS positve, no tenderness, abdominal aorta palpable not tender  no bruit.  No HSM or HJR Distal pulses intact with no bruits No edema Neuro non-focal Skin warm and dry No muscular weakness  Current Outpatient Medications  Medication Sig Dispense Refill   amLODipine (NORVASC) 2.5 MG tablet Take 1 tablet (2.5 mg total) by mouth daily. 30 tablet 2   aspirin EC 81 MG EC tablet Take 1 tablet (81 mg total) by mouth daily at 6 (six) AM. Swallow whole.     atorvastatin (LIPITOR) 80 MG tablet Take 1 tablet (80 mg total) by mouth every morning. (Patient taking differently: Take 80 mg by mouth daily.) 90 tablet 3   brimonidine (ALPHAGAN P) 0.1 % SOLN Place 1 drop into both eyes 2 (two) times daily.     cholecalciferol (VITAMIN D3) 25 MCG (1000 UNIT) tablet Take 1,000 Units by mouth daily.     clopidogrel (PLAVIX) 75 MG tablet Take 1 tablet by mouth once daily 90 tablet 2   Coenzyme Q10 (CO Q 10) 100 MG CAPS Take 100 mg by mouth daily.     GARLIC PO Take 1 tablet by mouth daily.     HYDROcodone-acetaminophen (NORCO/VICODIN) 5-325 MG tablet Take 1 tablet by mouth every 6 (six) hours as needed for moderate pain. 12 tablet 0   latanoprost (XALATAN) 0.005 % ophthalmic solution Place 1 drop into  both eyes at bedtime.     metoprolol succinate (TOPROL-XL) 25 MG 24 hr tablet Take 0.5 tablets (12.5 mg total) by mouth daily. 15 tablet 2   Multiple Vitamin (MULTIVITAMIN) tablet Take 1 tablet by mouth daily.     Multiple Vitamins-Minerals (ICAPS AREDS FORMULA PO) Take 1 tablet by mouth 2 (two) times daily.     nitroGLYCERIN (NITROSTAT) 0.4 MG SL tablet Place 1 tablet (0.4 mg total) under the tongue every 5 (five) minutes as needed for chest pain. 25 tablet 12   OMEGA-3 KRILL OIL PO Take 200 mg by mouth daily.     penicillin v potassium (VEETID) 500 MG tablet Take 1 tablet by mouth in the morning, at noon, and at bedtime. For 7 days     PROAIR HFA 108 (90 Base) MCG/ACT inhaler Inhale 2  puffs into the lungs every 4 (four) hours as needed for wheezing.     pseudoephedrine (SUDAFED) 120 MG 12 hr tablet Take 120 mg by mouth daily as needed for congestion.     tiotropium (SPIRIVA) 18 MCG inhalation capsule Place 18 mcg into inhaler and inhale daily.     traZODone (DESYREL) 100 MG tablet Take 100 mg by mouth at bedtime.      predniSONE (STERAPRED UNI-PAK 48 TAB) 5 MG (48) TBPK tablet Take 5 mg by mouth as directed.     No current facility-administered medications for this visit.   Facility-Administered Medications Ordered in Other Visits  Medication Dose Route Frequency Provider Last Rate Last Admin   clindamycin (CLEOCIN) 900 mg in dextrose 5 % 50 mL IVPB  900 mg Intravenous 60 min Pre-Op Leighton Ruff, MD       And   gentamicin (GARAMYCIN) 290 mg in dextrose 5 % 50 mL IVPB  5 mg/kg Intravenous 60 min Pre-Op Leighton Ruff, MD        Allergies  Cefaclor, Nitrofurantoin, Chantix [varenicline], and Nsaids  Electrocardiogram:   03/02/2021 SR rate 103 PaC otherwise normal  Assessment and Plan  CAD: DES to RCA 2015  Continue medical Rx  Myovue done 06/05/19 normal no ischemia HR Better on Atenolol Ok to hold ASSA/Plavix for colectomy and proceed Can do 5 mets, TTE 12/28/20 shows normal EF 60-65% and no valve disease He has not had angina and myovue with no ischemia. Proceed with robotic hemi colectomy with Dr Marcello Moores Hold ASA/Plavix 5 days before surgery   AAA:  4.5 cm by last duplex 04/29/19 ? 4.9 cm on MRI done for back on 10/27/19 US done 04/29/20 Now 5.3 cm refer to Dr Carlis Abbott for possible stent graft intervention   HLD:  On statin labs with primary LDL 145 increase lipitor to 80 mg daily   COPD:  With RUL nodule 14 mm by CT 12/28/20 thought to be stable Discussed benefits fo not smoking even for 2 weeks before surgery    F/U 6 months  F/U VVS for endograft  F/U GI post op for colectomy  [name] MD [facility]

## 2021-03-09 DIAGNOSIS — D414 Neoplasm of uncertain behavior of bladder: Secondary | ICD-10-CM | POA: Diagnosis not present

## 2021-03-09 DIAGNOSIS — D49511 Neoplasm of unspecified behavior of right kidney: Secondary | ICD-10-CM | POA: Diagnosis not present

## 2021-03-09 DIAGNOSIS — N200 Calculus of kidney: Secondary | ICD-10-CM | POA: Diagnosis not present

## 2021-03-11 DIAGNOSIS — Z0100 Encounter for examination of eyes and vision without abnormal findings: Secondary | ICD-10-CM | POA: Diagnosis not present

## 2021-03-11 DIAGNOSIS — H401113 Primary open-angle glaucoma, right eye, severe stage: Secondary | ICD-10-CM | POA: Diagnosis not present

## 2021-03-11 DIAGNOSIS — H353133 Nonexudative age-related macular degeneration, bilateral, advanced atrophic without subfoveal involvement: Secondary | ICD-10-CM | POA: Diagnosis not present

## 2021-03-14 DIAGNOSIS — N200 Calculus of kidney: Secondary | ICD-10-CM | POA: Diagnosis not present

## 2021-03-14 DIAGNOSIS — R311 Benign essential microscopic hematuria: Secondary | ICD-10-CM | POA: Diagnosis not present

## 2021-03-14 DIAGNOSIS — N400 Enlarged prostate without lower urinary tract symptoms: Secondary | ICD-10-CM | POA: Diagnosis not present

## 2021-03-14 DIAGNOSIS — N130 Hydronephrosis with ureteropelvic junction obstruction: Secondary | ICD-10-CM | POA: Diagnosis not present

## 2021-03-14 DIAGNOSIS — Z8551 Personal history of malignant neoplasm of bladder: Secondary | ICD-10-CM | POA: Diagnosis not present

## 2021-03-14 DIAGNOSIS — R310 Gross hematuria: Secondary | ICD-10-CM | POA: Diagnosis not present

## 2021-03-17 DIAGNOSIS — R310 Gross hematuria: Secondary | ICD-10-CM | POA: Diagnosis not present

## 2021-03-17 DIAGNOSIS — R8271 Bacteriuria: Secondary | ICD-10-CM | POA: Diagnosis not present

## 2021-03-17 DIAGNOSIS — N3000 Acute cystitis without hematuria: Secondary | ICD-10-CM | POA: Diagnosis not present

## 2021-04-27 DIAGNOSIS — D414 Neoplasm of uncertain behavior of bladder: Secondary | ICD-10-CM | POA: Diagnosis not present

## 2021-04-27 DIAGNOSIS — N200 Calculus of kidney: Secondary | ICD-10-CM | POA: Diagnosis not present

## 2021-04-27 DIAGNOSIS — N3000 Acute cystitis without hematuria: Secondary | ICD-10-CM | POA: Diagnosis not present

## 2021-04-27 DIAGNOSIS — D49511 Neoplasm of unspecified behavior of right kidney: Secondary | ICD-10-CM | POA: Diagnosis not present

## 2021-05-06 DIAGNOSIS — H401113 Primary open-angle glaucoma, right eye, severe stage: Secondary | ICD-10-CM | POA: Diagnosis not present

## 2021-05-23 ENCOUNTER — Other Ambulatory Visit: Payer: Self-pay

## 2021-05-23 ENCOUNTER — Other Ambulatory Visit: Payer: Medicare PPO | Admitting: Hospice

## 2021-05-23 DIAGNOSIS — R634 Abnormal weight loss: Secondary | ICD-10-CM

## 2021-05-23 DIAGNOSIS — D494 Neoplasm of unspecified behavior of bladder: Secondary | ICD-10-CM

## 2021-05-23 DIAGNOSIS — J449 Chronic obstructive pulmonary disease, unspecified: Secondary | ICD-10-CM

## 2021-05-23 DIAGNOSIS — Z515 Encounter for palliative care: Secondary | ICD-10-CM

## 2021-05-23 NOTE — Progress Notes (Signed)
[facility] Consult Note Telephone: [phone]  Fax: [phone]  PATIENT NAME: Connor Cuevas DOB: 1940/04/10 MRN: [id number]  PRIMARY CARE PROVIDER:   [name], MD Connor Contras, MD [address] [place],  [place] [address]  REFERRING PROVIDER: [name], MD Connor Contras, MD [address] [place],  [place] [address]  RESPONSIBLE PARTY:   Self [phone] c Connor Cuevas is in [place]     Name Relation Home Work [place] M,Jr. Son Trinidad Spouse [phone]  [phone]      TELEHEALTH VISIT STATEMENT Due to the COVID-19 crisis, this visit was done via telemedicine from my office and it was initiated and consent by this patient and or family.  I connected with patient OR PROXY by a telephone/video  and verified that I am speaking with the correct person. I discussed the limitations of evaluation and management by telemedicine. The patient expressed understanding and agreed to proceed. Palliative Care was asked to follow this patient to address advance care planning, complex medical decision making and goals of care clarification.  Visit is to build trust and highlight Palliative Medicine as specialized medical care for people living with serious illness, aimed at facilitating better quality of life through symptoms relief, assisting with advance care planning and complex medical decision making.   RECOMMENDATIONS/PLAN:   CODE STATUS: Patient affirmed he is a full code   Goals of Care: Goals include to maximize quality of life and symptom management. Visit consisted of counseling and education dealing with the complex and emotionally intense issues of symptom management and palliative care in the setting of serious and potentially life-threatening illness.  Patient recently lost his wife Connor Cuevas;  Connor Cuevas from family and neighbors  helps patient to cope. He currently stays with his son in [place] and visits his home in [place] intermittently.   Palliative care team will continue to support patient, patient's family, and medical team.   Symptom Management/Plan: Weight loss: Current weight is 120 pounds down from 127 pounds November 2022.  Patient reports appetite is beginning to improve and he gets his protein mix shake twice daily.  Education on incorporating fruits and vegetables, healthy snacks in between meals.  Appointment with PCP 06/22/2021.  Routine CBC CMP thyroid panel. Bladder CA: ongoing surveillance from Oncology. Appointment with Oncology scheduled for next month.   COPD: No COPD exacerbation at this time.  Continue Spiriva, Albuterol.  Avoid irritants.  Slow deep breathing.  Use fan for air circulation. Follow up: Palliative care will continue to follow for complex medical decision making, advance care planning, and clarification of goals. Return 6 weeks or prn.Encouraged to call provider sooner with any concerns.    PPS: 60%   HOSPICE ELIGIBILITY/DIAGNOSIS: TBD   Chief Complaint: Follow-up visit   HISTORY OF PRESENT ILLNESS:  Connor Cuevas is a 81 y.o. year old male  with multiple medical conditions ongoing weight loss likely related to recent loss of spouse, and  bladder cancer with successful transurethral resection of bladder tumor 08/18/2020.  Patient reports bladder cancer now in remission, oncologist surveillance continues.  History of AAA repair, COPD, hypertension, kidney tumor, CAD.   He denies pain/discomfort.  Obtained from review of EMR, discussion with primary team, caregiver, family and/or Connor Cuevas.  Review and summarization of Epic records shows history from other than patient. Rest of 10 point ROS asked and  negative.   Review and summarization of Epic records shows history from other than patient.   Palliative Care was asked to follow this patient o help address complex decision making  in the context of advance care planning and goals of care clarification.   PPS: 60%   PERTINENT MEDICATIONS:  Outpatient Encounter Medications as of 05/23/2021  Medication Sig   amLODipine (NORVASC) 2.5 MG tablet Take 1 tablet (2.5 mg total) by mouth daily.   aspirin EC 81 MG EC tablet Take 1 tablet (81 mg total) by mouth daily at 6 (six) AM. Swallow whole.   atorvastatin (LIPITOR) 80 MG tablet Take 1 tablet (80 mg total) by mouth every morning. (Patient taking differently: Take 80 mg by mouth daily.)   brimonidine (ALPHAGAN P) 0.1 % SOLN Place 1 drop into both eyes 2 (two) times daily.   cholecalciferol (VITAMIN D3) 25 MCG (1000 UNIT) tablet Take 1,000 Units by mouth daily.   clopidogrel (PLAVIX) 75 MG tablet Take 1 tablet by mouth once daily   Coenzyme Q10 (CO Q 10) 100 MG CAPS Take 100 mg by mouth daily.   GARLIC PO Take 1 tablet by mouth daily.   HYDROcodone-acetaminophen (NORCO/VICODIN) 5-325 MG tablet Take 1 tablet by mouth every 6 (six) hours as needed for moderate pain.   latanoprost (XALATAN) 0.005 % ophthalmic solution Place 1 drop into both eyes at bedtime.   metoprolol succinate (TOPROL-XL) 25 MG 24 hr tablet Take 0.5 tablets (12.5 mg total) by mouth daily.   Multiple Vitamin (MULTIVITAMIN) tablet Take 1 tablet by mouth daily.   Multiple Vitamins-Minerals (ICAPS AREDS FORMULA PO) Take 1 tablet by mouth 2 (two) times daily.   nitroGLYCERIN (NITROSTAT) 0.4 MG SL tablet Place 1 tablet (0.4 mg total) under the tongue every 5 (five) minutes as needed for chest pain.   OMEGA-3 KRILL OIL PO Take 200 mg by mouth daily.   penicillin v potassium (VEETID) 500 MG tablet Take 1 tablet by mouth in the morning, at noon, and at bedtime. For 7 days   predniSONE (STERAPRED UNI-PAK 48 TAB) 5 MG (48) TBPK tablet Take 5 mg by mouth as directed.   PROAIR HFA 108 (90 Base) MCG/ACT inhaler Inhale 2 puffs into the lungs every 4 (four) hours as needed for wheezing.   pseudoephedrine (SUDAFED) 120 MG 12 hr  tablet Take 120 mg by mouth daily as needed for congestion.   tiotropium (SPIRIVA) 18 MCG inhalation capsule Place 18 mcg into inhaler and inhale daily.   traZODone (DESYREL) 100 MG tablet Take 100 mg by mouth at bedtime.    Facility-Administered Encounter Medications as of 05/23/2021  Medication   clindamycin (CLEOCIN) 900 mg in dextrose 5 % 50 mL IVPB   And   gentamicin (GARAMYCIN) 290 mg in dextrose 5 % 50 mL IVPB    HOSPICE ELIGIBILITY/DIAGNOSIS: TBD  PAST MEDICAL HISTORY:  Past Medical History:  Diagnosis Date   AAA (abdominal aortic aneurysm) 04/16/2018   4.3 cm, noted on [place]    Acute kidney failure ([facility]) 2005 approx   after ureteroscopy, hospitalized for about 10 days afterwards   Anxiety    Arthritis    Hands, wrist, elbow, back and spine oa   Bladder mass    Blind right eye since 07-2019   Bulging lumbar disc    L4-L5 degeneration, Chronic back pain   CAD (coronary artery disease)    a. 01/09/14: [place] s/p DES to RCA    COPD (chronic obstructive pulmonary disease) ([facility])  wheezing   Dyspnea    with exertion   Glaucoma of both eyes    Hepatic cyst 2013   Several tiny hepatic cysts , noted on CT   History of kidney stones    small kidney stones in right kidney   Hyperlipidemia    Interstitial lung disease ([facility])    managed by pcp dr Memory Dance swayne   Pneumonia yrs ago   Pulmonary nodule, right 08/02/2016   a. 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 Aortosclerosis, bilateral - mild with calcification. Per Dr. Moreen Fowler at [facility]    Wears dentures    upper    Wears glasses      ALLERGIES:  Allergies  Allergen Reactions   Cefaclor Anaphylaxis, Rash and Swelling    Throat closed   Nitrofurantoin Anaphylaxis    Right kidney shut down macrobid   Chantix [Varenicline] Other (See Comments)    nightmares    Nsaids Other (See Comments)    Due to blood thinner and asa use      I spent 46 minutes providing this consultation; this includes time spent with patient/family, chart review and documentation. More than 50% of the time in this consultation was spent on counseling and coordinating communication   Thank you for the opportunity to participate in the care of THEUS ESPIN Please call our office at [phone] if we can be of additional assistance.  Note: Portions of this note were generated with [employer]. Dictation errors may occur despite best attempts at proofreading.  Teodoro Spray, NP

## 2021-05-27 DIAGNOSIS — R399 Unspecified symptoms and signs involving the genitourinary system: Secondary | ICD-10-CM | POA: Diagnosis not present

## 2021-05-27 DIAGNOSIS — M1990 Unspecified osteoarthritis, unspecified site: Secondary | ICD-10-CM | POA: Diagnosis not present

## 2021-05-27 DIAGNOSIS — I25119 Atherosclerotic heart disease of native coronary artery with unspecified angina pectoris: Secondary | ICD-10-CM | POA: Diagnosis not present

## 2021-05-27 DIAGNOSIS — F419 Anxiety disorder, unspecified: Secondary | ICD-10-CM | POA: Diagnosis not present

## 2021-05-27 DIAGNOSIS — Z1389 Encounter for screening for other disorder: Secondary | ICD-10-CM | POA: Diagnosis not present

## 2021-05-27 DIAGNOSIS — Z0000 Encounter for general adult medical examination without abnormal findings: Secondary | ICD-10-CM | POA: Diagnosis not present

## 2021-05-27 DIAGNOSIS — E782 Mixed hyperlipidemia: Secondary | ICD-10-CM | POA: Diagnosis not present

## 2021-05-27 DIAGNOSIS — J449 Chronic obstructive pulmonary disease, unspecified: Secondary | ICD-10-CM | POA: Diagnosis not present

## 2021-05-27 DIAGNOSIS — I10 Essential (primary) hypertension: Secondary | ICD-10-CM | POA: Diagnosis not present

## 2021-06-20 DIAGNOSIS — N200 Calculus of kidney: Secondary | ICD-10-CM | POA: Diagnosis not present

## 2021-06-20 DIAGNOSIS — R300 Dysuria: Secondary | ICD-10-CM | POA: Diagnosis not present

## 2021-06-20 DIAGNOSIS — N2889 Other specified disorders of kidney and ureter: Secondary | ICD-10-CM | POA: Diagnosis not present

## 2021-06-20 DIAGNOSIS — C670 Malignant neoplasm of trigone of bladder: Secondary | ICD-10-CM | POA: Diagnosis not present

## 2021-06-27 DIAGNOSIS — C670 Malignant neoplasm of trigone of bladder: Secondary | ICD-10-CM | POA: Diagnosis not present

## 2021-06-28 DIAGNOSIS — D485 Neoplasm of uncertain behavior of skin: Secondary | ICD-10-CM | POA: Diagnosis not present

## 2021-06-28 DIAGNOSIS — C44329 Squamous cell carcinoma of skin of other parts of face: Secondary | ICD-10-CM | POA: Diagnosis not present

## 2021-06-28 DIAGNOSIS — Z23 Encounter for immunization: Secondary | ICD-10-CM | POA: Diagnosis not present

## 2021-07-04 ENCOUNTER — Other Ambulatory Visit: Payer: Medicare PPO | Admitting: Hospice

## 2021-07-04 DIAGNOSIS — F4321 Adjustment disorder with depressed mood: Secondary | ICD-10-CM

## 2021-07-04 DIAGNOSIS — D494 Neoplasm of unspecified behavior of bladder: Secondary | ICD-10-CM

## 2021-07-04 DIAGNOSIS — Z515 Encounter for palliative care: Secondary | ICD-10-CM

## 2021-07-04 DIAGNOSIS — R634 Abnormal weight loss: Secondary | ICD-10-CM

## 2021-07-04 DIAGNOSIS — J449 Chronic obstructive pulmonary disease, unspecified: Secondary | ICD-10-CM

## 2021-07-04 DIAGNOSIS — M159 Polyosteoarthritis, unspecified: Secondary | ICD-10-CM

## 2021-07-04 NOTE — Progress Notes (Signed)
? ? ?Manufacturing engineer ?Community Palliative Care Consult Note ?Telephone: [phone]  ?Fax: [phone] ? ?PATIENT NAME: Connor Cuevas ?DOB: [birth year] ?MRN: [id number] ? ?PRIMARY CARE PROVIDER:   [name], MD ?Connor Contras, MD ?[address] ?[address] ?[place],  [place] [address] ? ?REFERRING PROVIDER: [name], MD ?Connor Contras, MD ?[address] ?[address] ?[place],  [place] [address] ? ?RESPONSIBLE PARTY:   Self [phone] c ?[place] is in [place]  ?Contact Information   ? ? Name Relation Home Work Mobile  ? Vipond,Connor M,Jr. Son [phone]  [phone]  ? Connor, Cuevas Spouse [phone]  [phone]  ? ?  ? ?TELEHEALTH VISIT STATEMENT ?Due to the COVID-19 crisis, this visit was done via telemedicine from my office and it was initiated and consent by this patient and or family.  ?I connected with patient OR PROXY by a telephone/video  and verified that I am speaking with the correct person. I discussed the limitations of evaluation and management by telemedicine. The patient expressed understanding and agreed to proceed. ?Palliative Care was asked to follow this patient to address advance care planning, complex medical decision making and goals of care clarification.  ?Visit is to build trust and highlight Palliative Medicine as specialized medical care for people living with serious illness, aimed at facilitating better quality of life through symptoms relief, assisting with advance care planning and complex medical decision making.  ? ?RECOMMENDATIONS/PLAN:  ? ?CODE STATUS: Patient affirmed he is a full code ?  ?Goals of Care: Goals include to maximize quality of life and symptom management. ?Visit consisted of counseling and education dealing with the complex and emotionally intense issues of symptom management and palliative care in the setting of serious and potentially life-threatening illness.  ?Patient recently lost his wife Connor Cuevas and is grieving appropriately. Love  from family and neighbors helps patient to cope. He currently stays with his son in [place] and visits his home in [place] intermittently.   Palliative care team will continue to support patient, patient's family, and medical team. ?  ?Symptom Management/Plan: ?Grief: grieving appropriately.  Stays mostly with his son/family and this is helpful.  Patient in acceptance.  Encouraged ongoing support from family and friends. ?Osteoarthritis: Bilateral wrist.  Patient reports exercise and warm water is helpful.  Continue daily exercise, heat pad.  Take Celebrex as needed ordered as needed pain; patient not taking Celebrex every day. ?Weight loss: improved, Current weight is current/y 126 Ibs back to baseline.  Patient reports appetite is improving and he gets his protein mix occasionally, and Ensure daily.  Education on incorporating fruits and vegetables, healthy snacks in between meals.  ?Bladder CA: successful transurethral resection of bladder tumor 08/18/2020, ongoing surveillance from Urology Oncologist. Seen Urologist Feb 2023 with no issues, f/u in Nov 2023. ?COPD: No oxygen supplementation at this time. Patient reports 02 saturation in range of 97 - 98% room air. No COPD exacerbation at this time. Continue Spiriva daily, Albuterol prn SON; Albuterol not used in 3 months.  Avoid irritants.  Slow deep breathing.  Use fan for air circulation. ?Follow up: Palliative care will continue to follow for complex medical decision making, advance care planning, and clarification of goals. Return12 weeks or prn.Encouraged to call provider sooner with any concerns.  ?  ?PPS: 60% ?  ?HOSPICE ELIGIBILITY/DIAGNOSIS: TBD ?  ?Chief Complaint: Follow-up visit ?  ?HISTORY OF PRESENT ILLNESS:  Connor Cuevas is a 81 y.o. year old male  with multiple morbidities  requiring close monitoring/management with high risk of complications and morbidity: bladder cancer, COPD, hypertension, kidney tumor, CAD, Osteoarthritis,  Patient  reports bladder cancer now in remission, oncologist surveillance continues.  History of AAA repair,   He denies pain/discomfort, endorses appropriate grieving for loss of spouse last year.  History Obtained from review of EMR, discussion with primary team, caregiver, family and/or [name].  ?Review and summarization of Epic records shows history from other than patient. Rest of 10 point ROS asked and negative. ?  ?Review and summarization of Epic records shows history from other than patient.  ? ?Palliative Care was asked to follow this patient o help address complex decision making in the context of advance care planning and goals of care clarification.  ? ?PPS: 60% ? ? ?PERTINENT MEDICATIONS:  ?Outpatient Encounter Medications as of 07/04/2021  ?Medication Sig  ? amLODipine (NORVASC) 2.5 MG tablet Take 1 tablet (2.5 mg total) by mouth daily.  ? aspirin EC 81 MG EC tablet Take 1 tablet (81 mg total) by mouth daily at 6 (six) AM. Swallow whole.  ? atorvastatin (LIPITOR) 80 MG tablet Take 1 tablet (80 mg total) by mouth every morning. (Patient taking differently: Take 80 mg by mouth daily.)  ? brimonidine (ALPHAGAN P) 0.1 % SOLN Place 1 drop into both eyes 2 (two) times daily.  ? cholecalciferol (VITAMIN D3) 25 MCG (1000 UNIT) tablet Take 1,000 Units by mouth daily.  ? clopidogrel (PLAVIX) 75 MG tablet Take 1 tablet by mouth once daily  ? Coenzyme Q10 (CO Q 10) 100 MG CAPS Take 100 mg by mouth daily.  ? GARLIC PO Take 1 tablet by mouth daily.  ? HYDROcodone-acetaminophen (NORCO/VICODIN) 5-325 MG tablet Take 1 tablet by mouth every 6 (six) hours as needed for moderate pain.  ? latanoprost (XALATAN) 0.005 % ophthalmic solution Place 1 drop into both eyes at bedtime.  ? metoprolol succinate (TOPROL-XL) 25 MG 24 hr tablet Take 0.5 tablets (12.5 mg total) by mouth daily.  ? Multiple Vitamin (MULTIVITAMIN) tablet Take 1 tablet by mouth daily.  ? Multiple Vitamins-Minerals (ICAPS AREDS FORMULA PO) Take 1 tablet by mouth 2  (two) times daily.  ? nitroGLYCERIN (NITROSTAT) 0.4 MG SL tablet Place 1 tablet (0.4 mg total) under the tongue every 5 (five) minutes as needed for chest pain.  ? OMEGA-3 KRILL OIL PO Take 200 mg by mouth daily.  ? penicillin v potassium (VEETID) 500 MG tablet Take 1 tablet by mouth in the morning, at noon, and at bedtime. For 7 days  ? predniSONE (STERAPRED UNI-PAK 48 TAB) 5 MG (48) TBPK tablet Take 5 mg by mouth as directed.  ? PROAIR HFA 108 (90 Base) MCG/ACT inhaler Inhale 2 puffs into the lungs every 4 (four) hours as needed for wheezing.  ? pseudoephedrine (SUDAFED) 120 MG 12 hr tablet Take 120 mg by mouth daily as needed for congestion.  ? tiotropium (SPIRIVA) 18 MCG inhalation capsule Place 18 mcg into inhaler and inhale daily.  ? traZODone (DESYREL) 100 MG tablet Take 100 mg by mouth at bedtime.   ? ?Facility-Administered Encounter Medications as of 07/04/2021  ?Medication  ? clindamycin (CLEOCIN) 900 mg in dextrose 5 % 50 mL IVPB  ? And  ? gentamicin (GARAMYCIN) 290 mg in dextrose 5 % 50 mL IVPB  ? ? ?HOSPICE ELIGIBILITY/DIAGNOSIS: TBD ? ?PAST MEDICAL HISTORY:  ?Past Medical History:  ?Diagnosis Date  ? AAA (abdominal aortic aneurysm) 04/16/2018  ? 4.3 cm, noted on [place]   ? Acute  kidney failure ([place]) 2005 approx  ? after ureteroscopy, hospitalized for about 10 days afterwards  ? Anxiety   ? Arthritis   ? Hands, wrist, elbow, back and spine oa  ? Bladder mass   ? Blind right eye since 07-2019  ? Bulging lumbar disc   ? L4-L5 degeneration, Chronic back pain  ? CAD (coronary artery disease)   ? a. 01/09/14: [place] s/p DES to RCA   ? COPD (chronic obstructive pulmonary disease) ([place])   ? wheezing  ? Dyspnea   ? with exertion  ? Glaucoma of both eyes   ? Hepatic cyst 2013  ? Several tiny hepatic cysts , noted on CT  ? History of kidney stones   ? small kidney stones in right kidney  ? Hyperlipidemia   ? Interstitial lung disease ([place])   ? managed by pcp dr Memory Dance swayne  ? Pneumonia yrs ago  ? Pulmonary nodule, right  08/02/2016  ? a. Right upper lobe  ? Right ureteral stone   ? Seasonal allergies   ? 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  ? Stenosis of iliac artery (HCC)   ? a. Aortosclerosis

## 2021-07-21 DIAGNOSIS — D2261 Melanocytic nevi of right upper limb, including shoulder: Secondary | ICD-10-CM | POA: Diagnosis not present

## 2021-07-21 DIAGNOSIS — D239 Other benign neoplasm of skin, unspecified: Secondary | ICD-10-CM | POA: Diagnosis not present

## 2021-07-21 DIAGNOSIS — L570 Actinic keratosis: Secondary | ICD-10-CM | POA: Diagnosis not present

## 2021-07-21 DIAGNOSIS — L578 Other skin changes due to chronic exposure to nonionizing radiation: Secondary | ICD-10-CM | POA: Diagnosis not present

## 2021-07-21 DIAGNOSIS — C44320 Squamous cell carcinoma of skin of unspecified parts of face: Secondary | ICD-10-CM | POA: Diagnosis not present

## 2021-07-21 DIAGNOSIS — Z86018 Personal history of other benign neoplasm: Secondary | ICD-10-CM | POA: Diagnosis not present

## 2021-07-21 DIAGNOSIS — Z8589 Personal history of malignant neoplasm of other organs and systems: Secondary | ICD-10-CM | POA: Diagnosis not present

## 2021-07-21 DIAGNOSIS — D2272 Melanocytic nevi of left lower limb, including hip: Secondary | ICD-10-CM | POA: Diagnosis not present

## 2021-07-21 DIAGNOSIS — D2271 Melanocytic nevi of right lower limb, including hip: Secondary | ICD-10-CM | POA: Diagnosis not present

## 2021-08-16 DIAGNOSIS — C4492 Squamous cell carcinoma of skin, unspecified: Secondary | ICD-10-CM | POA: Diagnosis not present

## 2021-08-16 DIAGNOSIS — Z481 Encounter for planned postprocedural wound closure: Secondary | ICD-10-CM | POA: Diagnosis not present

## 2021-08-16 DIAGNOSIS — C44329 Squamous cell carcinoma of skin of other parts of face: Secondary | ICD-10-CM | POA: Diagnosis not present

## 2021-09-09 DIAGNOSIS — H401133 Primary open-angle glaucoma, bilateral, severe stage: Secondary | ICD-10-CM | POA: Diagnosis not present

## 2021-09-21 DIAGNOSIS — N200 Calculus of kidney: Secondary | ICD-10-CM | POA: Diagnosis not present

## 2021-09-21 DIAGNOSIS — R9341 Abnormal radiologic findings on diagnostic imaging of renal pelvis, ureter, or bladder: Secondary | ICD-10-CM | POA: Diagnosis not present

## 2021-09-21 DIAGNOSIS — C679 Malignant neoplasm of bladder, unspecified: Secondary | ICD-10-CM | POA: Diagnosis not present

## 2021-09-27 DIAGNOSIS — R5383 Other fatigue: Secondary | ICD-10-CM | POA: Diagnosis not present

## 2021-09-27 DIAGNOSIS — F419 Anxiety disorder, unspecified: Secondary | ICD-10-CM | POA: Diagnosis not present

## 2021-09-27 DIAGNOSIS — M1990 Unspecified osteoarthritis, unspecified site: Secondary | ICD-10-CM | POA: Diagnosis not present

## 2021-09-27 DIAGNOSIS — R350 Frequency of micturition: Secondary | ICD-10-CM | POA: Diagnosis not present

## 2021-09-29 ENCOUNTER — Telehealth: Payer: Self-pay

## 2021-09-29 NOTE — Telephone Encounter (Signed)
Patient called to cancel Palliative Care  servicing after discussing with Dr. Moreen Fowler. Will discharge. Patient advised to call back if services are needed in the future.

## 2021-10-03 ENCOUNTER — Other Ambulatory Visit: Payer: Medicare PPO | Admitting: Hospice

## 2021-10-17 DIAGNOSIS — N390 Urinary tract infection, site not specified: Secondary | ICD-10-CM | POA: Diagnosis not present

## 2021-11-19 NOTE — Progress Notes (Unsigned)
Cardiology Office Note:    Date:  11/21/2021   ID:  Connor Cuevas, DOB [birth year], MRN [id number]  PCP:  [name], MD   [facility] Providers Cardiologist:  [name], MD     Referring MD: [name], MD   Chief Complaint: 6 month follow-up CAD  History of Present Illness:    Connor Cuevas is a very pleasant 81 y.o. male with a hx of CAD with DES to mid RCA October 2015, COPD, RUL nodule, former tobacco abuse, HLD, AAA s/p aorta bi-iliac stent, prior infrarenal stent graft placement, malnutrition, and bladder cancer  Nuclear stress test 06/05/2019 low risk with no evidence of ischemia. MRI of abdomen 01/17/2021 revealed aneurysm sac measures 4.1 x 5.0 cm, grossly unchanged, followed by [facility].  Last seen in our office on 03/02/2021 by Dr. [name] at which time he was cleared for colectomy.  He was advised to return in 6 months for follow-up.  Today, he is here alone for 33-monthfollow-up.  He reports he is doing well and has no specific concerns today.  Reports he is walking frequently, going up and down stairs, and working to increase muscle strength and stay active. No claudication, no temperature or color change in lower extremities. He denies chest pain, shortness of breath, lower extremity edema, fatigue, palpitations, melena, hematuria, hemoptysis, diaphoresis, weakness, presyncope, syncope, orthopnea, and PND.  Past Medical History:  Diagnosis Date   AAA (abdominal aortic aneurysm) [place] 04/16/2018   4.3 cm, noted on [place]   Acute kidney failure ([facility] 2005 approx   after ureteroscopy, hospitalized for about 10 days afterwards   Anxiety    Arthritis    Hands, wrist, elbow, back and spine oa   Bladder mass    Blind right eye since 07-2019   Bulging lumbar disc    L4-L5 degeneration, Chronic back pain   CAD (coronary artery disease)    [place]    COPD (chronic obstructive pulmonary disease) ([facility]    wheezing   Dyspnea    with exertion    Glaucoma of both eyes    Hepatic cyst 2013   Several tiny hepatic cysts , noted on CT   History of kidney stones    small kidney stones in right kidney   Hyperlipidemia    Interstitial lung disease ([facility]    managed by pcp dr dMemory Danceswayne   Pneumonia yrs ago   Pulmonary nodule, right 08/02/2016   a. 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 Aortosclerosis, bilateral - mild with calcification. [facility] Dr[facility]    Wears dentures    upper    Wears glasses     Past Surgical History:  Procedure Laterality Date   ABDOMINAL AORTIC ENDOVASCULAR STENT GRAFT  06/21/2020   Procedure: ABDOMINAL AORTIC ENDOVASCULAR STENT GRAFT;  Surgeon: CMarty Heck MD;  [facility] [facility]  Service: Vascular;;   BIOPSY  01/18/2021   Procedure: BIOPSY;  Surgeon: SWilford Corner MD;  [facility];  Service: Endoscopy;;  EGD and COLON   CARDIAC CATHETERIZATION  2017   right carotid stent   CATARACT EXTRACTION W/ INTRAOCULAR LENS  IMPLANT, BILATERAL  1997 (APPROX)   CATARACT EXTRACTION W/PHACO Left 03/31/2020   Procedure: CATARACT EXTRACTION  PHACO AND INTRAOCULAR LENS PLACEMENT (IOC) LEFT 5.51 01:16.1 7.2%;  Surgeon: Leandrew Koyanagi, MD;  Location: [facility];  Service: Ophthalmology;  Laterality: Left;   COLONOSCOPY  2017   COLONOSCOPY WITH PROPOFOL N/A 01/18/2021   Procedure: COLONOSCOPY WITH PROPOFOL;  Surgeon: Wilford Corner, MD;  [facility];  Service: Endoscopy;  Laterality: N/A;   CYSTOSCOPY W/ URETERAL STENT PLACEMENT Bilateral 08/13/2020   Procedure: CYSTOSCOPY WITH BILATERAL RETROGRADE PYELOGRAM;  Surgeon: Janith Lima, MD;  Location[facility];  Service: Urology;  Laterality: Bilateral;   CYSTOSCOPY/RETROGRADE/URETEROSCOPY/STONE EXTRACTION WITH BASKET  10/17/2011   Procedure:  CYSTOSCOPY/RETROGRADE/URETEROSCOPY/STONE EXTRACTION WITH BASKET;  Surgeon: Hanley Ben, MD;  Location: [facility];  Service: Urology;  Laterality: Right;   CYSTOSCOPY/URETEROSCOPY/HOLMIUM LASER/STENT PLACEMENT Right 05/13/2018   Procedure: CYSTOSCOPY/RETROGRADE/URETEROSCOPY/HOLMIUM LASER;  Surgeon: Kathie Rhodes, MD;  [facility];  Service: Urology;  Laterality: Right;   ESOPHAGOGASTRODUODENOSCOPY (EGD) WITH PROPOFOL N/A 01/18/2021   Procedure: ESOPHAGOGASTRODUODENOSCOPY (EGD) WITH PROPOFOL;  Surgeon: Wilford Corner, MD;  [facility];  Service: Endoscopy;  Laterality: N/A;   HERNIA REPAIR  15 yrs ago   bil inguinal   LAPAROSCOPIC LEFT INGUINAL (OBTURATOR) HERNIA AND UMBILICAL HERNIA REPAIR  01-13-2011   LEFT HEART CATHETERIZATION WITH CORONARY ANGIOGRAM N/A 01/09/2014   Procedure: LEFT HEART CATHETERIZATION WITH CORONARY ANGIOGRAM;  Surgeon: Josue Hector, MD;  Location: [facility];  Service: Cardiovascular;  Laterality: N/A;   LEFT URETEROSCOPIC STONE EXTRACTION  11-30-2000   W/ PREVIOUS ESWL AND URETERAL STENT PLACEMENT   PHOTOCOAGULATION Right 12/10/2019   Procedure: TRANSCLEARAL DIODE CYCLOPHOTOCOAGULATION RIGHT;  Surgeon: Leandrew Koyanagi, MD;  Location: [facility];  Service: Ophthalmology;  Laterality: Right;   REPAIR RIGHT INGUINAL HERNIA W/ MESH  12-31-2000   SUBMUCOSAL TATTOO INJECTION  01/18/2021   Procedure: SUBMUCOSAL TATTOO INJECTION;  Surgeon: Wilford Corner, MD;  [facility];  Service: Endoscopy;;   TONSILLECTOMY  age 62   TRANSURETHRAL RESECTION OF BLADDER TUMOR N/A 08/13/2020   Procedure: TRANSURETHRAL RESECTION OF BLADDER TUMOR (TURBT) WITH POSTOPERATIVE INSTILLATION OF GEMCITABINE;  Surgeon: Janith Lima, MD;  Location: [facility];  Service: Urology;  Laterality: N/A;  GENERAL ANESTHESIA WITH INTUBATED, PARALYSIS   ULTRASOUND GUIDANCE FOR VASCULAR ACCESS Bilateral 06/21/2020    Procedure: ULTRASOUND GUIDANCE FOR VASCULAR ACCESS;  Surgeon: Marty Heck, MD;  Location: [address];  Service: Vascular;  Laterality: Bilateral;   URETEROLITHOTOMY  2009    Current Medications: Current Meds  Medication Sig   amLODipine (NORVASC) 2.5 MG tablet Take 1 tablet (2.5 mg total) by mouth daily.   aspirin EC 81 MG EC tablet Take 1 tablet (81 mg total) by mouth daily at 6 (six) AM. Swallow whole.   atorvastatin (LIPITOR) 80 MG tablet Take 1 tablet (80 mg total) by mouth every morning.   brimonidine (ALPHAGAN P) 0.1 % SOLN Place 1 drop into both eyes 2 (two) times daily.   cholecalciferol (VITAMIN D3) 25 MCG (1000 UNIT) tablet Take 1,000 Units by mouth daily.   clopidogrel (PLAVIX) 75 MG tablet Take 1 tablet by mouth once daily   Coenzyme Q10 (CO Q 10) 100 MG CAPS Take 100 mg by mouth daily.   GARLIC PO Take 1 tablet by mouth daily.   HYDROcodone-acetaminophen (NORCO/VICODIN) 5-325 MG tablet Take 1 tablet by mouth every 6 (six) hours as needed for moderate pain.   latanoprost (XALATAN) 0.005 % ophthalmic solution Place 1 drop into both eyes at bedtime.   Multiple Vitamin (  MULTIVITAMIN) tablet Take 1 tablet by mouth daily.   Multiple Vitamins-Minerals (ICAPS AREDS FORMULA PO) Take 1 tablet by mouth 2 (two) times daily.   OMEGA-3 KRILL OIL PO Take 200 mg by mouth daily.   PROAIR HFA 108 (90 Base) MCG/ACT inhaler Inhale 2 puffs into the lungs every 4 (four) hours as needed for wheezing.   pseudoephedrine (SUDAFED) 120 MG 12 hr tablet Take 120 mg by mouth daily as needed for congestion.   tiotropium (SPIRIVA) 18 MCG inhalation capsule Place 18 mcg into inhaler and inhale daily.   traZODone (DESYREL) 100 MG tablet Take 100 mg by mouth at bedtime.    [DISCONTINUED] metoprolol succinate (TOPROL-XL) 25 MG 24 hr tablet Take 0.5 tablets (12.5 mg total) by mouth daily.   [DISCONTINUED] nitroGLYCERIN (NITROSTAT) 0.4 MG SL tablet Place 1 tablet (0.4 mg total) under the tongue every 5 (five)  minutes as needed for chest pain.     Allergies:   Cefaclor, Nitrofurantoin, Chantix [varenicline], and Nsaids   Social History   Socioeconomic History   Marital status: Married    Spouse name: Not on file   Number of children: Not on file   Years of education: Not on file   Highest education level: Not on file  Occupational History   Not on file  Tobacco Use   Smoking status: Former    Packs/day: 1.00    Years: 53.00    Total pack years: 53.00    Types: Cigarettes    Quit date: 03/11/2018    Years since quitting: 3.7   Smokeless tobacco: Never  Vaping Use   Vaping Use: Never used  Substance and Sexual Activity   Alcohol use: No   Drug use: Never   Sexual activity: Yes  Other Topics Concern   Not on file  Social History Narrative   Not on file   Social Determinants of Health   Financial Resource Strain: Not on file  Food Insecurity: Not on file  Transportation Needs: Not on file  Physical Activity: Not on file  Stress: Not on file  Social Connections: Not on file     Family History: The patient's family history includes Heart attack in an other family member; Hypertension in his mother; Stroke in his maternal grandfather, maternal grandmother, paternal grandfather, paternal grandmother, and another family member.  ROS:   Please see the history of present illness.   All other systems reviewed and are negative.  Labs/Other Studies Reviewed:    The following studies were reviewed today:  MR Abdomen 01/17/21  IMPRESSION: 15 mm enhancing lesion in the lateral right upper kidney, compatible with solid renal neoplasm, grossly unchanged.   Stable 4.1 x 5.0 cm abdominal aortic aneurysm status post aorta bi-iliac stent.   Echo 12/28/20  1. Left ventricular ejection fraction, by estimation, is 60 to 65%. The  left ventricle has normal function. The left ventricle has no regional  wall motion abnormalities. Left ventricular diastolic parameters are  consistent  with Grade I diastolic  dysfunction (impaired relaxation).   2. Right ventricular systolic function is normal. The right ventricular  size is normal.   3. The mitral valve is normal in structure. No evidence of mitral valve  regurgitation. No evidence of mitral stenosis.   4. The aortic valve is normal in structure. Aortic valve regurgitation is  not visualized. No aortic stenosis is present.   5. The inferior vena cava is normal in size with greater than 50%  respiratory variability, suggesting right atrial  pressure of 3 mmHg.    Lexiscan myoview 06/05/19  Lexiscan stress without ST T Wave changes Myovue scan with normal perfusion, minimal apical thinning LVEF calculated at 49% though visual estimate suggests LVEF is greater This is a low risk study.  Recent Labs: 12/28/2020: BUN 20; Creatinine, Ser 1.04; Potassium 4.2; Sodium 137 12/29/2020: Hemoglobin 14.5; Platelets 259  Recent Lipid Panel    Component Value Date/Time   CHOL 215 (H) 12/29/2020 0030   CHOL 194 08/02/2020 0837   TRIG 84 12/29/2020 0030   HDL 43 12/29/2020 0030   HDL 52 08/02/2020 0837   CHOLHDL 5.0 12/29/2020 0030   VLDL 17 12/29/2020 0030   LDLCALC 155 (H) 12/29/2020 0030   LDLCALC 114 (H) 08/02/2020 0837     Risk Assessment/Calculations:      Physical Exam:    VS:  BP 124/70 (BP Location: Left Arm, Patient Position: Sitting, Cuff Size: Normal)   Pulse 84   Ht '5\' 9"'$  (1.753 m)   Wt 127 lb (57.6 kg)   SpO2 98%   BMI 18.75 kg/m     Wt Readings from Last 3 Encounters:  11/21/21 127 lb (57.6 kg)  03/02/21 127 lb (57.6 kg)  02/15/21 126 lb 3.2 oz (57.2 kg)     GEN: Thin, well developed male in no acute distress HEENT: Normal NECK: No JVD; No carotid bruits CARDIAC: RRR, no murmurs, rubs, gallops RESPIRATORY:  Clear to auscultation without rales, wheezing or rhonchi  ABDOMEN: Soft, non-tender, non-distended MUSCULOSKELETAL:  No edema; No deformity. 2+ pedal pulses, equal bilaterally SKIN: Warm  and dry NEUROLOGIC:  Alert and oriented x 3 PSYCHIATRIC:  Normal affect   EKG:  EKG is not ordered today.   Diagnoses:    1. Coronary artery disease involving native coronary artery of native heart without angina pectoris   2. Hyperlipidemia LDL goal <70   3. Abdominal aortic aneurysm (AAA) without rupture, unspecified part ([place])   4. Essential hypertension    Assessment and Plan:     CAD without angina: History of DES to RCA 2015. Normal NST 06/2019. He denies chest pain, dyspnea, or other symptoms concerning for angina.  No indication for further ischemic evaluation at this time. History of bladder cancer, no bleeding concerns. Continue metoprolol, clopidogrel, amlodipine, atorvastatin, aspirin.   Hypertension: BP is well-controlled. Has been taking metoprolol 12.5 mg in addition to atenolol 25 mg by mistake. Have asked him to monitor BP and notify us if it is elevated > 130/80 on metoprolol alone. Would favor increasing metoprolol to 25 mg daily if BP is elevated.   Hyperlipidemia LDL goal < 70: LDL 86 on 05/27/21, checked by PCP. Advised LDL goal < 70. Advised him to take atorvastatin 80 mg with heaviest meal of the day. He is scheduled for repeat lab work in the next few months with PCP. Would favor addition of ezetimibe if LDL remains above goal.   AAA: s/p EVAR for 5.9 cm AAA on 06/21/20. No claudication, temperature or skin change of LE. He remains physically active. Advised to return for follow-up with [facility] 02/2022.   Disposition: 6 months with Dr. [name]    Medication Adjustments/Labs and Tests Ordered: Current medicines are reviewed at length with the patient today.  Concerns regarding medicines are outlined above.  No orders of the defined types were placed in this encounter.  Meds ordered this encounter  Medications   nitroGLYCERIN (NITROSTAT) 0.4 MG SL tablet    Sig: Place 1 tablet (0.4 mg total)  under the tongue every 5 (five) minutes as needed for chest pain.     Dispense:  25 tablet    Refill:  5   metoprolol succinate (TOPROL-XL) 25 MG 24 hr tablet    Sig: Take 0.5 tablets (12.5 mg total) by mouth daily.    Dispense:  45 tablet    Refill:  3    Patient Instructions  Medication Instructions:  Your physician recommends that you continue on your current medications as directed. Please refer to the Current Medication list given to you today.  *If you need a refill on your cardiac medications before your next appointment, please call your pharmacy*   Lab Work: NONE If you have labs (blood work) drawn today and your tests are completely normal, you will receive your results only by: [place] (if you have MyChart) OR A paper copy in the mail If you have any lab test that is abnormal or we need to change your treatment, we will call you to review the results.   Testing/Procedures: NONE   Follow-Up: At [facility], you and your health needs are our priority.  As part of our continuing mission to provide you with exceptional heart care, we have created designated Provider Care Teams.  These Care Teams include your primary Cardiologist (physician) and Advanced Practice Providers (APPs -  Physician Assistants and Nurse Practitioners) who all work together to provide you with the care you need, when you need it.  We recommend signing up for the patient portal called "MyChart".  Sign up information is provided on this After Visit Summary.  MyChart is used to connect with patients for Virtual Visits (Telemedicine).  Patients are able to view lab/test results, encounter notes, upcoming appointments, etc.  Non-urgent messages can be sent to your provider as well.   To learn more about what you can do with MyChart, go to [email or web address].    Your next appointment:   6 month(s)  The format for your next appointment:   In Person  Provider:   [name], MD {  Important Information About Sugar         Signed, Emmaline Life, NP  11/21/2021 10:41 AM    [place]

## 2021-11-21 ENCOUNTER — Ambulatory Visit: Payer: Medicare PPO | Admitting: Nurse Practitioner

## 2021-11-21 ENCOUNTER — Encounter: Payer: Self-pay | Admitting: Nurse Practitioner

## 2021-11-21 VITALS — BP 124/70 | HR 84 | Ht 69.0 in | Wt 127.0 lb

## 2021-11-21 DIAGNOSIS — I714 Abdominal aortic aneurysm, without rupture, unspecified: Secondary | ICD-10-CM

## 2021-11-21 DIAGNOSIS — I2510 Atherosclerotic heart disease of native coronary artery without angina pectoris: Secondary | ICD-10-CM

## 2021-11-21 DIAGNOSIS — E785 Hyperlipidemia, unspecified: Secondary | ICD-10-CM

## 2021-11-21 DIAGNOSIS — I10 Essential (primary) hypertension: Secondary | ICD-10-CM

## 2021-11-21 MED ORDER — NITROGLYCERIN 0.4 MG SL SUBL: 0.4000 mg | SUBLINGUAL_TABLET | SUBLINGUAL | 5 refills | Status: AC | PRN

## 2021-11-21 MED ORDER — METOPROLOL SUCCINATE ER 25 MG PO TB24: 12.5000 mg | ORAL_TABLET | Freq: Every day | ORAL | 3 refills | Status: AC

## 2021-11-21 NOTE — Patient Instructions (Signed)
Medication Instructions:  Your physician recommends that you continue on your current medications as directed. Please refer to the Current Medication list given to you today.  *If you need a refill on your cardiac medications before your next appointment, please call your pharmacy*   Lab Work: NONE If you have labs (blood work) drawn today and your tests are completely normal, you will receive your results only by: [place] (if you have MyChart) OR A paper copy in the mail If you have any lab test that is abnormal or we need to change your treatment, we will call you to review the results.   Testing/Procedures: NONE   Follow-Up: At [facility], you and your health needs are our priority.  As part of our continuing mission to provide you with exceptional heart care, we have created designated Provider Care Teams.  These Care Teams include your primary Cardiologist (physician) and Advanced Practice Providers (APPs -  Physician Assistants and Nurse Practitioners) who all work together to provide you with the care you need, when you need it.  We recommend signing up for the patient portal called "MyChart".  Sign up information is provided on this After Visit Summary.  MyChart is used to connect with patients for Virtual Visits (Telemedicine).  Patients are able to view lab/test results, encounter notes, upcoming appointments, etc.  Non-urgent messages can be sent to your provider as well.   To learn more about what you can do with MyChart, go to [email or web address].    Your next appointment:   6 month(s)  The format for your next appointment:   In Person  Provider:   [name], MD {  Important Information About [place]

## 2021-11-25 DIAGNOSIS — I10 Essential (primary) hypertension: Secondary | ICD-10-CM | POA: Diagnosis not present

## 2021-11-25 DIAGNOSIS — I25119 Atherosclerotic heart disease of native coronary artery with unspecified angina pectoris: Secondary | ICD-10-CM | POA: Diagnosis not present

## 2021-11-25 DIAGNOSIS — F419 Anxiety disorder, unspecified: Secondary | ICD-10-CM | POA: Diagnosis not present

## 2021-11-25 DIAGNOSIS — E782 Mixed hyperlipidemia: Secondary | ICD-10-CM | POA: Diagnosis not present

## 2021-11-25 DIAGNOSIS — I700 Atherosclerosis of aorta: Secondary | ICD-10-CM | POA: Diagnosis not present

## 2021-11-25 DIAGNOSIS — M1990 Unspecified osteoarthritis, unspecified site: Secondary | ICD-10-CM | POA: Diagnosis not present

## 2021-11-25 DIAGNOSIS — J449 Chronic obstructive pulmonary disease, unspecified: Secondary | ICD-10-CM | POA: Diagnosis not present

## 2021-11-25 DIAGNOSIS — N2889 Other specified disorders of kidney and ureter: Secondary | ICD-10-CM | POA: Diagnosis not present

## 2021-11-25 DIAGNOSIS — R399 Unspecified symptoms and signs involving the genitourinary system: Secondary | ICD-10-CM | POA: Diagnosis not present

## 2021-12-01 DIAGNOSIS — N390 Urinary tract infection, site not specified: Secondary | ICD-10-CM | POA: Diagnosis not present

## 2021-12-01 DIAGNOSIS — R300 Dysuria: Secondary | ICD-10-CM | POA: Diagnosis not present

## 2022-01-02 DIAGNOSIS — N390 Urinary tract infection, site not specified: Secondary | ICD-10-CM | POA: Diagnosis not present

## 2022-01-02 DIAGNOSIS — R300 Dysuria: Secondary | ICD-10-CM | POA: Diagnosis not present

## 2022-01-07 DIAGNOSIS — R7402 Elevation of levels of lactic acid dehydrogenase (LDH): Secondary | ICD-10-CM | POA: Diagnosis not present

## 2022-01-07 DIAGNOSIS — J441 Chronic obstructive pulmonary disease with (acute) exacerbation: Secondary | ICD-10-CM | POA: Diagnosis not present

## 2022-01-07 DIAGNOSIS — R509 Fever, unspecified: Secondary | ICD-10-CM | POA: Diagnosis not present

## 2022-01-07 DIAGNOSIS — R7989 Other specified abnormal findings of blood chemistry: Secondary | ICD-10-CM | POA: Diagnosis not present

## 2022-01-07 DIAGNOSIS — D72823 Leukemoid reaction: Secondary | ICD-10-CM | POA: Diagnosis not present

## 2022-01-07 DIAGNOSIS — N390 Urinary tract infection, site not specified: Secondary | ICD-10-CM | POA: Diagnosis not present

## 2022-01-07 DIAGNOSIS — R069 Unspecified abnormalities of breathing: Secondary | ICD-10-CM | POA: Diagnosis not present

## 2022-01-07 DIAGNOSIS — Z8551 Personal history of malignant neoplasm of bladder: Secondary | ICD-10-CM | POA: Diagnosis not present

## 2022-01-07 DIAGNOSIS — I10 Essential (primary) hypertension: Secondary | ICD-10-CM | POA: Diagnosis not present

## 2022-01-07 DIAGNOSIS — B952 Enterococcus as the cause of diseases classified elsewhere: Secondary | ICD-10-CM | POA: Diagnosis not present

## 2022-01-07 DIAGNOSIS — Z20822 Contact with and (suspected) exposure to covid-19: Secondary | ICD-10-CM | POA: Diagnosis not present

## 2022-01-07 DIAGNOSIS — R0602 Shortness of breath: Secondary | ICD-10-CM | POA: Diagnosis not present

## 2022-01-07 DIAGNOSIS — Z1152 Encounter for screening for COVID-19: Secondary | ICD-10-CM | POA: Diagnosis not present

## 2022-01-07 DIAGNOSIS — A419 Sepsis, unspecified organism: Secondary | ICD-10-CM | POA: Diagnosis not present

## 2022-01-07 DIAGNOSIS — N200 Calculus of kidney: Secondary | ICD-10-CM | POA: Diagnosis not present

## 2022-01-07 DIAGNOSIS — T886XXA Anaphylactic reaction due to adverse effect of correct drug or medicament properly administered, initial encounter: Secondary | ICD-10-CM | POA: Diagnosis not present

## 2022-01-07 DIAGNOSIS — I739 Peripheral vascular disease, unspecified: Secondary | ICD-10-CM | POA: Diagnosis not present

## 2022-01-07 DIAGNOSIS — R0689 Other abnormalities of breathing: Secondary | ICD-10-CM | POA: Diagnosis not present

## 2022-01-07 DIAGNOSIS — E872 Acidosis, unspecified: Secondary | ICD-10-CM | POA: Diagnosis not present

## 2022-01-07 DIAGNOSIS — D72829 Elevated white blood cell count, unspecified: Secondary | ICD-10-CM | POA: Diagnosis not present

## 2022-01-09 NOTE — Discharge Summary (Signed)
 Discharge Summary  Date of Admission: 01/07/2022 Name:  Connor Cuevas  Date of Discharge:  01/09/2022 DOB:  05/27/1940 Discharging Team:  Hospitalist Medicine MRN:  [id number] Consultants:  Infectious Diseases, Urology Primary Care Physician: Alm Elsie Rav, MD  Discharge Diagnoses:    COPD with acute exacerbation (CMS/HCC)   Complicated UTI (urinary tract infection)   Allergic reaction to drug      Discharge Medications     .    albuterol  90 mcg/actuation inhaler Inhale 2 puffs every 6 (six) hours as needed for wheezing or shortness of breath. Commonly known as: VENTOLIN  HFA, PROVENTIL  HFA   amLODIPine  2.5 MG tablet Take 1 tablet (2.5 mg total) by mouth daily. Commonly known as: NORVASC    amoxicillin-clavulanate 875-125 mg per tablet Take 1 tablet by mouth 2 (two) times a day for 7 days. Commonly known as: AUGMENTIN   aspirin  81 MG tablet Take 1 tablet (81 mg total) by mouth daily.   atorvastatin  80 MG tablet Take 1 tablet (80 mg total) by mouth daily. Commonly known as: LIPITOR    brimonidine  0.1 % Drop Administer 1 drop into both eyes 3 (three) times a day. Commonly known as: ALPHAGAN  P   cholecalciferol  (vitamin D3) 1,000 unit (25 mcg) tablet Take 1 tablet (1,000 Units total) by mouth every evening.   clopidogreL  75 mg tablet Take 1 tablet (75 mg total) by mouth daily. Commonly known as: PLAVIX    Co Q-10 100 mg capsule Take 1 capsule (100 mg total) by mouth every evening. Generic drug: coenzyme Q10   dorzolamide -timoloL  22.3-6.8 mg/mL ophthalmic solution Administer 1 drop into both eyes 3 (three) times a day. Commonly known as: COSOPT    Fish OiL 1,000 mg (120 mg-180 mg) Cap Take 1 capsule by mouth daily. Generic drug: omega 3-dha-epa-fish oil   GARLIC ORAL Take 3 mg by mouth daily.   guaiFENesin  600 mg 12 hr tablet Take 1 tablet (600 mg total) by mouth 2 (two) times a day as needed for cough or congestion. Commonly known as: MUCINEX     HYDROcodone -acetaminophen  5-325 mg per tablet Take 1 tablet by mouth every 8 (eight) hours as needed. Commonly known as: NORCO   ICAPS ORAL Take 1 capsule by mouth 2 (two) times a day.   latanoprost  0.005 % ophthalmic solution Administer 1 drop into both eyes nightly. Commonly known as: XALATAN    metoprolol  succinate 25 MG 24 hr tablet Take 0.5 tablets (12.5 mg total) by mouth daily. Commonly known as: TOPROL -XL   MULTIVITAMIN ORAL Take 1 tablet by mouth daily.   nitroglycerin  0.4 MG SL tablet Place 1 tablet (0.4 mg total) under the tongue every 5 (five) minutes as needed for chest pain. Max 3 doses Commonly known as: NITROSTAT    prednisone  10 MG tablet Take 4 tablets (40 mg total) by mouth daily for 1 day, THEN 3 tablets (30 mg total) daily for 1 day, THEN 2 tablets (20 mg total) daily for 1 day, THEN 1 tablet (10 mg total) daily for 1 day, THEN 0.5 tablets (5 mg total) daily for 1 day. Commonly known as: DELTASONE  Start taking on: January 09, 2022   tiotropium 18 mcg inhalation capsule Place 1 capsule (18 mcg total) into inhaler and inhale daily. Commonly known as: SPIRIVA  HANDIHALER   trazodone  100 MG tablet Take 1 tablet (100 mg total) by mouth nightly. Commonly known as: DESYREL        Other Discharge Instructions:  Disposition:  Home Follow Up: With PCP and primary urology in 1 to  2 weeks Activity:  As tolerated Diet:  Regular  Results:   Last Weight:  61.4 kg (135 lb 5.8 oz) Lab Results  Component Value Date   WBC 18.6 (H) 01/09/2022   HGB 12.5 01/09/2022   HCT 38 01/09/2022   PLT 231 01/09/2022   INR 1.09 01/07/2022   NA 143 01/09/2022   K 4.0 01/09/2022   CL 113 (H) 01/09/2022   CO2 23 01/09/2022   BUN 27 (H) 01/09/2022   CREAT 0.84 01/09/2022   GLU 122 01/09/2022   CA 8.5 (L) 01/09/2022   MG 1.9 01/08/2022   ALB 3.6 01/09/2022   TP 5.7 (L) 01/09/2022   ALT 18 01/09/2022   AST 31 01/09/2022   BILIT 0.7 01/09/2022   UWBC >50 (A) 01/07/2022    URBC >50 (A) 01/07/2022   UBAC 1+ (A) 01/07/2022   ULEU SMALL (A) 01/07/2022   UNIT NEGATIVE 01/07/2022   Unresulted Labs     None      Radiology:  XR Chest 2 Views  Result Date: 01/07/2022 IMPRESSION: Hyperinflation. No focal consolidation or edema.  ECG 12 lead;  Result Date: 01/07/2022 Sinus tachycardia Septal infarct , age undetermined Abnormal ECG No previous ECGs available Confirmed by GLENDIA CARRIER E. ([id number]) on 01/07/2022 6:30:52 AM  CT Abdomen Pelvis Renal Colic Without IV Contrast  Result Date: 01/07/2022 IMPRESSION: 1.  Right nephrolithiasis. No ureterolithiasis or obstructive uropathy. 2.  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 Excluded aneurysm sac measures 3.8 cm.  History of Present Illness:  This is a pleasant 81 year old male with a history of CAD status post intervention in 2015 to the RCA, COPD, abdominal aortic aneurysm status postrepair in 2022, hypertension, dyslipidemia, glaucoma with right eye blindness and bladder cancer status post tumor resection in May 2022.  He states since January of this year he has had issues with recurrent urinary tract infections.  He estimates that he has been on 7 courses of antibiotics.  Most recently he was placed on levofloxacin  on October 2 by his urologist.  He was notified of abnormal urine culture results growing E. coli and another species of organism that appeared to be drug-resistant and he was prescribed nitrofurantoin.  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   After receiving the new prescription for nitrofurantoin he was not aware that this was the same as Macrobid.  He took a dose of medication this evening.  Approximately 3-1/2 hours later he developed several loose stools and then gradually worsening shortness of breath.  Patient reports having a sensation that his throat was closing and felt like it was having difficulty drinking water.  His breathing  became more labored.  EMS was called.  He received Solu-Medrol  and DuoNeb treatments.  He continued to be wheezing on arrival to the emergency room and received an additional DuoNeb treatment.  In addition he was given a Junior EpiPen .  Overall the patient reports improvement in his symptoms but still feels that he is less than 50% of his normal baseline.   Additional work-up in the emergency room reveals findings consistent with urinary tract infection.  The patient has been administered cefepime along with normal saline at 30 cc/kg for sepsis protocol and is referred for admission.    Hospital Course:      81 y.o. male with history of recurrent urinary tract infections unfortunately has a anaphylaxis reaction to nitrofurantoin developed diarrhea and increasing respiratory distress.    1.  COPD exacerbation.  possible this was triggered by allergic reaction to the drug.  With clinical improvement IV steroid changed to oral tapering dose of prednisone  on discharge.  Elevated WBC from steroid use.  Clinically improved.  Continue home inhalers on discharge.   2.  Allergic reaction to nitrofurantoin.  Status post Solu-Medrol  and EpiPen .  Patient reports improvement in symptoms.  Clinically doing well.   3.  Complicated urinary tract infection with sepsis. Sepsis valid on this admission and presented on admission. Patient with the tachycardia, elevated white blood cell count , lactic acidosis and UTI.  It is possible this may be related to the allergic reaction as well.  He has received fluids per sepsis protocol.  Enterococcus noted in urine.  Enterococcus sensitive to penicillin at urology office.  IV Zosyn changed to oral Augmentin after discussion with infectious disease team.  Patient provided 1 week of Augmentin on discharge.  Advised to follow-up with urology team for definitive stone treatment.  Patient and family is in agreement.   4.  Coronary artery disease peripheral vascular disease.   Continue aspirin  81 mg, clopidogrel  75 mg, atorvastatin  80 mg, metoprolol  XL 12.5 mg daily.   5.  Hypertension.  Continue amlodipine  and metoprolol .   6.  Glaucoma and right eye blindness since 2021.  Continue current eyedrop regimen.  With clinical improvement patient wants to go home.  Clinically stable to discharge home with outpatient follow-up.  Return to ER instruction given.  Advised to take antibiotic with food.  Monitor for antibiotic side effects.  Eat yogurt while on antibiotics.  Exam today: Gen: NAD Lungs: CTAB Abd: soft, NT  The patient was instructed on indications for seeking medical attention.  After physical examination and a review of our findings and recommendations, the patient was cleared for discharge.  I spent more than 30 minutes in the discharge of this patient.  Olga Cameron Blanch, MD  Portions of this record may have been dictated using voice recognition software.  Unintentional errors in spelling and vocabulary are possible and may sometimes remain uncorrected.

## 2022-01-16 DIAGNOSIS — C670 Malignant neoplasm of trigone of bladder: Secondary | ICD-10-CM | POA: Diagnosis not present

## 2022-01-16 DIAGNOSIS — R8289 Other abnormal findings on cytological and histological examination of urine: Secondary | ICD-10-CM | POA: Diagnosis not present

## 2022-01-16 DIAGNOSIS — N390 Urinary tract infection, site not specified: Secondary | ICD-10-CM | POA: Diagnosis not present

## 2022-01-16 DIAGNOSIS — N200 Calculus of kidney: Secondary | ICD-10-CM | POA: Diagnosis not present

## 2022-02-08 ENCOUNTER — Other Ambulatory Visit: Payer: Self-pay | Admitting: Cardiovascular Disease

## 2022-02-10 DIAGNOSIS — N200 Calculus of kidney: Secondary | ICD-10-CM | POA: Diagnosis not present

## 2022-02-10 DIAGNOSIS — D494 Neoplasm of unspecified behavior of bladder: Secondary | ICD-10-CM | POA: Diagnosis not present

## 2022-02-13 DIAGNOSIS — R5381 Other malaise: Secondary | ICD-10-CM | POA: Diagnosis not present

## 2022-02-13 DIAGNOSIS — T782XXD Anaphylactic shock, unspecified, subsequent encounter: Secondary | ICD-10-CM | POA: Diagnosis not present

## 2022-02-13 DIAGNOSIS — Z881 Allergy status to other antibiotic agents status: Secondary | ICD-10-CM | POA: Diagnosis not present

## 2022-02-13 DIAGNOSIS — F411 Generalized anxiety disorder: Secondary | ICD-10-CM | POA: Diagnosis not present

## 2022-02-13 DIAGNOSIS — M1990 Unspecified osteoarthritis, unspecified site: Secondary | ICD-10-CM | POA: Diagnosis not present

## 2022-02-13 DIAGNOSIS — Z09 Encounter for follow-up examination after completed treatment for conditions other than malignant neoplasm: Secondary | ICD-10-CM | POA: Diagnosis not present

## 2022-03-03 ENCOUNTER — Telehealth: Payer: Self-pay

## 2022-03-03 NOTE — Telephone Encounter (Signed)
..     Pre-operative Risk Assessment    Patient Name: Connor Cuevas  DOB: [birth year] MRN: [id number]      Request for Surgical Clearance    Procedure:   CYSTO, BILATERAL US, LITHO STENT  Date of Surgery:  Clearance TBD                                 Surgeon:  DR Sande Brothers Surgeon's Group or Practice Name:  [facility] Phone number:  [phone] Fax number:  [phone]   Type of Clearance Requested:   - Medical  - Pharmacy:  Hold Aspirin and Clopidogrel (Plavix)     Type of Anesthesia:  Not Indicated   Additional requests/questions:    Gwenlyn Found   03/03/2022, 2:29 PM

## 2022-03-03 NOTE — Telephone Encounter (Signed)
   Name: Connor Cuevas  DOB: 1940/06/15  MRN: [id number]  Primary Cardiologist: [name], MD   Preoperative team, please contact this patient and set up a phone call appointment for further preoperative risk assessment. Please obtain consent and complete medication review. Thank you for your help.  I confirm that guidance regarding antiplatelet and oral anticoagulation therapy has been completed and, if necessary, noted below.  His aspirin and Plavix may be held for 5 to 7 days prior to his procedure.  Please resume as soon as hemostasis is achieved.   Deberah Pelton, NP 03/03/2022, 2:48 PM [place]

## 2022-03-07 ENCOUNTER — Telehealth: Payer: Self-pay | Admitting: *Deleted

## 2022-03-07 NOTE — Telephone Encounter (Signed)
Pt has been scheduled for tele pre op 04/04/22, med rec and consent are done.     Patient Consent for Virtual Visit        Connor Cuevas has provided verbal consent on 03/07/2022 for a virtual visit (video or telephone).   CONSENT FOR VIRTUAL VISIT FOR:  Connor Cuevas  By participating in this virtual visit I agree to the following:  I hereby voluntarily request, consent and authorize [facility] and its employed or contracted physicians, physician assistants, nurse practitioners or other licensed health care professionals (the Practitioner), to provide me with telemedicine health care services (the "Services") as deemed necessary by the treating Practitioner. I acknowledge and consent to receive the Services by the Practitioner via telemedicine. I understand that the telemedicine visit will involve communicating with the Practitioner through live audiovisual communication technology and the disclosure of certain medical information by electronic transmission. I acknowledge that I have been given the opportunity to request an in-person assessment or other available alternative prior to the telemedicine visit and am voluntarily participating in the telemedicine visit.  I understand that I have the right to withhold or withdraw my consent to the use of telemedicine in the course of my care at any time, without affecting my right to future care or treatment, and that the Practitioner or I may terminate the telemedicine visit at any time. I understand that I have the right to inspect all information obtained and/or recorded in the course of the telemedicine visit and may receive copies of available information for a reasonable fee.  I understand that some of the potential risks of receiving the Services via telemedicine include:  Delay or interruption in medical evaluation due to technological equipment failure or disruption; Information transmitted may not be sufficient (e.g. poor resolution  of images) to allow for appropriate medical decision making by the Practitioner; and/or  In rare instances, security protocols could fail, causing a breach of personal health information.  Furthermore, I acknowledge that it is my responsibility to provide information about my medical history, conditions and care that is complete and accurate to the best of my ability. I acknowledge that Practitioner's advice, recommendations, and/or decision may be based on factors not within their control, such as incomplete or inaccurate data provided by me or distortions of diagnostic images or specimens that may result from electronic transmissions. I understand that the practice of medicine is not an exact science and that Practitioner makes no warranties or guarantees regarding treatment outcomes. I acknowledge that a copy of this consent can be made available to me via my patient portal ([place]), or I can request a printed copy by calling the office of [facility].    I understand that my insurance will be billed for this visit.   I have read or had this consent read to me. I understand the contents of this consent, which adequately explains the benefits and risks of the Services being provided via telemedicine.  I have been provided ample opportunity to ask questions regarding this consent and the Services and have had my questions answered to my satisfaction. I give my informed consent for the services to be provided through the use of telemedicine in my medical care

## 2022-03-07 NOTE — Telephone Encounter (Signed)
Pt has been scheduled for tele pre op 04/04/22, med rec and consent are done.

## 2022-04-03 DIAGNOSIS — J441 Chronic obstructive pulmonary disease with (acute) exacerbation: Secondary | ICD-10-CM | POA: Diagnosis not present

## 2022-04-04 ENCOUNTER — Ambulatory Visit: Payer: Medicare PPO | Attending: Internal Medicine | Admitting: Physician Assistant

## 2022-04-04 DIAGNOSIS — Z01810 Encounter for preprocedural cardiovascular examination: Secondary | ICD-10-CM | POA: Diagnosis not present

## 2022-04-04 NOTE — Progress Notes (Signed)
Virtual Visit via Telephone Note   Because of Connor Cuevas's co-morbid illnesses, he is at least at moderate risk for complications without adequate follow up.  This format is felt to be most appropriate for this patient at this time.  The patient did not have access to video technology/had technical difficulties with video requiring transitioning to audio format only (telephone).  All issues noted in this document were discussed and addressed.  No physical exam could be performed with this format.  Please refer to the patient's chart for his consent to telehealth for [facility].  Evaluation Performed:  Preoperative cardiovascular risk assessment _____________   Date:  04/04/2022   Patient ID:  Connor Cuevas, Connor Cuevas June 09, 1940, MRN [id number] Patient Location:  Home Provider location:   Office  Primary Care Provider:  [name], MD Primary Cardiologist:  [name], MD  Chief Complaint / Patient Profile   82 y.o. y/o male with a h/o CAD with prior PCI 2015, COPD, RUL nodule, HLD, AAA s/p aorta bi-iliac stent, prior infrarenal stent graft placement, malnutrition, and bladder cancer who is pending cystoscopy with bilateral US and lithotripsy and stent placement and presents today for telephonic preoperative cardiovascular risk assessment.  History of Present Illness    Connor Cuevas is a 82 y.o. male who presents via audio/video conferencing for a telehealth visit today.  Pt was last seen in cardiology clinic on Christen Bame NP by 11/21/21.  At that time Connor Cuevas was doing well.  Nuclear stress test 06/2019 was negative for ischemia.   The patient is now pending procedure as outlined above. Since his last visit, he has done well. He walks 1 mile daily that takes him about 25-30 min, no cardiac complaints.  Past Medical History    Past Medical History:  Diagnosis Date   AAA (abdominal aortic aneurysm) ([place]) 04/16/2018   4.3 cm, noted on [place]    Acute kidney  failure ([place]) 2005 approx   after ureteroscopy, hospitalized for about 10 days afterwards   Anxiety    Arthritis    Hands, wrist, elbow, back and spine oa   Bladder mass    Blind right eye since 07-2019   Bulging lumbar disc    L4-L5 degeneration, Chronic back pain   CAD (coronary artery disease)    a. 01/09/14: [place] s/p DES to RCA    COPD (chronic obstructive pulmonary disease) ([place])    wheezing   Dyspnea    with exertion   Glaucoma of both eyes    Hepatic cyst 2013   Several tiny hepatic cysts , noted on CT   History of kidney stones    small kidney stones in right kidney   Hyperlipidemia    Interstitial lung disease ([place])    managed by pcp dr Memory Dance swayne   Pneumonia yrs ago   Pulmonary nodule, right 08/02/2016   a. 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 Aortosclerosis, bilateral - mild with calcification. Per Dr. Moreen Fowler at [facility]    Wears dentures    upper    Wears glasses    Past Surgical History:  Procedure Laterality Date   ABDOMINAL AORTIC ENDOVASCULAR STENT GRAFT  06/21/2020   Procedure: ABDOMINAL AORTIC ENDOVASCULAR STENT GRAFT;  Surgeon: Marty Heck,  MD;  Location: [facility];  Service: Vascular;;   BIOPSY  01/18/2021   Procedure: BIOPSY;  Surgeon: Wilford Corner, MD;  [facility]: [facility];  Service: Endoscopy;;  EGD and COLON   CARDIAC CATHETERIZATION  2017   right carotid stent   CATARACT EXTRACTION W/ INTRAOCULAR LENS  IMPLANT, BILATERAL  1997 (APPROX)   CATARACT EXTRACTION W/PHACO Left 03/31/2020   Procedure: CATARACT EXTRACTION PHACO AND INTRAOCULAR LENS PLACEMENT (IOC) LEFT 5.51 01:16.1 7.2%;  Surgeon: Leandrew Koyanagi, MD;  Location: [place];  Service: Ophthalmology;  Laterality: Left;   COLONOSCOPY  2017   COLONOSCOPY WITH PROPOFOL N/A 01/18/2021   Procedure: COLONOSCOPY  WITH PROPOFOL;  Surgeon: Wilford Corner, MD;  [facility];  Service: Endoscopy;  Laterality: N/A;   CYSTOSCOPY W/ URETERAL STENT PLACEMENT Bilateral 08/13/2020   Procedure: CYSTOSCOPY WITH BILATERAL RETROGRADE PYELOGRAM;  Surgeon: Janith Lima, MD;  [facility];  Service: Urology;  Laterality: Bilateral;   CYSTOSCOPY/RETROGRADE/URETEROSCOPY/STONE EXTRACTION WITH BASKET  10/17/2011   Procedure: CYSTOSCOPY/RETROGRADE/URETEROSCOPY/STONE EXTRACTION WITH BASKET;  Surgeon: Hanley Ben, MD;  Location: [place];  Service: Urology;  Laterality: Right;   CYSTOSCOPY/URETEROSCOPY/HOLMIUM LASER/STENT PLACEMENT Right 05/13/2018   Procedure: CYSTOSCOPY/RETROGRADE/URETEROSCOPY/HOLMIUM LASER;  Surgeon: Kathie Rhodes, MD;  Location: [facility];  Service: Urology;  Laterality: Right;   ESOPHAGOGASTRODUODENOSCOPY (EGD) WITH PROPOFOL N/A 01/18/2021   Procedure: ESOPHAGOGASTRODUODENOSCOPY (EGD) WITH PROPOFOL;  Surgeon: Wilford Corner, MD;  [facility];  Service: Endoscopy;  Laterality: N/A;   HERNIA REPAIR  15 yrs ago   bil inguinal   LAPAROSCOPIC LEFT INGUINAL (OBTURATOR) HERNIA AND UMBILICAL HERNIA REPAIR  01-13-2011   LEFT HEART CATHETERIZATION WITH CORONARY ANGIOGRAM N/A 01/09/2014   Procedure: LEFT HEART CATHETERIZATION WITH CORONARY ANGIOGRAM;  Surgeon: Josue Hector, MD;  Location[facility] C[facility];  Service: Cardiovascular;  Laterality: N/A;   LEFT URETEROSCOPIC STONE EXTRACTION  11-30-2000   W/ PREVIOUS ESWL AND URETERAL STENT PLACEMENT   PHOTOCOAGULATION Right 12/10/2019   Procedure: TRANSCLEARAL DIODE CYCLOPHOTOCOAGULATION RIGHT;  Surgeon: Leandrew Koyanagi, MD;  Location: [place];  Service: Ophthalmology;  Laterality: Right;   REPAIR RIGHT INGUINAL HERNIA W/ MESH  12-31-2000   SUBMUCOSAL TATTOO INJECTION  01/18/2021   Procedure: SUBMUCOSAL TATTOO INJECTION;  Surgeon: Wilford Corner, MD;  [facility];  Service: Endoscopy;;   TONSILLECTOMY  age 56   TRANSURETHRAL RESECTION OF BLADDER TUMOR N/A 08/13/2020   Procedure: TRANSURETHRAL RESECTION OF BLADDER TUMOR (TURBT) WITH POSTOPERATIVE INSTILLATION OF GEMCITABINE;  Surgeon: Janith Lima, MD;  Location: [facility];  Service: Urology;  Laterality: N/A;  GENERAL ANESTHESIA WITH INTUBATED, PARALYSIS   ULTRASOUND GUIDANCE FOR VASCULAR ACCESS Bilateral 06/21/2020   Procedure: ULTRASOUND GUIDANCE FOR VASCULAR ACCESS;  Surgeon: Marty Heck, MD;  Location: [place];  Service: Vascular;  Laterality: Bilateral;   URETEROLITHOTOMY  2009    Allergies  Allergies  Allergen Reactions   Cefaclor Anaphylaxis, Rash and Swelling    Throat closed   Nitrofurantoin Anaphylaxis    Right kidney shut down macrobid   Chantix [Varenicline] Other (See Comments)    nightmares   Nsaids Other (See Comments)    Due to blood thinner and asa use    Home Medications    Prior to Admission medications   Medication Sig Start Date End Date Taking? Authorizing Provider  amLODipine (NORVASC) 2.5 MG tablet Take 1 tablet (2.5 mg total) by mouth daily. 12/30/20 03/07/22  Pokhrel, Corrie Mckusick, MD  aspirin EC 81 MG EC tablet Take  1 tablet (81 mg total) by mouth daily at 6 (six) AM. Swallow whole. 06/23/20   Rhyne, Hulen Shouts, PA-C  atorvastatin (LIPITOR) 80 MG tablet Take 1 tablet (80 mg total) by mouth every morning. 05/04/20   Josue Hector, MD  brimonidine (ALPHAGAN P) 0.1 % SOLN Place 1 drop into both eyes 2 (two) times daily.    [provider]  cholecalciferol (VITAMIN D3) 25 MCG (1000 UNIT) tablet Take 1,000 Units by mouth daily.    [provider]  clopidogrel (PLAVIX) 75 MG tablet Take 1 tablet by mouth once daily 02/08/22   Josue Hector, MD  Coenzyme Q10 (CO Q 10) 100 MG CAPS Take 100 mg by mouth daily.    [provider]  GARLIC PO Take 1 tablet by mouth daily.    [provider]  HYDROcodone-acetaminophen  (NORCO/VICODIN) 5-325 MG tablet Take 1 tablet by mouth every 6 (six) hours as needed for moderate pain. 08/13/20   Janith Lima, MD  latanoprost (XALATAN) 0.005 % ophthalmic solution Place 1 drop into both eyes at bedtime.    [provider]  metoprolol succinate (TOPROL-XL) 25 MG 24 hr tablet Take 0.5 tablets (12.5 mg total) by mouth daily. 11/21/21   Swinyer, Lanice Schwab, NP  Multiple Vitamin (MULTIVITAMIN) tablet Take 1 tablet by mouth daily.    [provider]  Multiple Vitamins-Minerals (ICAPS AREDS FORMULA PO) Take 1 tablet by mouth 2 (two) times daily.    [provider]  nitroGLYCERIN (NITROSTAT) 0.4 MG SL tablet Place 1 tablet (0.4 mg total) under the tongue every 5 (five) minutes as needed for chest pain. 11/21/21   Swinyer, Lanice Schwab, NP  OMEGA-3 KRILL OIL PO Take 200 mg by mouth daily.    [provider]  PROAIR HFA 108 [phone] Base) MCG/ACT inhaler Inhale 2 puffs into the lungs every 4 (four) hours as needed for wheezing. 09/16/18   [provider]  pseudoephedrine (SUDAFED) 120 MG 12 hr tablet Take 120 mg by mouth daily as needed for congestion.    [provider]  tiotropium (SPIRIVA) 18 MCG inhalation capsule Place 18 mcg into inhaler and inhale daily.    [provider]  traZODone (DESYREL) 100 MG tablet Take 100 mg by mouth at bedtime.  03/10/15   [provider]    Physical Exam    Vital Signs:  Connor Cuevas does not have vital signs available for review today.  Given telephonic nature of communication, physical exam is limited. AAOx3. NAD. Normal affect.  Speech and respirations are unlabored.  Accessory Clinical Findings    None  Assessment & Plan    1.  Preoperative Cardiovascular Risk Assessment:  The patient affirms he has been doing well without any new cardiac symptoms. They are able to achieve 4.0 METS without cardiac limitations. Therefore, based on ACC/AHA guidelines, the patient would be at  acceptable risk for the planned procedure without further cardiovascular testing. The patient was advised that if he develops new symptoms prior to surgery to contact our office to arrange for a follow-up visit, and he verbalized understanding.     Connor Cuevas perioperative risk of a major cardiac event is 0.9% according to the Revised Cardiac Risk Index (RCRI).  Therefore, he is at low risk for perioperative complications.      Recommendations: According to ACC/AHA guidelines, no further cardiovascular testing needed.  The patient may proceed to surgery at acceptable risk.    Antiplatelet and/or Anticoagulation Recommendations:  Patient remains on DAPT with ASA/Plavix for CAD with prior PCI in 2015 and AAA s/p EVAR 06/2020. From a cardiac standpoint, he may hold plavix 5-7 days prior to procedure. Given the patient's comorbid conditions, we prefer to continue ASA throughout the perioperative period. However, if doing so significantly increases morbidity or mortality, may hold for 5-7 days. Given AAA repair, would also recommend input from VVS.      The patient was advised that if he develops new symptoms prior to surgery to contact our office to arrange for a follow-up visit, and he verbalized understanding.  A copy of this note will be routed to requesting surgeon.  Time:   Today, I have spent 8 minutes with the patient with telehealth technology discussing medical history, symptoms, and management plan.     Tami Lin Savian Mazon, PA  04/04/2022, 10:21 AM

## 2022-04-12 DIAGNOSIS — N200 Calculus of kidney: Secondary | ICD-10-CM | POA: Diagnosis not present

## 2022-04-12 DIAGNOSIS — D494 Neoplasm of unspecified behavior of bladder: Secondary | ICD-10-CM | POA: Diagnosis not present

## 2022-05-03 NOTE — Progress Notes (Signed)
Patient ID: Connor Cuevas, male   DOB: [birth year], 82 y.o.   MRN: [id number]      81 y.o. with history of COPD, RUL nodule previous smoker. CAD with DES to the mid RCA in October 2015. Myovue done 06/05/19 was non ischemic and low risk  On statin for HLD. Needs f/u US abdomen 04/29/19 AAA 4.5 cm  Had MRI spine for back pain and noted AAA 4.9 cm   No chest pain Compliant with meds   05/13/18 had ureteroscopic procedure for nephrolithiasis. No issues holding plavix   Has had sinus infection most of January with malaise. 2 courses of antibiotics and prednisone  Had cataract surgery 03/31/20 on left eye  Chronic back pain with ? 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  All other systems reviewed and negative  General: BP (!) 140/88   Pulse 91   Ht '5\' 9"'$  (1.753 m)   Wt 124 lb 1.3 oz (56.3 kg)   SpO2 97%   BMI 18.32 kg/m  Affect appropriate Healthy:  appears stated age 51: normal Neck supple with no adenopathy JVP normal no bruits no  thyromegaly Lungs clear with no wheezing and good diaphragmatic motion Heart:  S1/S2 no murmur, no rub, gallop or click PMI normal Abdomen: benighn, BS positve, no tenderness, abdominal aorta palpable not tender  no bruit.  No HSM or HJR Distal pulses intact with no bruits No edema Neuro non-focal Skin warm and dry No muscular weakness  Current Outpatient Medications  Medication Sig Dispense Refill   amLODipine (NORVASC) 2.5 MG tablet Take 1 tablet (2.5 mg total) by mouth daily. 30 tablet 2   aspirin EC 81 MG EC tablet Take 1 tablet (81 mg total) by mouth daily at 6 (six) AM. Swallow whole.     atorvastatin (LIPITOR) 80 MG tablet Take 1 tablet (80 mg total) by mouth every morning. 90 tablet 3   brimonidine (ALPHAGAN P) 0.1 % SOLN Place 1 drop into both eyes 2 (two) times daily.     cholecalciferol (VITAMIN D3) 25 MCG (1000 UNIT) tablet Take 1,000 Units by mouth daily.     clopidogrel (PLAVIX) 75 MG tablet Take 1 tablet by mouth once daily 90 tablet 2   Coenzyme Q10 (CO Q 10) 100 MG CAPS Take 100 mg by mouth daily.     GARLIC PO Take 1 tablet by mouth daily.     HYDROcodone-acetaminophen (NORCO/VICODIN) 5-325 MG tablet Take 1 tablet by mouth every 6 (six) hours as needed for moderate pain. 12 tablet 0   latanoprost (XALATAN) 0.005 % ophthalmic  solution Place 1 drop into both eyes at bedtime.     metoprolol succinate (TOPROL-XL) 25 MG 24 hr tablet Take 0.5 tablets (12.5 mg total) by mouth daily. 45 tablet 3   Multiple Vitamin (MULTIVITAMIN) tablet Take 1 tablet by mouth daily.     Multiple Vitamins-Minerals (ICAPS AREDS FORMULA PO) Take 1 tablet by mouth 2 (two) times daily.     nitroGLYCERIN (NITROSTAT) 0.4 MG SL tablet Place 1 tablet (0.4 mg total) under the tongue every 5 (five) minutes as needed for chest pain. 25 tablet 5   OMEGA-3 KRILL OIL PO Take 200 mg by mouth daily.     PROAIR HFA 108 (90 Base) MCG/ACT inhaler Inhale 2 puffs into the lungs every 4 (four) hours as needed for  wheezing.     pseudoephedrine (SUDAFED) 120 MG 12 hr tablet Take 120 mg by mouth daily as needed for congestion.     tiotropium (SPIRIVA) 18 MCG inhalation capsule Place 18 mcg into inhaler and inhale daily.     traZODone (DESYREL) 100 MG tablet Take 100 mg by mouth at bedtime.      No current facility-administered medications for this visit.   Facility-Administered Medications Ordered in Other Visits  Medication Dose Route Frequency Provider Last Rate Last Admin   clindamycin (CLEOCIN) 900 mg in dextrose 5 % 50 mL IVPB  900 mg Intravenous 60 min Pre-Op Connor Ruff, MD       And   gentamicin (GARAMYCIN) 290 mg in dextrose 5 % 50 mL IVPB  5 mg/kg Intravenous 60 min Pre-Op Connor Ruff, MD        Allergies  Cefaclor, Nitrofurantoin, Fluticasone, Nsaids, Rosuvastatin, and Varenicline  Electrocardiogram:   05/10/2022 SR  RAE rate 91 otherwise noraml   CAD: DES to RCA 2015  Continue medical Rx  Myovue done 06/05/19 normal no ischemia HR Better on Atenolol Ok to hold ASSA/Plavix for colectomy and proceed Can do 5 mets, TTE 12/28/20 shows normal EF 60-65% and no valve disease He has not had angina and myovue with no ischemia. Proceed with robotic hemi colectomy with Connor Cuevas Hold ASA/Plavix 5 days before surgery   AAA:  4.5 cm by last duplex 04/29/19 ? 4.9 cm on MRI done for back on 10/27/19 US done 04/29/20 Now 5.3 cm refer to Connor Cuevas for possible stent graft intervention   HLD:  On statin labs with primary LDL 145 increase lipitor to 80 mg daily   COPD:  With RUL nodule 14 mm by CT 12/28/20 thought to be stable Discussed benefits fo not smoking even for 2 weeks before surgery   Preoperative:  Currently not needing surgery for colon polyps or kidney stones Ok to proceed if either act up in near future    F/U 6 months  F/U VVS for endograft  F/U GI polyps F/U GU stones   Connor Rouge MD [facility]

## 2022-05-05 DIAGNOSIS — Z0100 Encounter for examination of eyes and vision without abnormal findings: Secondary | ICD-10-CM | POA: Diagnosis not present

## 2022-05-05 DIAGNOSIS — H401113 Primary open-angle glaucoma, right eye, severe stage: Secondary | ICD-10-CM | POA: Diagnosis not present

## 2022-05-05 DIAGNOSIS — H401121 Primary open-angle glaucoma, left eye, mild stage: Secondary | ICD-10-CM | POA: Diagnosis not present

## 2022-05-05 DIAGNOSIS — H353132 Nonexudative age-related macular degeneration, bilateral, intermediate dry stage: Secondary | ICD-10-CM | POA: Diagnosis not present

## 2022-05-10 ENCOUNTER — Encounter: Payer: Self-pay | Admitting: Cardiovascular Disease

## 2022-05-10 ENCOUNTER — Ambulatory Visit: Payer: Medicare PPO | Attending: Cardiovascular Disease | Admitting: Cardiovascular Disease

## 2022-05-10 VITALS — BP 140/88 | HR 91 | Ht 69.0 in | Wt 124.1 lb

## 2022-05-10 DIAGNOSIS — I2583 Coronary atherosclerosis due to lipid rich plaque: Secondary | ICD-10-CM

## 2022-05-10 DIAGNOSIS — I2510 Atherosclerotic heart disease of native coronary artery without angina pectoris: Secondary | ICD-10-CM | POA: Diagnosis not present

## 2022-05-10 DIAGNOSIS — E782 Mixed hyperlipidemia: Secondary | ICD-10-CM

## 2022-05-10 DIAGNOSIS — R0789 Other chest pain: Secondary | ICD-10-CM | POA: Diagnosis not present

## 2022-05-10 DIAGNOSIS — I714 Abdominal aortic aneurysm, without rupture, unspecified: Secondary | ICD-10-CM

## 2022-05-10 DIAGNOSIS — Z01810 Encounter for preprocedural cardiovascular examination: Secondary | ICD-10-CM | POA: Diagnosis not present

## 2022-05-10 NOTE — Patient Instructions (Signed)
Medication Instructions:  Your physician recommends that you continue on your current medications as directed. Please refer to the Current Medication list given to you today.  *If you need a refill on your cardiac medications before your next appointment, please call your pharmacy*   Lab Work: None.  If you have labs (blood work) drawn today and your tests are completely normal, you will receive your results only by: [place] (if you have MyChart) OR A paper copy in the mail If you have any lab test that is abnormal or we need to change your treatment, we will call you to review the results.   Testing/Procedures: None.   Follow-Up: At [facility], you and your health needs are our priority.  As part of our continuing mission to provide you with exceptional heart care, we have created designated Provider Care Teams.  These Care Teams include your primary Cardiologist (physician) and Advanced Practice Providers (APPs -  Physician Assistants and Nurse Practitioners) who all work together to provide you with the care you need, when you need it.  We recommend signing up for the patient portal called "MyChart".  Sign up information is provided on this After Visit Summary.  MyChart is used to connect with patients for Virtual Visits (Telemedicine).  Patients are able to view lab/test results, encounter notes, upcoming appointments, etc.  Non-urgent messages can be sent to your provider as well.   To learn more about what you can do with MyChart, go to [email or web address].    Your next appointment:   6 month(s)  Provider:   [name], MD

## 2022-05-22 DIAGNOSIS — J441 Chronic obstructive pulmonary disease with (acute) exacerbation: Secondary | ICD-10-CM | POA: Diagnosis not present

## 2022-06-16 DIAGNOSIS — I10 Essential (primary) hypertension: Secondary | ICD-10-CM | POA: Diagnosis not present

## 2022-06-16 DIAGNOSIS — I25119 Atherosclerotic heart disease of native coronary artery with unspecified angina pectoris: Secondary | ICD-10-CM | POA: Diagnosis not present

## 2022-06-16 DIAGNOSIS — F321 Major depressive disorder, single episode, moderate: Secondary | ICD-10-CM | POA: Diagnosis not present

## 2022-06-16 DIAGNOSIS — E782 Mixed hyperlipidemia: Secondary | ICD-10-CM | POA: Diagnosis not present

## 2022-06-16 DIAGNOSIS — I700 Atherosclerosis of aorta: Secondary | ICD-10-CM | POA: Diagnosis not present

## 2022-06-16 DIAGNOSIS — J449 Chronic obstructive pulmonary disease, unspecified: Secondary | ICD-10-CM | POA: Diagnosis not present

## 2022-06-16 DIAGNOSIS — R634 Abnormal weight loss: Secondary | ICD-10-CM | POA: Diagnosis not present

## 2022-06-16 DIAGNOSIS — Z0000 Encounter for general adult medical examination without abnormal findings: Secondary | ICD-10-CM | POA: Diagnosis not present

## 2022-06-16 DIAGNOSIS — Z1159 Encounter for screening for other viral diseases: Secondary | ICD-10-CM | POA: Diagnosis not present

## 2022-06-16 DIAGNOSIS — M1990 Unspecified osteoarthritis, unspecified site: Secondary | ICD-10-CM | POA: Diagnosis not present

## 2022-08-09 DIAGNOSIS — N200 Calculus of kidney: Secondary | ICD-10-CM | POA: Diagnosis not present

## 2022-08-09 DIAGNOSIS — N3020 Other chronic cystitis without hematuria: Secondary | ICD-10-CM | POA: Diagnosis not present

## 2022-10-12 DIAGNOSIS — N200 Calculus of kidney: Secondary | ICD-10-CM | POA: Diagnosis not present

## 2022-10-12 DIAGNOSIS — N3020 Other chronic cystitis without hematuria: Secondary | ICD-10-CM | POA: Diagnosis not present

## 2022-11-03 DIAGNOSIS — H401121 Primary open-angle glaucoma, left eye, mild stage: Secondary | ICD-10-CM | POA: Diagnosis not present

## 2022-11-10 ENCOUNTER — Ambulatory Visit: Payer: Medicare PPO | Admitting: Cardiovascular Disease

## 2022-11-10 DIAGNOSIS — Z961 Presence of intraocular lens: Secondary | ICD-10-CM | POA: Diagnosis not present

## 2022-11-10 DIAGNOSIS — H353132 Nonexudative age-related macular degeneration, bilateral, intermediate dry stage: Secondary | ICD-10-CM | POA: Diagnosis not present

## 2022-11-10 DIAGNOSIS — H401121 Primary open-angle glaucoma, left eye, mild stage: Secondary | ICD-10-CM | POA: Diagnosis not present

## 2022-11-10 DIAGNOSIS — H401113 Primary open-angle glaucoma, right eye, severe stage: Secondary | ICD-10-CM | POA: Diagnosis not present

## 2022-11-12 NOTE — Progress Notes (Unsigned)
Cardiology Office Note:  .   Date:  11/13/2022  ID:  Connor Cuevas, DOB [birth year], MRN [id number] PCP: Tally Joe, MD  [facility] Providers Cardiologist:  Charlton Haws, MD    History of Present Illness: .   Connor Cuevas is a 82 y.o. 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  Nuclear stress test 06/05/2019 indicated low risk with no evidence of ischemia.  MRI of abdomen 01/17/2021 revealed aneurysm sac measures 4.1 x 5.0 cm, grossly unchanged.  Last seen in office by Dr. Eden Emms on 05/10/2022 for follow up. He had remained stable from a cardiac perspective. He has also followed with palliative care in the past.  Today he presents for follow-up regarding CAD.  He reports that he has been doing very well overall, he has no concerns or complaints.  He has been staying active working in his yard with his neighbor, tolerating well.  Notes that he has not recently been seen by [facility], plans to follow-up with them regarding history of aneurysm.  He denies chest pain, shortness of breath, lower extremity swelling, PND or orthopnea.  ROS: Today he denies chest pain, shortness of breath, lower extremity edema, fatigue, palpitations, melena, hematuria, hemoptysis, diaphoresis, weakness, presyncope, syncope, orthopnea, and PND.   Studies Reviewed: .       Cardiac Studies & Procedures     STRESS TESTS  MYOCARDIAL PERFUSION IMAGING 06/05/2019  Narrative  Lexiscan stress without ST T Wave changes  Myovue scan with normal perfusion, minimal apical thinning  LVEF calculated at 49% though visual estimate suggests LVEF is greater  This is a low risk study.   ECHOCARDIOGRAM  ECHOCARDIOGRAM COMPLETE 12/28/2020  Narrative ECHOCARDIOGRAM REPORT    Patient Name:   Connor Cuevas Date of Exam: 12/28/2020 Medical Rec #:  [id number]       Height:       69.0 in Accession #:    [id number]     Weight:       143.3 lb Date of Birth:  [birth year]      BSA:          1.793 m Patient Age:    80 years       BP:           166/80 mmHg Patient Gender: M              HR:           95 bpm. Exam Location:  Inpatient  Procedure: 2D Echo, Cardiac Doppler and Color Doppler  Indications:    Dyspnea R06.00  History:        Patient has prior history of Echocardiogram examinations, most recent 01/09/2014. CAD, COPD, AAA; Risk Factors:Dyslipidemia.  Sonographer:    Eulah Pont RDCS Referring Phys: [id number] ANGELA NICOLE DUKE  IMPRESSIONS   1. Left ventricular ejection fraction, by estimation, is 60 to 65%. The left ventricle has normal function. The left ventricle has no regional wall motion abnormalities. Left ventricular diastolic parameters are consistent with Grade I diastolic dysfunction (impaired relaxation). 2. Right ventricular systolic function is normal. The right ventricular size is normal. 3. The mitral valve is normal in structure. No evidence of mitral valve regurgitation. No evidence of mitral stenosis. 4. The aortic valve is normal in structure. Aortic valve regurgitation is not visualized. No aortic stenosis is present.  5. The inferior vena cava is normal in size with greater than 50% respiratory variability, suggesting right atrial pressure of 3 mmHg.  FINDINGS Left Ventricle: Left ventricular ejection fraction, by estimation, is 60 to 65%. The left ventricle has normal function. The left ventricle has no regional wall motion abnormalities. The left ventricular internal cavity size was normal in size. There is no left ventricular hypertrophy. Left ventricular diastolic parameters are consistent with Grade I diastolic dysfunction (impaired relaxation).  Right Ventricle: The right ventricular size is normal. No increase in right ventricular wall thickness. Right ventricular systolic function is normal.  Left Atrium: Left  atrial size was normal in size.  Right Atrium: Right atrial size was normal in size.  Pericardium: There is no evidence of pericardial effusion.  Mitral Valve: The mitral valve is normal in structure. No evidence of mitral valve regurgitation. No evidence of mitral valve stenosis.  Tricuspid Valve: The tricuspid valve is normal in structure. Tricuspid valve regurgitation is not demonstrated. No evidence of tricuspid stenosis.  Aortic Valve: The aortic valve is normal in structure. Aortic valve regurgitation is not visualized. No aortic stenosis is present.  Pulmonic Valve: The pulmonic valve was normal in structure. Pulmonic valve regurgitation is trivial. No evidence of pulmonic stenosis.  Aorta: The aortic root is normal in size and structure.  Venous: The inferior vena cava is normal in size with greater than 50% respiratory variability, suggesting right atrial pressure of 3 mmHg.  IAS/Shunts: No atrial level shunt detected by color flow Doppler.   LEFT VENTRICLE PLAX 2D LVIDd:         4.30 cm     Diastology LVIDs:         2.90 cm     LV e' medial:  9.73 cm/s LV PW:         1.10 cm     LV e' lateral: 11.80 cm/s LV IVS:        0.90 cm LVOT diam:     2.10 cm LV SV:         52 LV SV Index:   29 LVOT Area:     3.46 cm  LV Volumes (MOD) LV vol d, MOD A2C: 87.5 ml LV vol d, MOD A4C: 95.5 ml LV vol s, MOD A2C: 44.3 ml LV vol s, MOD A4C: 40.9 ml LV SV MOD A2C:     43.2 ml LV SV MOD A4C:     95.5 ml LV SV MOD BP:      47.5 ml  RIGHT VENTRICLE RV S prime:     13.50 cm/s TAPSE (M-mode): 1.6 cm  LEFT ATRIUM             Index       RIGHT ATRIUM           Index LA diam:        3.00 cm 1.67 cm/m  RA Area:     11.00 cm LA Vol (A2C):   24.0 ml 13.38 ml/m RA Volume:   24.00 ml  13.38 ml/m LA Vol (A4C):   23.7 ml 13.22 ml/m LA Biplane Vol: 25.0 ml 13.94 ml/m AORTIC VALVE LVOT Vmax:   93.60 cm/s LVOT Vmean:  60.800 cm/s LVOT VTI:    0.149 m  AORTA Ao Root diam: 3.70  cm Ao Asc diam:  3.30 cm   SHUNTS Systemic VTI:  0.15 m Systemic Diam: 2.10 cm  Donato Schultz MD Electronically signed by Donato Schultz MD Signature Date/Time: 12/28/2020/3:56:42 PM  Final             Physical Exam:   VS:  BP 124/76   Pulse 68   Ht 5\' 9"  (1.753 m)   Wt 126 lb 6.4 oz (57.3 kg)   SpO2 95%   BMI 18.67 kg/m    Wt Readings from Last 3 Encounters:  11/13/22 126 lb 6.4 oz (57.3 kg)  05/10/22 124 lb 1.3 oz (56.3 kg)  11/21/21 127 lb (57.6 kg)    GEN: Well nourished, well developed in no acute distress NECK: No JVD; No carotid bruits CARDIAC: RRR, no murmurs, rubs, gallops RESPIRATORY:  Clear to auscultation without rales, wheezing or rhonchi  ABDOMEN: Soft, non-tender, non-distended EXTREMITIES:  No edema; No deformity   ASSESSMENT AND PLAN: .    CAD: DES to RCA in 2015. Myoview in [address] normal, no indication of ischemia. Stable with no anginal symptoms. No indication for ischemic evaluation.  Heart healthy diet and regular cardiovascular exercise encouraged. Reviewed ED precautions. Continue atorvastatin, aspirin, Plavix, Toprol-XL. He has been maintained on long term DAPT by Dr. Eden Emms.  HTN: Initial blood pressure today 126/80, on recheck was 124/76. Continue Toprol.  AAA: S/p EVAR for 5.9cm AAA on 06/21/20. Followed by [facility]. Plans to schedule follow up.   HLD: Last lipid profile on 06/16/22 indicated total cholesterol of 164 and LDL of 83. Discussed goal of less than 70, he plans to work on a consistent heart health diet. He has follow up with his primary care on 11/23/22, he plans to discuss further with him. Continue atorvastatin.        Dispo: Follow up with Dr. Eden Emms or Ronie Spies, PA in 6 months or sooner if needed.   Signed, Rip Harbour, NP

## 2022-11-13 ENCOUNTER — Ambulatory Visit: Payer: Medicare PPO | Admitting: Cardiology

## 2022-11-13 ENCOUNTER — Encounter: Payer: Self-pay | Admitting: Physician Assistant

## 2022-11-13 VITALS — BP 124/76 | HR 68 | Ht 69.0 in | Wt 126.4 lb

## 2022-11-13 DIAGNOSIS — I10 Essential (primary) hypertension: Secondary | ICD-10-CM

## 2022-11-13 DIAGNOSIS — I2510 Atherosclerotic heart disease of native coronary artery without angina pectoris: Secondary | ICD-10-CM

## 2022-11-13 DIAGNOSIS — E785 Hyperlipidemia, unspecified: Secondary | ICD-10-CM

## 2022-11-13 DIAGNOSIS — I714 Abdominal aortic aneurysm, without rupture, unspecified: Secondary | ICD-10-CM | POA: Diagnosis not present

## 2022-11-13 NOTE — Patient Instructions (Signed)
Medication Instructions:  Your physician recommends that you continue on your current medications as directed. Please refer to the Current Medication list given to you today.  *If you need a refill on your cardiac medications before your next appointment, please call your pharmacy*   Lab Work: None ordered   If you have labs (blood work) drawn today and your tests are completely normal, you will receive your results only by: MyChart Message (if you have MyChart) OR A paper copy in the mail If you have any lab test that is abnormal or we need to change your treatment, we will call you to review the results.   Testing/Procedures: None ordered    Follow-Up: At [facility], you and your health needs are our priority.  As part of our continuing mission to provide you with exceptional heart care, we have created designated Provider Care Teams.  These Care Teams include your primary Cardiologist (physician) and Advanced Practice Providers (APPs -  Physician Assistants and Nurse Practitioners) who all work together to provide you with the care you need, when you need it.  We recommend signing up for the patient portal called "MyChart".  Sign up information is provided on this After Visit Summary.  MyChart is used to connect with patients for Virtual Visits (Telemedicine).  Patients are able to view lab/test results, encounter notes, upcoming appointments, etc.  Non-urgent messages can be sent to your provider as well.   To learn more about what you can do with MyChart, go to [email or web address].    Your next appointment:   6 month(s)  Provider:   Charlton Haws, MD  or APP   Other Instructions

## 2022-12-05 DIAGNOSIS — F419 Anxiety disorder, unspecified: Secondary | ICD-10-CM | POA: Diagnosis not present

## 2022-12-05 DIAGNOSIS — F321 Major depressive disorder, single episode, moderate: Secondary | ICD-10-CM | POA: Diagnosis not present

## 2022-12-05 DIAGNOSIS — E782 Mixed hyperlipidemia: Secondary | ICD-10-CM | POA: Diagnosis not present

## 2022-12-05 DIAGNOSIS — M1990 Unspecified osteoarthritis, unspecified site: Secondary | ICD-10-CM | POA: Diagnosis not present

## 2022-12-05 DIAGNOSIS — I10 Essential (primary) hypertension: Secondary | ICD-10-CM | POA: Diagnosis not present

## 2022-12-05 DIAGNOSIS — J449 Chronic obstructive pulmonary disease, unspecified: Secondary | ICD-10-CM | POA: Diagnosis not present

## 2022-12-05 DIAGNOSIS — Z23 Encounter for immunization: Secondary | ICD-10-CM | POA: Diagnosis not present

## 2022-12-05 DIAGNOSIS — I25119 Atherosclerotic heart disease of native coronary artery with unspecified angina pectoris: Secondary | ICD-10-CM | POA: Diagnosis not present

## 2022-12-05 DIAGNOSIS — I700 Atherosclerosis of aorta: Secondary | ICD-10-CM | POA: Diagnosis not present

## 2023-03-19 DIAGNOSIS — J441 Chronic obstructive pulmonary disease with (acute) exacerbation: Secondary | ICD-10-CM | POA: Diagnosis not present

## 2023-03-19 DIAGNOSIS — Z6820 Body mass index (BMI) 20.0-20.9, adult: Secondary | ICD-10-CM | POA: Diagnosis not present

## 2023-04-12 DIAGNOSIS — N200 Calculus of kidney: Secondary | ICD-10-CM | POA: Diagnosis not present

## 2023-04-12 DIAGNOSIS — R3129 Other microscopic hematuria: Secondary | ICD-10-CM | POA: Diagnosis not present

## 2023-04-12 DIAGNOSIS — D494 Neoplasm of unspecified behavior of bladder: Secondary | ICD-10-CM | POA: Diagnosis not present

## 2023-05-01 DIAGNOSIS — I25119 Atherosclerotic heart disease of native coronary artery with unspecified angina pectoris: Secondary | ICD-10-CM | POA: Diagnosis not present

## 2023-05-01 DIAGNOSIS — J441 Chronic obstructive pulmonary disease with (acute) exacerbation: Secondary | ICD-10-CM | POA: Diagnosis not present

## 2023-05-01 DIAGNOSIS — M1990 Unspecified osteoarthritis, unspecified site: Secondary | ICD-10-CM | POA: Diagnosis not present

## 2023-05-01 DIAGNOSIS — E782 Mixed hyperlipidemia: Secondary | ICD-10-CM | POA: Diagnosis not present

## 2023-05-01 DIAGNOSIS — F419 Anxiety disorder, unspecified: Secondary | ICD-10-CM | POA: Diagnosis not present

## 2023-05-15 DIAGNOSIS — H401113 Primary open-angle glaucoma, right eye, severe stage: Secondary | ICD-10-CM | POA: Diagnosis not present

## 2023-05-15 DIAGNOSIS — Z961 Presence of intraocular lens: Secondary | ICD-10-CM | POA: Diagnosis not present

## 2023-05-15 DIAGNOSIS — H353132 Nonexudative age-related macular degeneration, bilateral, intermediate dry stage: Secondary | ICD-10-CM | POA: Diagnosis not present

## 2023-05-15 DIAGNOSIS — H401121 Primary open-angle glaucoma, left eye, mild stage: Secondary | ICD-10-CM | POA: Diagnosis not present

## 2023-05-21 DIAGNOSIS — H353231 Exudative age-related macular degeneration, bilateral, with active choroidal neovascularization: Secondary | ICD-10-CM | POA: Diagnosis not present

## 2023-05-21 DIAGNOSIS — H353130 Nonexudative age-related macular degeneration, bilateral, stage unspecified: Secondary | ICD-10-CM | POA: Diagnosis not present

## 2023-05-21 DIAGNOSIS — H401121 Primary open-angle glaucoma, left eye, mild stage: Secondary | ICD-10-CM | POA: Diagnosis not present

## 2023-06-04 DIAGNOSIS — H353221 Exudative age-related macular degeneration, left eye, with active choroidal neovascularization: Secondary | ICD-10-CM | POA: Diagnosis not present

## 2023-06-07 DIAGNOSIS — J441 Chronic obstructive pulmonary disease with (acute) exacerbation: Secondary | ICD-10-CM | POA: Diagnosis not present

## 2023-06-07 DIAGNOSIS — J0100 Acute maxillary sinusitis, unspecified: Secondary | ICD-10-CM | POA: Diagnosis not present

## 2023-06-19 DIAGNOSIS — M1990 Unspecified osteoarthritis, unspecified site: Secondary | ICD-10-CM | POA: Diagnosis not present

## 2023-06-19 DIAGNOSIS — F321 Major depressive disorder, single episode, moderate: Secondary | ICD-10-CM | POA: Diagnosis not present

## 2023-06-19 DIAGNOSIS — I25119 Atherosclerotic heart disease of native coronary artery with unspecified angina pectoris: Secondary | ICD-10-CM | POA: Diagnosis not present

## 2023-06-19 DIAGNOSIS — J449 Chronic obstructive pulmonary disease, unspecified: Secondary | ICD-10-CM | POA: Diagnosis not present

## 2023-06-19 DIAGNOSIS — I10 Essential (primary) hypertension: Secondary | ICD-10-CM | POA: Diagnosis not present

## 2023-06-19 DIAGNOSIS — I700 Atherosclerosis of aorta: Secondary | ICD-10-CM | POA: Diagnosis not present

## 2023-06-19 DIAGNOSIS — F419 Anxiety disorder, unspecified: Secondary | ICD-10-CM | POA: Diagnosis not present

## 2023-06-19 DIAGNOSIS — Z0000 Encounter for general adult medical examination without abnormal findings: Secondary | ICD-10-CM | POA: Diagnosis not present

## 2023-06-19 DIAGNOSIS — E782 Mixed hyperlipidemia: Secondary | ICD-10-CM | POA: Diagnosis not present

## 2023-07-06 DIAGNOSIS — Z720 Tobacco use: Secondary | ICD-10-CM | POA: Diagnosis not present

## 2023-07-06 DIAGNOSIS — J441 Chronic obstructive pulmonary disease with (acute) exacerbation: Secondary | ICD-10-CM | POA: Diagnosis not present

## 2023-07-06 DIAGNOSIS — F17210 Nicotine dependence, cigarettes, uncomplicated: Secondary | ICD-10-CM | POA: Diagnosis not present

## 2023-07-09 DIAGNOSIS — H353221 Exudative age-related macular degeneration, left eye, with active choroidal neovascularization: Secondary | ICD-10-CM | POA: Diagnosis not present

## 2023-08-13 DIAGNOSIS — H353221 Exudative age-related macular degeneration, left eye, with active choroidal neovascularization: Secondary | ICD-10-CM | POA: Diagnosis not present

## 2023-08-20 ENCOUNTER — Telehealth: Payer: Self-pay | Admitting: Cardiovascular Disease

## 2023-08-20 MED ORDER — CLOPIDOGREL BISULFATE 75 MG PO TABS: 75.0000 mg | ORAL_TABLET | Freq: Every day | ORAL | 0 refills | Status: DC

## 2023-08-20 NOTE — Telephone Encounter (Signed)
*  STAT* If patient is at the pharmacy, call can be transferred to refill team.   1. Which medications need to be refilled? (please list name of each medication and dose if known) clopidogrel  (PLAVIX ) 75 MG tablet   Take 1 tablet by mouth once daily    2. Would you like to learn more about the convenience, safety, & potential cost savings by using the [facility]? No   3. Are you open to using the [facility] No   4. Which pharmacy/location (including street and city if local pharmacy) is medication to be sent to?[id number] - [place] ([place]), [place] - [address]    5. Do they need a 30 day or 90 day supply? 90 Day Supply

## 2023-08-20 NOTE — Telephone Encounter (Signed)
 Pt's medication was sent to pt's pharmacy as requested. Confirmation received.

## 2023-09-03 DIAGNOSIS — J019 Acute sinusitis, unspecified: Secondary | ICD-10-CM | POA: Diagnosis not present

## 2023-09-03 DIAGNOSIS — J449 Chronic obstructive pulmonary disease, unspecified: Secondary | ICD-10-CM | POA: Diagnosis not present

## 2023-09-25 DIAGNOSIS — H353221 Exudative age-related macular degeneration, left eye, with active choroidal neovascularization: Secondary | ICD-10-CM | POA: Diagnosis not present

## 2023-10-16 ENCOUNTER — Other Ambulatory Visit: Payer: Self-pay

## 2023-10-16 ENCOUNTER — Observation Stay (HOSPITAL_COMMUNITY)
Admission: EM | Admit: 2023-10-16 | Discharge: 2023-10-20 | Disposition: A | Discharge status: Home / Self Care | Attending: Internal Medicine | Admitting: Internal Medicine

## 2023-10-16 ENCOUNTER — Emergency Department (HOSPITAL_COMMUNITY)

## 2023-10-16 ENCOUNTER — Encounter (HOSPITAL_COMMUNITY): Payer: Self-pay

## 2023-10-16 DIAGNOSIS — Z539 Procedure and treatment not carried out, unspecified reason: Secondary | ICD-10-CM | POA: Diagnosis not present

## 2023-10-16 DIAGNOSIS — E785 Hyperlipidemia, unspecified: Secondary | ICD-10-CM | POA: Diagnosis not present

## 2023-10-16 DIAGNOSIS — I10 Essential (primary) hypertension: Secondary | ICD-10-CM | POA: Insufficient documentation

## 2023-10-16 DIAGNOSIS — I2510 Atherosclerotic heart disease of native coronary artery without angina pectoris: Secondary | ICD-10-CM | POA: Diagnosis present

## 2023-10-16 DIAGNOSIS — R531 Weakness: Secondary | ICD-10-CM | POA: Diagnosis not present

## 2023-10-16 DIAGNOSIS — Z7982 Long term (current) use of aspirin: Secondary | ICD-10-CM | POA: Diagnosis not present

## 2023-10-16 DIAGNOSIS — J441 Chronic obstructive pulmonary disease with (acute) exacerbation: Secondary | ICD-10-CM | POA: Diagnosis not present

## 2023-10-16 DIAGNOSIS — J449 Chronic obstructive pulmonary disease, unspecified: Principal | ICD-10-CM | POA: Insufficient documentation

## 2023-10-16 DIAGNOSIS — F17220 Nicotine dependence, chewing tobacco, uncomplicated: Secondary | ICD-10-CM | POA: Diagnosis not present

## 2023-10-16 DIAGNOSIS — Z720 Tobacco use: Secondary | ICD-10-CM | POA: Diagnosis not present

## 2023-10-16 DIAGNOSIS — R911 Solitary pulmonary nodule: Secondary | ICD-10-CM | POA: Diagnosis not present

## 2023-10-16 DIAGNOSIS — K219 Gastro-esophageal reflux disease without esophagitis: Secondary | ICD-10-CM | POA: Diagnosis present

## 2023-10-16 DIAGNOSIS — R0602 Shortness of breath: Secondary | ICD-10-CM | POA: Diagnosis not present

## 2023-10-16 DIAGNOSIS — J439 Emphysema, unspecified: Secondary | ICD-10-CM | POA: Diagnosis not present

## 2023-10-16 DIAGNOSIS — Z789 Other specified health status: Secondary | ICD-10-CM

## 2023-10-16 DIAGNOSIS — R0789 Other chest pain: Secondary | ICD-10-CM | POA: Diagnosis not present

## 2023-10-16 LAB — CBC
HCT: 50.6 % (ref 39.0–52.0)
Hemoglobin: 16.1 g/dL (ref 13.0–17.0)
MCH: 29.6 pg (ref 26.0–34.0)
MCHC: 31.8 g/dL (ref 30.0–36.0)
MCV: 93 fL (ref 80.0–100.0)
Platelets: 231 K/uL (ref 150–400)
RBC: 5.44 MIL/uL (ref 4.22–5.81)
RDW: 13.5 % (ref 11.5–15.5)
WBC: 9.7 K/uL (ref 4.0–10.5)
nRBC: 0 % (ref 0.0–0.2)

## 2023-10-16 LAB — BASIC METABOLIC PANEL WITH GFR
Anion gap: 9 (ref 5–15)
BUN: 15 mg/dL (ref 8–23)
CO2: 24 mmol/L (ref 22–32)
Calcium: 9.5 mg/dL (ref 8.9–10.3)
Chloride: 105 mmol/L (ref 98–111)
Creatinine, Ser: 0.94 mg/dL (ref 0.61–1.24)
GFR, Estimated: >60 mL/min (ref >60)
Glucose, Bld: 96 mg/dL (ref 70–99)
Potassium: 4.1 mmol/L (ref 3.5–5.1)
Sodium: 138 mmol/L (ref 135–145)

## 2023-10-16 LAB — RESPIRATORY PANEL BY PCR

## 2023-10-16 LAB — TROPONIN I (HIGH SENSITIVITY): Troponin I (High Sensitivity): 7 ng/L (ref <18)

## 2023-10-16 MED ORDER — LEVOFLOXACIN IN D5W 750 MG/150ML IV SOLN: 750.0000 mg | INTRAVENOUS | Status: DC

## 2023-10-16 MED ORDER — ENOXAPARIN SODIUM 40 MG/0.4ML IJ SOSY
40.0000 mg | PREFILLED_SYRINGE | INTRAMUSCULAR | Status: DC
  Administered 2023-10-16 – 2023-10-19 (×4): 40 mg via SUBCUTANEOUS
  Filled 2023-10-16 (×4): qty 0.4

## 2023-10-16 MED ORDER — ONDANSETRON HCL 4 MG PO TABS
4.0000 mg | ORAL_TABLET | Freq: Four times a day (QID) | ORAL | Status: DC | PRN
Start: 2023-10-16 — End: 2023-10-20

## 2023-10-16 MED ORDER — PREDNISONE 20 MG PO TABS
60.0000 mg | ORAL_TABLET | Freq: Once | ORAL | Status: AC
  Administered 2023-10-16: 60 mg via ORAL
  Filled 2023-10-16: qty 3

## 2023-10-16 MED ORDER — ACETAMINOPHEN 325 MG PO TABS: 650.0000 mg | ORAL_TABLET | Freq: Four times a day (QID) | ORAL | Status: DC | PRN

## 2023-10-16 MED ORDER — ONDANSETRON HCL 4 MG/2ML IJ SOLN: 4.0000 mg | Freq: Four times a day (QID) | INTRAMUSCULAR | Status: DC | PRN

## 2023-10-16 MED ORDER — ALBUTEROL SULFATE (2.5 MG/3ML) 0.083% IN NEBU
5.0000 mg | INHALATION_SOLUTION | Freq: Once | RESPIRATORY_TRACT | Status: AC
  Administered 2023-10-16: 5 mg via RESPIRATORY_TRACT
  Filled 2023-10-16: qty 6

## 2023-10-16 MED ORDER — METHYLPREDNISOLONE SODIUM SUCC 40 MG IJ SOLR
40.0000 mg | Freq: Two times a day (BID) | INTRAMUSCULAR | Status: AC
  Administered 2023-10-16 – 2023-10-17 (×2): 40 mg via INTRAVENOUS
  Filled 2023-10-16 (×2): qty 1

## 2023-10-16 MED ORDER — METOPROLOL SUCCINATE ER 25 MG PO TB24
12.5000 mg | ORAL_TABLET | Freq: Every day | ORAL | Status: DC
  Administered 2023-10-16: 12.5 mg via ORAL
  Filled 2023-10-16: qty 1

## 2023-10-16 MED ORDER — IPRATROPIUM-ALBUTEROL 0.5-2.5 (3) MG/3ML IN SOLN: 3.0000 mL | RESPIRATORY_TRACT | Status: DC | PRN

## 2023-10-16 MED ORDER — LEVOFLOXACIN IN D5W 750 MG/150ML IV SOLN
750.0000 mg | Freq: Once | INTRAVENOUS | Status: AC
  Administered 2023-10-16: 750 mg via INTRAVENOUS
  Filled 2023-10-16: qty 150

## 2023-10-16 MED ORDER — IPRATROPIUM-ALBUTEROL 0.5-2.5 (3) MG/3ML IN SOLN
9.0000 mL | Freq: Once | RESPIRATORY_TRACT | Status: AC
  Administered 2023-10-16: 9 mL via RESPIRATORY_TRACT
  Filled 2023-10-16: qty 3

## 2023-10-16 MED ORDER — PREDNISONE 20 MG PO TABS
50.0000 mg | ORAL_TABLET | Freq: Every day | ORAL | Status: AC
  Administered 2023-10-17 – 2023-10-20 (×4): 50 mg via ORAL
  Filled 2023-10-16 (×4): qty 1

## 2023-10-16 NOTE — ED Notes (Signed)
 CCMD called.

## 2023-10-16 NOTE — ED Triage Notes (Addendum)
 Pt to ED with c/o SOB with exertion for several days. Pt c/o generalized weakness and heaviness in the chest. Pt denies pain anywhere. Pt A&Ox4. Pt speaking in clear, full sentences. Pt given 5mg  albuterol  and 0.5mg  atrovent pta.

## 2023-10-16 NOTE — Assessment & Plan Note (Addendum)
 Persistent increased WOB, cough and wheezing despite outpatient course of prednisone , azithromycin, and home inhalers   No overt hypoxia today though w/ persistent increased WOB  CXR stable  Will place on course on IV steroids, IV levaquin , duonebs  Blood gas x 1 Covid, flu and RSV negative  Check formal resp panel  Supplemental O2 prn  Discussed smoking cessation  Monitor
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
## 2023-10-16 NOTE — Assessment & Plan Note (Signed)
 PPI

## 2023-10-16 NOTE — Assessment & Plan Note (Addendum)
 Baseline hx/o CAD s/p stenting 2015 (DES to RCA) Followed by Dr. Delford- last seen 05/2022  No active CP  Cont home regimen including aspirin , lipitor , toprol 

## 2023-10-16 NOTE — ED Notes (Signed)
 SpO2 ambulation trial completed with pt. Pt maintained 98-100% SpO2 while ambulating.

## 2023-10-16 NOTE — Assessment & Plan Note (Signed)
 1/4 PPD smoker  Discussed cessation at length  Nicotine  patch  Monitor
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
## 2023-10-16 NOTE — ED Notes (Signed)
Pt given cup of coffee per request.

## 2023-10-16 NOTE — H&P (Signed)
 History and Physical    Patient: Connor Cuevas FMW:[id number] DOB: [birth year] DOA: 10/16/2023 DOS: the patient was seen and examined on 10/16/2023 PCP: Seabron Lenis, MD  Patient coming from: Home  Chief Complaint:  Chief Complaint  Patient presents with   Shortness of Breath   HPI: Connor Cuevas is a 83 y.o. male with medical history significant of COPD, coronary artery disease status post stenting, tobacco abuse, hyperlipidemia, interstitial lung disease, partial blindness presenting with COPD exacerbation.  Patient reports increased work of breathing with past 5 to 7 days.  Noted cough wheezing increased.  Reduction at home.  Was using home inhalers with no improvement.  Saw PCP.  Was placed on course of prednisone  as well as azithromycin.  Had short course of improvement though symptoms persisted.  Patient reports still smoking 1/4 pack/day despite symptoms.  No fevers or chills.  Minimal chest pain.  No abdominal pain.  No nausea or vomiting.  No reported orthopnea or PND.  Has increased use of home inhalers with minimal improvement in symptoms especially over the past 24 hours.  Patient states he has not smoked cigarettes in the last 24 hours also.  No focal hemiparesis or confusion. Presented to the ER afebrile, hemodynamically stable.  Satting well on room air.  White count 9.7, hemoglobin 16, platelets 231, troponin 7.  Creatinine 0.94.  EKG normal sinus rhythm.  Chest x-ray with noted emphysematous changes and chronic RUL lung nodule. Review of Systems: As mentioned in the history of present illness. All other systems reviewed and are negative. Past Medical History:  Diagnosis Date   AAA (abdominal aortic aneurysm) (HCC) 04/16/2018   4.3 cm, noted on US     Acute kidney failure (HCC) 2005 approx   after ureteroscopy, hospitalized for about 10 days afterwards   Anxiety    Arthritis    Hands, wrist, elbow, back and spine oa   Bladder mass    Blind right eye since 07-2019   Bulging  lumbar disc    L4-L5 degeneration, Chronic back pain   CAD (coronary artery disease)    a. 01/09/14: [place]  s/p DES to RCA    COPD (chronic obstructive pulmonary disease) (HCC)    wheezing   Dyspnea    with exertion   Glaucoma of both eyes    Hepatic cyst 2013   Several tiny hepatic cysts , noted on CT   History of kidney stones    small kidney stones in right kidney   Hyperlipidemia    Interstitial lung disease (HCC)    managed by pcp dr shelah swayne   Pneumonia yrs ago   Pulmonary nodule, right 08/02/2016   a. 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 Aortosclerosis, bilateral - mild with calcification. Per Dr. Seabron at [facility]    Tobacco abuse    Wears dentures    upper    Wears glasses    Past Surgical History:  Procedure Laterality Date   ABDOMINAL AORTIC ENDOVASCULAR STENT GRAFT  06/21/2020   Procedure: ABDOMINAL AORTIC ENDOVASCULAR STENT GRAFT;  Surgeon: Gretta Lonni PARAS, MD;  Location: [facility] OR;  Service: Vascular;;   BIOPSY  01/18/2021   Procedure: BIOPSY;  Surgeon: Dianna Specking, MD;  Location: [facility];  Service: Endoscopy;;  EGD and COLON   CARDIAC CATHETERIZATION  2017  right carotid stent   CATARACT EXTRACTION W/ INTRAOCULAR LENS  IMPLANT, BILATERAL  1997 (APPROX)   CATARACT EXTRACTION W/PHACO Left 03/31/2020   Procedure: CATARACT EXTRACTION PHACO AND INTRAOCULAR LENS PLACEMENT (IOC) LEFT 5.51 01:16.1 7.2%;  Surgeon: Mittie Gaskin, MD;  Location: [facility];  Service: Ophthalmology;  Laterality: Left;   COLONOSCOPY  2017   COLONOSCOPY WITH PROPOFOL  N/A 01/18/2021   Procedure: COLONOSCOPY WITH PROPOFOL ;  Surgeon: Dianna Specking, MD;  Location: [facility];  Service: Endoscopy;  Laterality: N/A;   CYSTOSCOPY W/ URETERAL STENT PLACEMENT Bilateral 08/13/2020   Procedure: CYSTOSCOPY WITH BILATERAL  RETROGRADE PYELOGRAM;  Surgeon: Selma Donnice SAUNDERS, MD;  Location: [facility];  Service: Urology;  Laterality: Bilateral;   CYSTOSCOPY/RETROGRADE/URETEROSCOPY/STONE EXTRACTION WITH BASKET  10/17/2011   Procedure: CYSTOSCOPY/RETROGRADE/URETEROSCOPY/STONE EXTRACTION WITH BASKET;  Surgeon: Thomasine Oiler, MD;  Location: [facility];  Service: Urology;  Laterality: Right;   CYSTOSCOPY/URETEROSCOPY/HOLMIUM LASER/STENT PLACEMENT Right 05/13/2018   Procedure: CYSTOSCOPY/RETROGRADE/URETEROSCOPY/HOLMIUM LASER;  Surgeon: Ottelin, Mark, MD;  Location: [facility];  Service: Urology;  Laterality: Right;   ESOPHAGOGASTRODUODENOSCOPY (EGD) WITH PROPOFOL  N/A 01/18/2021   Procedure: ESOPHAGOGASTRODUODENOSCOPY (EGD) WITH PROPOFOL ;  Surgeon: Dianna Specking, MD;  Location: [facility];  Service: Endoscopy;  Laterality: N/A;   HERNIA REPAIR  15 yrs ago   bil inguinal   LAPAROSCOPIC LEFT INGUINAL (OBTURATOR) HERNIA AND UMBILICAL HERNIA REPAIR  01-13-2011   LEFT HEART CATHETERIZATION WITH CORONARY ANGIOGRAM N/A 01/09/2014   Procedure: LEFT HEART CATHETERIZATION WITH CORONARY ANGIOGRAM;  Surgeon: Maude JAYSON Emmer, MD;  Location: [facility];  Service: Cardiovascular;  Laterality: N/A;   LEFT URETEROSCOPIC STONE EXTRACTION  11-30-2000   W/ PREVIOUS ESWL AND URETERAL STENT PLACEMENT   PHOTOCOAGULATION Right 12/10/2019   Procedure: TRANSCLEARAL DIODE CYCLOPHOTOCOAGULATION RIGHT;  Surgeon: Mittie Gaskin, MD;  Location: [facility];  Service: Ophthalmology;  Laterality: Right;   REPAIR RIGHT INGUINAL HERNIA W/ MESH  12-31-2000   SUBMUCOSAL TATTOO INJECTION  01/18/2021   Procedure: SUBMUCOSAL TATTOO INJECTION;  Surgeon: Dianna Specking, MD;  Location: [facility];  Service: Endoscopy;;   TONSILLECTOMY  age 74   TRANSURETHRAL RESECTION OF BLADDER TUMOR N/A 08/13/2020   Procedure: TRANSURETHRAL RESECTION OF BLADDER TUMOR (TURBT) WITH POSTOPERATIVE INSTILLATION OF  GEMCITABINE ;  Surgeon: Selma Donnice SAUNDERS, MD;  Location: [facility];  Service: Urology;  Laterality: N/A;  GENERAL ANESTHESIA WITH INTUBATED, PARALYSIS   ULTRASOUND GUIDANCE FOR VASCULAR ACCESS Bilateral 06/21/2020   Procedure: ULTRASOUND GUIDANCE FOR VASCULAR ACCESS;  Surgeon: Gretta Lonni PARAS, MD;  Location: [facility] OR;  Service: Vascular;  Laterality: Bilateral;   URETEROLITHOTOMY  2009   Social History:  reports that he quit smoking about 5 years ago. His smoking use included cigarettes. He started smoking about 58 years ago. He has a 53 pack-year smoking history. He has never used smokeless tobacco. He reports that he does not drink alcohol and does not use drugs.  Allergies  Allergen Reactions   Cefaclor Anaphylaxis, Rash and Swelling    Throat closed   Nitrofurantoin Anaphylaxis    Right kidney shut down macrobid   Fluticasone     Other Reaction(s): nose bleeds   Nsaids Other (See Comments)    Due to blood thinner and asa use  Other Reaction(s): make blood to thin   Rosuvastatin  Other (See Comments)   Varenicline Other (See Comments)    nightmares  Other Reaction(s): nightmares, depression    Family History  Problem Relation Age of Onset   Hypertension Mother  Heart attack Other        uncle   Stroke Other    Stroke Maternal Grandmother    Stroke Maternal Grandfather    Stroke Paternal Grandmother    Stroke Paternal Grandfather     Prior to Admission medications   Medication Sig Start Date End Date Taking? Authorizing Provider  amLODipine  (NORVASC ) 2.5 MG tablet Take 1 tablet (2.5 mg total) by mouth daily. 12/30/20   Pokhrel, Laxman, MD  ANORO ELLIPTA 62.5-25 MCG/ACT AEPB 1 puff daily. 10/10/22   [provider]  aspirin  EC 81 MG EC tablet Take 1 tablet (81 mg total) by mouth daily at 6 (six) AM. Swallow whole. 06/23/20   Rhyne, Samantha J, PA-C  atorvastatin  (LIPITOR ) 80 MG tablet Take 1 tablet (80 mg total) by mouth every morning. 05/04/20   Nishan,  Peter C, MD  brimonidine  (ALPHAGAN  P) 0.1 % SOLN Place 1 drop into both eyes 2 (two) times daily.    [provider]  cholecalciferol  (VITAMIN D3) 25 MCG (1000 UNIT) tablet Take 1,000 Units by mouth daily.    [provider]  clopidogrel  (PLAVIX ) 75 MG tablet Take 1 tablet (75 mg total) by mouth daily. 08/20/23   Nishan, Peter C, MD  Coenzyme Q10 (CO Q 10) 100 MG CAPS Take 100 mg by mouth daily.    [provider]  Docusate Sodium  (DSS) 100 MG CAPS as needed.    [provider]  dorzolamide -timolol  (COSOPT ) 2-0.5 % ophthalmic solution 1 drop 3 (three) times daily. 10/16/22   [provider]  GARLIC PO Take 1 tablet by mouth daily.    [provider]  latanoprost  (XALATAN ) 0.005 % ophthalmic solution Place 1 drop into both eyes at bedtime.    [provider]  metoprolol  succinate (TOPROL -XL) 25 MG 24 hr tablet Take 0.5 tablets (12.5 mg total) by mouth daily. Patient taking differently: Take 25 mg by mouth daily. 11/21/21   Swinyer, Rosaline HERO, NP  Multiple Vitamin (MULTIVITAMIN) tablet Take 1 tablet by mouth daily.    [provider]  Multiple Vitamins-Minerals (ICAPS AREDS FORMULA PO) Take 1 tablet by mouth 2 (two) times daily.    [provider]  nitroGLYCERIN  (NITROSTAT ) 0.4 MG SL tablet Place 1 tablet (0.4 mg total) under the tongue every 5 (five) minutes as needed for chest pain. 11/21/21   Swinyer, Rosaline HERO, NP  PROAIR  HFA 108 (90 Base) MCG/ACT inhaler Inhale 2 puffs into the lungs every 4 (four) hours as needed for wheezing. 09/16/18   [provider]  pseudoephedrine (SUDAFED) 120 MG 12 hr tablet Take 120 mg by mouth daily as needed for congestion.    [provider]  tamsulosin  (FLOMAX ) 0.4 MG CAPS capsule Take 0.4 mg by mouth daily. 10/12/22   [provider]  traZODone  (DESYREL ) 100 MG tablet Take 100 mg by mouth at bedtime.  03/10/15   [provider]    Physical  Exam: Vitals:   10/16/23 0755 10/16/23 0900 10/16/23 1000 10/16/23 1100  BP: (!) 142/80 (!) 140/80 (!) 153/74   Pulse: 76 66 72   Resp: 20 20 16    Temp: 97.7 F (36.5 C)     TempSrc: Oral     SpO2: 100% 100% 96%   Weight:    58.8 kg  Height:    5' 7 (1.702 m)   Physical Exam Constitutional:      Comments: Underweight    HENT:     Head: Normocephalic and atraumatic.  Mouth/Throat:     Mouth: Mucous membranes are moist.  Eyes:     Pupils: Pupils are equal, round, and reactive to light.  Cardiovascular:     Rate and Rhythm: Normal rate and regular rhythm.  Pulmonary:     Effort: Pulmonary effort is normal.     Breath sounds: Wheezing present.  Abdominal:     General: Bowel sounds are normal.  Musculoskeletal:        General: Normal range of motion.  Skin:    General: Skin is warm.  Neurological:     General: No focal deficit present.     Mental Status: He is alert.  Psychiatric:        Mood and Affect: Mood normal.     Data Reviewed:  There are no new results to review at this time.  DG Chest 2 View CLINICAL DATA:  83 year old male with weakness, shortness of breath, chest heaviness.  EXAM: CHEST - 2 VIEW  COMPARISON:  CTA chest 12/28/2020 and earlier.  FINDINGS: PA and lateral views 0814 hours. Large lung volumes with emphysema confirmed on 2022 CTA. Chronic right upper lobe lung nodule (image #1 arrow), radiographically stable since that time.  Mediastinal contour stable with mild aortic tortuosity. Visualized tracheal air column is within normal limits. No pneumothorax, pulmonary edema, pleural effusion or acute lung opacity.  Partially visible abdominal aortic endograft. Osteopenia. No acute osseous abnormality identified.  IMPRESSION: 1. No acute cardiopulmonary abnormality. 2. Emphysema (ICD10-J43.9). 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  Electronically Signed   By: VEAR Hurst M.D.   On: 10/16/2023 08:42  Lab  Results  Component Value Date   WBC 9.7 10/16/2023   HGB 16.1 10/16/2023   HCT 50.6 10/16/2023   MCV 93.0 10/16/2023   PLT 231 10/16/2023   Last metabolic panel Lab Results  Component Value Date   GLUCOSE 96 10/16/2023   NA 138 10/16/2023   K 4.1 10/16/2023   CL 105 10/16/2023   CO2 24 10/16/2023   BUN 15 10/16/2023   CREATININE 0.94 10/16/2023   GFRNONAA >60 10/16/2023   CALCIUM  9.5 10/16/2023   PROT 7.0 08/02/2020   ALBUMIN 4.5 08/02/2020   LABGLOB 2.3 05/02/2019   AGRATIO 1.8 05/02/2019   BILITOT 0.6 08/02/2020   ALKPHOS 65 08/02/2020   AST 16 08/02/2020   ALT 12 08/02/2020   ANIONGAP 9 10/16/2023    Assessment and Plan: * COPD exacerbation (HCC) Persistent increased WOB, cough and wheezing despite outpatient course of prednisone , azithromycin, and home inhalers   No overt hypoxia today though w/ persistent increased WOB  CXR stable  Will place on course on IV steroids, IV levaquin , duonebs  Blood gas x 1 Covid, flu and RSV negative  Check formal resp panel  Supplemental O2 prn  Discussed smoking cessation  Monitor    GERD (gastroesophageal reflux disease) PPI    Tobacco abuse 1/4 PPD smoker  Discussed cessation at length  Nicotine  patch  Monitor    CAD (coronary artery disease) Baseline hx/o CAD s/p stenting 2015 (DES to RCA) Followed by Dr. Delford- last seen 05/2022  No active CP  Cont home regimen including aspirin , lipitor , toprol         Advance Care Planning:   Code Status: Full Code   Consults: None   Family Communication: No family at the bedside   Severity of Illness: The appropriate patient status for this patient is OBSERVATION. Observation status is judged to be reasonable and necessary in  order to provide the required intensity of service to ensure the patient's safety. The patient's presenting symptoms, physical exam findings, and initial radiographic and laboratory data in the context of their medical condition is felt to place  them at decreased risk for further clinical deterioration. Furthermore, it is anticipated that the patient will be medically stable for discharge from the hospital within 2 midnights of admission.   Author: Elspeth JINNY Masters, MD 10/16/2023 12:08 PM  For on call review [email or web address].

## 2023-10-17 DIAGNOSIS — J441 Chronic obstructive pulmonary disease with (acute) exacerbation: Secondary | ICD-10-CM | POA: Diagnosis not present

## 2023-10-17 LAB — COMPREHENSIVE METABOLIC PANEL WITH GFR
ALT: 13 U/L (ref 0–44)
AST: 17 U/L (ref 15–41)
Albumin: 3.5 g/dL (ref 3.5–5.0)
Alkaline Phosphatase: 58 U/L (ref 38–126)
Anion gap: 13 (ref 5–15)
BUN: 20 mg/dL (ref 8–23)
CO2: 23 mmol/L (ref 22–32)
Calcium: 9.5 mg/dL (ref 8.9–10.3)
Chloride: 104 mmol/L (ref 98–111)
Creatinine, Ser: 1 mg/dL (ref 0.61–1.24)
GFR, Estimated: >60 mL/min (ref >60)
Glucose, Bld: 127 mg/dL — ABNORMAL HIGH (ref 70–99)
Potassium: 4.1 mmol/L (ref 3.5–5.1)
Sodium: 140 mmol/L (ref 135–145)
Total Bilirubin: 0.7 mg/dL (ref 0.0–1.2)
Total Protein: 6.3 g/dL — ABNORMAL LOW (ref 6.5–8.1)

## 2023-10-17 LAB — CBC
HCT: 46.2 % (ref 39.0–52.0)
Hemoglobin: 15 g/dL (ref 13.0–17.0)
MCH: 29 pg (ref 26.0–34.0)
MCHC: 32.5 g/dL (ref 30.0–36.0)
MCV: 89.4 fL (ref 80.0–100.0)
Platelets: 265 K/uL (ref 150–400)
RBC: 5.17 MIL/uL (ref 4.22–5.81)
RDW: 13.3 % (ref 11.5–15.5)
WBC: 8.4 K/uL (ref 4.0–10.5)
nRBC: 0 % (ref 0.0–0.2)

## 2023-10-17 LAB — GROUP A STREP BY PCR: Group A Strep by PCR: NOT DETECTED

## 2023-10-17 MED ORDER — LEVOFLOXACIN 750 MG PO TABS
750.0000 mg | ORAL_TABLET | ORAL | Status: DC
  Administered 2023-10-18: 750 mg via ORAL
  Filled 2023-10-17: qty 1

## 2023-10-17 MED ORDER — AMLODIPINE BESYLATE 5 MG PO TABS
5.0000 mg | ORAL_TABLET | Freq: Every day | ORAL | Status: DC
  Administered 2023-10-17 – 2023-10-20 (×4): 5 mg via ORAL
  Filled 2023-10-17 (×4): qty 1

## 2023-10-17 MED ORDER — BUDESONIDE 0.25 MG/2ML IN SUSP
0.2500 mg | Freq: Two times a day (BID) | RESPIRATORY_TRACT | Status: DC
  Administered 2023-10-17 – 2023-10-20 (×7): 0.25 mg via RESPIRATORY_TRACT
  Filled 2023-10-17 (×7): qty 2

## 2023-10-17 MED ORDER — ATORVASTATIN CALCIUM 80 MG PO TABS
80.0000 mg | ORAL_TABLET | Freq: Every day | ORAL | Status: DC
  Administered 2023-10-17 – 2023-10-20 (×4): 80 mg via ORAL
  Filled 2023-10-17 (×4): qty 1

## 2023-10-17 MED ORDER — CO Q 10 100 MG PO CAPS: 100.0000 mg | ORAL_CAPSULE | Freq: Every day | ORAL | Status: DC

## 2023-10-17 MED ORDER — TRAZODONE HCL 50 MG PO TABS
100.0000 mg | ORAL_TABLET | Freq: Every day | ORAL | Status: DC
  Administered 2023-10-17 – 2023-10-19 (×3): 100 mg via ORAL
  Filled 2023-10-17 (×3): qty 2

## 2023-10-17 MED ORDER — VITAMIN D 25 MCG (1000 UNIT) PO TABS
1000.0000 [IU] | ORAL_TABLET | Freq: Every day | ORAL | Status: DC
  Administered 2023-10-17 – 2023-10-20 (×4): 1000 [IU] via ORAL
  Filled 2023-10-17 (×4): qty 1

## 2023-10-17 MED ORDER — DORZOLAMIDE HCL-TIMOLOL MAL 2-0.5 % OP SOLN
1.0000 [drp] | Freq: Two times a day (BID) | OPHTHALMIC | Status: DC
  Administered 2023-10-17 – 2023-10-20 (×7): 1 [drp] via OPHTHALMIC
  Filled 2023-10-17: qty 10

## 2023-10-17 MED ORDER — NITROGLYCERIN 0.4 MG SL SUBL: 0.4000 mg | SUBLINGUAL_TABLET | SUBLINGUAL | Status: DC | PRN

## 2023-10-17 MED ORDER — TAMSULOSIN HCL 0.4 MG PO CAPS
0.4000 mg | ORAL_CAPSULE | Freq: Every day | ORAL | Status: DC
  Administered 2023-10-17 – 2023-10-20 (×4): 0.4 mg via ORAL
  Filled 2023-10-17 (×4): qty 1

## 2023-10-17 MED ORDER — ADULT MULTIVITAMIN W/MINERALS CH
1.0000 | ORAL_TABLET | Freq: Every day | ORAL | Status: DC
  Administered 2023-10-17 – 2023-10-20 (×4): 1 via ORAL
  Filled 2023-10-17 (×4): qty 1

## 2023-10-17 MED ORDER — BRIMONIDINE TARTRATE 0.2 % OP SOLN
1.0000 [drp] | Freq: Three times a day (TID) | OPHTHALMIC | Status: DC
  Administered 2023-10-17 – 2023-10-20 (×10): 1 [drp] via OPHTHALMIC
  Filled 2023-10-17: qty 5

## 2023-10-17 MED ORDER — METOPROLOL SUCCINATE ER 25 MG PO TB24
12.5000 mg | ORAL_TABLET | Freq: Every day | ORAL | Status: DC
  Administered 2023-10-17 – 2023-10-20 (×4): 12.5 mg via ORAL
  Filled 2023-10-17 (×4): qty 1

## 2023-10-17 NOTE — Plan of Care (Signed)

## 2023-10-17 NOTE — TOC Progression Note (Addendum)
 Transition of Care [facility]) - Progression Note    Patient Details  Name: Connor Cuevas MRN: [id number] Date of Birth: [birth year]  [facility] CM/SW Contact  Rosaline JONELLE Joe, RN Phone Number: 10/17/2023, 10:48 AM  Clinical Narrative:    CM met with the patient at the bedside - patient admitted for COPD exacerbation - currently on RA - patient has Inogen machine at home that was patient's wife's.  Patient is normally independent and lives alone at the home after wife passed away 2.5 years at home.  Patient has cut back on smoking and only smokes 5 cigarettes per day.  Patient's son and daughter-in-law lives in [place], [place].  Patient was provided with [employer] information if patient is interested in moving to [place] area for ALF/ILF closer to the son.  Patient does not have Nebulizer at home at this time.  No other TOC needs  at this time.  Patient will continue to follow the patient for TOC needs.  PT/OT pending evaluation at this time.  Patient plans to return home with family assistance when medically stable for discharge.  Resources provided in the AVS regarding social services for aging and smoking cessation.  Patient states that his family can provide transportation to home when medically stable - patient currently receiving antibiotics, steroids and nebulizer treatments.  Patient remains on RA.        Expected Discharge Plan and Services                                               Social Determinants of Health (SDOH) Interventions SDOH Screenings   Food Insecurity: No Food Insecurity (10/16/2023)  Housing: Low Risk  (10/16/2023)  Transportation Needs: No Transportation Needs (10/16/2023)  Utilities: Not At Risk (10/16/2023)  Social Connections: Moderately Isolated (10/16/2023)  Tobacco Use: Medium Risk (10/16/2023)    Readmission Risk Interventions     No data to display

## 2023-10-17 NOTE — Progress Notes (Signed)
 PROGRESS NOTE  Connor Cuevas FMW:[id number] DOB: 21-Jan-1941 DOA: 10/16/2023 PCP: Seabron Lenis, MD   LOS: 0 days   Brief Narrative / Interim history: 83 year old male with history of COPD, ongoing tobacco use, ILD who comes into the hospital with dyspnea increased wheezing for the past week.  He was using his home inhalers without improvement, received some prednisone  and azithromycin but overall had persistent symptoms and came to the hospital.  He was admitted with COPD exacerbation  Subjective / 24h Interval events: Complains of sinus pressure and postnasal drip.  Also with sore throat  Assesement and Plan: Principal Problem:   COPD exacerbation (HCC) Active Problems:   CAD (coronary artery disease)   Tobacco abuse   GERD (gastroesophageal reflux disease)   Principal problem COPD exacerbation-while slightly better continues to have wheezing, dyspnea and fatigue with just walking to the bathroom.  Continue nebulizers, empiric Levaquin  and steroids - Fortunately he is on room air, will check ambulatory sats when closer to going home  Active problems Tobacco use-determined to quit  History of CAD-with stenting in the past, continue home regimen, no chest pain at this appears to be stable  Essential hypertension-continue home medications with  Scheduled Meds:  amLODipine   5 mg Oral Daily   atorvastatin   80 mg Oral Daily   brimonidine   1 drop Both Eyes TID   budesonide  (PULMICORT ) nebulizer solution  0.25 mg Nebulization BID   cholecalciferol   1,000 Units Oral Daily   dorzolamide -timolol   1 drop Both Eyes BID   enoxaparin  (LOVENOX ) injection  40 mg Subcutaneous Q24H   metoprolol  succinate  12.5 mg Oral Daily   multivitamin with minerals  1 tablet Oral Daily   predniSONE   50 mg Oral Q breakfast   tamsulosin   0.4 mg Oral Daily   traZODone   100 mg Oral QHS   Continuous Infusions:  [START ON 10/18/2023] levofloxacin  (LEVAQUIN ) IV     PRN Meds:.acetaminophen ,  ipratropium-albuterol , nitroGLYCERIN , ondansetron  **OR** ondansetron  (ZOFRAN ) IV  Current Outpatient Medications  Medication Instructions   amLODipine  (NORVASC ) 5 mg, Daily   ANORO ELLIPTA 62.5-25 MCG/ACT AEPB 1 puff, Daily   aspirin  EC 81 mg, Oral, Daily, Swallow whole.   atorvastatin  (LIPITOR ) 80 mg, Oral, BH-each morning   brimonidine  (ALPHAGAN ) 0.2 % ophthalmic solution 1 drop, Both Eyes, 3 times daily   cholecalciferol  (VITAMIN D3) 1,000 Units, Oral, Daily   clopidogrel  (PLAVIX ) 75 mg, Oral, Daily   Co Q 10 100 mg, Daily   Docusate Sodium  (DSS) 100 MG CAPS As needed   dorzolamide -timolol  (COSOPT ) 2-0.5 % ophthalmic solution 1 drop, 3 times daily   fluticasone furoate-vilanterol (BREO ELLIPTA) 100-25 MCG/ACT AEPB 1 puff, Inhalation, Daily   GARLIC PO 1 tablet, Daily   metoprolol  succinate (TOPROL -XL) 12.5 mg, Oral, Daily   Multiple Vitamin (MULTIVITAMIN) tablet 1 tablet, Oral, Daily   Multiple Vitamins-Minerals (ICAPS AREDS FORMULA PO) 1 tablet, Oral, 2 times daily   nitroGLYCERIN  (NITROSTAT ) 0.4 mg, Sublingual, Every 5 min PRN   PROAIR  HFA 108 (90 Base) MCG/ACT inhaler 2 puffs, Inhalation, Every 4 hours PRN   pseudoephedrine (SUDAFED) 120 mg, Daily PRN   tamsulosin  (FLOMAX ) 0.4 mg, Oral, Daily   traZODone  (DESYREL ) 100 mg, Oral, Daily at bedtime    Diet Orders (From admission, onward)     Start     Ordered   10/16/23 1053  Diet heart healthy/carb modified Room service appropriate? Yes; Fluid consistency: Thin  Diet effective now       Question Answer Comment  Diet-HS  Snack? Nothing   Room service appropriate? Yes   Fluid consistency: Thin      10/16/23 1054            DVT prophylaxis: enoxaparin  (LOVENOX ) injection 40 mg Start: 10/16/23 1600   Lab Results  Component Value Date   PLT 265 10/17/2023      Code Status: Full Code  Family Communication: no family at bedside   Status is: Observation The patient will require care spanning > 2 midnights and should  be moved to inpatient because: Persistent symptoms   Level of care: Telemetry Medical  Consultants:   Objective: Vitals:   10/16/23 2355 10/17/23 0241 10/17/23 0805 10/17/23 0911  BP: 127/74 129/73 132/84   Pulse: (!) 106 (!) 101 99 98  Resp: 19 19 18 16   Temp: 98.2 F (36.8 C) 98.1 F (36.7 C) 97.8 F (36.6 C)   TempSrc:   Oral   SpO2: 95% 97% 96% 94%  Weight:      Height:        Intake/Output Summary (Last 24 hours) at 10/17/2023 1009 Last data filed at 10/16/2023 1244 Gross per 24 hour  Intake 148.1 ml  Output --  Net 148.1 ml   Wt Readings from Last 3 Encounters:  10/16/23 58.8 kg  11/13/22 57.3 kg  05/10/22 56.3 kg    Examination:  Constitutional: NAD Eyes: no scleral icterus ENMT: Mucous membranes are moist.  Neck: normal, supple Respiratory: Faint end expiratory wheezing, no crackles Cardiovascular: Regular rate and rhythm, no murmurs / rubs / gallops. No LE edema.  Abdomen: non distended, no tenderness. Bowel sounds positive.  Musculoskeletal: no clubbing / cyanosis.    Data Reviewed: I have independently reviewed following labs and imaging studies   CBC Recent Labs  Lab 10/16/23 0800 10/17/23 0330  WBC 9.7 8.4  HGB 16.1 15.0  HCT 50.6 46.2  PLT 231 265  MCV 93.0 89.4  MCH 29.6 29.0  MCHC 31.8 32.5  RDW 13.5 13.3    Recent Labs  Lab 10/16/23 0800 10/17/23 0330  NA 138 140  K 4.1 4.1  CL 105 104  CO2 24 23  GLUCOSE 96 127*  BUN 15 20  CREATININE 0.94 1.00  CALCIUM  9.5 9.5  AST  --  17  ALT  --  13  ALKPHOS  --  58  BILITOT  --  0.7  ALBUMIN  --  3.5    ------------------------------------------------------------------------------------------------------------------ No results for input(s): CHOL, HDL, LDLCALC, TRIG, CHOLHDL, LDLDIRECT in the last 72 hours.  No results found for: HGBA1C ------------------------------------------------------------------------------------------------------------------ No results  for input(s): TSH, T4TOTAL, T3FREE, THYROIDAB in the last 72 hours.  Invalid input(s): FREET3  Cardiac Enzymes No results for input(s): CKMB, TROPONINI, MYOGLOBIN in the last 168 hours.  Invalid input(s): CK ------------------------------------------------------------------------------------------------------------------ No results found for: BNP  CBG: No results for input(s): GLUCAP in the last 168 hours.  Recent Results (from the past 240 hours)  Respiratory (~20 pathogens) panel by PCR     Status: None   Collection Time: 10/16/23 10:52 AM   Specimen: Nasopharyngeal Swab; Respiratory  Result Value Ref Range Status   Adenovirus NOT DETECTED NOT DETECTED Final   Coronavirus 229E NOT DETECTED NOT DETECTED Final    Comment: (NOTE) The Coronavirus on the Respiratory Panel, DOES NOT test for the novel  Coronavirus (2019 nCoV)    Coronavirus HKU1 NOT DETECTED NOT DETECTED Final   Coronavirus NL63 NOT DETECTED NOT DETECTED Final   Coronavirus OC43 NOT DETECTED NOT DETECTED  Final   Metapneumovirus NOT DETECTED NOT DETECTED Final   Rhinovirus / Enterovirus NOT DETECTED NOT DETECTED Final   Influenza A NOT DETECTED NOT DETECTED Final   Influenza B NOT DETECTED NOT DETECTED Final   Parainfluenza Virus 1 NOT DETECTED NOT DETECTED Final   Parainfluenza Virus 2 NOT DETECTED NOT DETECTED Final   Parainfluenza Virus 3 NOT DETECTED NOT DETECTED Final   Parainfluenza Virus 4 NOT DETECTED NOT DETECTED Final   Respiratory Syncytial Virus NOT DETECTED NOT DETECTED Final   Bordetella pertussis NOT DETECTED NOT DETECTED Final   Bordetella Parapertussis NOT DETECTED NOT DETECTED Final   Chlamydophila pneumoniae NOT DETECTED NOT DETECTED Final   Mycoplasma pneumoniae NOT DETECTED NOT DETECTED Final    Comment: Performed at [facility], [address] [place], [place] [address]     Radiology Studies: No results found.   Nilda Fendt, MD, PhD [facility]  Between 7 am - 7 pm I am available, please contact me via Amion (for emergencies) or Securechat (non urgent messages)  Between 7 pm - 7 am I am not available, please contact night coverage MD/APP via Amion

## 2023-10-17 NOTE — Progress Notes (Signed)
 PHARMACIST - PHYSICIAN COMMUNICATION DR:   Trixie CONCERNING: Antibiotic IV to Oral Route Change Policy  RECOMMENDATION: This patient is receiving levofloxacin  by the intravenous route.  Based on criteria approved by the Pharmacy and Therapeutics Committee, the antibiotic(s) is/are being converted to the equivalent oral dose form(s).   DESCRIPTION: These criteria include: Patient being treated for a respiratory tract infection, urinary tract infection, cellulitis or clostridium difficile associated diarrhea if on metronidazole The patient is not neutropenic and does not exhibit a GI malabsorption state The patient is eating (either orally or via tube) and/or has been taking other orally administered medications for a least 24 hours The patient is improving clinically and has a Tmax < 100.5  If you have questions about this conversion, please contact the Pharmacy Department  []   [phone] )  Connor Cuevas [x]   [phone] )  Connor Cuevas  []   [phone] )  [facility] []   [phone] )  [facility]

## 2023-10-17 NOTE — Progress Notes (Signed)
PHARMACIST - PHYSICIAN ORDER COMMUNICATION  CONCERNING: P&T Medication Policy on Herbal Medications  DESCRIPTION:  This patient's order for:  Co Q 10  has been noted.  This product(s) is classified as an "herbal" or natural product. Due to a lack of definitive safety studies or FDA approval, nonstandard manufacturing practices, plus the potential risk of unknown drug-drug interactions while on inpatient medications, the Pharmacy and Therapeutics Committee does not permit the use of "herbal" or natural products of this type within [place].   ACTION TAKEN: The pharmacy department is unable to verify this order at this time and your patient has been informed of this safety policy. Please reevaluate patient's clinical condition at discharge and address if the herbal or natural product(s) should be resumed at that time.   

## 2023-10-17 NOTE — Evaluation (Signed)
 Occupational Therapy Evaluation Patient Details Name: Connor Cuevas MRN: [id number] DOB: 11/26/1940 Today's Date: 10/17/2023   History of Present Illness   83 y.o. male presents to [facility] 10/16/23 with COPD exacerbation. PMHx: COPD, coronary artery disease status post stenting, tobacco abuse, hyperlipidemia, interstitial lung disease, partial blindness     Clinical Impressions Pt admitted for above, PTA pt reports living alone and being ind with his ADLs/iADLs, no AD needed to ambulate. Pt currently presenting with balance, strength, and activity tolerance deficits. Pt Sp02 stable on RA >94% but reports RPE of 6/10 with OOB activity. He currently ambulates with CGA no AD, drifting R occasionally (could be to R visual deficits) and completing ADLs with CGA to setup A. Educated him on pursed lip breathing, he would benefit from further education on energy conservation strategies if needed. OT to continue following pt acutely to address deficits and progress closer to baseline, no post acute OT anticipated at DC.      If plan is discharge home, recommend the following:   Assistance with cooking/housework     Functional Status Assessment   Patient has had a recent decline in their functional status and demonstrates the ability to make significant improvements in function in a reasonable and predictable amount of time.     Equipment Recommendations   None recommended by OT (Pt has Rec DME)     Recommendations for Other Services         Precautions/Restrictions   Precautions Precautions: Fall Recall of Precautions/Restrictions: Intact Restrictions Weight Bearing Restrictions Per Provider Order: No     Mobility Bed Mobility Overal bed mobility: Modified Independent                  Transfers Overall transfer level: Needs assistance Equipment used: None Transfers: Sit to/from Stand Sit to Stand: Contact guard assist           General transfer comment: would  veer R with ambulation, likes to steady himself with support upon initial STS.      Balance Overall balance assessment: Needs assistance Sitting-balance support: No upper extremity supported, Feet supported Sitting balance-Leahy Scale: Normal Sitting balance - Comments: sitting EOB     Standing balance-Leahy Scale: Fair Standing balance comment: no AD for dynamic balance. Did drift R on a couple of instances with OT while ambulating.                           ADL either performed or assessed with clinical judgement   ADL Overall ADL's : Needs assistance/impaired Eating/Feeding: Independent;Sitting   Grooming: Standing;Contact guard assist   Upper Body Bathing: Contact guard assist;Standing   Lower Body Bathing: Set up;Sitting/lateral leans   Upper Body Dressing : Standing;Set up Upper Body Dressing Details (indicate cue type and reason): don gown like jacket Lower Body Dressing: Sitting/lateral leans;Set up   Toilet Transfer: Contact guard assist;Ambulation   Toileting- Clothing Manipulation and Hygiene: Contact guard assist;Sit to/from stand       Functional mobility during ADLs: Contact guard assist General ADL Comments: Educated pt on pursed lip breathing, RPE 6/10 with ambulation     Vision Baseline Vision/History:  (R eye blindness) Additional Comments: 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     Perception Perception: Not tested       Praxis Praxis: [facility]       Pertinent Vitals/Pain Pain Assessment Pain Assessment: Faces Faces Pain Scale: No hurt Pain Intervention(s): Monitored during session  Extremity/Trunk Assessment Upper Extremity Assessment Upper Extremity Assessment: Generalized weakness   Lower Extremity Assessment Lower Extremity Assessment: Defer to PT evaluation       Communication Communication Communication: No apparent difficulties   Cognition   Behavior During Therapy: WFL for tasks assessed/performed Cognition: No apparent  impairments                               Following commands: Intact       Cueing  General Comments   Cueing Techniques: Verbal cues  HR 114 bpm with activity. See sepearate note for amb O2.   Exercises     Shoulder Instructions      Home Living Family/patient expects to be discharged to:: Private residence Living Arrangements: Alone Available Help at Discharge: Friend(s);Available PRN/intermittently Type of Home: House Home Access: Stairs to enter [employer] of Steps: 6 through back entrance Entrance Stairs-Rails: Can reach both;Right;Left Home Layout: One level     Bathroom Shower/Tub: Chief Strategy Officer: Standard     Home Equipment: Grab bars - tub/shower;Rolling Environmental consultant (2 wheels);Cane - single point;Wheelchair - manual;BSC/3in1;Rollator (4 wheels)   Additional Comments: Denies any falls.      Prior Functioning/Environment Prior Level of Function : Independent/Modified Independent;Driving             Mobility Comments: ind no AD ADLs Comments: ind    OT Problem List: Cardiopulmonary status limiting activity;Decreased strength;Impaired balance (sitting and/or standing)   OT Treatment/Interventions: Self-care/ADL training;Therapeutic exercise;Patient/family education;Visual/perceptual remediation/compensation;Balance training;Therapeutic activities      OT Goals(Current goals can be found in the care plan section)   Acute Rehab OT Goals Patient Stated Goal: To go home; breathe better and get stronger OT Goal Formulation: With patient Time For Goal Achievement: 10/31/23 Potential to Achieve Goals: Good ADL Goals Pt Will Perform Grooming: with modified independence;standing Pt Will Transfer to Toilet: with modified independence;ambulating Additional ADL Goal #1: Pt will demonstrate independent use of energy conservation strategies prn to complete ADLs.   OT Frequency:  Min 2X/week    Co-evaluation               AM-PAC OT 6 Clicks Daily Activity     Outcome Measure Help from another person eating meals?: None Help from another person taking care of personal grooming?: A Little Help from another person toileting, which includes using toliet, bedpan, or urinal?: A Little Help from another person bathing (including washing, rinsing, drying)?: A Little Help from another person to put on and taking off regular upper body clothing?: A Little Help from another person to put on and taking off regular lower body clothing?: A Little 6 Click Score: 19   End of Session Equipment Utilized During Treatment: Gait belt Nurse Communication: Mobility status  Activity Tolerance: Patient tolerated treatment well Patient left: in bed;with call bell/phone within reach  OT Visit Diagnosis: Unsteadiness on feet (R26.81);Other (comment);Muscle weakness (generalized) (M62.81) (SOB)                Time: 9054-9040 OT Time Calculation (min): 14 min Charges:  OT General Charges $OT Visit: 1 Visit OT Evaluation $OT Eval Low Complexity: 1 Low  10/17/2023  AB, OTR/L  Acute Rehabilitation Services  Office: [phone]   Curtistine JONETTA Das 10/17/2023, 11:13 AM

## 2023-10-17 NOTE — Evaluation (Signed)
 Physical Therapy Evaluation Patient Details Name: Connor Cuevas MRN: [id number] DOB: [birth year] Today's Date: 10/17/2023  History of Present Illness  83 y.o. male presents to [facility] 10/16/23 with COPD exacerbation. PMHx: COPD, coronary artery disease status post stenting, tobacco abuse, hyperlipidemia, interstitial lung disease, partial blindness  Clinical Impression  Pt in bed upon arrival and agreeable to PT eval. PTA, pt was independent for mobility with no AD. Pt reported having progressive vision loss in R>L eye which can make it difficult to see obstacles on the right. Pt reported rotating his head/body frequently when walking for safety. In today's session, pt was CGA/supervision for mobility with no AD. Pt scored a 20/24 on the DGI indicating pt is not at an increased risk of falling. Pt has intermittent level of assist available at home. Anticipating pt will have no post-acute PT needs as pt is close to functional mobility baseline. Pt would benefit from acute skilled PT with current functional limitations listed below (see PT Problem List). Acute PT to follow.         If plan is discharge home, recommend the following: Assist for transportation;Help with stairs or ramp for entrance   Can travel by private vehicle    Yes    Equipment Recommendations None recommended by PT     Functional Status Assessment Patient has had a recent decline in their functional status and demonstrates the ability to make significant improvements in function in a reasonable and predictable amount of time.     Precautions / Restrictions Precautions Precautions: Fall Recall of Precautions/Restrictions: Intact Precaution/Restrictions Comments: partial blindness R>L eye Restrictions Weight Bearing Restrictions Per Provider Order: No      Mobility  Bed Mobility Overal bed mobility: Modified Independent     Transfers Overall transfer level: Needs assistance Equipment used: None Transfers: Sit  to/from Stand Sit to Stand: Supervision           General transfer comment: supervision for safety    Ambulation/Gait Ambulation/Gait assistance: Supervision, Contact guard assist Gait Distance (Feet): 250 Feet Assistive device: None Gait Pattern/deviations: Step-through pattern, Decreased stride length Gait velocity: decr     General Gait Details: steady with no AD, reported having difficulty seeing obstacles on R side with pt needing to turn head     Balance Overall balance assessment: Needs assistance Sitting-balance support: No upper extremity supported, Feet supported Sitting balance-Leahy Scale: Normal     Standing balance support: No upper extremity supported Standing balance-Leahy Scale: Fair      Standardized Balance Assessment Standardized Balance Assessment : Dynamic Gait Index   Dynamic Gait Index Level Surface: Mild Impairment Change in Gait Speed: Mild Impairment Gait with Horizontal Head Turns: Normal Gait with Vertical Head Turns: Normal Gait and Pivot Turn: Normal Step Over Obstacle: Mild Impairment Step Around Obstacles: Normal Steps: Mild Impairment Total Score: 20       Pertinent Vitals/Pain Pain Assessment Pain Assessment: No/denies pain    Home Living Family/patient expects to be discharged to:: Private residence Living Arrangements: Alone Available Help at Discharge: Friend(s);Available PRN/intermittently Type of Home: House Home Access: Stairs to enter Entrance Stairs-Rails: Can reach both;Right;Left Entrance Stairs-Number of Steps: 6 through back entrance   Home Layout: One level Home Equipment: Grab bars - tub/shower;Rolling Walker (2 wheels);Cane - single point;Wheelchair - manual;BSC/3in1;Rollator (4 wheels) Additional Comments: Denies any falls.    Prior Function Prior Level of Function : Independent/Modified Independent;Driving    Mobility Comments: ind no AD ADLs Comments: ind     Extremity/Trunk  Assessment   Upper  Extremity Assessment Upper Extremity Assessment: Defer to OT evaluation    Lower Extremity Assessment Lower Extremity Assessment: Generalized weakness    Cervical / Trunk Assessment Cervical / Trunk Assessment: Normal  Communication   Communication Communication: No apparent difficulties    Cognition Arousal: Alert Behavior During Therapy: WFL for tasks assessed/performed   PT - Cognitive impairments: No apparent impairments    Following commands: Intact       Cueing Cueing Techniques: Verbal cues     General Comments General comments (skin integrity, edema, etc.): HR 114 bpm with activity. 02 94-97% on RA with activity     PT Assessment Patient needs continued PT services  PT Problem List Decreased strength;Decreased activity tolerance;Decreased balance;Decreased mobility       PT Treatment Interventions DME instruction;Gait training;Stair training;Functional mobility training;Therapeutic exercise;Therapeutic activities;Balance training;Neuromuscular re-education;Patient/family education    PT Goals (Current goals can be found in the Care Plan section)  Acute Rehab PT Goals Patient Stated Goal: to feel better and go home PT Goal Formulation: With patient Time For Goal Achievement: 10/31/23 Potential to Achieve Goals: Good    Frequency Min 2X/week        AM-PAC PT 6 Clicks Mobility  Outcome Measure Help needed turning from your back to your side while in a flat bed without using bedrails?: None Help needed moving from lying on your back to sitting on the side of a flat bed without using bedrails?: A Little Help needed moving to and from a bed to a chair (including a wheelchair)?: A Little Help needed standing up from a chair using your arms (e.g., wheelchair or bedside chair)?: A Little Help needed to walk in hospital room?: A Little Help needed climbing 3-5 steps with a railing? : A Little 6 Click Score: 19    End of Session   Activity Tolerance:  Patient tolerated treatment well Patient left: Other (comment) (in bathroom) Nurse Communication: Mobility status PT Visit Diagnosis: Other abnormalities of gait and mobility (R26.89);Muscle weakness (generalized) (M62.81)    Time: 1040-1053 PT Time Calculation (min) (ACUTE ONLY): 13 min   Charges:   PT Evaluation $PT Eval Low Complexity: 1 Low   PT General Charges $$ ACUTE PT VISIT: 1 Visit         Kate ORN, PT, DPT Secure Chat Preferred  Rehab Office [phone]   Kate BRAVO Wendolyn 10/17/2023, 11:22 AM

## 2023-10-17 NOTE — Care Management Obs Status (Signed)
 MEDICARE OBSERVATION STATUS NOTIFICATION   Patient Details  Name: Connor Cuevas MRN: [id number] Date of Birth: [birth year]   Medicare Observation Status Notification Given:  Yes Letter signed and copy given.    Azha Constantin 10/17/2023, 10:34 AM

## 2023-10-18 DIAGNOSIS — J441 Chronic obstructive pulmonary disease with (acute) exacerbation: Secondary | ICD-10-CM | POA: Diagnosis not present

## 2023-10-18 LAB — CBC
HCT: 46.2 % (ref 39.0–52.0)
Hemoglobin: 15.2 g/dL (ref 13.0–17.0)
MCH: 29.2 pg (ref 26.0–34.0)
MCHC: 32.9 g/dL (ref 30.0–36.0)
MCV: 88.8 fL (ref 80.0–100.0)
Platelets: 275 K/uL (ref 150–400)
RBC: 5.2 MIL/uL (ref 4.22–5.81)
RDW: 13.5 % (ref 11.5–15.5)
WBC: 16.2 K/uL — ABNORMAL HIGH (ref 4.0–10.5)
nRBC: 0 % (ref 0.0–0.2)

## 2023-10-18 LAB — COMPREHENSIVE METABOLIC PANEL WITH GFR
ALT: 15 U/L (ref 0–44)
AST: 23 U/L (ref 15–41)
Albumin: 3.6 g/dL (ref 3.5–5.0)
Alkaline Phosphatase: 53 U/L (ref 38–126)
Anion gap: 10 (ref 5–15)
BUN: 28 mg/dL — ABNORMAL HIGH (ref 8–23)
CO2: 23 mmol/L (ref 22–32)
Calcium: 9.2 mg/dL (ref 8.9–10.3)
Chloride: 105 mmol/L (ref 98–111)
Creatinine, Ser: 0.89 mg/dL (ref 0.61–1.24)
GFR, Estimated: >60 mL/min (ref >60)
Glucose, Bld: 111 mg/dL — ABNORMAL HIGH (ref 70–99)
Potassium: 3.8 mmol/L (ref 3.5–5.1)
Sodium: 138 mmol/L (ref 135–145)
Total Bilirubin: 0.9 mg/dL (ref 0.0–1.2)
Total Protein: 6.3 g/dL — ABNORMAL LOW (ref 6.5–8.1)

## 2023-10-18 LAB — MAGNESIUM: Magnesium: 2.3 mg/dL (ref 1.7–2.4)

## 2023-10-18 MED ORDER — LEVOFLOXACIN 750 MG PO TABS
750.0000 mg | ORAL_TABLET | Freq: Every day | ORAL | Status: AC
  Administered 2023-10-19 – 2023-10-20 (×2): 750 mg via ORAL
  Filled 2023-10-18 (×2): qty 1

## 2023-10-18 MED ORDER — ARFORMOTEROL TARTRATE 15 MCG/2ML IN NEBU
15.0000 ug | INHALATION_SOLUTION | Freq: Two times a day (BID) | RESPIRATORY_TRACT | Status: DC
  Administered 2023-10-18 – 2023-10-20 (×5): 15 ug via RESPIRATORY_TRACT
  Filled 2023-10-18 (×5): qty 2

## 2023-10-18 NOTE — Evaluation (Deleted)
 Occupational Therapy Evaluation Patient Details Name: Connor Cuevas MRN: [id number] DOB: 1940-12-21 Today's Date: 10/18/2023   History of Present Illness   83 y.o. male presents to [facility] 10/16/23 with COPD exacerbation. PMHx: COPD, coronary artery disease status post stenting, tobacco abuse, hyperlipidemia, interstitial lung disease, partial blindness     Clinical Impressions Pt progressing well with OT sessions, reports improvements with breathing and less fatigued following mobility compared to IE. Pt making good use of IS and Ocapella devices. Educated pt on compensatory strategies for visual deficits and energy conservation, he is now functioning at a supervision/setup level. Discussed leaving door open during showers for COPD management, he verbalized understanding of reasoning. OT to continue following pt while in acute setting to ensure pt progress closer to baseline. No post acute OT rec. Decreased freq as pt progressing closer to baseline.      If plan is discharge home, recommend the following:   Assistance with cooking/housework     Functional Status Assessment   Patient has had a recent decline in their functional status and demonstrates the ability to make significant improvements in function in a reasonable and predictable amount of time.     Equipment Recommendations   None recommended by OT (Pt has rec DME)     Recommendations for Other Services         Precautions/Restrictions   Precautions Precautions: Fall Recall of Precautions/Restrictions: Intact Precaution/Restrictions Comments: partial blindness R>L eye Restrictions Weight Bearing Restrictions Per Provider Order: No     Mobility Bed Mobility Overal bed mobility: Modified Independent                  Transfers Overall transfer level: Needs assistance Equipment used: None Transfers: Sit to/from Stand Sit to Stand: Supervision                  Balance Overall balance  assessment: Needs assistance Sitting-balance support: No upper extremity supported, Feet supported Sitting balance-Leahy Scale: Normal     Standing balance support: No upper extremity supported Standing balance-Leahy Scale: Fair                             ADL either performed or assessed with clinical judgement   ADL       Grooming: Standing;Supervision/safety                   Toilet Transfer: Ambulation;Supervision/safety   Toileting- Architect and Hygiene: Sit to/from stand;Supervision/safety       Functional mobility during ADLs: Supervision/safety General ADL Comments: Reinforced scanning strategies with pt to compensate for visual deficits, discussed compensatory strategies and DME benefits in the future if needed if COPD progresses. Educated pt on the 4ps of energy conservation.     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     Perception         Praxis         Pertinent Vitals/Pain Pain Assessment Pain Assessment: No/denies pain     Extremity/Trunk Assessment Upper Extremity Assessment Upper Extremity Assessment: Overall WFL for tasks assessed   Lower Extremity Assessment Lower Extremity Assessment: Generalized weakness   Cervical / Trunk Assessment Cervical / Trunk Assessment: Normal   Communication Communication Communication: No apparent difficulties   Cognition Arousal: Alert Behavior During Therapy: WFL for tasks assessed/performed Cognition:  No apparent impairments                               Following commands: Intact       Cueing  General Comments   Cueing Techniques: Verbal cues  VSS RA. Pt pulling on IS.   Exercises     Shoulder Instructions      Home Living                                          Prior  Functioning/Environment                      OT Problem List: Cardiopulmonary status limiting activity;Decreased strength;Impaired balance (sitting and/or standing);Decreased activity tolerance   OT Treatment/Interventions: Self-care/ADL training;Therapeutic exercise;Patient/family education;Visual/perceptual remediation/compensation;Balance training;Therapeutic activities      OT Goals(Current goals can be found in the care plan section)   Acute Rehab OT Goals Patient Stated Goal: To go home; breathe better and get stronger OT Goal Formulation: With patient Time For Goal Achievement: 10/31/23 Potential to Achieve Goals: Good   OT Frequency:  Min 1X/week    Co-evaluation              AM-PAC OT 6 Clicks Daily Activity     Outcome Measure Help from another person eating meals?: None Help from another person taking care of personal grooming?: A Little Help from another person toileting, which includes using toliet, bedpan, or urinal?: A Little Help from another person bathing (including washing, rinsing, drying)?: A Little Help from another person to put on and taking off regular upper body clothing?: A Little Help from another person to put on and taking off regular lower body clothing?: A Little 6 Click Score: 19   End of Session Nurse Communication: Mobility status  Activity Tolerance: Patient tolerated treatment well Patient left: in bed;with call bell/phone within reach;with family/visitor present  OT Visit Diagnosis: Unsteadiness on feet (R26.81);Other (comment);Muscle weakness (generalized) (M62.81)                Time: 8444-8380 OT Time Calculation (min): 24 min Charges:  OT General Charges $OT Visit: 1 Visit OT Treatments $Therapeutic Activity: 23-37 mins  10/18/2023  AB, OTR/L  Acute Rehabilitation Services  Office: [phone]   Curtistine JONETTA Das 10/18/2023, 4:42 PM

## 2023-10-18 NOTE — Progress Notes (Signed)
 PROGRESS NOTE  Connor Cuevas FMW:[id number] DOB: 09-24-1940 DOA: 10/16/2023 PCP: Seabron Lenis, MD   LOS: 0 days   Brief Narrative / Interim history: 83 year old male with history of COPD, ongoing tobacco use, ILD who comes into the hospital with dyspnea increased wheezing for the past week.  He was using his home inhalers without improvement, received some prednisone  and azithromycin but overall had persistent symptoms and came to the hospital.  He was admitted with COPD exacerbation  Subjective / 24h Interval events: Feels like he has got increased congestion this morning, and increased wheezing.  Assesement and Plan: Principal Problem:   COPD exacerbation (HCC) Active Problems:   CAD (coronary artery disease)   Tobacco abuse   GERD (gastroesophageal reflux disease)   Principal problem COPD exacerbation-continues to have wheezing, continue nebulizers, empiric Levaquin , steroids.  Add Brovana  - Remains on room air  Active problems Tobacco use-determined to quit  History of CAD-with stenting in the past, continue home regimen, no chest pain, this is stable  Essential hypertension-continue home medications as below, blood pressure stable  Scheduled Meds:  amLODipine   5 mg Oral Daily   arformoterol   15 mcg Nebulization BID   atorvastatin   80 mg Oral Daily   brimonidine   1 drop Both Eyes TID   budesonide  (PULMICORT ) nebulizer solution  0.25 mg Nebulization BID   cholecalciferol   1,000 Units Oral Daily   dorzolamide -timolol   1 drop Both Eyes BID   enoxaparin  (LOVENOX ) injection  40 mg Subcutaneous Q24H   [START ON 10/19/2023] levofloxacin   750 mg Oral Daily   metoprolol  succinate  12.5 mg Oral Daily   multivitamin with minerals  1 tablet Oral Daily   predniSONE   50 mg Oral Q breakfast   tamsulosin   0.4 mg Oral Daily   traZODone   100 mg Oral QHS   Continuous Infusions:   PRN Meds:.acetaminophen , ipratropium-albuterol , nitroGLYCERIN , ondansetron  **OR** ondansetron   (ZOFRAN ) IV  Current Outpatient Medications  Medication Instructions   amLODipine  (NORVASC ) 5 mg, Daily   ANORO ELLIPTA 62.5-25 MCG/ACT AEPB 1 puff, Daily   aspirin  EC 81 mg, Oral, Daily, Swallow whole.   atorvastatin  (LIPITOR ) 80 mg, Oral, BH-each morning   brimonidine  (ALPHAGAN ) 0.2 % ophthalmic solution 1 drop, Both Eyes, 3 times daily   cholecalciferol  (VITAMIN D3) 1,000 Units, Oral, Daily   clopidogrel  (PLAVIX ) 75 mg, Oral, Daily   Co Q 10 100 mg, Daily   Docusate Sodium  (DSS) 100 MG CAPS As needed   dorzolamide -timolol  (COSOPT ) 2-0.5 % ophthalmic solution 1 drop, 3 times daily   fluticasone furoate-vilanterol (BREO ELLIPTA) 100-25 MCG/ACT AEPB 1 puff, Inhalation, Daily   GARLIC PO 1 tablet, Daily   metoprolol  succinate (TOPROL -XL) 12.5 mg, Oral, Daily   Multiple Vitamin (MULTIVITAMIN) tablet 1 tablet, Oral, Daily   Multiple Vitamins-Minerals (ICAPS AREDS FORMULA PO) 1 tablet, Oral, 2 times daily   nitroGLYCERIN  (NITROSTAT ) 0.4 mg, Sublingual, Every 5 min PRN   PROAIR  HFA 108 (90 Base) MCG/ACT inhaler 2 puffs, Inhalation, Every 4 hours PRN   pseudoephedrine (SUDAFED) 120 mg, Daily PRN   tamsulosin  (FLOMAX ) 0.4 mg, Oral, Daily   traZODone  (DESYREL ) 100 mg, Oral, Daily at bedtime    Diet Orders (From admission, onward)     Start     Ordered   10/16/23 1053  Diet heart healthy/carb modified Room service appropriate? Yes; Fluid consistency: Thin  Diet effective now       Question Answer Comment  Diet-HS Snack? Nothing   Room service appropriate? Yes   Fluid  consistency: Thin      10/16/23 1054            DVT prophylaxis: enoxaparin  (LOVENOX ) injection 40 mg Start: 10/16/23 1600   Lab Results  Component Value Date   PLT 275 10/18/2023      Code Status: Full Code  Family Communication: no family at bedside   Status is: Observation The patient will require care spanning > 2 midnights and should be moved to inpatient because: Persistent symptoms   Level of  care: Telemetry Medical  Consultants:   Objective: Vitals:   10/17/23 2353 10/18/23 0400 10/18/23 0751 10/18/23 1022  BP: 135/87 (!) 132/90 136/79   Pulse: 86 63 85   Resp: 18 18 19    Temp: 97.9 F (36.6 C) (!) 97.3 F (36.3 C) (!) 97.5 F (36.4 C)   TempSrc:  Oral    SpO2: 96% 96% 94% 96%  Weight:      Height:        Intake/Output Summary (Last 24 hours) at 10/18/2023 1026 Last data filed at 10/18/2023 1006 Gross per 24 hour  Intake 476 ml  Output --  Net 476 ml   Wt Readings from Last 3 Encounters:  10/16/23 58.8 kg  11/13/22 57.3 kg  05/10/22 56.3 kg    Examination:  Constitutional: NAD Eyes: lids and conjunctivae normal, no scleral icterus ENMT: mmm Neck: normal, supple Respiratory: End expiratory wheezing present Cardiovascular: Regular rate and rhythm, no murmurs / rubs / gallops. No LE edema. Abdomen: soft, no distention, no tenderness. Bowel sounds positive.     Data Reviewed: I have independently reviewed following labs and imaging studies   CBC Recent Labs  Lab 10/16/23 0800 10/17/23 0330 10/18/23 0247  WBC 9.7 8.4 16.2*  HGB 16.1 15.0 15.2  HCT 50.6 46.2 46.2  PLT 231 265 275  MCV 93.0 89.4 88.8  MCH 29.6 29.0 29.2  MCHC 31.8 32.5 32.9  RDW 13.5 13.3 13.5    Recent Labs  Lab 10/16/23 0800 10/17/23 0330 10/18/23 0247  NA 138 140 138  K 4.1 4.1 3.8  CL 105 104 105  CO2 24 23 23   GLUCOSE 96 127* 111*  BUN 15 20 28*  CREATININE 0.94 1.00 0.89  CALCIUM  9.5 9.5 9.2  AST  --  17 23  ALT  --  13 15  ALKPHOS  --  58 53  BILITOT  --  0.7 0.9  ALBUMIN  --  3.5 3.6  MG  --   --  2.3    ------------------------------------------------------------------------------------------------------------------ No results for input(s): CHOL, HDL, LDLCALC, TRIG, CHOLHDL, LDLDIRECT in the last 72 hours.  No results found for:  HGBA1C ------------------------------------------------------------------------------------------------------------------ No results for input(s): TSH, T4TOTAL, T3FREE, THYROIDAB in the last 72 hours.  Invalid input(s): FREET3  Cardiac Enzymes No results for input(s): CKMB, TROPONINI, MYOGLOBIN in the last 168 hours.  Invalid input(s): CK ------------------------------------------------------------------------------------------------------------------ No results found for: BNP  CBG: No results for input(s): GLUCAP in the last 168 hours.  Recent Results (from the past 240 hours)  Respiratory (~20 pathogens) panel by PCR     Status: None   Collection Time: 10/16/23 10:52 AM   Specimen: Nasopharyngeal Swab; Respiratory  Result Value Ref Range Status   Adenovirus NOT DETECTED NOT DETECTED Final   Coronavirus 229E NOT DETECTED NOT DETECTED Final    Comment: (NOTE) The Coronavirus on the Respiratory Panel, DOES NOT test for the novel  Coronavirus (2019 nCoV)    Coronavirus HKU1 NOT DETECTED NOT DETECTED Final  Coronavirus NL63 NOT DETECTED NOT DETECTED Final   Coronavirus OC43 NOT DETECTED NOT DETECTED Final   Metapneumovirus NOT DETECTED NOT DETECTED Final   Rhinovirus / Enterovirus NOT DETECTED NOT DETECTED Final   Influenza A NOT DETECTED NOT DETECTED Final   Influenza B NOT DETECTED NOT DETECTED Final   Parainfluenza Virus 1 NOT DETECTED NOT DETECTED Final   Parainfluenza Virus 2 NOT DETECTED NOT DETECTED Final   Parainfluenza Virus 3 NOT DETECTED NOT DETECTED Final   Parainfluenza Virus 4 NOT DETECTED NOT DETECTED Final   Respiratory Syncytial Virus NOT DETECTED NOT DETECTED Final   Bordetella pertussis NOT DETECTED NOT DETECTED Final   Bordetella Parapertussis NOT DETECTED NOT DETECTED Final   Chlamydophila pneumoniae NOT DETECTED NOT DETECTED Final   Mycoplasma pneumoniae NOT DETECTED NOT DETECTED Final    Comment: Performed at [facility]  [facility], [address], [place], [place] [address]  Group A Strep by PCR     Status: None   Collection Time: 10/17/23 10:11 AM   Specimen: Throat; Sterile Swab  Result Value Ref Range Status   Group A Strep by PCR NOT DETECTED NOT DETECTED Final    Comment: Performed at [facility], [address], [place], [place] [address]     Radiology Studies: No results found.   Nilda Fendt, MD, PhD [facility]  Between 7 am - 7 pm I am available, please contact me via Amion (for emergencies) or Securechat (non urgent messages)  Between 7 pm - 7 am I am not available, please contact night coverage MD/APP via Amion

## 2023-10-18 NOTE — Plan of Care (Signed)

## 2023-10-18 NOTE — Progress Notes (Signed)
 PHARMACY NOTE:  ANTIMICROBIAL RENAL DOSAGE ADJUSTMENT  Current antimicrobial regimen includes a mismatch between antimicrobial dosage and estimated renal function.  As per policy approved by the Pharmacy & Therapeutics and Medical Executive Committees, the antimicrobial dosage will be adjusted accordingly.  Current antimicrobial dosage: Levofloxacin  750mg  po Q48H  Indication: COPD exacerbation/respiratory infection  Renal Function: Estimated Creatinine Clearance: 54.1 mL/min (by C-G formula based on SCr of 0.89 mg/dL).    Antimicrobial dosage has been changed to: Levofloxacin  750mg  po Q24H  Additional comments: Dose increased due to improved renal function from SCr of 1 to 0.89.  Thank you for allowing pharmacy to be a part of this patient's care.  [name], PharmD PGY1 Pharmacy Resident [facility]  10/18/2023 9:07 AM

## 2023-10-18 NOTE — Progress Notes (Signed)
 Occupational Therapy Treatment Patient Details Name: Connor Cuevas MRN: [id number] DOB: 1940/07/31 Today's Date: 10/18/2023   History of present illness 83 y.o. male presents to [facility] 10/16/23 with COPD exacerbation. PMHx: COPD, coronary artery disease status post stenting, tobacco abuse, hyperlipidemia, interstitial lung disease, partial blindness   OT comments  Pt progressing well with OT sessions, reports improvements with breathing and less fatigued following mobility compared to IE. Pt making good use of IS and Ocapella devices. Educated pt on compensatory strategies for visual deficits and energy conservation, he is now functioning at a supervision/setup level. Discussed leaving door open during showers for COPD management, he verbalized understanding of reasoning. OT to continue following pt while in acute setting to ensure pt progress closer to baseline. No post acute OT rec. Decreased freq as pt progressing closer to baseline.       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 Restrictions Weight Bearing Restrictions Per Provider Order: No       Mobility Bed Mobility Overal bed mobility: Modified Independent                  Transfers Overall transfer level: Needs assistance Equipment used: None Transfers: Sit to/from Stand Sit to Stand: Supervision                 Balance Overall balance assessment: Needs assistance Sitting-balance support: No upper extremity supported, Feet supported Sitting balance-Leahy Scale: Normal     Standing balance support: No upper extremity supported Standing balance-Leahy Scale: Fair                              ADL either performed or assessed with clinical judgement   ADL       Grooming: Standing;Supervision/safety                   Toilet Transfer: Ambulation;Supervision/safety   Toileting- Architect and Hygiene: Sit to/from stand;Supervision/safety       Functional mobility during ADLs: Supervision/safety General ADL Comments: Reinforced scanning strategies with pt to compensate for visual deficits, discussed compensatory strategies and DME benefits in the future if needed if COPD progresses. Educated pt on the 4ps of energy conservation.    Extremity/Trunk Assessment Upper Extremity Assessment Upper Extremity Assessment: Overall WFL for tasks assessed   Lower Extremity Assessment Lower Extremity Assessment: Generalized weakness   Cervical / Trunk Assessment Cervical / Trunk Assessment: Normal    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   Perception     Praxis     Communication Communication Communication: No apparent difficulties   Cognition Arousal: Alert Behavior During Therapy: WFL for tasks assessed/performed Cognition: No apparent impairments                               Following commands: Intact        Cueing   Cueing Techniques: Verbal cues  Exercises      Shoulder Instructions       General Comments VSS  RA. Pt pulling on IS.    Pertinent Vitals/ Pain       Pain Assessment Pain Assessment: No/denies pain  Home Living                                          Prior Functioning/Environment              Frequency  Min 1X/week        Progress Toward Goals  OT Goals(current goals can now be found in the care plan section)     Acute Rehab OT Goals Patient Stated Goal: To go home; breathe better and get stronger OT Goal  Formulation: With patient Time For Goal Achievement: 10/31/23 Potential to Achieve Goals: Good  Plan      Co-evaluation                 AM-PAC OT 6 Clicks Daily Activity     Outcome Measure   Help from another person eating meals?: None Help from another person taking care of personal grooming?: A Little Help from another person toileting, which includes using toliet, bedpan, or urinal?: A Little Help from another person bathing (including washing, rinsing, drying)?: A Little Help from another person to put on and taking off regular upper body clothing?: A Little Help from another person to put on and taking off regular lower body clothing?: A Little 6 Click Score: 19    End of Session    OT Visit Diagnosis: Unsteadiness on feet (R26.81);Other (comment);Muscle weakness (generalized) (M62.81)   Activity Tolerance Patient tolerated treatment well   Patient Left in bed;with call bell/phone within reach;with family/visitor present   Nurse Communication Mobility status        Time: 8444-8380 OT Time Calculation (min): 24 min  Charges: OT General Charges $OT Visit: 1 Visit OT Treatments $Therapeutic Activity: 23-37 mins  10/18/2023  AB, OTR/L  Acute Rehabilitation Services  Office: [phone]   Curtistine JONETTA Das 10/18/2023, 4:46 PM

## 2023-10-18 NOTE — Progress Notes (Signed)
 Mobility Specialist: Progress Note   10/18/23 1025  Mobility  Activity Ambulated with assistance in hallway  Level of Assistance Standby assist, set-up cues, supervision of patient - no hands on  Assistive Device None  Distance Ambulated (ft) 350 ft  Activity Response Tolerated well  Mobility Referral Yes  Mobility visit 1 Mobility  Mobility Specialist Start Time (ACUTE ONLY) 1000  Mobility Specialist Stop Time (ACUTE ONLY) 1009  Mobility Specialist Time Calculation (min) (ACUTE ONLY) 9 min    Pt received in bed, pleasant and agreeable to mobility session. SV throughout. Slight unsteadiness with two occurrences of R drift, but no overt LOB or physical assist needed to correct. SpO2 93-96% RA. No complaints. Returned to room without fault. Left on EOB with all needs met, call bell in reach.   Ileana Lute Mobility Specialist Please contact via SecureChat or Rehab office at [phone]

## 2023-10-19 DIAGNOSIS — J441 Chronic obstructive pulmonary disease with (acute) exacerbation: Secondary | ICD-10-CM | POA: Diagnosis not present

## 2023-10-19 LAB — GLUCOSE, CAPILLARY: Glucose-Capillary: 111 mg/dL — ABNORMAL HIGH (ref 70–99)

## 2023-10-19 NOTE — Plan of Care (Signed)

## 2023-10-19 NOTE — Progress Notes (Signed)
 Physical Therapy Treatment Patient Details Name: Connor Cuevas MRN: [id number] DOB: 1940-10-19 Today's Date: 10/19/2023   History of Present Illness 83 y.o. male presents to [facility] 10/16/23 with COPD exacerbation. PMHx: COPD, coronary artery disease status post stenting, tobacco abuse, hyperlipidemia, interstitial lung disease, partial blindness    PT Comments  The pt is continuing to demonstrate fairly steady but slow gait. He was able to navigate x10 stairs without LOB as well this date. Educated pt on EC techniques and progressing mobility gradually to tolerance. He verbalized understanding. Will continue to follow acutely.    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 Restrictions Weight Bearing Restrictions Per Provider Order: No     Mobility  Bed Mobility Overal bed mobility: Modified Independent             General bed mobility comments: No assistance needed    Transfers Overall transfer level: Modified independent Equipment used: None Transfers: Sit to/from Stand Sit to Stand: Modified independent (Device/Increase time)           General transfer comment: Slightly increased time to power up to stand, no LOB or assistance needed    Ambulation/Gait Ambulation/Gait assistance: Supervision Gait Distance (Feet): 370 Feet Assistive device: None Gait Pattern/deviations: Step-through pattern, Decreased stride length, Drifts right/left Gait velocity: decr Gait velocity interpretation: 1.31 - 2.62 ft/sec, indicative of limited community ambulator   General Gait Details: Pt drifts laterally  intermittently, but no LOB noted, supervision for safety   Stairs Stairs: Yes Stairs assistance: Supervision Stair Management: One rail Right, One rail Left, Forwards, Step to pattern Number of Stairs: 10 General stair comments: Ascends and descends stairs without LOB, descends slower than ascends, supervision for safety   Wheelchair Mobility     Tilt Bed    Modified Rankin (Stroke Patients Only)       Balance Overall balance assessment: Needs assistance Sitting-balance support: No upper extremity supported, Feet supported Sitting balance-Leahy Scale: Normal     Standing balance support: No upper extremity supported Standing balance-Leahy Scale: Fair                              Hotel manager: No apparent difficulties  Cognition Arousal: Alert Behavior During Therapy: WFL for tasks assessed/performed   PT - Cognitive impairments: No apparent impairments                         Following commands: Intact      Cueing Cueing Techniques: Verbal cues  Exercises      General Comments General comments (skin integrity, edema, etc.): educated pt on EC techniques and progressing mobility gradually to tolerance, he verbalized understanding      Pertinent Vitals/Pain Pain Assessment Pain Assessment: Faces Faces Pain Scale: No hurt Pain Intervention(s): Monitored during session    Home Living                          Prior Function            PT Goals (current goals can  now be found in the care plan section) Acute Rehab PT Goals Patient Stated Goal: to feel better and go home PT Goal Formulation: With patient Time For Goal Achievement: 10/31/23 Potential to Achieve Goals: Good Progress towards PT goals: Progressing toward goals    Frequency    Min 2X/week      PT Plan      Co-evaluation              AM-PAC PT 6 Clicks Mobility   Outcome Measure  Help needed turning from your  back to your side while in a flat bed without using bedrails?: None Help needed moving from lying on your back to sitting on the side of a flat bed without using bedrails?: None Help needed moving to and from a bed to a chair (including a wheelchair)?: None Help needed standing up from a chair using your arms (e.g., wheelchair or bedside chair)?: None Help needed to walk in hospital room?: A Little Help needed climbing 3-5 steps with a railing? : A Little 6 Click Score: 22    End of Session   Activity Tolerance: Patient tolerated treatment well Patient left: in bed;with call bell/phone within reach (no bed alarm on upon arrival, pt reports RN allowing pt up on his own)   PT Visit Diagnosis: Other abnormalities of gait and mobility (R26.89);Muscle weakness (generalized) (M62.81)     Time: 8696-8678 PT Time Calculation (min) (ACUTE ONLY): 18 min  Charges:    $Therapeutic Activity: 8-22 mins PT General Charges $$ ACUTE PT VISIT: 1 Visit                     [name], PT, DPT Acute Rehabilitation Services  Office: [phone]    [name] 10/19/2023, 1:36 PM

## 2023-10-19 NOTE — TOC Progression Note (Addendum)
 Transition of Care [facility]) - Progression Note    Patient Details  Name: Connor Cuevas MRN: [id number] Date of Birth: Jun 13, 1940  [facility] of [facility] CM/SW Contact  Rosaline JONELLE Joe, RN Phone Number: 10/19/2023, 9:18 AM  Clinical Narrative:    CM spoke with attending MD and patient continues to have SOB, wheezing requiring breathing treatments.  Plan is for patient to discharge home with the son tomorrow.  I met with the patient at the bedside and offered Medicare choice regarding DME agency. Patient chose [employer].  I called [employer] and ordered nebulizer machine to be delivered to the bedside today for pending discharge home tomorrow with the son by car.  RNCM with IP Care management will continue to follow the patient for needs.  Nebulizer was delivered to the bedside today.        Expected Discharge Plan and Services                                               Social Determinants of Health (SDOH) Interventions SDOH Screenings   Food Insecurity: No Food Insecurity (10/16/2023)  Housing: Low Risk  (10/16/2023)  Transportation Needs: No Transportation Needs (10/16/2023)  Utilities: Not At Risk (10/16/2023)  Social Connections: Moderately Isolated (10/16/2023)  Tobacco Use: Medium Risk (10/16/2023)    Readmission Risk Interventions     No data to display

## 2023-10-19 NOTE — Progress Notes (Signed)
 PROGRESS NOTE  Connor Cuevas FMW:[id number] DOB: 08/17/1940 DOA: 10/16/2023 PCP: Seabron Lenis, MD   LOS: 0 days   Brief Narrative / Interim history: 83 year old male with history of COPD, ongoing tobacco use, ILD who comes into the hospital with dyspnea increased wheezing for the past week.  He was using his home inhalers without improvement, received some prednisone  and azithromycin but overall had persistent symptoms and came to the hospital.  He was admitted with COPD exacerbation  Subjective / 24h Interval events: He appreciates some improvement this morning, still wheezing and congested.  Very concerned about him being able to do all his ADLs and now is planning to live with his son and daughter-in-law, in [place]  Assesement and Plan: Principal Problem:   COPD exacerbation (HCC) Active Problems:   CAD (coronary artery disease)   Tobacco abuse   GERD (gastroesophageal reflux disease)   Principal problem COPD exacerbation-remains with slight wheezing, but overall improved.  Continue empiric Levaquin , steroids, Brovana  - Remains on room air - Ambulate more today, continue I-S, flutter valve, anticipate potential discharge with his son and daughter-in-law tomorrow - He would benefit from having a nebulizer at home, discussed with case manager and will arrange one  Active problems Tobacco use-determined to quit  History of CAD-with stenting in the past, continue home regimen, no chest pain, this is stable  Essential hypertension-continue home medications as below, blood pressure is stable  Scheduled Meds:  amLODipine   5 mg Oral Daily   arformoterol   15 mcg Nebulization BID   atorvastatin   80 mg Oral Daily   brimonidine   1 drop Both Eyes TID   budesonide  (PULMICORT ) nebulizer solution  0.25 mg Nebulization BID   cholecalciferol   1,000 Units Oral Daily   dorzolamide -timolol   1 drop Both Eyes BID   enoxaparin  (LOVENOX ) injection  40 mg Subcutaneous Q24H   levofloxacin   750 mg  Oral Daily   metoprolol  succinate  12.5 mg Oral Daily   multivitamin with minerals  1 tablet Oral Daily   predniSONE   50 mg Oral Q breakfast   tamsulosin   0.4 mg Oral Daily   traZODone   100 mg Oral QHS   Continuous Infusions:   PRN Meds:.acetaminophen , ipratropium-albuterol , nitroGLYCERIN , ondansetron  **OR** ondansetron  (ZOFRAN ) IV  Current Outpatient Medications  Medication Instructions   amLODipine  (NORVASC ) 5 mg, Daily   ANORO ELLIPTA 62.5-25 MCG/ACT AEPB 1 puff, Daily   aspirin  EC 81 mg, Oral, Daily, Swallow whole.   atorvastatin  (LIPITOR ) 80 mg, Oral, BH-each morning   brimonidine  (ALPHAGAN ) 0.2 % ophthalmic solution 1 drop, Both Eyes, 3 times daily   cholecalciferol  (VITAMIN D3) 1,000 Units, Oral, Daily   clopidogrel  (PLAVIX ) 75 mg, Oral, Daily   Co Q 10 100 mg, Daily   Docusate Sodium  (DSS) 100 MG CAPS As needed   dorzolamide -timolol  (COSOPT ) 2-0.5 % ophthalmic solution 1 drop, 3 times daily   fluticasone furoate-vilanterol (BREO ELLIPTA) 100-25 MCG/ACT AEPB 1 puff, Inhalation, Daily   GARLIC PO 1 tablet, Daily   metoprolol  succinate (TOPROL -XL) 12.5 mg, Oral, Daily   Multiple Vitamin (MULTIVITAMIN) tablet 1 tablet, Oral, Daily   Multiple Vitamins-Minerals (ICAPS AREDS FORMULA PO) 1 tablet, Oral, 2 times daily   nitroGLYCERIN  (NITROSTAT ) 0.4 mg, Sublingual, Every 5 min PRN   PROAIR  HFA 108 (90 Base) MCG/ACT inhaler 2 puffs, Inhalation, Every 4 hours PRN   pseudoephedrine (SUDAFED) 120 mg, Daily PRN   tamsulosin  (FLOMAX ) 0.4 mg, Oral, Daily   traZODone  (DESYREL ) 100 mg, Oral, Daily at bedtime  Diet Orders (From admission, onward)     Start     Ordered   10/16/23 1053  Diet heart healthy/carb modified Room service appropriate? Yes; Fluid consistency: Thin  Diet effective now       Question Answer Comment  Diet-HS Snack? Nothing   Room service appropriate? Yes   Fluid consistency: Thin      10/16/23 1054            DVT prophylaxis: enoxaparin  (LOVENOX )  injection 40 mg Start: 10/16/23 1600   Lab Results  Component Value Date   PLT 275 10/18/2023      Code Status: Full Code  Family Communication: no family at bedside   Status is: Observation The patient will require care spanning > 2 midnights and should be moved to inpatient because: Persistent symptoms   Level of care: Telemetry Medical  Consultants:   Objective: Vitals:   10/19/23 0021 10/19/23 0424 10/19/23 0853 10/19/23 0921  BP: 117/75 128/79  110/65  Pulse: 78 66 78 67  Resp: 18 18 17 16   Temp: (!) 97.5 F (36.4 C) 97.8 F (36.6 C)  97.8 F (36.6 C)  TempSrc:  Oral    SpO2: 96% 97% 96% 97%  Weight:      Height:        Intake/Output Summary (Last 24 hours) at 10/19/2023 0940 Last data filed at 10/18/2023 1627 Gross per 24 hour  Intake 536 ml  Output --  Net 536 ml   Wt Readings from Last 3 Encounters:  10/16/23 58.8 kg  11/13/22 57.3 kg  05/10/22 56.3 kg    Examination:  Constitutional: NAD Eyes: lids and conjunctivae normal, no scleral icterus ENMT: mmm Neck: normal, supple Respiratory: Very faint end expiratory wheezing Cardiovascular: Regular rate and rhythm, no murmurs / rubs / gallops. No LE edema. Abdomen: soft, no distention, no tenderness. Bowel sounds positive.    Data Reviewed: I have independently reviewed following labs and imaging studies   CBC Recent Labs  Lab 10/16/23 0800 10/17/23 0330 10/18/23 0247  WBC 9.7 8.4 16.2*  HGB 16.1 15.0 15.2  HCT 50.6 46.2 46.2  PLT 231 265 275  MCV 93.0 89.4 88.8  MCH 29.6 29.0 29.2  MCHC 31.8 32.5 32.9  RDW 13.5 13.3 13.5    Recent Labs  Lab 10/16/23 0800 10/17/23 0330 10/18/23 0247  NA 138 140 138  K 4.1 4.1 3.8  CL 105 104 105  CO2 24 23 23   GLUCOSE 96 127* 111*  BUN 15 20 28*  CREATININE 0.94 1.00 0.89  CALCIUM  9.5 9.5 9.2  AST  --  17 23  ALT  --  13 15  ALKPHOS  --  58 53  BILITOT  --  0.7 0.9  ALBUMIN  --  3.5 3.6  MG  --   --  2.3     ------------------------------------------------------------------------------------------------------------------ No results for input(s): CHOL, HDL, LDLCALC, TRIG, CHOLHDL, LDLDIRECT in the last 72 hours.  No results found for: HGBA1C ------------------------------------------------------------------------------------------------------------------ No results for input(s): TSH, T4TOTAL, T3FREE, THYROIDAB in the last 72 hours.  Invalid input(s): FREET3  Cardiac Enzymes No results for input(s): CKMB, TROPONINI, MYOGLOBIN in the last 168 hours.  Invalid input(s): CK ------------------------------------------------------------------------------------------------------------------ No results found for: BNP  CBG: Recent Labs  Lab 10/19/23 0924  GLUCAP 111*    Recent Results (from the past 240 hours)  Respiratory (~20 pathogens) panel by PCR     Status: None   Collection Time: 10/16/23 10:52 AM   Specimen: Nasopharyngeal  Swab; Respiratory  Result Value Ref Range Status   Adenovirus NOT DETECTED NOT DETECTED Final   Coronavirus 229E NOT DETECTED NOT DETECTED Final    Comment: (NOTE) The Coronavirus on the Respiratory Panel, DOES NOT test for the novel  Coronavirus (2019 nCoV)    Coronavirus HKU1 NOT DETECTED NOT DETECTED Final   Coronavirus NL63 NOT DETECTED NOT DETECTED Final   Coronavirus OC43 NOT DETECTED NOT DETECTED Final   Metapneumovirus NOT DETECTED NOT DETECTED Final   Rhinovirus / Enterovirus NOT DETECTED NOT DETECTED Final   Influenza A NOT DETECTED NOT DETECTED Final   Influenza B NOT DETECTED NOT DETECTED Final   Parainfluenza Virus 1 NOT DETECTED NOT DETECTED Final   Parainfluenza Virus 2 NOT DETECTED NOT DETECTED Final   Parainfluenza Virus 3 NOT DETECTED NOT DETECTED Final   Parainfluenza Virus 4 NOT DETECTED NOT DETECTED Final   Respiratory Syncytial Virus NOT DETECTED NOT DETECTED Final   Bordetella pertussis NOT  DETECTED NOT DETECTED Final   Bordetella Parapertussis NOT DETECTED NOT DETECTED Final   Chlamydophila pneumoniae NOT DETECTED NOT DETECTED Final   Mycoplasma pneumoniae NOT DETECTED NOT DETECTED Final    Comment: Performed at [facility], [address], [place], [place] [address]  Group A Strep by PCR     Status: None   Collection Time: 10/17/23 10:11 AM   Specimen: Throat; Sterile Swab  Result Value Ref Range Status   Group A Strep by PCR NOT DETECTED NOT DETECTED Final    Comment: Performed at [facility], [address], [place], [place] [address]     Radiology Studies: No results found.   Nilda Fendt, MD, PhD [facility]  Between 7 am - 7 pm I am available, please contact me via Amion (for emergencies) or Securechat (non urgent messages)  Between 7 pm - 7 am I am not available, please contact night coverage MD/APP via Amion

## 2023-10-20 ENCOUNTER — Other Ambulatory Visit (HOSPITAL_COMMUNITY): Payer: Self-pay

## 2023-10-20 DIAGNOSIS — J441 Chronic obstructive pulmonary disease with (acute) exacerbation: Secondary | ICD-10-CM | POA: Diagnosis not present

## 2023-10-20 MED ORDER — PREDNISONE 10 MG PO TABS
ORAL_TABLET | ORAL | 0 refills | Status: AC
  Filled 2023-10-20: qty 20, 8d supply, fill #0

## 2023-10-20 MED ORDER — IPRATROPIUM-ALBUTEROL 0.5-2.5 (3) MG/3ML IN SOLN
3.0000 mL | RESPIRATORY_TRACT | 0 refills | Status: AC | PRN
  Filled 2023-10-20: qty 360, 20d supply, fill #0

## 2023-10-20 MED ORDER — LEVOFLOXACIN 750 MG PO TABS
750.0000 mg | ORAL_TABLET | Freq: Every day | ORAL | 0 refills | Status: AC
  Filled 2023-10-20: qty 2, 2d supply, fill #0

## 2023-10-20 NOTE — Plan of Care (Signed)
  Problem: Education: Goal: Knowledge of General Education information will improve Description: Including pain rating scale, medication(s)/side effects and non-pharmacologic comfort measures Outcome: Adequate for Discharge   Problem: Health Behavior/Discharge Planning: Goal: Ability to manage health-related needs will improve Outcome: Adequate for Discharge   Problem: Clinical Measurements: Goal: Ability to maintain clinical measurements within normal limits will improve Outcome: Adequate for Discharge Goal: Will remain free from infection Outcome: Adequate for Discharge Goal: Diagnostic test results will improve Outcome: Adequate for Discharge Goal: Respiratory complications will improve Outcome: Adequate for Discharge Goal: Cardiovascular complication will be avoided Outcome: Adequate for Discharge   Problem: Activity: Goal: Risk for activity intolerance will decrease Outcome: Adequate for Discharge   Problem: Nutrition: Goal: Adequate nutrition will be maintained Outcome: Adequate for Discharge   Problem: Coping: Goal: Level of anxiety will decrease Outcome: Adequate for Discharge   Problem: Elimination: Goal: Will not experience complications related to bowel motility Outcome: Adequate for Discharge Goal: Will not experience complications related to urinary retention Outcome: Adequate for Discharge   Problem: Pain Managment: Goal: General experience of comfort will improve and/or be controlled Outcome: Adequate for Discharge   Problem: Safety: Goal: Ability to remain free from injury will improve Outcome: Adequate for Discharge   Problem: Skin Integrity: Goal: Risk for impaired skin integrity will decrease Outcome: Adequate for Discharge   Problem: Education: Goal: Knowledge of disease or condition will improve Outcome: Adequate for Discharge Goal: Knowledge of the prescribed therapeutic regimen will improve Outcome: Adequate for Discharge Goal:  Individualized Educational Video(s) Outcome: Adequate for Discharge   Problem: Activity: Goal: Ability to tolerate increased activity will improve Outcome: Adequate for Discharge Goal: Will verbalize the importance of balancing activity with adequate rest periods Outcome: Adequate for Discharge   Problem: Respiratory: Goal: Ability to maintain a clear airway will improve Outcome: Adequate for Discharge Goal: Levels of oxygenation will improve Outcome: Adequate for Discharge Goal: Ability to maintain adequate ventilation will improve Outcome: Adequate for Discharge   Problem: Acute Rehab OT Goals (only OT should resolve) Goal: Pt. Will Perform Grooming Outcome: Adequate for Discharge Goal: Pt. Will Transfer To Toilet Outcome: Adequate for Discharge Goal: OT Additional ADL Goal #1 Outcome: Adequate for Discharge   Problem: Acute Rehab PT Goals(only PT should resolve) Goal: Patient Will Transfer Sit To/From Stand Outcome: Adequate for Discharge Goal: Pt Will Transfer Bed To Chair/Chair To Bed Outcome: Adequate for Discharge Goal: Pt Will Ambulate Outcome: Adequate for Discharge Goal: Pt Will Go Up/Down Stairs Outcome: Adequate for Discharge

## 2023-10-20 NOTE — Discharge Instructions (Signed)
 Follow with Seabron Lenis, MD in 1-2 weeks  Please get a complete blood count and chemistry panel checked by your Primary MD at your next visit, and again as instructed by your Primary MD. Please get your medications reviewed and adjusted by your Primary MD.  Please request your Primary MD to go over all Hospital Tests and Procedure/Radiological results at the follow up, please get all Hospital records sent to your Prim MD by signing hospital release before you go home.  In some cases, there will be blood work, cultures and biopsy results pending at the time of your discharge. Please request that your primary care M.D. goes through all the records of your hospital data and follows up on these results.  If you had Pneumonia of Lung problems at the Hospital: Please get a 2 view Chest X ray done in 6-8 weeks after hospital discharge or sooner if instructed by your Primary MD.  If you have Congestive Heart Failure: Please call your Cardiologist or Primary MD anytime you have any of the following symptoms:  1) 3 pound weight gain in 24 hours or 5 pounds in 1 week  2) shortness of breath, with or without a dry hacking cough  3) swelling in the hands, feet or stomach  4) if you have to sleep on extra pillows at night in order to breathe  Follow cardiac low salt diet and 1.5 lit/day fluid restriction.  If you have diabetes Accuchecks 4 times/day, Once in AM empty stomach and then before each meal. Log in all results and show them to your primary doctor at your next visit. If any glucose reading is under 80 or above 300 call your primary MD immediately.  If you have Seizure/Convulsions/Epilepsy: Please do not drive, operate heavy machinery, participate in activities at heights or participate in high speed sports until you have seen by Primary MD or a Neurologist and advised to do so again. Per [place]  DMV statutes, patients with seizures are not allowed to drive until they have been  seizure-free for six months.  Use caution when using heavy equipment or power tools. Avoid working on ladders or at heights. Take showers instead of baths. Ensure the water temperature is not too high on the home water heater. Do not go swimming alone. Do not lock yourself in a room alone (i.e. bathroom). When caring for infants or small children, sit down when holding, feeding, or changing them to minimize risk of injury to the child in the event you have a seizure. Maintain good sleep hygiene. Avoid alcohol.   If you had Gastrointestinal Bleeding: Please ask your Primary MD to check a complete blood count within one week of discharge or at your next visit. Your endoscopic/colonoscopic biopsies that are pending at the time of discharge, will also need to followed by your Primary MD.  Get Medicines reviewed and adjusted. Please take all your medications with you for your next visit with your Primary MD  Please request your Primary MD to go over all hospital tests and procedure/radiological results at the follow up, please ask your Primary MD to get all Hospital records sent to his/her office.  If you experience worsening of your admission symptoms, develop shortness of breath, life threatening emergency, suicidal or homicidal thoughts you must seek medical attention immediately by calling 911 or calling your MD immediately  if symptoms less severe.  You must read complete instructions/literature along with all the possible adverse reactions/side effects for all the Medicines you take  and that have been prescribed to you. Take any new Medicines after you have completely understood and accpet all the possible adverse reactions/side effects.   Do not drive or operate heavy machinery when taking Pain medications.   Do not take more than prescribed Pain, Sleep and Anxiety Medications  Special Instructions: If you have smoked or chewed Tobacco  in the last 2 yrs please stop smoking, stop any regular  Alcohol  and or any Recreational drug use.  Wear Seat belts while driving.  Please note You were cared for by a hospitalist during your hospital stay. If you have any questions about your discharge medications or the care you received while you were in the hospital after you are discharged, you can call the unit and asked to speak with the hospitalist on call if the hospitalist that took care of you is not available. Once you are discharged, your primary care physician will handle any further medical issues. Please note that NO REFILLS for any discharge medications will be authorized once you are discharged, as it is imperative that you return to your primary care physician (or establish a relationship with a primary care physician if you do not have one) for your aftercare needs so that they can reassess your need for medications and monitor your lab values.  You can reach the hospitalist office at phone [phone] or fax [phone]   If you do not have a primary care physician, you can call [phone] for a physician referral.  Activity: As tolerated with Full fall precautions use walker/cane & assistance as needed    Diet: regular  Disposition Home

## 2023-10-20 NOTE — Plan of Care (Signed)

## 2023-10-20 NOTE — Discharge Summary (Signed)
 Physician Discharge Summary  Connor Cuevas FMW:[id number] DOB: Jul 07, 1940 DOA: 10/16/2023  PCP: Seabron Lenis, MD  Admit date: 10/16/2023 Discharge date: 10/20/2023  Admitted From: home Disposition:  home  Recommendations for Outpatient Follow-up:  Follow up with PCP in 1-2 weeks  Home Health: none Equipment/Devices: none  Discharge Condition: stable CODE STATUS: Full code Diet Orders (From admission, onward)     Start     Ordered   10/16/23 1053  Diet heart healthy/carb modified Room service appropriate? Yes; Fluid consistency: Thin  Diet effective now       Question Answer Comment  Diet-HS Snack? Nothing   Room service appropriate? Yes   Fluid consistency: Thin      10/16/23 1054            Brief Narrative / Interim history: 83 year old male with history of COPD, ongoing tobacco use, ILD who comes into the hospital with dyspnea increased wheezing for the past week.  He was using his home inhalers without improvement, received some prednisone  and azithromycin but overall had persistent symptoms and came to the hospital.  He was admitted with COPD exacerbation  Hospital Course / Discharge diagnoses: Principal Problem:   COPD exacerbation (HCC) Active Problems:   CAD (coronary artery disease)   Tobacco abuse   GERD (gastroesophageal reflux disease)   Principal problem COPD exacerbation -patient was admitted to the hospital with COPD exacerbation, wheezing, shortness of breath, tachypnea.  He was placed on nebulizers, steroids, empiric antibiotics due to sinus pressure and postnasal drip, with improvement in his symptoms.  His wheezing improved significantly, now minimal if any, able to ambulate on room air and much improved.  He will be placed on a prednisone  taper, was set up with a nebulizer machine at home as he does not have one, and will receive antibiotics for few additional days.  He will be discharged home in stable condition, and he will temporarily live with  his son after this hospitalization.   Active problems Tobacco use-determined to quit History of CAD-with stenting in the past, continue home regimen, no chest pain, this is stable.  He takes Plavix  alone, no longer take aspirin  due to easy bruising, per his PCP.  Continue rest of the home medications  Essential hypertension-continue home medications as below, blood pressure is stable  Sepsis ruled out   Discharge Instructions   Allergies as of 10/20/2023       Reactions   Cefaclor Anaphylaxis, Rash, Swelling   Throat closed   Nitrofurantoin Anaphylaxis   Right kidney shut down macrobid   Fluticasone    Other Reaction(s): nose bleeds   Nsaids Other (See Comments)   Due to blood thinner and asa use Other Reaction(s): make blood to thin   Rosuvastatin  Other (See Comments)   Varenicline Other (See Comments)   nightmares Other Reaction(s): nightmares, depression        Medication List     STOP taking these medications    Anoro Ellipta 62.5-25 MCG/ACT Aepb Generic drug: umeclidinium-vilanterol   aspirin  EC 81 MG tablet       TAKE these medications    amLODipine  5 MG tablet Commonly known as: NORVASC  Take 5 mg by mouth daily.   atorvastatin  80 MG tablet Commonly known as: LIPITOR  Take 1 tablet (80 mg total) by mouth every morning.   Breo Ellipta 100-25 MCG/ACT Aepb Generic drug: fluticasone furoate-vilanterol Inhale 1 puff into the lungs daily.   brimonidine  0.2 % ophthalmic solution Commonly known as: ALPHAGAN  Place 1  drop into both eyes 3 (three) times daily.   cholecalciferol  25 MCG (1000 UNIT) tablet Commonly known as: VITAMIN D3 Take 1,000 Units by mouth daily.   clopidogrel  75 MG tablet Commonly known as: PLAVIX  Take 1 tablet (75 mg total) by mouth daily.   Co Q 10 100 MG Caps Take 100 mg by mouth daily.   dorzolamide -timolol  2-0.5 % ophthalmic solution Commonly known as: COSOPT  1 drop 3 (three) times daily.   DSS 100 MG Caps as needed.    GARLIC PO Take 1 tablet by mouth daily.   ICAPS AREDS FORMULA PO Take 1 tablet by mouth 2 (two) times daily.   ipratropium-albuterol  0.5-2.5 (3) MG/3ML Soln Commonly known as: DUONEB Take 3 mLs by nebulization every 4 (four) hours as needed.   levofloxacin  750 MG tablet Commonly known as: LEVAQUIN  Take 1 tablet (750 mg total) by mouth daily for 2 days.   metoprolol  succinate 25 MG 24 hr tablet Commonly known as: TOPROL -XL Take 0.5 tablets (12.5 mg total) by mouth daily. What changed: how much to take   multivitamin tablet Take 1 tablet by mouth daily.   nitroGLYCERIN  0.4 MG SL tablet Commonly known as: NITROSTAT  Place 1 tablet (0.4 mg total) under the tongue every 5 (five) minutes as needed for chest pain.   predniSONE  10 MG tablet Commonly known as: DELTASONE  Take 4 tablets (40 mg total) by mouth daily with breakfast for 2 days, THEN 3 tablets (30 mg total) daily with breakfast for 2 days, THEN 2 tablets (20 mg total) daily with breakfast for 2 days, THEN 1 tablet (10 mg total) daily with breakfast for 2 days. Start taking on: October 20, 2023   ProAir  HFA 108 (90 Base) MCG/ACT inhaler Generic drug: albuterol  Inhale 2 puffs into the lungs every 4 (four) hours as needed for wheezing.   pseudoephedrine 120 MG 12 hr tablet Commonly known as: SUDAFED Take 120 mg by mouth daily as needed for congestion.   tamsulosin  0.4 MG Caps capsule Commonly known as: FLOMAX  Take 0.4 mg by mouth daily.   traZODone  100 MG tablet Commonly known as: DESYREL  Take 100 mg by mouth at bedtime.               Durable Medical Equipment  (From admission, onward)           Start     Ordered   10/19/23 0840  For home use only DME Nebulizer machine  Once       Question Answer Comment  Patient needs a nebulizer to treat with the following condition COPD (chronic obstructive pulmonary disease) (HCC)   Length of Need Lifetime   Additional equipment included Administration kit    Additional equipment included Filter      10/19/23 0839            Follow-up Information     Seabron Lenis, MD Follow up.   Specialty: Family Medicine Why: Call the office and schedule a hospital follow up in the next 7-10 days. Contact information: [address] [place] [address] [phone]         [facility], [facility] Follow up.   Why: [facility] will provide a nebulizer machin to your hospital room before discharging home. Contact information: [address] [place] [address] [phone]                Consultations: none  Procedures/Studies:  DG Chest 2 View Result Date: 10/16/2023 CLINICAL DATA:  83 year old  male with weakness, shortness of breath, chest heaviness. EXAM: CHEST - 2 VIEW COMPARISON:  CTA chest 12/28/2020 and earlier. FINDINGS: PA and lateral views 0814 hours. Large lung volumes with emphysema confirmed on 2022 CTA. Chronic right upper lobe lung nodule (image #1 arrow), radiographically stable since that time. Mediastinal contour stable with mild aortic tortuosity. Visualized tracheal air column is within normal limits. No pneumothorax, pulmonary edema, pleural effusion or acute lung opacity. Partially visible abdominal aortic endograft. Osteopenia. No acute osseous abnormality identified. IMPRESSION: 1. No acute cardiopulmonary abnormality. 2. Emphysema (ICD10-J43.9). 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 Electronically Signed   By: VEAR Hurst M.D.   On: 10/16/2023 08:42     Subjective: - no chest pain, shortness of breath, no abdominal pain, nausea or vomiting.   Discharge Exam: BP 139/87 (BP Location: Left Arm)   Pulse 81   Temp 97.6 F (36.4 C)   Resp 17   Ht 5' 7 (1.702 m)   Wt 58.8 kg   SpO2 97%   BMI 20.30 kg/m   General: Pt is alert, awake, not in acute distress Cardiovascular: RRR, S1/S2 +, no rubs, no gallops Respiratory: CTA bilaterally, no wheezing, no  rhonchi Abdominal: Soft, NT, ND, bowel sounds + Extremities: no edema, no cyanosis    The results of significant diagnostics from this hospitalization (including imaging, microbiology, ancillary and laboratory) are listed below for reference.     Microbiology: Recent Results (from the past 240 hours)  Respiratory (~20 pathogens) panel by PCR     Status: None   Collection Time: 10/16/23 10:52 AM   Specimen: Nasopharyngeal Swab; Respiratory  Result Value Ref Range Status   Adenovirus NOT DETECTED NOT DETECTED Final   Coronavirus 229E NOT DETECTED NOT DETECTED Final    Comment: (NOTE) The Coronavirus on the Respiratory Panel, DOES NOT test for the novel  Coronavirus (2019 nCoV)    Coronavirus HKU1 NOT DETECTED NOT DETECTED Final   Coronavirus NL63 NOT DETECTED NOT DETECTED Final   Coronavirus OC43 NOT DETECTED NOT DETECTED Final   Metapneumovirus NOT DETECTED NOT DETECTED Final   Rhinovirus / Enterovirus NOT DETECTED NOT DETECTED Final   Influenza A NOT DETECTED NOT DETECTED Final   Influenza B NOT DETECTED NOT DETECTED Final   Parainfluenza Virus 1 NOT DETECTED NOT DETECTED Final   Parainfluenza Virus 2 NOT DETECTED NOT DETECTED Final   Parainfluenza Virus 3 NOT DETECTED NOT DETECTED Final   Parainfluenza Virus 4 NOT DETECTED NOT DETECTED Final   Respiratory Syncytial Virus NOT DETECTED NOT DETECTED Final   Bordetella pertussis NOT DETECTED NOT DETECTED Final   Bordetella Parapertussis NOT DETECTED NOT DETECTED Final   Chlamydophila pneumoniae NOT DETECTED NOT DETECTED Final   Mycoplasma pneumoniae NOT DETECTED NOT DETECTED Final    Comment: Performed at [facility], [address], [place], [place] [address]  Group A Strep by PCR     Status: None   Collection Time: 10/17/23 10:11 AM   Specimen: Throat; Sterile Swab  Result Value Ref Range Status   Group A Strep by PCR NOT DETECTED NOT DETECTED Final    Comment: Performed at [facility], [address]  [place], [place] [address]     Labs: Basic Metabolic Panel: Recent Labs  Lab 10/16/23 0800 10/17/23 0330 10/18/23 0247  NA 138 140 138  K 4.1 4.1 3.8  CL 105 104 105  CO2 24 23 23   GLUCOSE 96 127* 111*  BUN 15 20  28*  CREATININE 0.94 1.00 0.89  CALCIUM  9.5 9.5 9.2  MG  --   --  2.3   Liver Function Tests: Recent Labs  Lab 10/17/23 0330 10/18/23 0247  AST 17 23  ALT 13 15  ALKPHOS 58 53  BILITOT 0.7 0.9  PROT 6.3* 6.3*  ALBUMIN 3.5 3.6   CBC: Recent Labs  Lab 10/16/23 0800 10/17/23 0330 10/18/23 0247  WBC 9.7 8.4 16.2*  HGB 16.1 15.0 15.2  HCT 50.6 46.2 46.2  MCV 93.0 89.4 88.8  PLT 231 265 275   CBG: Recent Labs  Lab 10/19/23 0924  GLUCAP 111*   Hgb A1c No results for input(s): HGBA1C in the last 72 hours. Lipid Profile No results for input(s): CHOL, HDL, LDLCALC, TRIG, CHOLHDL, LDLDIRECT in the last 72 hours. Thyroid function studies No results for input(s): TSH, T4TOTAL, T3FREE, THYROIDAB in the last 72 hours.  Invalid input(s): FREET3 Urinalysis    Component Value Date/Time   COLORURINE AMBER (A) 12/28/2020 1752   APPEARANCEUR CLOUDY (A) 12/28/2020 1752   LABSPEC 1.036 (H) 12/28/2020 1752   PHURINE 5.0 12/28/2020 1752   GLUCOSEU NEGATIVE 12/28/2020 1752   HGBUR LARGE (A) 12/28/2020 1752   BILIRUBINUR NEGATIVE 12/28/2020 1752   KETONESUR NEGATIVE 12/28/2020 1752   PROTEINUR 100 (A) 12/28/2020 1752   UROBILINOGEN 0.2 08/24/2011 0640   NITRITE NEGATIVE 12/28/2020 1752   LEUKOCYTESUR LARGE (A) 12/28/2020 1752    FURTHER DISCHARGE INSTRUCTIONS:   Get Medicines reviewed and adjusted: Please take all your medications with you for your next visit with your Primary MD   Laboratory/radiological data: Please request your Primary MD to go over all hospital tests and procedure/radiological results at the follow up, please ask your Primary MD to get all Hospital records sent to his/her office.   In some cases, they will be  blood work, cultures and biopsy results pending at the time of your discharge. Please request that your primary care M.D. goes through all the records of your hospital data and follows up on these results.   Also Note the following: If you experience worsening of your admission symptoms, develop shortness of breath, life threatening emergency, suicidal or homicidal thoughts you must seek medical attention immediately by calling 911 or calling your MD immediately  if symptoms less severe.   You must read complete instructions/literature along with all the possible adverse reactions/side effects for all the Medicines you take and that have been prescribed to you. Take any new Medicines after you have completely understood and accpet all the possible adverse reactions/side effects.    Do not drive when taking Pain medications or sleeping medications (Benzodaizepines)   Do not take more than prescribed Pain, Sleep and Anxiety Medications. It is not advisable to combine anxiety,sleep and pain medications without talking with your primary care practitioner   Special Instructions: If you have smoked or chewed Tobacco  in the last 2 yrs please stop smoking, stop any regular Alcohol  and or any Recreational drug use.   Wear Seat belts while driving.   Please note: You were cared for by a hospitalist during your hospital stay. Once you are discharged, your primary care physician will handle any further medical issues. Please note that NO REFILLS for any discharge medications will be authorized once you are discharged, as it is imperative that you return to your primary care physician (or establish a relationship with a primary care physician if you do not have one) for your post hospital discharge needs  so that they can reassess your need for medications and monitor your lab values.  Time coordinating discharge: 35 minutes  SIGNED:  Nilda Fendt, MD, PhD 10/20/2023, 8:39 AM

## 2023-11-06 DIAGNOSIS — H353221 Exudative age-related macular degeneration, left eye, with active choroidal neovascularization: Secondary | ICD-10-CM | POA: Diagnosis not present

## 2023-11-19 DIAGNOSIS — J441 Chronic obstructive pulmonary disease with (acute) exacerbation: Secondary | ICD-10-CM | POA: Diagnosis not present

## 2023-12-04 DIAGNOSIS — J441 Chronic obstructive pulmonary disease with (acute) exacerbation: Secondary | ICD-10-CM | POA: Diagnosis not present

## 2023-12-04 DIAGNOSIS — F419 Anxiety disorder, unspecified: Secondary | ICD-10-CM | POA: Diagnosis not present

## 2023-12-20 DIAGNOSIS — J441 Chronic obstructive pulmonary disease with (acute) exacerbation: Secondary | ICD-10-CM | POA: Diagnosis not present

## 2023-12-24 DIAGNOSIS — M1990 Unspecified osteoarthritis, unspecified site: Secondary | ICD-10-CM | POA: Diagnosis not present

## 2023-12-24 DIAGNOSIS — N2889 Other specified disorders of kidney and ureter: Secondary | ICD-10-CM | POA: Diagnosis not present

## 2023-12-24 DIAGNOSIS — Z955 Presence of coronary angioplasty implant and graft: Secondary | ICD-10-CM | POA: Diagnosis not present

## 2023-12-24 DIAGNOSIS — D72829 Elevated white blood cell count, unspecified: Secondary | ICD-10-CM | POA: Diagnosis not present

## 2023-12-24 DIAGNOSIS — F321 Major depressive disorder, single episode, moderate: Secondary | ICD-10-CM | POA: Diagnosis not present

## 2023-12-24 DIAGNOSIS — I10 Essential (primary) hypertension: Secondary | ICD-10-CM | POA: Diagnosis not present

## 2023-12-24 DIAGNOSIS — J449 Chronic obstructive pulmonary disease, unspecified: Secondary | ICD-10-CM | POA: Diagnosis not present

## 2023-12-24 DIAGNOSIS — F419 Anxiety disorder, unspecified: Secondary | ICD-10-CM | POA: Diagnosis not present

## 2023-12-24 DIAGNOSIS — E782 Mixed hyperlipidemia: Secondary | ICD-10-CM | POA: Diagnosis not present

## 2024-01-01 DIAGNOSIS — H353221 Exudative age-related macular degeneration, left eye, with active choroidal neovascularization: Secondary | ICD-10-CM | POA: Diagnosis not present

## 2024-01-02 ENCOUNTER — Ambulatory Visit

## 2024-01-15 NOTE — Progress Notes (Signed)
 New Patient Pulmonology Office Visit   Subjective:  Patient ID: Connor Cuevas, male    DOB: [birth year]  MRN: [id number]  Referred by: Laurice President, NP  CC: copd, dyspnea   HPI Connor Cuevas is a 83 y.o. male with history of COPD, current smoke who came for evaluation.  Patient report that he has been smoking since he was 83 years old, he is currently smoking 5 to 6 cigarettes a day. Prior he used to smoke a pack a day.  He has COPD that is overall stable, however this last year he had more frequent flares and 1 admission.  His flares are described as wheezing, coughing, but mainly shortness of breath.  He had flares in January, April, and July, receiving short course of prednisone  and antibiotics as outpatient. He was admitted on 10/2023 for COPD exacerbation. At baseline, he used to have 1 flare a year in average.   Interval history: Patient is able to do his daily activities like cleaning, cooking, however he needs to rest after 30 minutes.  This is new compared to last year that he was able to tolerate more hours of daily activities. He has at baseline wheezing, cough, and sputum production. He uses a flutter valve 3 times a day He uses albuterol  nebulizer 1-2 a day. He is aware of the lung nodule that he has been growing in size over time. His wife was patient of Dr. Shelah, however she passed away  due to COPD, in 02-09-22. He has a right blind eye.   ROS As above  Allergies: Cefaclor, Nitrofurantoin, Fluticasone, Nsaids, Rosuvastatin , and Varenicline  Current Outpatient Medications:    amLODipine  (NORVASC ) 5 MG tablet, Take 5 mg by mouth daily., Disp: , Rfl:    atorvastatin  (LIPITOR ) 80 MG tablet, Take 1 tablet (80 mg total) by mouth every morning., Disp: 90 tablet, Rfl: 3   brimonidine  (ALPHAGAN ) 0.2 % ophthalmic solution, Place 1 drop into both eyes 3 (three) times daily., Disp: , Rfl:    cholecalciferol  (VITAMIN D3) 25 MCG (1000 UNIT) tablet, Take 1,000 Units by mouth daily.,  Disp: , Rfl:    clopidogrel  (PLAVIX ) 75 MG tablet, Take 1 tablet (75 mg total) by mouth daily. (Patient not taking: Reported on 10/16/2023), Disp: 90 tablet, Rfl: 0   Coenzyme Q10 (CO Q 10) 100 MG CAPS, Take 100 mg by mouth daily., Disp: , Rfl:    Docusate Sodium  (DSS) 100 MG CAPS, as needed., Disp: , Rfl:    dorzolamide -timolol  (COSOPT ) 2-0.5 % ophthalmic solution, 1 drop 3 (three) times daily., Disp: , Rfl:    fluticasone furoate-vilanterol (BREO ELLIPTA) 100-25 MCG/ACT AEPB, Inhale 1 puff into the lungs daily., Disp: , Rfl:    GARLIC PO, Take 1 tablet by mouth daily., Disp: , Rfl:    ipratropium-albuterol  (DUONEB) 0.5-2.5 (3) MG/3ML SOLN, Take 3 mLs by nebulization every 4 (four) hours as needed., Disp: 360 mL, Rfl: 0   metoprolol  succinate (TOPROL -XL) 25 MG 24 hr tablet, Take 0.5 tablets (12.5 mg total) by mouth daily. (Patient taking differently: Take 25 mg by mouth daily.), Disp: 45 tablet, Rfl: 3   Multiple Vitamin (MULTIVITAMIN) tablet, Take 1 tablet by mouth daily., Disp: , Rfl:    Multiple Vitamins-Minerals (ICAPS AREDS FORMULA PO), Take 1 tablet by mouth 2 (two) times daily., Disp: , Rfl:    nitroGLYCERIN  (NITROSTAT ) 0.4 MG SL tablet, Place 1 tablet (0.4 mg total) under the tongue every 5 (five) minutes as needed for chest pain., Disp:  25 tablet, Rfl: 5   PROAIR  HFA 108 (90 Base) MCG/ACT inhaler, Inhale 2 puffs into the lungs every 4 (four) hours as needed for wheezing., Disp: , Rfl:    pseudoephedrine (SUDAFED) 120 MG 12 hr tablet, Take 120 mg by mouth daily as needed for congestion., Disp: , Rfl:    tamsulosin  (FLOMAX ) 0.4 MG CAPS capsule, Take 0.4 mg by mouth daily., Disp: , Rfl:    traZODone  (DESYREL ) 100 MG tablet, Take 100 mg by mouth at bedtime. , Disp: , Rfl:  No current facility-administered medications for this visit.  Facility-Administered Medications Ordered in Other Visits:    clindamycin  (CLEOCIN ) 900 mg in dextrose  5 % 50 mL IVPB, 900 mg, Intravenous, 60 min Pre-Op **AND**  gentamicin  (GARAMYCIN ) 290 mg in dextrose  5 % 50 mL IVPB, 5 mg/kg, Intravenous, 60 min Pre-Op, Debby Hila, MD Past Medical History:  Diagnosis Date   AAA (abdominal aortic aneurysm) 04/16/2018   4.3 cm, noted on US     Acute kidney failure 2005 approx   after ureteroscopy, hospitalized for about 10 days afterwards   Anxiety    Arthritis    Hands, wrist, elbow, back and spine oa   Bladder mass    Blind right eye since 07-2019   Bulging lumbar disc    L4-L5 degeneration, Chronic back pain   CAD (coronary artery disease)    a. 01/09/14: [place]  s/p DES to RCA    COPD (chronic obstructive pulmonary disease) (HCC)    wheezing   Dyspnea    with exertion   Glaucoma of both eyes    Hepatic cyst 2013   Several tiny hepatic cysts , noted on CT   History of kidney stones    small kidney stones in right kidney   Hyperlipidemia    Interstitial lung disease (HCC)    managed by pcp dr shelah swayne   Pneumonia yrs ago   Pulmonary nodule, right 08/02/2016   a. 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   Stenosis of iliac artery    a. Aortosclerosis, bilateral - mild with calcification. Per Dr. Seabron at [facility]    Tobacco abuse    Wears dentures    upper    Wears glasses    Past Surgical History:  Procedure Laterality Date   ABDOMINAL AORTIC ENDOVASCULAR STENT GRAFT  06/21/2020   Procedure: ABDOMINAL AORTIC ENDOVASCULAR STENT GRAFT;  Surgeon: Gretta Lonni PARAS, MD;  Location: [facility] OR;  Service: Vascular;;   BIOPSY  01/18/2021   Procedure: BIOPSY;  Surgeon: Dianna Specking, MD;  Location: [facility];  Service: Endoscopy;;  EGD and COLON   CARDIAC CATHETERIZATION  2017   right carotid stent   CATARACT EXTRACTION W/ INTRAOCULAR LENS  IMPLANT, BILATERAL  1997 (APPROX)   CATARACT EXTRACTION W/PHACO Left 03/31/2020   Procedure: CATARACT EXTRACTION PHACO AND INTRAOCULAR LENS PLACEMENT (IOC)  LEFT 5.51 01:16.1 7.2%;  Surgeon: Mittie Gaskin, MD;  Location: [facility];  Service: Ophthalmology;  Laterality: Left;   COLONOSCOPY  2017   COLONOSCOPY WITH PROPOFOL  N/A 01/18/2021   Procedure: COLONOSCOPY WITH PROPOFOL ;  Surgeon: Dianna Specking, MD;  Location: [facility];  Service: Endoscopy;  Laterality: N/A;   CYSTOSCOPY W/ URETERAL STENT PLACEMENT Bilateral 08/13/2020   Procedure: CYSTOSCOPY WITH BILATERAL RETROGRADE PYELOGRAM;  Surgeon: Selma Donnice SAUNDERS, MD;  Location: [facility];  Service: Urology;  Laterality: Bilateral;  CYSTOSCOPY/RETROGRADE/URETEROSCOPY/STONE EXTRACTION WITH BASKET  10/17/2011   Procedure: CYSTOSCOPY/RETROGRADE/URETEROSCOPY/STONE EXTRACTION WITH BASKET;  Surgeon: Thomasine Oiler, MD;  Location: [facility];  Service: Urology;  Laterality: Right;   CYSTOSCOPY/URETEROSCOPY/HOLMIUM LASER/STENT PLACEMENT Right 05/13/2018   Procedure: CYSTOSCOPY/RETROGRADE/URETEROSCOPY/HOLMIUM LASER;  Surgeon: Ottelin, Mark, MD;  Location: [facility];  Service: Urology;  Laterality: Right;   ESOPHAGOGASTRODUODENOSCOPY (EGD) WITH PROPOFOL  N/A 01/18/2021   Procedure: ESOPHAGOGASTRODUODENOSCOPY (EGD) WITH PROPOFOL ;  Surgeon: Dianna Specking, MD;  Location: [facility];  Service: Endoscopy;  Laterality: N/A;   HERNIA REPAIR  15 yrs ago   bil inguinal   LAPAROSCOPIC LEFT INGUINAL (OBTURATOR) HERNIA AND UMBILICAL HERNIA REPAIR  01-13-2011   LEFT HEART CATHETERIZATION WITH CORONARY ANGIOGRAM N/A 01/09/2014   Procedure: LEFT HEART CATHETERIZATION WITH CORONARY ANGIOGRAM;  Surgeon: Maude JAYSON Emmer, MD;  Location: [facility] CATH LAB;  Service: Cardiovascular;  Laterality: N/A;   LEFT URETEROSCOPIC STONE EXTRACTION  11-30-2000   W/ PREVIOUS ESWL AND URETERAL STENT PLACEMENT   PHOTOCOAGULATION Right 12/10/2019   Procedure: TRANSCLEARAL DIODE CYCLOPHOTOCOAGULATION RIGHT;  Surgeon: Mittie Gaskin, MD;  Location: [facility];  Service:  Ophthalmology;  Laterality: Right;   REPAIR RIGHT INGUINAL HERNIA W/ MESH  12-31-2000   SUBMUCOSAL TATTOO INJECTION  01/18/2021   Procedure: SUBMUCOSAL TATTOO INJECTION;  Surgeon: Dianna Specking, MD;  Location: [facility];  Service: Endoscopy;;   TONSILLECTOMY  age 83   TRANSURETHRAL RESECTION OF BLADDER TUMOR N/A 08/13/2020   Procedure: TRANSURETHRAL RESECTION OF BLADDER TUMOR (TURBT) WITH POSTOPERATIVE INSTILLATION OF GEMCITABINE ;  Surgeon: Selma Donnice SAUNDERS, MD;  Location: [facility];  Service: Urology;  Laterality: N/A;  GENERAL ANESTHESIA WITH INTUBATED, PARALYSIS   ULTRASOUND GUIDANCE FOR VASCULAR ACCESS Bilateral 06/21/2020   Procedure: ULTRASOUND GUIDANCE FOR VASCULAR ACCESS;  Surgeon: Gretta Lonni PARAS, MD;  Location: [facility] OR;  Service: Vascular;  Laterality: Bilateral;   URETEROLITHOTOMY  2009   Family History  Problem Relation Age of Onset   Hypertension Mother    Heart attack Other        uncle   Stroke Other    Stroke Maternal Grandmother    Stroke Maternal Grandfather    Stroke Paternal Grandmother    Stroke Paternal Grandfather    Social History   Socioeconomic History   Marital status: Married    Spouse name: Not on file   Number of children: Not on file   Years of education: Not on file   Highest education level: Not on file  Occupational History   Not on file  Tobacco Use   Smoking status: Former    Current packs/day: 0.00    Average packs/day: 1 pack/day for 53.0 years (53.0 ttl pk-yrs)    Types: Cigarettes    Start date: 03/11/1965    Quit date: 03/11/2018    Years since quitting: 5.8   Smokeless tobacco: Never  Vaping Use   Vaping status: Never Used  Substance and Sexual Activity   Alcohol use: No   Drug use: Never   Sexual activity: Yes  Other Topics Concern   Not on file  Social History Narrative   Not on file   Social Drivers of Health   Financial Resource Strain: Not on file  Food Insecurity: No Food Insecurity (10/16/2023)    Hunger Vital Sign    Worried About Running Out of Food in the Last Year: Never true    Ran Out of Food in the Last Year: Never true  Transportation Needs: No Transportation Needs (10/16/2023)   PRAPARE -  Administrator, Civil Service (Medical): No    Lack of Transportation (Non-Medical): No  Physical Activity: Not on file  Stress: Not on file  Social Connections: Moderately Isolated (10/16/2023)   Social Connection and Isolation Panel    Frequency of Communication with Friends and Family: More than three times a week    Frequency of Social Gatherings with Friends and Family: More than three times a week    Attends Religious Services: Never    Database administrator or Organizations: No    Attends Banker Meetings: 1 to 4 times per year    Marital Status: Widowed  Intimate Partner Violence: Not At Risk (10/16/2023)   Humiliation, Afraid, Rape, and Kick questionnaire    Fear of Current or Ex-Partner: No    Emotionally Abused: No    Physically Abused: No    Sexually Abused: No  Smoke 5 to 6 cigarettes a day.  Started smoking since he was 83 years old. He used to smoke 1 pack a day     Objective:  There were no vitals taken for this visit. SpO2 Readings from Last 3 Encounters:  01/16/24 97%  10/20/23 97%  11/13/22 95%    Physical Exam Constitutional:      Appearance: Normal appearance.  HENT:     Head: Normocephalic.     Nose: Nose normal.     Mouth/Throat:     Mouth: Mucous membranes are moist.  Eyes:     Extraocular Movements: Extraocular movements intact.     Pupils: Pupils are equal, round, and reactive to light.  Cardiovascular:     Rate and Rhythm: Normal rate and regular rhythm.  Pulmonary:     Effort: Pulmonary effort is normal.     Comments: No wheezing, no crackles. Lung sounds decreased throughout. Musculoskeletal:        General: Normal range of motion.  Neurological:     General: No focal deficit present.     Mental Status: He is  alert and oriented to person, place, and time. Mental status is at baseline.     Diagnostic Review:  Last CBC Lab Results  Component Value Date   WBC 16.2 (H) 10/18/2023   HGB 15.2 10/18/2023   HCT 46.2 10/18/2023   MCV 88.8 10/18/2023   MCH 29.2 10/18/2023   RDW 13.5 10/18/2023   PLT 275 10/18/2023      CXR 2025 1. No acute cardiopulmonary abnormality. 2. Emphysema (ICD10-J43.9). Chronic right upper lobe lung nodule.  CT 2022 No evidence of pulmonary embolus. 14 mm right upper lobe pulmonary nodule, slightly increased in size from 10 mm since 2013. This slow of growth over the last 9 years suggests a benign process.  PET 2010 11 mm right upper lobe pulmonary nodule which does not exhibit  increased metabolic activity.  Although this may represent a benign  nodule, a low grade neoplasm is not excluded in this patient with  emphysema and 36-month CT follow-up or sampling should be  considered.   Assessment & Plan:   Assessment & Plan COPD with chronic bronchitis and emphysema (HCC) Patient with COPD who had 3 exacerbations this year, requiring prednisone  and antibiotics.  He noticed more dyspnea on exertion this year with more flares.  His symptoms are wheezing, cough, sputum production.  He is compliance with the inhaler treatment.  Has been using Trelegy 100, albuterol  neb twice a day.  I will maximize COPD therapy to Trelegy 200, albuterol  nebulizer and flutter  valve for secretion clearance.  Orders:   albuterol  (PROVENTIL ) (2.5 MG/3ML) 0.083% nebulizer solution 2.5 mg   nicotine  (NICODERM CQ  - dosed in mg/24 hr) patch 14 mg Trelegy increased to 200 Flutter valve every 1-2 hours for secretions clearance Cigarette smoker He has been a smoker since 83 years old.  He used to smoke 1 pack a day and currently he smokes 5 to 6 cigarettes a day.  I highly encouraged him to decrease the number of cigarettes daily over time using the nicotine  patch. Continue nicotine  patch  daily for craving Lung nodule seen on imaging study Patient had a CT scan in 2022 with right upper lobe pulmonary nodule of 14 mm, slightly increased in size from 10 mm since 2013.  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  I have an extensive conversation about options of bronchoscopy biopsy and possible cancer treatments as chemo or radiation if that is the case. However, he would like to stop any following chest imaging, given that he does not wish to proceed with biopsies, cancer treatment options or surgeries in the future, given his age. Plan: Based on patient's wishes, I will stop f/u imaging for pulm nodule.      Return in about 4 months (around 05/18/2024).   Marny Patch, MD Pulmonary and Critical Care Medicine [facility]

## 2024-01-16 ENCOUNTER — Ambulatory Visit

## 2024-01-16 VITALS — BP 146/72 | HR 93 | Ht 67.5 in | Wt 131.8 lb

## 2024-01-16 DIAGNOSIS — R911 Solitary pulmonary nodule: Secondary | ICD-10-CM | POA: Diagnosis not present

## 2024-01-16 DIAGNOSIS — F17210 Nicotine dependence, cigarettes, uncomplicated: Secondary | ICD-10-CM | POA: Diagnosis not present

## 2024-01-16 DIAGNOSIS — J4489 Other specified chronic obstructive pulmonary disease: Secondary | ICD-10-CM | POA: Diagnosis not present

## 2024-01-16 MED ORDER — TRELEGY ELLIPTA 200-62.5-25 MCG/ACT IN AEPB: 1.0000 | INHALATION_SPRAY | Freq: Every day | RESPIRATORY_TRACT | 12 refills | Status: AC

## 2024-01-16 MED ORDER — ALBUTEROL SULFATE (2.5 MG/3ML) 0.083% IN NEBU: 2.5000 mg | INHALATION_SOLUTION | Freq: Four times a day (QID) | RESPIRATORY_TRACT | Status: AC | PRN

## 2024-01-16 MED ORDER — NICOTINE 7 MG/24HR TD PT24: 14.0000 mg | MEDICATED_PATCH | Freq: Every day | TRANSDERMAL | Status: AC

## 2024-01-16 NOTE — Patient Instructions (Signed)
 Dear Connor Cuevas;  For your COPD : -I changed Trelegy to the highest dose. Take one puff daily. -Use the albuterol  nebulizer if you have chest congestion in addition to the flutter valve. -The flutter valve you can use it every 2 hours to help with the secretions. -As we discussed you prefer not to do more CT scans related to the lung nodule, given that you wish not to proceed with any cancer treatments if this is the case.  -I recommend you to decrease the number of cigarettes a day, with nicotine  patches. -I will see you in 4 months.

## 2024-02-26 DIAGNOSIS — H353221 Exudative age-related macular degeneration, left eye, with active choroidal neovascularization: Secondary | ICD-10-CM | POA: Diagnosis not present

## 2024-04-11 ENCOUNTER — Other Ambulatory Visit: Payer: Self-pay | Admitting: Cardiovascular Disease

## 2024-04-29 ENCOUNTER — Ambulatory Visit

## 2024-05-09 ENCOUNTER — Other Ambulatory Visit: Payer: Self-pay | Admitting: Cardiovascular Disease

## 2024-07-02 ENCOUNTER — Ambulatory Visit
